# Patient Record
Sex: Female | Born: 1935 | Race: White | Hispanic: No | Smoking: Never smoker
Health system: Southern US, Community
[De-identification: ages and names within clinical notes are randomized; demographics above are authoritative.]

## PROBLEM LIST (undated history)

## (undated) DIAGNOSIS — I10 Essential (primary) hypertension: Secondary | ICD-10-CM

## (undated) DIAGNOSIS — I2510 Atherosclerotic heart disease of native coronary artery without angina pectoris: Secondary | ICD-10-CM

## (undated) DIAGNOSIS — H539 Unspecified visual disturbance: Secondary | ICD-10-CM

## (undated) DIAGNOSIS — I639 Cerebral infarction, unspecified: Secondary | ICD-10-CM

## (undated) DIAGNOSIS — T7840XA Allergy, unspecified, initial encounter: Secondary | ICD-10-CM

## (undated) DIAGNOSIS — D649 Anemia, unspecified: Secondary | ICD-10-CM

## (undated) DIAGNOSIS — H9190 Unspecified hearing loss, unspecified ear: Secondary | ICD-10-CM

## (undated) DIAGNOSIS — E039 Hypothyroidism, unspecified: Secondary | ICD-10-CM

## (undated) DIAGNOSIS — Z86010 Personal history of colonic polyps: Secondary | ICD-10-CM

## (undated) DIAGNOSIS — E119 Type 2 diabetes mellitus without complications: Secondary | ICD-10-CM

## (undated) DIAGNOSIS — E785 Hyperlipidemia, unspecified: Secondary | ICD-10-CM

## (undated) DIAGNOSIS — R251 Tremor, unspecified: Secondary | ICD-10-CM

## (undated) DIAGNOSIS — K219 Gastro-esophageal reflux disease without esophagitis: Secondary | ICD-10-CM

## (undated) HISTORY — PX: APPENDECTOMY: SHX54

## (undated) HISTORY — PX: CHOLECYSTECTOMY: SHX55

## (undated) HISTORY — DX: Essential (primary) hypertension: I10

## (undated) HISTORY — DX: Tremor, unspecified: R25.1

## (undated) HISTORY — DX: Unspecified visual disturbance: H53.9

## (undated) HISTORY — DX: Cerebral infarction, unspecified: I63.9

## (undated) HISTORY — DX: Anemia, unspecified: D64.9

## (undated) HISTORY — DX: Allergy, unspecified, initial encounter: T78.40XA

## (undated) HISTORY — DX: Personal history of colonic polyps: Z86.010

## (undated) HISTORY — PX: NECK SURGERY: SHX720

## (undated) HISTORY — PX: BACK SURGERY: SHX140

## (undated) HISTORY — PX: KNEE ARTHROSCOPY: SUR90

## (undated) HISTORY — DX: Type 2 diabetes mellitus without complications: E11.9

## (undated) HISTORY — DX: Gastro-esophageal reflux disease without esophagitis: K21.9

## (undated) HISTORY — PX: ABDOMINAL HYSTERECTOMY: SHX81

## (undated) HISTORY — PX: CARPAL TUNNEL RELEASE: SHX101

## (undated) HISTORY — DX: Unspecified hearing loss, unspecified ear: H91.90

## (undated) HISTORY — DX: Hyperlipidemia, unspecified: E78.5

---

## 1998-02-02 ENCOUNTER — Ambulatory Visit (HOSPITAL_COMMUNITY): Admission: RE | Admit: 1998-02-02 | Discharge: 1998-02-02 | Payer: Self-pay | Admitting: Family Medicine

## 1998-02-02 ENCOUNTER — Encounter: Payer: Self-pay | Admitting: Family Medicine

## 1998-11-26 ENCOUNTER — Emergency Department (HOSPITAL_COMMUNITY): Admission: EM | Admit: 1998-11-26 | Discharge: 1998-11-26 | Payer: Self-pay | Admitting: Emergency Medicine

## 1998-11-26 ENCOUNTER — Encounter: Payer: Self-pay | Admitting: Thoracic Surgery

## 1999-04-03 ENCOUNTER — Encounter: Payer: Self-pay | Admitting: Orthopedic Surgery

## 1999-04-03 ENCOUNTER — Inpatient Hospital Stay (HOSPITAL_COMMUNITY): Admission: RE | Admit: 1999-04-03 | Discharge: 1999-04-07 | Payer: Self-pay | Admitting: Orthopedic Surgery

## 1999-10-10 ENCOUNTER — Ambulatory Visit (HOSPITAL_COMMUNITY): Admission: RE | Admit: 1999-10-10 | Discharge: 1999-10-10 | Payer: Self-pay | Admitting: Family Medicine

## 1999-10-10 ENCOUNTER — Encounter: Payer: Self-pay | Admitting: Family Medicine

## 1999-12-06 ENCOUNTER — Ambulatory Visit (HOSPITAL_COMMUNITY): Admission: RE | Admit: 1999-12-06 | Discharge: 1999-12-06 | Payer: Self-pay | Admitting: Neurosurgery

## 1999-12-06 ENCOUNTER — Encounter: Payer: Self-pay | Admitting: Neurosurgery

## 1999-12-20 ENCOUNTER — Encounter: Payer: Self-pay | Admitting: Neurosurgery

## 1999-12-20 ENCOUNTER — Ambulatory Visit (HOSPITAL_COMMUNITY): Admission: RE | Admit: 1999-12-20 | Discharge: 1999-12-20 | Payer: Self-pay | Admitting: Neurosurgery

## 2000-01-03 ENCOUNTER — Encounter: Payer: Self-pay | Admitting: Neurosurgery

## 2000-01-03 ENCOUNTER — Ambulatory Visit (HOSPITAL_COMMUNITY): Admission: RE | Admit: 2000-01-03 | Discharge: 2000-01-03 | Payer: Self-pay | Admitting: Neurosurgery

## 2000-05-22 ENCOUNTER — Ambulatory Visit (HOSPITAL_BASED_OUTPATIENT_CLINIC_OR_DEPARTMENT_OTHER): Admission: RE | Admit: 2000-05-22 | Discharge: 2000-05-22 | Payer: Self-pay | Admitting: Orthopedic Surgery

## 2000-07-15 ENCOUNTER — Encounter: Admission: RE | Admit: 2000-07-15 | Discharge: 2000-08-15 | Payer: Self-pay | Admitting: Orthopedic Surgery

## 2000-10-25 ENCOUNTER — Ambulatory Visit (HOSPITAL_COMMUNITY): Admission: RE | Admit: 2000-10-25 | Discharge: 2000-10-25 | Payer: Self-pay

## 2000-10-25 ENCOUNTER — Encounter: Payer: Self-pay | Admitting: Family Medicine

## 2001-05-13 ENCOUNTER — Encounter: Payer: Self-pay | Admitting: Neurosurgery

## 2001-05-13 ENCOUNTER — Encounter: Admission: RE | Admit: 2001-05-13 | Discharge: 2001-05-13 | Payer: Self-pay | Admitting: Neurosurgery

## 2001-09-19 ENCOUNTER — Encounter: Payer: Self-pay | Admitting: Orthopedic Surgery

## 2001-09-19 ENCOUNTER — Emergency Department (HOSPITAL_COMMUNITY): Admission: EM | Admit: 2001-09-19 | Discharge: 2001-09-19 | Payer: Self-pay | Admitting: Emergency Medicine

## 2001-09-19 ENCOUNTER — Encounter: Payer: Self-pay | Admitting: Emergency Medicine

## 2001-11-19 ENCOUNTER — Ambulatory Visit (HOSPITAL_BASED_OUTPATIENT_CLINIC_OR_DEPARTMENT_OTHER): Admission: RE | Admit: 2001-11-19 | Discharge: 2001-11-19 | Payer: Self-pay | Admitting: *Deleted

## 2001-11-28 ENCOUNTER — Encounter: Payer: Self-pay | Admitting: Neurosurgery

## 2001-12-02 ENCOUNTER — Encounter: Payer: Self-pay | Admitting: Neurosurgery

## 2001-12-02 ENCOUNTER — Inpatient Hospital Stay (HOSPITAL_COMMUNITY): Admission: RE | Admit: 2001-12-02 | Discharge: 2001-12-06 | Payer: Self-pay | Admitting: Neurosurgery

## 2002-02-24 ENCOUNTER — Ambulatory Visit (HOSPITAL_BASED_OUTPATIENT_CLINIC_OR_DEPARTMENT_OTHER): Admission: RE | Admit: 2002-02-24 | Discharge: 2002-02-24 | Payer: Self-pay | Admitting: Urology

## 2003-02-07 ENCOUNTER — Emergency Department (HOSPITAL_COMMUNITY): Admission: AD | Admit: 2003-02-07 | Discharge: 2003-02-08 | Payer: Self-pay | Admitting: Emergency Medicine

## 2003-04-30 LAB — HM DEXA SCAN: HM Dexa Scan: NORMAL

## 2003-05-10 ENCOUNTER — Other Ambulatory Visit: Admission: RE | Admit: 2003-05-10 | Discharge: 2003-05-10 | Payer: Self-pay | Admitting: Family Medicine

## 2003-06-01 ENCOUNTER — Encounter: Admission: RE | Admit: 2003-06-01 | Discharge: 2003-07-13 | Payer: Self-pay | Admitting: Neurology

## 2003-10-10 ENCOUNTER — Emergency Department (HOSPITAL_COMMUNITY): Admission: EM | Admit: 2003-10-10 | Discharge: 2003-10-11 | Payer: Self-pay | Admitting: Emergency Medicine

## 2004-05-18 ENCOUNTER — Emergency Department (HOSPITAL_COMMUNITY): Admission: EM | Admit: 2004-05-18 | Discharge: 2004-05-18 | Payer: Self-pay | Admitting: Emergency Medicine

## 2004-09-05 ENCOUNTER — Ambulatory Visit (HOSPITAL_COMMUNITY): Admission: RE | Admit: 2004-09-05 | Discharge: 2004-09-05 | Payer: Self-pay | Admitting: Orthopedic Surgery

## 2004-09-07 ENCOUNTER — Ambulatory Visit (HOSPITAL_COMMUNITY): Admission: RE | Admit: 2004-09-07 | Discharge: 2004-09-07 | Payer: Self-pay | Admitting: Orthopedic Surgery

## 2004-09-20 ENCOUNTER — Ambulatory Visit (HOSPITAL_COMMUNITY): Admission: RE | Admit: 2004-09-20 | Discharge: 2004-09-20 | Payer: Self-pay | Admitting: Orthopedic Surgery

## 2004-09-20 ENCOUNTER — Ambulatory Visit (HOSPITAL_BASED_OUTPATIENT_CLINIC_OR_DEPARTMENT_OTHER): Admission: RE | Admit: 2004-09-20 | Discharge: 2004-09-20 | Payer: Self-pay | Admitting: Orthopedic Surgery

## 2004-10-04 ENCOUNTER — Ambulatory Visit (HOSPITAL_COMMUNITY): Admission: RE | Admit: 2004-10-04 | Discharge: 2004-10-04 | Payer: Self-pay | Admitting: Orthopedic Surgery

## 2004-11-15 ENCOUNTER — Ambulatory Visit: Admission: RE | Admit: 2004-11-15 | Discharge: 2004-11-15 | Payer: Self-pay | Admitting: Orthopedic Surgery

## 2004-11-20 ENCOUNTER — Encounter: Admission: RE | Admit: 2004-11-20 | Discharge: 2004-12-29 | Payer: Self-pay | Admitting: Orthopedic Surgery

## 2005-01-15 ENCOUNTER — Encounter: Admission: RE | Admit: 2005-01-15 | Discharge: 2005-01-15 | Payer: Self-pay | Admitting: Orthopedic Surgery

## 2005-02-19 ENCOUNTER — Inpatient Hospital Stay (HOSPITAL_COMMUNITY): Admission: RE | Admit: 2005-02-19 | Discharge: 2005-02-22 | Payer: Self-pay | Admitting: Orthopedic Surgery

## 2005-04-18 ENCOUNTER — Ambulatory Visit (HOSPITAL_BASED_OUTPATIENT_CLINIC_OR_DEPARTMENT_OTHER): Admission: RE | Admit: 2005-04-18 | Discharge: 2005-04-18 | Payer: Self-pay | Admitting: Orthopedic Surgery

## 2005-05-02 ENCOUNTER — Encounter: Admission: RE | Admit: 2005-05-02 | Discharge: 2005-05-21 | Payer: Self-pay | Admitting: Orthopedic Surgery

## 2005-06-12 ENCOUNTER — Encounter: Admission: RE | Admit: 2005-06-12 | Discharge: 2005-06-12 | Payer: Self-pay | Admitting: Orthopedic Surgery

## 2007-06-19 ENCOUNTER — Encounter: Admission: RE | Admit: 2007-06-19 | Discharge: 2007-06-19 | Payer: Self-pay | Admitting: Family Medicine

## 2007-10-07 ENCOUNTER — Encounter: Admission: RE | Admit: 2007-10-07 | Discharge: 2007-10-07 | Payer: Self-pay | Admitting: Orthopedic Surgery

## 2007-10-09 ENCOUNTER — Ambulatory Visit (HOSPITAL_BASED_OUTPATIENT_CLINIC_OR_DEPARTMENT_OTHER): Admission: RE | Admit: 2007-10-09 | Discharge: 2007-10-09 | Payer: Self-pay | Admitting: Orthopedic Surgery

## 2008-02-17 ENCOUNTER — Ambulatory Visit (HOSPITAL_BASED_OUTPATIENT_CLINIC_OR_DEPARTMENT_OTHER): Admission: RE | Admit: 2008-02-17 | Discharge: 2008-02-17 | Payer: Self-pay | Admitting: Orthopedic Surgery

## 2008-02-17 ENCOUNTER — Encounter (INDEPENDENT_AMBULATORY_CARE_PROVIDER_SITE_OTHER): Payer: Self-pay | Admitting: Orthopedic Surgery

## 2008-04-06 ENCOUNTER — Ambulatory Visit (HOSPITAL_COMMUNITY): Admission: RE | Admit: 2008-04-06 | Discharge: 2008-04-06 | Payer: Self-pay | Admitting: Neurosurgery

## 2008-10-08 ENCOUNTER — Encounter: Admission: RE | Admit: 2008-10-08 | Discharge: 2008-10-08 | Payer: Self-pay | Admitting: Family Medicine

## 2010-04-16 ENCOUNTER — Encounter: Payer: Self-pay | Admitting: Neurosurgery

## 2010-04-16 ENCOUNTER — Encounter: Payer: Self-pay | Admitting: Family Medicine

## 2010-06-30 LAB — HM PAP SMEAR

## 2010-07-03 ENCOUNTER — Encounter: Payer: Self-pay | Admitting: Nurse Practitioner

## 2010-07-03 DIAGNOSIS — E785 Hyperlipidemia, unspecified: Secondary | ICD-10-CM

## 2010-07-03 DIAGNOSIS — I10 Essential (primary) hypertension: Secondary | ICD-10-CM

## 2010-07-03 DIAGNOSIS — E1129 Type 2 diabetes mellitus with other diabetic kidney complication: Secondary | ICD-10-CM | POA: Insufficient documentation

## 2010-07-03 DIAGNOSIS — R251 Tremor, unspecified: Secondary | ICD-10-CM | POA: Insufficient documentation

## 2010-07-03 DIAGNOSIS — R32 Unspecified urinary incontinence: Secondary | ICD-10-CM | POA: Insufficient documentation

## 2010-07-03 DIAGNOSIS — E119 Type 2 diabetes mellitus without complications: Secondary | ICD-10-CM

## 2010-07-03 DIAGNOSIS — J309 Allergic rhinitis, unspecified: Secondary | ICD-10-CM | POA: Insufficient documentation

## 2010-07-03 DIAGNOSIS — K219 Gastro-esophageal reflux disease without esophagitis: Secondary | ICD-10-CM

## 2010-07-03 LAB — HM DIABETES FOOT EXAM: HM Diabetic Foot Exam: NORMAL

## 2010-08-08 NOTE — Op Note (Signed)
NAME:  Leah Levine, Leah Levine             ACCOUNT NO.:  [id number]   MEDICAL RECORD NO.:  [id number]          PATIENT TYPE:  AMB   LOCATION:  DSC                          FACILITY:  [facility]   PHYSICIAN:  Cindee Salt, M.D.       DATE OF BIRTH:  24-Dec-1932   DATE OF PROCEDURE:  DATE OF DISCHARGE:                               OPERATIVE REPORT   PREOPERATIVE DIAGNOSIS:  Mucoid cyst, left thumb.   POSTOPERATIVE DIAGNOSIS:  Mucoid cyst, left thumb.   OPERATIONS:  Excision cyst, debridement of IP joint, degenerative  arthritis, left thumb.   SURGEON:  Cindee Salt, MD   ASSISTANT:  RN   ANESTHESIA:  Forearm based IV regional.   DATE OF OPERATION:  February 17, 2008.   ANESTHESIOLOGISTJean Rosenthal.   HISTORY:  The patient is a 77 year old female with a history of a mass  over the dorsal-radial aspect of the IP joint of her left thumb.  She is  desirous of excision.  She is aware of risks and complications including  infection, recurrence, injury to arteries, nerves, tendons, incomplete  relief of symptoms, dystrophy, the possibility of recurrence, as long as  degenerative changes are present.  The cyst is small and does  transilluminate.  The patient is seen in the preoperative area.  The  extremity marked by both the patient and surgeon.  Antibiotic given.   PROCEDURE:  The patient was brought to the operating room where forearm  based IV regional anesthetic was carried out without difficulty under  the direction of Dr. Jean Rosenthal.  She was prepped using DuraPrep in supine  position, left arm free.  A time-out was taken.  A curvilinear incision  was made over the IP joint of the thumb, carried down through the  subcutaneous tissue.  The small cyst was immediately encountered.  This  was resected and sent to pathology, it was on the margin of the extensor  tendon.  The joint was opened.  Large osteophyte was present across the  entire dorsal aspect of the proximal phalanx.  This was removed with  a  small rongeur.  The joint was debrided.  Specimen was sent to pathology.  No further lesions were identified.  The wound was irrigated.  Skin  closed with interrupted 5-0 Vicryl Rapide sutures.  A sterile  compressive dressing and splint to the thumb applied.  The patient  tolerated the procedure well and was taken to the recovery room for  observation in satisfactory condition.  She will be discharged home to  return to the [facility] in [place] in 1 week on Vicodin.           ______________________________  Cindee Salt, M.D.     GK/MEDQ  D:  02/17/2008  T:  02/18/2008  Job:  [id number]   cc:   Ernestina Penna, M.D.

## 2010-08-08 NOTE — Op Note (Signed)
NAME:  Leah Levine, Leah Levine             ACCOUNT NO.:  [id number]   MEDICAL RECORD NO.:  [id number]          PATIENT TYPE:  AMB   LOCATION:  DSC                          FACILITY:  [facility]   PHYSICIAN:  Cindee Salt, M.D.       DATE OF BIRTH:  16-Dec-1932   DATE OF PROCEDURE:  10/09/2007  DATE OF DISCHARGE:                               OPERATIVE REPORT   PREOPERATIVE DIAGNOSIS:  Stenosing tenosynovitis, left thumb.   POSTOPERATIVE DIAGNOSIS:  Stenosing tenosynovitis, left thumb.   OPERATION:  Release of A1 pulley, left thumb.   SURGEON:  Cindee Salt, MD   ASSISTANT:  Joaquin Courts, RN   ANESTHESIA:  Forearm-based IV regional.   ANESTHESIOLOGIST:  Bedelia Person, MD   HISTORY:  The patient is a 77 year old female with a history of  triggering of her left thumb.  She has had multiple digits released in  the past.  She is desirous of conservative treatment and that this has  not worked in the past for her.  She is desirous of proceeding to  surgical release.   Postoperative course has been discussed along with the risks and  complications.  She is aware there is no guarantee with the surgery;  possibility of infection; recurrence injury to arteries, nerves,  tendons; incomplete relief of symptoms; and dystrophy.  In the  preoperative area, the patient is seen.  The extremity marked by both  the patient and surgeon.  Antibiotic given.   PROCEDURE:  The patient was brought to the operating room where a  forearm-based IV regional anesthetic was carried out without difficulty.  She was prepped using DuraPrep, supine position, left arm free.  A time-  out was taken.  A transverse incision was made over the A1 pulley of the  left thumb and carried down through the subcutaneous tissue.  Neurovascular structures were protected.  A1 pulley was identified.  This was released in its radial aspect, thumb placed through full range  motion.  No further triggering was evident.  The wound was irrigated.  The  skin was closed with interrupted 5-0 Vicryl Rapide sutures.  Sterile  compressive dressing in thumb, and no splint was applied.  The patient  tolerated the procedure well and was taken to the recovery room for  observation in satisfactory condition.  She will be discharged home to  return to the hand center of [facility] in 1 week on Vicodin.           ______________________________  Cindee Salt, M.D.    GK/MEDQ  D:  10/09/2007  T:  10/10/2007  Job:  [id number]
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
## 2010-08-11 NOTE — Discharge Summary (Signed)
[place]  Patient:    Leah Levine                     MRN: [id number] Adm. Date:  [id number] Disc. Date: [id number] Attending:  Georgena Levine Dictator:   Leah Levine, P.A. CC:         Leah Levine, M.D., [place], [place] [phone])                           Discharge Summary  ADMISSION DIAGNOSES: 1. End-stage osteoarthritis of the right knee. 2. Hypothyroidism. 3. Urinary frequency and urgency. 4. Hypercholesterolemia. 5. History of bilateral hand tremors.  DISCHARGE DIAGNOSES: 1. End-stage osteoarthritis of the right knee. 2. Hypothyroidism. 3. Urinary frequency and urgency. 4. Hypercholesterolemia. 5. History of bilateral hand tremors.  PROCEDURES:  On April 03, 1999, Leah Levine underwent a right total knee arthroplasty by Leah Levine. Lucey.  COMPLICATIONS:  None.  CONSULTATIONS: 1. Pharmacy consult for Coumadin therapy April 03, 1999. 2. Case management, physical therapy, and rehabilitation medicine consult April 04, 1999. 3. Occupational therapy consult April 05, 1999.  HISTORY OF PRESENT ILLNESS:  This 77 year old white female presented to Leah Levine with a one-year history of progressively worsening right knee pain.  The pain is constant and described as a dull ache, becoming sharp and knife-like at times. It increases with prolonged standing or weightbearing and decreases with resting or elevation.  The knee does swell at times, and does have a catching sensation, and she has had no relief of pain with a right knee arthroscopy by Leah Levine in September 2000.  The pain has continued to get worse, and has been unrelieved by conservative management, so she is presenting for a right total knee arthroplasty at this time.  HOSPITAL COURSE:  Leah Levine tolerated her surgical procedure well, without immediate postoperative complications.  She was subsequently transferred to 4700. On postoperative day #1,  she was afebrile with her vital signs stable. Dressing was intact to her right knee with minimal drainage.  Her Hemovac was discontinued. She was started on PT per protocol.  Her hemoglobin was 11.8 with hematocrit of 34. Epidural catheter was treating her pain well.  On postoperative day #2, she continued to do well.  Her t-max was 100.8, and her vital signs were stable.  Her right knee incision was well approximated with staples.  No erythema or drainage noted.  Negative Homans sign.  Her epidural was discontinued, and she was started on p.o. pain medications, and continued on PT per protocol.  She continued to make good progress over the next several days, with her hemoglobin and hematocrit remaining stable.  Her t-max on April 07, 1999, was  101.7.  She was afebrile that morning.  At that time, she was believed to be ready for discharge home but, because of the fever, a urinalysis was obtained at that  time to rule out a urinary tract infection.  That was negative.  Because of this, she was able to go home later that day.  DISCHARGE INSTRUCTIONS: 1. She was to resume all prehospitalization medications and diet. 2. She was also given OxyContin 10 mg 1 tablet p.o. q.12h., OxyIR p.r.n. for    breakthrough pain, and Coumadin with the dose to be determined by pharmacy. 3. She was to be weightbearing as tolerated on the right leg with use of the    walker. 4. She  was to follow up with Leah Levine in our office next week, and was to call    [phone] for that appointment. 5. She was arranged for home health physical therapy and an R.N. to draw her    pro times for Coumadin therapy. 6. She was to notify Leah Levine of a temperature greater than or equal to 101.5,  chills, pain unrelieved by pain medications, or foul-smelling drainage from the    wound.  She stated good understanding of these instructions and was subsequently discharged to home.  LABORATORY DATA:  Chest x-ray  taken on April 03, 1999, showed a small nodule within the left mid to upper lung zone which is stable when compared with a 1998 study.  It is consistent with a granuloma.  There is no active chest disease noted. Postoperative x-ray of her right knee on April 03, 1999, showed anatomic alignment status post the right knee arthroplasty.  On March 30, 1999, her white count was 6.6 with a hemoglobin of 13.2, hematocrit of 38.3.  On April 04, 1999, her white count was 11.4 with a hemoglobin of 11.8, hematocrit of 34.  On April 05, 1999, her white count was 9.9 with a hemoglobin of 10.7 and hematocrit of 31.5.  On April 06, 1999, her hemoglobin was 10.2 with hematocrit of 29.7.  On March 30, 1999, her PT was 14 seconds with an INR of 1.2 and a PTT of 26. n April 07, 1999, her PT was 19.6 seconds with an INR of 2.2.  Urinalysis done n April 07, 1999, showed 1 mg/dl of urobilinogen, trace leukocyte esterase, and  many epithelial cells.  All other indices were within normal limits. DD:  05/09/99 TD:  05/09/99 Job: [id number]

## 2010-08-11 NOTE — Op Note (Signed)
NAME:  Leah Levine, Leah Levine             ACCOUNT NO.:  [id number]   MEDICAL RECORD NO.:  [id number]          PATIENT TYPE:  AMB   LOCATION:  DSC                          FACILITY:  [facility]   PHYSICIAN:  Mila Homer. Sherlean Foot, M.D. DATE OF BIRTH:  29-Mar-1932   DATE OF PROCEDURE:  09/20/2004  DATE OF DISCHARGE:                                 OPERATIVE REPORT   SURGEON:  Mila Homer. Sherlean Foot, M.D.   ASSISTANT:  None.   ANESTHESIA:  General.   PREOPERATIVE DIAGNOSIS:  Left knee medial meniscus tear and osteoarthritis.   POSTOPERATIVE DIAGNOSIS:  Left knee medial meniscus tear and osteoarthritis.   PROCEDURE:  Left knee arthroscopy with partial medial meniscectomy and  chondroplasty of the patellofemoral and medial compartments.   INDICATIONS FOR PROCEDURE:  The patient is a 77 year old white female with  failure of conservative measures plus mechanical symptoms and MRI evidence  of a meniscus tear. Informed consent was obtained.   DESCRIPTION OF PROCEDURE:  The patient was laid supine, administered general  anesthesia. The left lower extremity was prepped and draped in the usual  sterile fashion. Inferolateral and inferomedial portals were created with a  #11 blade, blunt trocar and cannula. Diagnostic arthroscopy revealed some  grade 3 changes of the patella centrally and in the trochlea. I did a  chondroplasty of both areas with a 4.2 mm great white shaver. I then went  into the medial compartment and there was a large radial posterior horn  tear. Straight basket forceps and a great white shaver were used to perform  an aggressive posterior partial medial meniscectomy. The lateral compartment  was normal except for some grade 3 arthritis on the tibia. I did a  chondroplasty here as well. The ACL and PCL appeared normal. I then  irrigated and closed with interrupted 4-0 nylon sutures.   DRESSINGS:  Sponges, sterile Webril and Ace wrap.   COMPLICATIONS:  None.   DRAINS:  None.       SDL/MEDQ  D:  09/20/2004  T:  09/20/2004  Job:  [id number]

## 2010-08-11 NOTE — Op Note (Signed)
[place]  Patient:    Leah Levine                     MRN: [id number] Proc. Date: 04/03/99 Adm. Date:  [id number] Attending:  Georgena Spurling CC:         Anesthesia Department                           Operative Report  PREOPERATIVE DIAGNOSIS:  Degenerative joint disease of the knee.  POSTOPERATIVE DIAGNOSIS:  Degenerative joint disease of the knee.  OPERATIVE PROCEDURE:  Total knee replacement performed by Georgena Spurling, M.D.  SURGEON:  Burna Forts, M.D.  ASSISTANT:  INDICATIONS:  Consultation for placement of the epidural catheter for postoperative analgesia.  Preoperatively the risks and benefits of placement of the epidural catheter for postoperative analgesia were discussed in detail with the patient,  including risk of bleeding, infection, nerve injury, or other.  The patient also desired to have a regional technique.  Therefore, we plan a combination technique. Spinal anesthetic would be started at the beginning of the case following by epidural insertion for postoperative analgesia.  She also consented to this.  DESCRIPTION OF PROCEDURE:  The patient was taken to the operating room, turned n the right side, and sedation begun.  Local anesthesia was used.  The spinal anesthetic was obtained at the L4-5 level followed by an epidural placement at he L3-4 level without any problems with a loss of resistance technique and the catheter threaded with ease.  The surgery was maintained throughout and for a sufficient period of time with the spinal anesthetic.  We will follow the patient daily by the department of anesthesiology for postoperative analgesia with the epidural catheter.  COMPLICATIONS:  None.  DISPOSITION:  The patient will be followed daily by the department of anesthesiology for postoperative analgesia via epidural catheter. DD:  04/03/99 TD:  04/03/99 Job: [id number]

## 2010-08-11 NOTE — Op Note (Signed)
[facility]  Patient:    Leah Levine, Leah Levine                       MRN: [id number] Proc. Date: 05/22/00 Adm. Date:  [id number] Attending:  Georgena Spurling                           Operative Report  PREOPERATIVE DIAGNOSIS:  Left knee osteoarthritis.  POSTOPERATIVE DIAGNOSIS:  Left knee osteoarthritis.  PROCEDURE:  Left knee arthroscopy with chondroplasty of the medial and patellofemoral compartments and microfracture of the medial compartment.  SURGEON:  Georgena Spurling, M.D.  ANESTHESIA:  MAC.  INDICATION FOR PROCEDURE:  Patient is a 77 year old white female with osteoarthritis of the knee in spite of conservative treatment and mechanical symptoms.  After informed consent was obtained, she was taken to the operating room.  DESCRIPTION OF PROCEDURE:  The patient was laid supine after administering a knee block.  The left lower extremity was prepped and draped in the usual sterile fashion.  Inferolateral and inferomedial portals were created with a #11 blade, blunt trocar, and cannula.  Diagnostic arthroscopy was performed, revealing grade 4 chondromalacia of the patellofemoral joint as well as a large medial patellofemoral plica.  There was grade 4 chondromalacia on the medial femoral condyle, tibial plateau, and some grade 4 changes in the anterior portion of the lateral tibial plateau.  The menisci were, in fact, normal.  With a Great White shaver in the inferomedial portal, a chondroplasty of the medial compartment and the patellofemoral compartment were done as well as a debridement of the medial plica.  We then used the microfracture picks to pick the medial femoral condylar lesion approximately eight times down to bleeding bone.  We then used the Automatic Data shaver to remove any cartilaginous debris.  We lavaged the joint, removed the fluid and instrumentation, and closed the portals with interrupted 4-0 nylon sutures, dressed with Xeroform,  dressing sponges, sterile Webril, and Ace wrap.  TOURNIQUET:  None.  COMPLICATIONS:  None.  DRAINS:  None. DD:  05/22/00 TD:  05/23/00 Job: [id number]

## 2010-08-11 NOTE — Op Note (Signed)
NAME:  Leah Levine, Leah Levine             ACCOUNT NO.:  [id number]   MEDICAL RECORD NO.:  [id number]          PATIENT TYPE:  AMB   LOCATION:  DSC                          FACILITY:  [facility]   PHYSICIAN:  Mila Homer. Sherlean Foot, M.D. DATE OF BIRTH:  03-19-1933   DATE OF PROCEDURE:  04/18/2005  DATE OF DISCHARGE:                                 OPERATIVE REPORT   PREOPERATIVE DIAGNOSIS:  Arthrofibrosis status post total knee arthroplasty.   SURGEON:  Mila Homer. Sherlean Foot, M.D.   ASSISTANT:  None.   ANESTHESIA:  General mask plus femoral nerve block.   INDICATIONS FOR PROCEDURE:  Patient is seven weeks status post left total  knee arthroplasty.  She ended up having difficulty after therapy and the  knee began to freeze up.  She then consented for manipulation.  Informed  consent was obtained.   DESCRIPTION OF PROCEDURE:  Patient was layed supine and administered mask  anesthesia.  Left leg was manipulated from 0 to approximately 50 degrees to  0 to 120 degrees.  This was very, very easy and the manipulation was supple.  She had good drop and dangle back to 115 to 120.  I then infiltrated a Depo-  Medrol/Marcaine mixture and the anesthesiologist placed a femoral nerve  block.  Patient tolerated the procedure well.   COMPLICATIONS:  None.   DRAINS:  None.           ______________________________  Mila Homer. Sherlean Foot, M.D.     SDL/MEDQ  D:  04/18/2005  T:  04/18/2005  Job:  [id number]

## 2010-08-11 NOTE — Op Note (Signed)
[place]  Patient:    Leah Levine                     MRN: [id number] Proc. Date: 04/03/99 Adm. Date:  [id number] Attending:  Georgena Spurling                           Operative Report  DATE OF BIRTH:  November 05, 1935.  PREOPERATIVE DIAGNOSIS:  Right knee osteoarthritis.  POSTOPERATIVE DIAGNOSIS:  Right knee osteoarthritis.  OPERATION:  Right total knee replacement.  SURGEON:  Georgena Spurling, M.D.  ASSISTANT:  Arnoldo Morale, P.A.  ANESTHESIA:  Epidural.  INDICATION:  The patient is a 77 year old white female who underwent arthroscopy for cartilage tears and severe osteoarthritis of the medial compartment approximately four months ago and has very little relief of pain due to the arthritis.  Conservative treatment failed and she understood the risks versus benefits of Neer replacement surgery and wishes to proceed.  DESCRIPTION OF PROCEDURE:  The patient was taken to the inpatient operating room, placed in the decubitus position, and underwent epidural anesthesia.  The patient was then placed supine and a tourniquet was preapplied but not inflated.  The right lower extremity was prepped and draped in the usual sterile fashion.  The extremity was then exsanguinated and the tourniquet was inflated for two hours at 350 mmHg.  At this point, a midline parapatellar incision was made with sharp dissection continuing down to the retinaculum, quadriceps, and patella tendon.  A straight  paramedian parapatellar incision was made and the knee joint was entered.  A synovectomy was performed.  The patella was everted and measured at 23 mm.  The  template was noted to be 29 mm diameter and reamer was used to ream it down 8 mm and then the template was used to drill three lug holes and a trial was placed.  This measured 23 mm as well.  At this point, the knee was brought into flexion with the patella everted and retractors protecting the  MCL and LCL ligaments.  The tibia was cut with a standard cutting jig in 3 degrees of flexion.  Once this was performed, an intramedullary drill was used to enter the femoral canal and the distal femoral cutting guide as set on 6 degrees right for a 6 degree valgus cut and down into the femur and the distal cutting guide was placed on the anterior femur and pinned into place. The intramedullary guide was removed and the sagittal saw was used to make a distal  femoral cut.  These measured 11 mm respectively.  At this point, the epicondyles were identified and the epicondylar axis was drawn. The posterior condylar angled measuring device was used to measure the posterior condyle angle at 3 degrees.  The sizer was then placed on the distal end of the  femur and D was the most appropriate size and this was pinned into place at the 3 degree slot representing the posterior condylar angle.  The 4:1 cutting guide was then placed on the end of the femur and the anterior,  posterior, as well as the chamfer cuts were made.  At this point, a 10 mm spacer block was attempted to be placed on the knee in 90 degrees of flexion and was still tight and was tied in extension as well.  Some 2 mm of tibia were removed.  At this point, the 10 mm spacer  block fit snugly but there was some tightness on the medial side.  Further medial exposure and release were performed.  At this point, the flexor and extension guide were balanced with a 10 mm spacer in place.  At this point, the finishing block of the femur was put on and the box with the PCL substitution was cut.  The tibia was then prepared with the keyhole punch. Trial components were put in place and a 10 to 12 mm polyethylene trials were trialed and the 10 mm was felt to give adequate range of motion with good stability.  The patella component was placed and tracking was evaluated with the no-thumbs technique and tracked very, very  well.  At this point, all the components were removed.  Copious irrigation was performed with the pulse lavage system.  We then mixed two batches of cement and placed the femur component, size D on first, and then the tibial component size 3.  We then placed a 10 mm polyethylene and a patella button, located the knee, removed excess cement, and when the cement was hard let the tourniquet down.  After cauterizing bleeding vessels, the knee was closed with Hemovac left deep to the wound, deep in the joint.  The parapatellar incision was closed with interrupted figure-of-eight #1 Vicryl sutures, the deep soft tissues with interrupted 0 Vicryl sutures, and the subcuticular 2-0 Vicryl stitch.  Skin staples were then used to close the skin nd the wound was dressed with Xeroform, orthopedic dressing, sponges, two ABDs, one single layer of Webril, and a thigh high TED hose.  The patient tolerated the procedure well.  Tourniquet time 90 minutes.  COMPLICATIONS:  None.  DRAINS:  None. DD:  04/06/99 TD:  04/06/99 Job: [id number]

## 2010-08-11 NOTE — H&P (Signed)
NAME:  Leah Levine, Leah Levine NO.:  [id number]   MEDICAL RECORD NO.:  [id number]                   PATIENT TYPE:  INP   LOCATION:  3013                                 FACILITY:  [facility]   PHYSICIAN:  Tanya Nones. Jeral Fruit, MD               DATE OF BIRTH:  1933/01/27   DATE OF ADMISSION:  12/02/2001  DATE OF DISCHARGE:                                HISTORY & PHYSICAL   HISTORY:  Leah Levine is a lady who was seen by me initially about two  years ago because of back and right leg pain.  The patient underwent surgery  on the right knee with prosthesis, but the pain continued to get worse.  She  had conservative treatment, and then she never came back until about a few  weeks ago when she told me that the pain was getting worse, was going  posterolaterally to the right hip, and she was quite miserable.  She was  having some problem with the left leg but that the pain, according to her,  was worse on the right side, and it was getting worse with activity.  With  the x-ray, because of the findings, she wants to go ahead with surgery.   PAST MEDICAL HISTORY:  She had a hysterectomy, cholecystectomy, and knee  replacement, and posterior cervical fusion in 1984.   ALLERGIES:  She is allergic to CHLOROFORM.   FAMILY HISTORY:  Unremarkable.   SOCIAL HISTORY:  Unremarkable.   REVIEW OF SYSTEMS:  Significant for a continuous back pain, neck pain,  thyroid disease.   PHYSICAL EXAMINATION:  GENERAL:  The patient came to my office with her  husband.  She has difficulty sitting or standing.  EAR, NOSE, AND THROAT:  Normal.  NECK:  She has a scar from previous surgery.  She has good flexibility.  LUNGS:  Clear.  HEART SOUNDS:  No murmur, normal.  EXTREMITIES:  A scar on the knee from previous surgery.  NEUROLOGIC:  Normal mental status, __________.  Strength 5/5 except I found  weakness with dorsiflexion of both feet, the right is worse than the left  one.  Sensation:   She complains of numbness on the top of the right foot.  External ________ in the right side is positive at about 30 degrees, and the  left side is about 60 degrees.   Lumbar spine x-rays shows that she has some degenerative disk disease with 3  mm step-off between 4 and 5 which gets better with extension and worse with  flexion.  The MRI showed lumbar stenosis at the left of L4-5 with  degenerative disk disease.   CLINICAL IMPRESSION:  1. L4-L5 spondylolisthesis without L5 radiculopathy.  2. Lumbar stenosis.  3. Degenerative disk disease.   RECOMMENDATIONS:  The patient was given two choices about doing laminotomies  knowing if the L4-5 is getting worse.  The other one was to proceed with a  fusion.  The patient went home to talk to her physician, and she  decided to go ahead with a back fusion.  The risks were explained such as no  improvement whatsoever, collapse of the bone graft, need for further  surgery, and damage to the rest of the abdomen, bleeding, infection, and no  improvement whatsoever concerning the pain.                                               Tanya Nones. Jeral Fruit, MD    EMB/MEDQ  D:  12/02/2001  T:  12/03/2001  Job:  [id number]

## 2010-08-11 NOTE — Op Note (Signed)
NAME:  Leah Levine, BAMBRICK NO.:  [id number]   MEDICAL RECORD NO.:  [id number]                   PATIENT TYPE:  INP   LOCATION:  3013                                 FACILITY:  [facility]   PHYSICIAN:  Tanya Nones. Jeral Fruit, MD               DATE OF BIRTH:  [birth year]   DATE OF PROCEDURE:  12/02/2001  DATE OF DISCHARGE:                                 OPERATIVE REPORT   PREOPERATIVE DIAGNOSES:  Lumbar stenosis L5 with spondylolisthesis and  chronic L5 radiculopathy.   POSTOPERATIVE DIAGNOSES:  Lumbar stenosis L5 with spondylolisthesis and  chronic L5 radiculopathy.   OPERATION PERFORMED:  Bilateral L4-5 diskectomy, facetectomy, interbody  fusion, pedicle screw for L4 to L5 and posterolateral fusion.  Foraminotomy  to decompress the L4 and L5 nerve root.  C-arm.  Cell Saver.   SURGEON:  Tanya Nones. Jeral Fruit, MD   ASSISTANT:  Payton Doughty, M.D.   ANESTHESIA:   INDICATIONS FOR PROCEDURE:  The patient was admitted because of back pain  which got worse with activity.  The pain is both legs but the right is worse  than the left one.  X-ray showed spondylolisthesis in between 4-5.  Surgery  was advised and the risks were explained in the history and physical.   DESCRIPTION OF PROCEDURE:  The patient was taken to the operating room and  she was positioned in prone manner.  The back was prepped with Betadine.  The C-arm x-ray machine extension was broken.  Drapes were applied.  Midline  incision from L5-S1 to L4 was made.  Muscle and fascia were retracted  laterally until we found the facet of 4 and 5.  Then because of the lumbar  stenosis, we proceed with removal of spinal stenosis at. L5 and the lamina  of L5 bilaterally.  The L4-L5 facet was also removed.  The patient had quite  a bit of adhesions  the L4-5 especially on the right side and lysis was  accomplished.  Then we entered the disk space and total gross bilateral  diskectomy was achieved.  Then with the  C-arm we entered into the disk  space.  With a curet we curetted the end plates.  Then two pieces of  allograft of 10 mm and 26 were inserted bilaterally.  Medially and  posterolaterally, we used Vitoss as well as autograft.  This mixture was  used to fill up the space in order to place between the allograft.  Having  done this, we identified the pedicle screw of L4-L5.  Pedicle probe was  inserted, following the C-arm.  We had good position of all pedicle probes  at level L4-L5.  Then we introduced a 4-0 pedicle screws which were  corrected with a rod.  We went laterally and we found the lateral aspect of  the facet as well as the transverse process 4-5.  This was drilled and using  a mixture  of Vitoss as well as the allograft, the area was packed  bilaterally.  Then the whole area was irrigated.  AP and lateral x-rays of  the spine using C-arm with extension was within normal limits with the  pedicle screw in good position.  From then on, fat was left to cover the  dura mater and the wound was closed with Vicryl and Steri-Strips.  The  patient did well.                                                 Tanya Nones. Jeral Fruit, MD   EMB/MEDQ  D:  12/02/2001  T:  12/03/2001  Job:  [id number]

## 2010-08-11 NOTE — Op Note (Signed)
NAME:  Leah Levine, Leah Levine             ACCOUNT NO.:  [id number]   MEDICAL RECORD NO.:  [id number]          PATIENT TYPE:  AMB   LOCATION:                               FACILITY:  [facility]   PHYSICIAN:  Mila Homer. Sherlean Foot, M.D. DATE OF BIRTH:  05/25/1932   DATE OF PROCEDURE:  09/20/2004  DATE OF DISCHARGE:  09/20/2004                                 OPERATIVE REPORT   PREOPERATIVE DIAGNOSIS:  Left knee medial meniscal tear and osteoarthritis.   POSTOPERATIVE DIAGNOSIS:  Left knee medial meniscal tear and osteoarthritis.   PROCEDURE:  Left knee arthroscopy with partial medial meniscectomy and  chondroplasty.   SURGEON:  Mila Homer. Sherlean Foot, M.D.   ASSISTANT:  None.   ANESTHESIA:  MAC.   INDICATIONS FOR PROCEDURE:  Patient is a 77 year old with failure of  conservative measures and mechanical symptoms in the knee as well as an MRI  scan showing meniscus.   DESCRIPTION OF PROCEDURE:  Patient was laid supine and administered and  administered MAC anesthesia.  Left lower extremity was prepped and draped in  the usual sterile fashion.  Inferolateral and inferomedial portals were  created with a #11 blade with trocar in the  cannula.  Diagnostic  arthroscopy revealed grade II and III chondromalacia in the patellofemoral  joint as well as medial compartment.  Great white shaver was used to perform  a chondroplasty in these two areas.  On in the medial compartment into the  posterior aspect of the knee and found a complex posterior horn medial  meniscal tear.  An aggressive partial medial posterior meniscectomy was  performed with a great white shaver and straight basket forceps.  Lateral  compartment had only grade I chondromalacia.  The ACL and PCL were normal.  The knee was then lavaged and wound was closed with 4-0 nylon sutures,  dressed with Adaptic, 4x4s, sterile Webril and Ace wrap.   COMPLICATIONS:  None.   DRAINS:  None.           ______________________________  Mila Homer. Sherlean Foot,  M.D.     SDL/MEDQ  D:  10/16/2004  T:  10/16/2004  Job:  [id number]

## 2010-08-11 NOTE — Op Note (Signed)
NAME:  Leah Levine, WOHLFORD             ACCOUNT NO.:  [id number]   MEDICAL RECORD NO.:  [id number]          PATIENT TYPE:  INP   LOCATION:  2550                         FACILITY:  [facility]   PHYSICIAN:  Mila Homer. Sherlean Foot, M.D. DATE OF BIRTH:  1933/02/13   DATE OF PROCEDURE:  02/18/2005  DATE OF DISCHARGE:                                 OPERATIVE REPORT   SURGEON:  Mila Homer. Sherlean Foot, M.D.   ASSISTANTArlys John D. Petrarca, P.A.-C.   ANESTHESIA:  General plus preoperative femoral nerve block.   PREOPERATIVE DIAGNOSIS:  Left knee osteoarthritis.   POSTOPERATIVE DIAGNOSIS:  Left knee osteoarthritis.   PROCEDURE:  Left total knee arthroplasty.   INDICATION FOR PROCEDURE:  The patient is a 77 year old white female who has  failed conservative measures for osteoarthritis of her left knee.  Informed  consent was obtained.   DESCRIPTION OF PROCEDURE:  The patient was laid supine and administered  general anesthesia.  The left lower extremity was prepped and draped in the  usual sterile fashion.  A #10 blade was used to make a midline incision  approximately 8 cm in length.  A fresh blade was used to make a median  parapatellar arthrotomy.  Then, I performed a synovectomy.  I then elevate  the deep MCL off the median crest of the tibia.  I then everted the patella  and measured it at 20 mm thick.  I reamed down to 12 mm and then used a 29-  mm reamer to do so.  I then used a 29-mm template, drilled three holes, and  a 29 trial in place, it also measured 20 mm thick.  I removed the prosthetic  trial.  I went into flexion.  I used the extramedullary alignment guide to  set up our tibial cut.  The tibial cut was made perpendicular to the  anatomic axis removing 2 mm of bone off the medial tibial plateau which was  the low side.  I then made an intramedullary drill hole in the femur.  I  placed an intramedullary hole set on a 6-degree valgus cut and made the  distal femoral cut.  I then marked out  the epicondylar axis.  The posterior  condylar angle measured 3 degrees.  I sized to a size 8 pin through a 3-  degree external rotation through the 3-degree external rotation holes.  I  then pinned the four-in-one cutting block into place.  I made the anterior,  posterior, and chamfer cuts.  I then removed the cut pieces of the bone.  I  then placed the laminar spreader in the knee and removed the ACL, PCL, and  medial and lateral menisci.  I removed the posterior condylar osteophytes  with a curved osteotome and rongeur.  I then used a 10-mm spacer block, and  I got good extension and flexion gap balance.  It was a little bit tight, so  I removed 2 more mm of bone off the tibia, and this made everything perfect.  I then used the size 3 tibial tray drilling the keels to finish the tibia.  I used  the size E finishing block drilling the keels and cutting the blocks.  I then trialed with a size 3 tibial tray, a size E femoral component, a 10-  mm insert, and a 29-mm patella.  Flexion and extension gap balance was  excellent.  The angle was back to 135 degrees.  Patellar tracking was  excellent.  I then removed the trial components and copiously irrigated.  I  cemented in the tibia first, the femur second, removed excess cement, and  then snapped in the real polyethylene.  I then placed the leg in extension  and cemented in the patellar component.  I then placed the Hemovac deep to  the arthrotomy coming out superolaterally, and when the cement  was hardened, I left the tourniquet down, cauterized bleeding vessels,  irrigated copiously, and then I closed the arthrotomy with interrupted #1  Vicryl suture, the deep soft tissues with interrupted 0 Vicryl sutures, and  then a subcuticular 2-0 Vicryl stitch.           ______________________________  Mila Homer. Sherlean Foot, M.D.     SDL/MEDQ  D:  02/19/2005  T:  02/19/2005  Job:  [id number]

## 2010-08-11 NOTE — Discharge Summary (Signed)
   NAME:  Leah Levine, Leah Levine NO.:  [id number]   MEDICAL RECORD NO.:  [id number]                   PATIENT TYPE:  INP   LOCATION:  3013                                 FACILITY:  [facility]   PHYSICIAN:  Tanya Nones. Jeral Fruit, MD               DATE OF BIRTH:  [birth year]   DATE OF ADMISSION:  12/02/2001  DATE OF DISCHARGE:  12/06/2001                                 DISCHARGE SUMMARY   ADMISSION DIAGNOSIS:  L4-L5 spondylolisthesis.   FINAL DIAGNOSIS:  L4-L5 spondylolisthesis.   CLINICAL HISTORY:  The patient was admitted because of back pain with  radiation to the legs.  X-rays showed that she had a herniated disk with  spondylolisthesis at the level of 4-5; surgery was advised.   LABORATORY DATA:  Laboratory normal.   COURSE IN THE HOSPITAL:  The patient was taken to surgery and a bilateral 4-  5 diskectomy with interbody fusion and pedicle screw were done.  Today, she  is feeling much better and she is being discharged today.   CONDITION ON DISCHARGE:  Improving.   MEDICATIONS:  Percocet, Diazepam.   DIET:  Regular.   ACTIVITY:  Not to drive for at least three weeks.   FOLLOWUP:  She will be seen by me in three weeks.                                               Tanya Nones. Jeral Fruit, MD    EMB/MEDQ  D:  12/06/2001  T:  12/08/2001  Job:  [id number]

## 2010-08-11 NOTE — Discharge Summary (Signed)
NAME:  Leah Levine, Leah Levine             ACCOUNT NO.:  [id number]   MEDICAL RECORD NO.:  [id number]          PATIENT TYPE:  INP   LOCATION:  5018                         FACILITY:  [facility]   PHYSICIAN:  Mila Homer. Sherlean Foot, M.D. DATE OF BIRTH:  January 25, 1933   DATE OF ADMISSION:  02/19/2005  DATE OF DISCHARGE:  02/22/2005                                 DISCHARGE SUMMARY   ADMISSION DIAGNOSIS:  Advanced degenerative joint disease of the left knee.   DISCHARGE DIAGNOSES:  1.  Advanced degenerative joint disease of the left knee.  2.  History of hyperthyroidism.  3.  Hypoxia.  4.  Diabetes mellitus.  5.  Urinary frequency.  6.  Bilateral hand tremors.   PROCEDURE:  Left total knee arthroplasty.   HISTORY:  A 77 year old white female presented with a one-year history of  gradual onset of progressive left knee pain.  She has a history of right  knee replacement in 2001 and has done well.  At this point, the left knee  pain is a constant sharp pain over the medial joint line without radiation.  Increases with weight bearing and decreases with rest.  The knee does catch  at times.  It feels like it grinds.  It gives way also.  It swells and keeps  her up at night.  There is no popping or locking noted.  Currently taking  Vicodin for the pain.  She ambulates with the use of a cane.  Admit at this  time for total joint replacements.   HOSPITAL COURSE:  A 77 year old female admitted February 19, 2005 after  appropriate laboratory studies were obtained, as well as 1 g of Ancef IV on  call in the operating room and was taken to the operating room where she  underwent a left total knee replacement.  She tolerated the procedure well.  She was placed on a Dilaudid PCA pump postoperatively.  Ancef 1 g IV q.8h.  was continue postoperatively.  Lovenox 30 mg subcu q.12h. was begun on  February 20, 2005.  Consultations with PT and Care Management was performed.  CPM zero to 90 degrees was also started.  She  was allowed out of bed to a  chair the following day.  There was some difficulties with oxygenation and  was noted to have bilateral scattered wheezes on exam.  A chest x-ray and  BMP were ordered.  A CT scan of the chest without contrast was ordered.  A  consultation with the hospice was obtained.  This proved be negative for a  pulmonary embolus.  On November 29, her dressings were changed and she was  weaned to oral pain medicines.  The Foley was discontinued.  She continued  to have improvement over hospital course and she was oxygenating at 94% on  room air, and was discharged on November 30 in improved condition to return  to the office in two weeks for follow up.  An EKG revealed normal sinus  rhythm.  I cannot exclude a prior infarct.  This was February 21, 1974.  Chest x-ray on November 28 revealed persistent left peripheral lung  nodule.  Lung cancer is not excluded.  Recommend a CT scan.  Subsegmental atelectasis  left lung base.  A chest CT with contrast revealed a radiographic  abnormality and corresponds to essentially calcific lung nodule consistent  with a granuloma.  There is a tiny right-sided lung nodule that measures 1  to 2 mm.  Resuming no risk for a malignancy, this is likely a granuloma.  A  fatty liver, cardiomegaly and minimal left lower lobe subsegmental  atelectasis was noted.  A CTA revealed no pulmonary emboli.   LABORATORY STUDIES:  Admitted with a hemoglobin of 14.8, hematocrit of  43.6%, white count 6,300, platelets were 249,000.  Discharge hemoglobin  10.8, hematocrit 31.6, white count 8,900, platelets 220,000.  Blood gas of  November 28 reveals a pH of 7.383, pCO2 of 48.6, pO2 of 54.3, bicarb 28.3,  total CO2 29.8, __________  was 3.6, O2 saturation 89%.  Preop chemistries:  Sodium 137, potassium 4.3, chloride 103, CO2 27, glucose 120, BUN 8,  creatinine 0.9, calcium 9.5, total protein 6.5, albumin 3.9, AST 27, ALT 19,  ALP 63, total bilirubin 0.7.   Discharge:  Sodium 137, potassium 4.4,  chloride 104, CO2 29, glucose 136, BUN 8, creatinine 0.8, calcium 8.3.  CK  was 213, MB 3.1, index at 1.5 with troponin 0.01 and this on November 28,  BNP is 53.9 normal.  Repeat CK on November 29 was 158, MB of 1.8, index 1.1  and troponin 0.01.  Urinalysis was benign for bloodied urine.   DISCHARGE INSTRUCTIONS:  She may ambulate weight bearing as tolerated using  a walker or crutches.  Resume diet at home.  I gave her a prescription for  Percocet one to two p.o. q.4h. p.r.n. pain, 5/235 strength.  Lovenox 40 mEq  subcu daily as instructed.  She will follow up with Dr. Sherlean Foot in two weeks  for follow up and staple removal.  Discharge in improved condition.  Home  health RN and PT.      Oris Drone Petrarca, P.A.-C.    ______________________________  Mila Homer. Sherlean Foot, M.D.    BDP/MEDQ  D:  04/25/2005  T:  04/25/2005  Job:  [id number]

## 2010-10-11 ENCOUNTER — Encounter: Payer: Self-pay | Admitting: Gastroenterology

## 2010-11-16 ENCOUNTER — Encounter: Payer: Self-pay | Admitting: Gastroenterology

## 2010-11-16 ENCOUNTER — Ambulatory Visit (INDEPENDENT_AMBULATORY_CARE_PROVIDER_SITE_OTHER): Payer: Federal, State, Local not specified - PPO | Admitting: Gastroenterology

## 2010-11-16 ENCOUNTER — Other Ambulatory Visit (INDEPENDENT_AMBULATORY_CARE_PROVIDER_SITE_OTHER): Payer: Federal, State, Local not specified - PPO

## 2010-11-16 VITALS — BP 120/80 | HR 68 | Ht 63.0 in | Wt 177.6 lb

## 2010-11-16 DIAGNOSIS — R194 Change in bowel habit: Secondary | ICD-10-CM

## 2010-11-16 DIAGNOSIS — R1011 Right upper quadrant pain: Secondary | ICD-10-CM

## 2010-11-16 DIAGNOSIS — E119 Type 2 diabetes mellitus without complications: Secondary | ICD-10-CM

## 2010-11-16 DIAGNOSIS — R198 Other specified symptoms and signs involving the digestive system and abdomen: Secondary | ICD-10-CM

## 2010-11-16 DIAGNOSIS — R197 Diarrhea, unspecified: Secondary | ICD-10-CM

## 2010-11-16 DIAGNOSIS — K219 Gastro-esophageal reflux disease without esophagitis: Secondary | ICD-10-CM

## 2010-11-16 LAB — IBC PANEL
Iron: 58 ug/dL (ref 42–145)
Saturation Ratios: 16.7 % — ABNORMAL LOW (ref 20.0–50.0)

## 2010-11-16 LAB — CBC WITH DIFFERENTIAL/PLATELET
Basophils Relative: 0.3 % (ref 0.0–3.0)
Eosinophils Absolute: 0.1 10*3/uL (ref 0.0–0.7)
Eosinophils Relative: 2 % (ref 0.0–5.0)
Lymphocytes Relative: 23.8 % (ref 12.0–46.0)
Monocytes Relative: 5.3 % (ref 3.0–12.0)
Neutrophils Relative %: 68.6 % (ref 43.0–77.0)
RBC: 5.18 Mil/uL — ABNORMAL HIGH (ref 3.87–5.11)
WBC: 6.9 10*3/uL (ref 4.5–10.5)

## 2010-11-16 LAB — BASIC METABOLIC PANEL
CO2: 29 mEq/L (ref 19–32)
Calcium: 9.3 mg/dL (ref 8.4–10.5)
Creatinine, Ser: 0.8 mg/dL (ref 0.4–1.2)
GFR: 71.03 mL/min (ref >60.00)
Glucose, Bld: 133 mg/dL — ABNORMAL HIGH (ref 70–99)

## 2010-11-16 LAB — HEPATIC FUNCTION PANEL
ALT: 13 U/L (ref 0–35)
Albumin: 4 g/dL (ref 3.5–5.2)
Total Protein: 7.3 g/dL (ref 6.0–8.3)

## 2010-11-16 LAB — VITAMIN B12: Vitamin B-12: 139 pg/mL — ABNORMAL LOW (ref 211–911)

## 2010-11-16 LAB — FOLATE: Folate: 14.4 ng/mL (ref >5.9)

## 2010-11-16 MED ORDER — COLESTIPOL HCL 1 G PO TABS: ORAL_TABLET | ORAL | Status: DC

## 2010-11-16 MED ORDER — PEG-KCL-NACL-NASULF-NA ASC-C 100 G PO SOLR: 1.0000 | Freq: Once | ORAL | Status: DC

## 2010-11-16 NOTE — Progress Notes (Signed)
History of Present Illness:  This is a 77 year old Caucasian female with non-insulin-dependent diabetes are controlled all by mouth metformin. She has chronic postprandial loose stools for several years perhaps related to previous cholecystectomy. She describes urgency after eating without rectal bleeding or significant abdominal pain. There is no history of hepatitis or pancreatitis, anorexia weight loss or systemic complaints. She does have chronic acid reflux with recent onset solid food dysphagia despite daily Nexium therapy. The problems have included mild obesity chronic thyroid dysfunction, and hypertension. No history of CVAs, MIs, or coagulation therapy. Family history is noncontributory.  I have reviewed this patient's present history, medical and surgical past history, allergies and medications.     ROS: The remainder of the 10 point ROS is negative.. she does complain of mild deafness, chronic low back pain, and occasional urinary incontinence. She also has had previous appendectomy, total bowel hysterectomy and removal of both ovaries. Her mother did have ovarian cancer. She denies systemic complaints such as fever, chills, or general malaise. She also denies any hepatobiliary symptomatology.  Past Medical History  Diagnosis Date  . Tremor   . GERD (gastroesophageal reflux disease)   . Hyperlipidemia   . Hypertension   . Allergy   . Urinary incontinence   . Diabetes mellitus type 2, controlled    Past Surgical History  Procedure Date  . Appendectomy   . Cholecystectomy   . Abdominal hysterectomy   . Knee arthroscopy     bilateral  . Carpal tunnel release   . Back surgery     reports that she has never smoked. She has never used smokeless tobacco. She reports that she does not drink alcohol or use illicit drugs. family history includes Diabetes in her father and sister and Ovarian cancer in her mother. Allergies  Allergen Reactions  . Celebrex (Celecoxib)     Muscle aches   . Crestor (Rosuvastatin Calcium)     Muscle aches  . Lipitor (Atorvastatin Calcium)     Muscle aches  . Simvastatin     Muscle aches  . Tricor     Muscle aches        Physical Exam: General well developed well nourished patient in no acute distress, appearing her stated age Eyes PERRLA, no icterus, fundoscopic exam per opthamologist Skin no lesions noted Neck supple, no adenopathy, no thyroid enlargement, no tenderness Chest clear to percussion and auscultation Heart no significant murmurs, gallops or rubs noted Abdomen no hepatosplenomegaly masses or tenderness, BS normal. Some tenderness noted over the hepatic area in the right upper quadrant not a definite mass.  Rectal inspection normal no fissures, or fistulae noted.  No masses or tenderness on digital exam. Stool guaiac negative. Extremities no acute joint lesions, edema, phlebitis or evidence of cellulitis. There is a superficial abrasion over the left anterior shin some crusting and weeping. Her multiple ecchymoses on the upper extremities noted. Neurologic patient oriented x 3, cranial nerves intact, no focal neurologic deficits noted. Psychological mental status normal and normal affect.  Assessment and plan: Chronic GERD with probable peptic stricture the esophagus. I have scheduled an endoscopy and possible dilatation. 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 She appears to have mild hepatomegaly an hour repeat her labs and liver function test. She is due for followup colonoscopy which also has been scheduled at the time of endoscopy. She may need further malabsorption workup dependent on her clinical course. We'll make adjustments in her diabetic medications for her procedures. I have placed  her on Colestid 1 g in mid morning as a therapeutic trial.  No diagnosis found.

## 2010-11-16 NOTE — Patient Instructions (Signed)
Your procedure has been scheduled for 11/20/2010, please follow the seperate instructions.  Please go to the basement today for your labs.  Your prescription(s) have been sent to you pharmacy.

## 2010-11-17 ENCOUNTER — Telehealth: Payer: Self-pay | Admitting: *Deleted

## 2010-11-17 NOTE — Telephone Encounter (Signed)
Message copied by Leonette Monarch on Fri Nov 17, 2010  1:20 PM ------      Message from: [place], DAVID R      Created: Fri Nov 17, 2010  8:17 AM       Start B12 shots and nasal spray

## 2010-11-17 NOTE — Telephone Encounter (Signed)
Pt advised and would like to have b12 injections at Dr Buel Ream office i will send over this note to advise and attach the labs also. Patient will need b12 1 ML IM once a week for three weeks, then b12 IM once a month for a year. Then an to have labs redrawn for repeat b12 either at Dr Buel Ream or Dr Maris Berger office. I have advised pt to contact Dr Buel Ream office to set up injections.

## 2010-11-20 ENCOUNTER — Encounter: Payer: Self-pay | Admitting: Gastroenterology

## 2010-11-20 ENCOUNTER — Ambulatory Visit (AMBULATORY_SURGERY_CENTER): Payer: Federal, State, Local not specified - PPO | Admitting: Gastroenterology

## 2010-11-20 DIAGNOSIS — Z860100 Personal history of colon polyps, unspecified: Secondary | ICD-10-CM

## 2010-11-20 DIAGNOSIS — K219 Gastro-esophageal reflux disease without esophagitis: Secondary | ICD-10-CM

## 2010-11-20 DIAGNOSIS — R1310 Dysphagia, unspecified: Secondary | ICD-10-CM | POA: Insufficient documentation

## 2010-11-20 DIAGNOSIS — Z1211 Encounter for screening for malignant neoplasm of colon: Secondary | ICD-10-CM

## 2010-11-20 DIAGNOSIS — D126 Benign neoplasm of colon, unspecified: Secondary | ICD-10-CM

## 2010-11-20 DIAGNOSIS — R1314 Dysphagia, pharyngoesophageal phase: Secondary | ICD-10-CM

## 2010-11-20 DIAGNOSIS — E538 Deficiency of other specified B group vitamins: Secondary | ICD-10-CM

## 2010-11-20 DIAGNOSIS — R198 Other specified symptoms and signs involving the digestive system and abdomen: Secondary | ICD-10-CM

## 2010-11-20 DIAGNOSIS — R194 Change in bowel habit: Secondary | ICD-10-CM

## 2010-11-20 DIAGNOSIS — R1011 Right upper quadrant pain: Secondary | ICD-10-CM

## 2010-11-20 DIAGNOSIS — K2950 Unspecified chronic gastritis without bleeding: Secondary | ICD-10-CM | POA: Insufficient documentation

## 2010-11-20 DIAGNOSIS — K2940 Chronic atrophic gastritis without bleeding: Secondary | ICD-10-CM

## 2010-11-20 HISTORY — DX: Personal history of colonic polyps: Z86.010

## 2010-11-20 HISTORY — DX: Personal history of colon polyps, unspecified: Z86.0100

## 2010-11-20 MED ORDER — SODIUM CHLORIDE 0.9 % IV SOLN: 500.0000 mL | INTRAVENOUS | Status: DC

## 2010-11-20 NOTE — Progress Notes (Signed)
Pt voiced no complaints in the recovery room or on discharge.  MAW  Pt's daughter helped her dress.  No problems noted. MAW

## 2010-11-20 NOTE — Patient Instructions (Signed)
See the picture page for your findings from your exam today.  Follow the green and blue discharge instruction sheets the rest of the day.  Resume your prior medications today. Please take either metamucil or benefiber daily over the counter.  Dr. Norval Gable nurse on the 3rd floor has arranged at Dr. Kathi Der office to set up B12 shots for you.  Please call Dr. Kathi Der office for the appointment time and date. Follow up appointment with Dr. Chrisandra Netters Sept. 11, 2012 at 10:45 on the 3rd floor.  Please call if any questions or concerns.

## 2010-11-21 ENCOUNTER — Telehealth: Payer: Self-pay | Admitting: *Deleted

## 2010-11-21 DIAGNOSIS — K2940 Chronic atrophic gastritis without bleeding: Secondary | ICD-10-CM

## 2010-11-21 NOTE — Telephone Encounter (Signed)

## 2010-11-23 ENCOUNTER — Encounter: Payer: Self-pay | Admitting: Gastroenterology

## 2010-11-29 ENCOUNTER — Encounter: Payer: Self-pay | Admitting: *Deleted

## 2010-12-05 ENCOUNTER — Encounter: Payer: Self-pay | Admitting: Gastroenterology

## 2010-12-05 ENCOUNTER — Other Ambulatory Visit: Payer: Federal, State, Local not specified - PPO

## 2010-12-05 ENCOUNTER — Ambulatory Visit (INDEPENDENT_AMBULATORY_CARE_PROVIDER_SITE_OTHER): Payer: Federal, State, Local not specified - PPO | Admitting: Gastroenterology

## 2010-12-05 VITALS — BP 128/72 | HR 64 | Ht 62.0 in | Wt 177.0 lb

## 2010-12-05 DIAGNOSIS — K219 Gastro-esophageal reflux disease without esophagitis: Secondary | ICD-10-CM

## 2010-12-05 DIAGNOSIS — K9089 Other intestinal malabsorption: Secondary | ICD-10-CM | POA: Insufficient documentation

## 2010-12-05 DIAGNOSIS — E538 Deficiency of other specified B group vitamins: Secondary | ICD-10-CM | POA: Insufficient documentation

## 2010-12-05 DIAGNOSIS — R194 Change in bowel habit: Secondary | ICD-10-CM

## 2010-12-05 DIAGNOSIS — R198 Other specified symptoms and signs involving the digestive system and abdomen: Secondary | ICD-10-CM

## 2010-12-05 DIAGNOSIS — Z860100 Personal history of colon polyps, unspecified: Secondary | ICD-10-CM | POA: Insufficient documentation

## 2010-12-05 MED ORDER — CYANOCOBALAMIN 1000 MCG/ML IJ SOLN
1000.0000 ug | Freq: Once | INTRAMUSCULAR | Status: AC
  Administered 2010-12-05: 1000 ug via INTRAMUSCULAR

## 2010-12-05 MED ORDER — CYANOCOBALAMIN 500 MCG/0.1ML NA SOLN: NASAL | Status: DC

## 2010-12-05 MED ORDER — COLESTIPOL HCL 1 G PO TABS: ORAL_TABLET | ORAL | Status: DC

## 2010-12-05 NOTE — Patient Instructions (Addendum)
You were given your first b12 injection today, make sure you contact Dr Buel Ream office to get your second injection on b12 next week and the third b12 injection in 2 weeks.  Your Nascobal has been sent to your pharmacy to start after your 3 weekly injections.  Increase your colestid to two tablets bu mouth in the morning.  Please go to the basement today for your labs.

## 2010-12-05 NOTE — Progress Notes (Signed)
History of Present Illness: This is a 77 year old Caucasian female who recently completed endoscopy and colonoscopy. She had some adenomatous polyps removed, but colonoscopy otherwise was unremarkable. However, biopsies were not performed. The patient was found to have rather marked B12 deficiency with serum B12 level 129. She has no neurological symptoms or chronic anemia, but that has not started B12 shots. Her GERD is brought control with daily Nexium. She is status post cholecystectomy and is currently on Colestid 1 g a day with only mild improvement in her diarrhea. The patient does have type II diabetes and chronic thyroid dysfunction treated by Dr. Rudi Heap.    Current Medications, Allergies, Past Medical History, Past Surgical History, Family History and Social History were reviewed in [employer] record.   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 I have increased her Colestid to 2 g a day. If this does not help we will need to obtain random colon biopsies. She is to continue her Nexium 40 mg a day for acid reflux and start B12 replacement therapy. I did order antiparietal cell antibody today to exclude pernicious anemia. Otherwise she is to continue her medications as listed and reviewed per primary care. Encounter Diagnoses  Name Primary?  . Change in bowel habits Yes  . Vitamin B12 deficiency

## 2010-12-11 ENCOUNTER — Telehealth: Payer: Self-pay | Admitting: Gastroenterology

## 2010-12-11 ENCOUNTER — Encounter: Payer: Self-pay | Admitting: Gastroenterology

## 2010-12-11 NOTE — Telephone Encounter (Signed)
Number taken was wrong i have called Dr Buel Ream office and noone answered. The labs and office visit have been routed to Dr Buel Ream office,.

## 2010-12-28 ENCOUNTER — Telehealth: Payer: Self-pay | Admitting: Gastroenterology

## 2010-12-28 NOTE — Telephone Encounter (Signed)
Faxed info to [phone].

## 2011-03-08 ENCOUNTER — Ambulatory Visit
Admission: RE | Admit: 2011-03-08 | Discharge: 2011-03-08 | Disposition: A | Discharge status: Home / Self Care | Payer: Federal, State, Local not specified - PPO | Source: Ambulatory Visit | Attending: Family Medicine | Admitting: Family Medicine

## 2011-03-08 ENCOUNTER — Other Ambulatory Visit: Payer: Self-pay | Admitting: Family Medicine

## 2011-03-08 DIAGNOSIS — M25551 Pain in right hip: Secondary | ICD-10-CM

## 2012-06-16 ENCOUNTER — Other Ambulatory Visit: Payer: Self-pay | Admitting: Nurse Practitioner

## 2012-06-16 ENCOUNTER — Telehealth: Payer: Self-pay | Admitting: Nurse Practitioner

## 2012-06-16 MED ORDER — TOLTERODINE TARTRATE ER 4 MG PO CP24: 4.0000 mg | ORAL_CAPSULE | Freq: Every day | ORAL | Status: DC

## 2012-06-16 NOTE — Telephone Encounter (Signed)
Sent to pharmacy 

## 2012-06-16 NOTE — Telephone Encounter (Signed)
WANTS TOLTERODINE CAP 4MG  CALLED IN TO [facility]. SHE ALSO HAS QUESTION ABOUT RX.

## 2012-06-18 ENCOUNTER — Other Ambulatory Visit: Payer: Self-pay | Admitting: Nurse Practitioner

## 2012-06-18 DIAGNOSIS — R3915 Urgency of urination: Secondary | ICD-10-CM

## 2012-06-18 MED ORDER — TOLTERODINE TARTRATE ER 4 MG PO CP24: 4.0000 mg | ORAL_CAPSULE | Freq: Every day | ORAL | Status: DC

## 2012-06-23 ENCOUNTER — Other Ambulatory Visit: Payer: Self-pay | Admitting: Nurse Practitioner

## 2012-06-23 NOTE — Telephone Encounter (Signed)
Mail order, print out and call pt

## 2012-06-24 ENCOUNTER — Telehealth: Payer: Self-pay | Admitting: Nurse Practitioner

## 2012-06-24 ENCOUNTER — Other Ambulatory Visit: Payer: Self-pay | Admitting: Nurse Practitioner

## 2012-06-24 MED ORDER — TOLTERODINE TARTRATE ER 4 MG PO CP24: 4.0000 mg | ORAL_CAPSULE | Freq: Every day | ORAL | Status: DC

## 2012-06-24 NOTE — Telephone Encounter (Signed)
She is on generic detrol LA. I will e=send rx to [facility]!

## 2012-06-24 NOTE — Telephone Encounter (Signed)
rx up front for patient to pick up it was for mail order

## 2012-06-24 NOTE — Telephone Encounter (Signed)
Please advise 

## 2012-06-24 NOTE — Progress Notes (Signed)
rx up front patient aware to pick up

## 2012-06-26 ENCOUNTER — Ambulatory Visit (INDEPENDENT_AMBULATORY_CARE_PROVIDER_SITE_OTHER): Payer: Federal, State, Local not specified - PPO | Admitting: Physician Assistant

## 2012-06-26 VITALS — BP 160/88 | HR 64 | Temp 97.8°F | Ht 62.5 in | Wt 187.0 lb

## 2012-06-26 DIAGNOSIS — R05 Cough: Secondary | ICD-10-CM

## 2012-06-26 DIAGNOSIS — R059 Cough, unspecified: Secondary | ICD-10-CM

## 2012-06-26 DIAGNOSIS — J209 Acute bronchitis, unspecified: Secondary | ICD-10-CM

## 2012-06-26 MED ORDER — AZITHROMYCIN 250 MG PO TABS: ORAL_TABLET | ORAL | Status: DC

## 2012-06-26 MED ORDER — PREDNISONE 20 MG PO TABS: 20.0000 mg | ORAL_TABLET | Freq: Every day | ORAL | Status: DC

## 2012-06-26 MED ORDER — ALBUTEROL SULFATE HFA 108 (90 BASE) MCG/ACT IN AERS: 2.0000 | INHALATION_SPRAY | Freq: Four times a day (QID) | RESPIRATORY_TRACT | Status: DC | PRN

## 2012-06-26 MED ORDER — HYDROCODONE-HOMATROPINE 5-1.5 MG/5ML PO SYRP: 5.0000 mL | ORAL_SOLUTION | Freq: Three times a day (TID) | ORAL | Status: DC | PRN

## 2012-06-27 ENCOUNTER — Telehealth: Payer: Self-pay | Admitting: Physician Assistant

## 2012-06-27 NOTE — Progress Notes (Signed)
  Subjective:    Patient ID: Leah Levine, female    DOB: [birth year], 79 y.o.   MRN: [id number]  HPI 3 days of night cough preventing sleep; productive barky cough, loss of voice   Review of Systems  HENT: Positive for ear pain, congestion, sore throat, rhinorrhea, voice change, postnasal drip and sinus pressure.   Respiratory: Positive for cough, chest tightness and shortness of breath.   All other systems reviewed and are negative.       Objective:   Physical Exam  Vitals reviewed. Constitutional: She is oriented to person, place, and time. She appears well-developed and well-nourished.  HENT:  Head: Normocephalic and atraumatic.  Right Ear: External ear normal.  Left Ear: External ear normal.  TMs retracted b/l Glassy eyed Sniffle dysphonia  Eyes: Conjunctivae and EOM are normal.  Neck: Normal range of motion. Neck supple. No tracheal deviation present.  Cardiovascular: Normal rate and normal heart sounds.   Pulmonary/Chest: Effort normal. No stridor. She has wheezes.  crackles  Neurological: She is alert and oriented to person, place, and time.  Skin: Skin is warm and dry.          Assessment & Plan:  Acute bronchitis - Plan: azithromycin (ZITHROMAX) 250 MG tablet  Cough - Plan: albuterol (PROVENTIL HFA;VENTOLIN HFA) 108 (90 BASE) MCG/ACT inhaler, predniSONE (DELTASONE) 20 MG tablet, HYDROcodone-homatropine (HYCODAN) 5-1.5 MG/5ML syrup

## 2012-06-27 NOTE — Telephone Encounter (Signed)
Daughter aware, rx for hycodan available for pick up here.

## 2012-08-21 ENCOUNTER — Other Ambulatory Visit: Payer: Self-pay

## 2012-08-21 NOTE — Telephone Encounter (Signed)
rx ready for pickup 

## 2012-08-21 NOTE — Telephone Encounter (Signed)
Last seen 07/17/12  Print and have nurse call patient to pick up for mail order

## 2012-08-21 NOTE — Telephone Encounter (Signed)
Seen 06/26/12  Print and have nurse call patient for pick up for mail order

## 2012-08-22 ENCOUNTER — Other Ambulatory Visit: Payer: Self-pay | Admitting: Nurse Practitioner

## 2012-08-22 MED ORDER — SITAGLIPTIN PHOS-METFORMIN HCL 50-500 MG PO TABS: 1.0000 | ORAL_TABLET | Freq: Two times a day (BID) | ORAL | Status: DC

## 2012-08-22 NOTE — Telephone Encounter (Signed)
Pt aware of med at pharm 

## 2012-08-25 ENCOUNTER — Other Ambulatory Visit: Payer: Self-pay

## 2012-08-25 MED ORDER — PANTOPRAZOLE SODIUM 40 MG PO TBEC: 40.0000 mg | DELAYED_RELEASE_TABLET | Freq: Every day | ORAL | Status: DC

## 2012-08-25 MED ORDER — SITAGLIPTIN PHOS-METFORMIN HCL 50-500 MG PO TABS: 1.0000 | ORAL_TABLET | Freq: Two times a day (BID) | ORAL | Status: DC

## 2012-08-25 NOTE — Telephone Encounter (Signed)
Last seen 06/26/12  Last glucose 7/13   Print for mail order and have nurse call patient

## 2012-08-28 ENCOUNTER — Telehealth: Payer: Self-pay | Admitting: Nurse Practitioner

## 2012-08-28 NOTE — Telephone Encounter (Signed)
appt made for Monday with mmm

## 2012-09-01 ENCOUNTER — Ambulatory Visit: Payer: Federal, State, Local not specified - PPO | Admitting: Nurse Practitioner

## 2012-09-03 ENCOUNTER — Encounter: Payer: Self-pay | Admitting: Nurse Practitioner

## 2012-09-03 ENCOUNTER — Ambulatory Visit (INDEPENDENT_AMBULATORY_CARE_PROVIDER_SITE_OTHER): Payer: Federal, State, Local not specified - PPO | Admitting: Nurse Practitioner

## 2012-09-03 VITALS — BP 137/67 | HR 64 | Temp 98.4°F | Ht 61.0 in | Wt 186.0 lb

## 2012-09-03 DIAGNOSIS — E785 Hyperlipidemia, unspecified: Secondary | ICD-10-CM

## 2012-09-03 DIAGNOSIS — D649 Anemia, unspecified: Secondary | ICD-10-CM

## 2012-09-03 DIAGNOSIS — R259 Unspecified abnormal involuntary movements: Secondary | ICD-10-CM

## 2012-09-03 DIAGNOSIS — K219 Gastro-esophageal reflux disease without esophagitis: Secondary | ICD-10-CM

## 2012-09-03 DIAGNOSIS — R32 Unspecified urinary incontinence: Secondary | ICD-10-CM

## 2012-09-03 DIAGNOSIS — E039 Hypothyroidism, unspecified: Secondary | ICD-10-CM

## 2012-09-03 DIAGNOSIS — R251 Tremor, unspecified: Secondary | ICD-10-CM

## 2012-09-03 LAB — COMPLETE METABOLIC PANEL WITH GFR
ALT: 19 U/L (ref 0–35)
Albumin: 4.1 g/dL (ref 3.5–5.2)
Alkaline Phosphatase: 62 U/L (ref 39–117)
CO2: 26 mEq/L (ref 19–32)
Potassium: 4.3 mEq/L (ref 3.5–5.3)
Sodium: 136 mEq/L (ref 135–145)
Total Bilirubin: 0.4 mg/dL (ref 0.3–1.2)
Total Protein: 6.8 g/dL (ref 6.0–8.3)

## 2012-09-03 LAB — THYROID PANEL WITH TSH
Free Thyroxine Index: 3.2 (ref 1.0–3.9)
T3 Uptake: 26.8 % (ref 22.5–37.0)
T4, Total: 11.8 ug/dL (ref 5.0–12.5)
TSH: 2 u[IU]/mL (ref 0.350–4.500)

## 2012-09-03 MED ORDER — SOLIFENACIN SUCCINATE 10 MG PO TABS: 10.0000 mg | ORAL_TABLET | Freq: Every day | ORAL | Status: DC

## 2012-09-03 MED ORDER — PANTOPRAZOLE SODIUM 40 MG PO TBEC: 40.0000 mg | DELAYED_RELEASE_TABLET | Freq: Every day | ORAL | Status: DC

## 2012-09-03 NOTE — Progress Notes (Signed)
Subjective:    Patient ID: Leah Levine, female    DOB: 1932/06/22, 79 y.o.   MRN: [id number]  Hyperlipidemia This is a chronic problem. The current episode started more than 1 year ago. The problem is uncontrolled. Recent lipid tests were reviewed and are high. Exacerbating diseases include diabetes and obesity. There are no known factors aggravating her hyperlipidemia. Associated symptoms include chest pain. Pertinent negatives include no focal weakness, leg pain, myalgias or shortness of breath. Treatments tried: patient cannot tolerate statins or zetia or fibrates. The current treatment provides no improvement of lipids. Compliance problems include adherence to diet and adherence to exercise.  Risk factors for coronary artery disease include diabetes mellitus, obesity, post-menopausal and a sedentary lifestyle.  Diabetes She presents for her follow-up diabetic visit. She has type 2 diabetes mellitus. No MedicAlert identification noted. The initial diagnosis of diabetes was made 5 years ago. Her disease course has been stable. There are no hypoglycemic associated symptoms. Associated symptoms include chest pain and fatigue. Pertinent negatives for diabetes include no polydipsia, no polyphagia, no polyuria, no visual change, no weakness and no weight loss. There are no hypoglycemic complications. Symptoms are stable. There are no diabetic complications. Risk factors for coronary artery disease include dyslipidemia, obesity, post-menopausal and sedentary lifestyle. Current diabetic treatment includes oral agent (dual therapy). She is compliant with treatment most of the time. Her weight is stable. When asked about meal planning, she reported none. She has not had a previous visit with a dietician. She rarely participates in exercise. There is no change in her home blood glucose trend. Her breakfast blood glucose is taken between 8-9 am. Her breakfast blood glucose range is generally 110-130 mg/dl.  (Patient only checks maybe 1X per month) An ACE inhibitor/angiotensin II receptor blocker is not being taken. She does not see a podiatrist.Eye exam is current (March 2013- pateint encouraged to make appointment.).  Thyroid Problem Symptoms include fatigue. Patient reports no hair loss, heat intolerance, nail problem, palpitations, visual change or weight loss. The symptoms have been stable. Her past medical history is significant for diabetes and hyperlipidemia.  tremors Propranolol- not helping anymore- tremors are all the time- Worse with purposeful movement. Urinary incontinence Patient says she can't hold urine-Says that it just happens all the sudden- No warning. Detrol LA not helping.  Hx of B12 anemia- use to be on shots but hasn't had in years Review of Systems  Constitutional: Positive for fatigue. Negative for weight loss.  Respiratory: Negative for shortness of breath.   Cardiovascular: Positive for chest pain. Negative for palpitations.  Endocrine: Negative for heat intolerance, polydipsia, polyphagia and polyuria.  Musculoskeletal: Negative for myalgias.  Neurological: Negative for focal weakness and weakness.  All other systems reviewed and are negative.       Objective:   Physical Exam  Constitutional: She is oriented to person, place, and time. She appears well-developed and well-nourished.  HENT:  Nose: Nose normal.  Mouth/Throat: Oropharynx is clear and moist.  Eyes: EOM are normal.  Neck: Trachea normal, normal range of motion and full passive range of motion without pain. Neck supple. No JVD present. Carotid bruit is not present. No thyromegaly present.  Cardiovascular: Normal rate, regular rhythm, normal heart sounds and intact distal pulses.  Exam reveals no gallop and no friction rub.   No murmur heard. Pulmonary/Chest: Effort normal and breath sounds normal.  Abdominal: Soft. Bowel sounds are normal. She exhibits no distension and no mass. There is no  tenderness.  Musculoskeletal:  Normal range of motion.  Lymphadenopathy:    She has no cervical adenopathy.  Neurological: She is alert and oriented to person, place, and time. She has normal reflexes.  Skin: Skin is warm and dry.  Psychiatric: She has a normal mood and affect. Her behavior is normal. Judgment and thought content normal.     BP 137/67  Pulse 64  Temp(Src) 98.4 F (36.9 C) (Oral)  Ht 5\' 1"  (1.549 m)  Wt 186 lb (84.369 kg)  BMI 35.16 kg/m2      Assessment & Plan:   1. Hyperlipidemia   2. GERD (gastroesophageal reflux disease)   3. Hypothyroidism   4. Urinary incontinence   5. Tremors of nervous system    Orders Placed This Encounter  Procedures  . Ambulatory referral to Neurology    Referral Priority:  Routine    Referral Type:  Consultation    Referral Reason:  Specialty Services Required    Requested Specialty:  Neurology    Number of Visits Requested:  1  . Ambulatory referral to Urology    Referral Priority:  Routine    Referral Type:  Consultation    Referral Reason:  Specialty Services Required    Requested Specialty:  Urology    Number of Visits Requested:  1   Meds ordered this encounter  Medications  . solifenacin (VESICARE) 10 MG tablet    Sig: Take 1 tablet (10 mg total) by mouth daily.    Dispense:  90 tablet    Refill:  1    Order Specific Question:  Supervising Provider    Answer:  Ernestina Penna [[id number]]  . pantoprazole (PROTONIX) 40 MG tablet    Sig: Take 1 tablet (40 mg total) by mouth daily.    Dispense:  90 tablet    Refill:  1    Order Specific Question:  Supervising Provider    Answer:  Christell Constant, DONALD W [[id number]]   Stopped detrol LA- Will try vesicare Continue all other meds Labs pending Diet and exercise encouraged Follow up in 3 months  Mary-Margaret Daphine Deutscher, FNP

## 2012-09-03 NOTE — Patient Instructions (Signed)

## 2012-09-04 LAB — NMR LIPOPROFILE WITH LIPIDS
HDL Particle Number: 25 umol/L — ABNORMAL LOW (ref >=30.5)
HDL-C: 35 mg/dL — ABNORMAL LOW (ref >=40)
Large HDL-P: 2.8 umol/L — ABNORMAL LOW (ref >=4.8)
Large VLDL-P: 31.2 nmol/L — ABNORMAL HIGH (ref <=2.7)
Triglycerides: 527 mg/dL — ABNORMAL HIGH (ref <150)

## 2012-09-06 ENCOUNTER — Other Ambulatory Visit: Payer: Self-pay | Admitting: Nurse Practitioner

## 2012-09-08 NOTE — Telephone Encounter (Signed)
Denied in error, refill called in x 2

## 2012-09-15 ENCOUNTER — Ambulatory Visit (INDEPENDENT_AMBULATORY_CARE_PROVIDER_SITE_OTHER): Payer: Federal, State, Local not specified - PPO | Admitting: Nurse Practitioner

## 2012-09-15 ENCOUNTER — Encounter: Payer: Self-pay | Admitting: Nurse Practitioner

## 2012-09-15 VITALS — BP 151/78 | HR 65 | Temp 98.0°F | Ht 61.0 in | Wt 186.0 lb

## 2012-09-15 DIAGNOSIS — B372 Candidiasis of skin and nail: Secondary | ICD-10-CM

## 2012-09-15 MED ORDER — NYSTATIN 100000 UNIT/GM EX CREA: TOPICAL_CREAM | Freq: Two times a day (BID) | CUTANEOUS | Status: DC

## 2012-09-15 MED ORDER — FLUCONAZOLE 150 MG PO TABS: ORAL_TABLET | ORAL | Status: DC

## 2012-09-15 NOTE — Patient Instructions (Signed)
Cutaneous Candidiasis Cutaneous candidiasis is a condition in which there is an overgrowth of yeast (candida) on the skin. Yeast normally live on the skin, but in small enough numbers not to cause any symptoms. In certain cases, increased growth of the yeast may cause an actual yeast infection. This kind of infection usually occurs in areas of the skin that are constantly warm and moist, such as the armpits or the groin. Yeast is the most common cause of diaper rash in babies and in people who cannot control their bowel movements (incontinence). CAUSES  The fungus that most often causes cutaneous candidiasis is Candida albicans. Conditions that can increase the risk of getting a yeast infection of the skin include:  Obesity.  Pregnancy.  Diabetes.  Taking antibiotic medicine.  Taking birth control pills.  Taking steroid medicines.  Thyroid disease.  An iron or zinc deficiency.  Problems with the immune system. SYMPTOMS   Red, swollen area of the skin.  Bumps on the skin.  Itchiness. DIAGNOSIS  The diagnosis of cutaneous candidiasis is usually based on its appearance. Light scrapings of the skin may also be taken and viewed under a microscope to identify the presence of yeast. TREATMENT  Antifungal creams may be applied to the infected skin. In severe cases, oral medicines may be needed.  HOME CARE INSTRUCTIONS   Keep your skin clean and dry.  Maintain a healthy weight.  If you have diabetes, keep your blood sugar under control. SEEK IMMEDIATE MEDICAL CARE IF:  Your rash continues to spread despite treatment.  You have a fever, chills, or abdominal pain. Document Released: 11/28/2010 Document Revised: 06/04/2011 Document Reviewed: 11/28/2010 [employer] Patient Information 2014 [employer].  

## 2012-09-15 NOTE — Progress Notes (Signed)
  Subjective:    Patient ID: Leah Levine, female    DOB: 1932-08-20, 79 y.o.   MRN: [id number]  HPI Patient in C/O rash in private area- Itching- Has been there for at least 4  Weeks- SHe has tried Cortisone 10, neosorin, [place] salve, but nothing has helped. Denies vaginal discharge. She has slight urinary incontinecne and wears pads all the time.    Review of Systems  All other systems reviewed and are negative.       Objective:   Physical Exam  Constitutional: She appears well-developed and well-nourished.  Cardiovascular: Normal rate and normal heart sounds.   Pulmonary/Chest: Effort normal and breath sounds normal.  Skin:  Erythematous mist papular rash bil groin and lower abdominal wall  BP 151/78  Pulse 65  Temp(Src) 98 F (36.7 C) (Oral)  Ht 5\' 1"  (1.549 m)  Wt 186 lb (84.369 kg)  BMI 35.16 kg/m2        Assessment & Plan:  1. Cutaneous candidiasis *Cool compresses if helps Try to keep as dry ad possible Avoid scratching - nystatin cream (MYCOSTATIN); Apply topically 2 (two) times daily.  Dispense: 30 g; Refill: 5 - fluconazole (DIFLUCAN) 150 MG tablet; 1 PO q week x 4 weeks  Dispense: 4 tablet; Refill: 0 RTO prn  Mary-Margaret Daphine Deutscher, FNP

## 2012-09-22 ENCOUNTER — Encounter: Payer: Self-pay | Admitting: Neurology

## 2012-09-23 ENCOUNTER — Encounter: Payer: Self-pay | Admitting: Neurology

## 2012-09-23 ENCOUNTER — Ambulatory Visit (INDEPENDENT_AMBULATORY_CARE_PROVIDER_SITE_OTHER): Payer: Federal, State, Local not specified - PPO | Admitting: Neurology

## 2012-09-23 VITALS — BP 153/83 | HR 65 | Ht 60.0 in | Wt 187.0 lb

## 2012-09-23 DIAGNOSIS — E538 Deficiency of other specified B group vitamins: Secondary | ICD-10-CM

## 2012-09-23 DIAGNOSIS — R259 Unspecified abnormal involuntary movements: Secondary | ICD-10-CM

## 2012-09-23 DIAGNOSIS — I10 Essential (primary) hypertension: Secondary | ICD-10-CM

## 2012-09-23 DIAGNOSIS — R251 Tremor, unspecified: Secondary | ICD-10-CM

## 2012-09-23 DIAGNOSIS — R198 Other specified symptoms and signs involving the digestive system and abdomen: Secondary | ICD-10-CM

## 2012-09-23 MED ORDER — PRIMIDONE 50 MG PO TABS: 50.0000 mg | ORAL_TABLET | Freq: Three times a day (TID) | ORAL | Status: DC
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
## 2012-11-24 DIAGNOSIS — I2510 Atherosclerotic heart disease of native coronary artery without angina pectoris: Secondary | ICD-10-CM

## 2012-11-24 HISTORY — DX: Atherosclerotic heart disease of native coronary artery without angina pectoris: I25.10

## 2012-11-25 ENCOUNTER — Encounter (HOSPITAL_COMMUNITY): Payer: Self-pay | Admitting: *Deleted

## 2012-11-25 ENCOUNTER — Other Ambulatory Visit: Payer: Self-pay

## 2012-11-25 ENCOUNTER — Encounter (HOSPITAL_COMMUNITY): Payer: Medicare Other

## 2012-11-25 ENCOUNTER — Inpatient Hospital Stay (HOSPITAL_COMMUNITY): Payer: Medicare Other

## 2012-11-25 ENCOUNTER — Emergency Department (HOSPITAL_COMMUNITY): Payer: Medicare Other

## 2012-11-25 ENCOUNTER — Encounter (HOSPITAL_COMMUNITY)
Admission: EM | Disposition: A | Discharge status: Home / Self Care | Payer: Self-pay | Source: Home / Self Care | Attending: Cardiothoracic Surgery

## 2012-11-25 ENCOUNTER — Inpatient Hospital Stay (HOSPITAL_COMMUNITY)
Admission: EM | Admit: 2012-11-25 | Discharge: 2012-12-02 | DRG: 234 | Disposition: A | Discharge status: Home / Self Care | Payer: Medicare Other | Attending: Cardiothoracic Surgery | Admitting: Cardiothoracic Surgery

## 2012-11-25 DIAGNOSIS — I6350 Cerebral infarction due to unspecified occlusion or stenosis of unspecified cerebral artery: Secondary | ICD-10-CM

## 2012-11-25 DIAGNOSIS — E119 Type 2 diabetes mellitus without complications: Secondary | ICD-10-CM | POA: Diagnosis present

## 2012-11-25 DIAGNOSIS — Z79899 Other long term (current) drug therapy: Secondary | ICD-10-CM

## 2012-11-25 DIAGNOSIS — A498 Other bacterial infections of unspecified site: Secondary | ICD-10-CM | POA: Diagnosis present

## 2012-11-25 DIAGNOSIS — I2510 Atherosclerotic heart disease of native coronary artery without angina pectoris: Secondary | ICD-10-CM

## 2012-11-25 DIAGNOSIS — E785 Hyperlipidemia, unspecified: Secondary | ICD-10-CM | POA: Diagnosis present

## 2012-11-25 DIAGNOSIS — E8779 Other fluid overload: Secondary | ICD-10-CM | POA: Diagnosis not present

## 2012-11-25 DIAGNOSIS — C44201 Unspecified malignant neoplasm of skin of unspecified ear and external auricular canal: Secondary | ICD-10-CM | POA: Diagnosis present

## 2012-11-25 DIAGNOSIS — I10 Essential (primary) hypertension: Secondary | ICD-10-CM | POA: Diagnosis present

## 2012-11-25 DIAGNOSIS — E039 Hypothyroidism, unspecified: Secondary | ICD-10-CM | POA: Diagnosis present

## 2012-11-25 DIAGNOSIS — R748 Abnormal levels of other serum enzymes: Secondary | ICD-10-CM | POA: Diagnosis present

## 2012-11-25 DIAGNOSIS — N390 Urinary tract infection, site not specified: Secondary | ICD-10-CM | POA: Diagnosis present

## 2012-11-25 DIAGNOSIS — E538 Deficiency of other specified B group vitamins: Secondary | ICD-10-CM

## 2012-11-25 DIAGNOSIS — Z7982 Long term (current) use of aspirin: Secondary | ICD-10-CM

## 2012-11-25 DIAGNOSIS — I214 Non-ST elevation (NSTEMI) myocardial infarction: Principal | ICD-10-CM | POA: Diagnosis present

## 2012-11-25 DIAGNOSIS — G8190 Hemiplegia, unspecified affecting unspecified side: Secondary | ICD-10-CM | POA: Diagnosis not present

## 2012-11-25 DIAGNOSIS — Z951 Presence of aortocoronary bypass graft: Secondary | ICD-10-CM

## 2012-11-25 DIAGNOSIS — B952 Enterococcus as the cause of diseases classified elsewhere: Secondary | ICD-10-CM | POA: Diagnosis present

## 2012-11-25 DIAGNOSIS — D62 Acute posthemorrhagic anemia: Secondary | ICD-10-CM | POA: Diagnosis not present

## 2012-11-25 DIAGNOSIS — R072 Precordial pain: Secondary | ICD-10-CM

## 2012-11-25 DIAGNOSIS — C44300 Unspecified malignant neoplasm of skin of unspecified part of face: Secondary | ICD-10-CM | POA: Diagnosis present

## 2012-11-25 DIAGNOSIS — K219 Gastro-esophageal reflux disease without esophagitis: Secondary | ICD-10-CM | POA: Diagnosis present

## 2012-11-25 DIAGNOSIS — Z01810 Encounter for preprocedural cardiovascular examination: Secondary | ICD-10-CM

## 2012-11-25 HISTORY — DX: Hypothyroidism, unspecified: E03.9

## 2012-11-25 HISTORY — PX: LEFT HEART CATHETERIZATION WITH CORONARY ANGIOGRAM: SHX5451

## 2012-11-25 LAB — PROTIME-INR
INR: 0.96 (ref 0.00–1.49)
INR: 0.97 (ref 0.00–1.49)
Prothrombin Time: 12.7 seconds (ref 11.6–15.2)

## 2012-11-25 LAB — COMPREHENSIVE METABOLIC PANEL
ALT: 21 U/L (ref 0–35)
AST: 37 U/L (ref 0–37)
Alkaline Phosphatase: 64 U/L (ref 39–117)
CO2: 27 mEq/L (ref 19–32)
Chloride: 100 mEq/L (ref 96–112)
GFR calc non Af Amer: 61 mL/min — ABNORMAL LOW (ref >90)
Sodium: 137 mEq/L (ref 135–145)
Total Bilirubin: 0.4 mg/dL (ref 0.3–1.2)

## 2012-11-25 LAB — APTT: aPTT: 30 seconds (ref 24–37)

## 2012-11-25 LAB — CBC WITH DIFFERENTIAL/PLATELET
Basophils Absolute: 0 10*3/uL (ref 0.0–0.1)
HCT: 43 % (ref 36.0–46.0)
Lymphocytes Relative: 29 % (ref 12–46)
Neutro Abs: 4.7 10*3/uL (ref 1.7–7.7)
Platelets: 227 10*3/uL (ref 150–400)
RDW: 15.1 % (ref 11.5–15.5)
WBC: 8.1 10*3/uL (ref 4.0–10.5)

## 2012-11-25 LAB — TROPONIN I
Troponin I: 0.97 ng/mL (ref <0.30)
Troponin I: 0.69 ng/mL (ref <0.30)

## 2012-11-25 LAB — URINALYSIS, ROUTINE W REFLEX MICROSCOPIC
Glucose, UA: 250 mg/dL — AB
Hgb urine dipstick: NEGATIVE
Ketones, ur: NEGATIVE mg/dL
Protein, ur: NEGATIVE mg/dL

## 2012-11-25 LAB — URINE MICROSCOPIC-ADD ON

## 2012-11-25 LAB — POCT I-STAT TROPONIN I: Troponin i, poc: 0.96 ng/mL (ref 0.00–0.08)

## 2012-11-25 LAB — LIPID PANEL
Cholesterol: 187 mg/dL (ref 0–200)
HDL: 27 mg/dL — ABNORMAL LOW (ref >39)
Triglycerides: 213 mg/dL — ABNORMAL HIGH (ref <150)

## 2012-11-25 LAB — OCCULT BLOOD, POC DEVICE: Fecal Occult Bld: NEGATIVE

## 2012-11-25 SURGERY — LEFT HEART CATHETERIZATION WITH CORONARY ANGIOGRAM
Anesthesia: LOCAL

## 2012-11-25 MED ORDER — BISACODYL 5 MG PO TBEC
5.0000 mg | DELAYED_RELEASE_TABLET | Freq: Once | ORAL | Status: AC
  Administered 2012-11-25: 5 mg via ORAL
  Filled 2012-11-25: qty 1

## 2012-11-25 MED ORDER — ONDANSETRON HCL 4 MG/2ML IJ SOLN
4.0000 mg | Freq: Once | INTRAMUSCULAR | Status: AC
  Administered 2012-11-25: 4 mg via INTRAVENOUS
  Filled 2012-11-25: qty 2

## 2012-11-25 MED ORDER — VANCOMYCIN HCL 10 G IV SOLR
1250.0000 mg | INTRAVENOUS | Status: AC
  Administered 2012-11-26: 1250 mg via INTRAVENOUS
  Filled 2012-11-25: qty 1250

## 2012-11-25 MED ORDER — NITROGLYCERIN 0.2 MG/ML ON CALL CATH LAB
INTRAVENOUS | Status: AC
  Filled 2012-11-25: qty 1

## 2012-11-25 MED ORDER — ASPIRIN 81 MG PO CHEW: 81.0000 mg | CHEWABLE_TABLET | Freq: Every day | ORAL | Status: DC

## 2012-11-25 MED ORDER — NITROGLYCERIN IN D5W 200-5 MCG/ML-% IV SOLN
2.0000 ug/min | INTRAVENOUS | Status: DC
  Filled 2012-11-25: qty 250

## 2012-11-25 MED ORDER — DEXTROSE 5 % IV SOLN
1.5000 g | INTRAVENOUS | Status: AC
  Administered 2012-11-26: 1.5 g via INTRAVENOUS
  Administered 2012-11-26: .75 g via INTRAVENOUS
  Filled 2012-11-25: qty 1.5

## 2012-11-25 MED ORDER — HEPARIN SODIUM (PORCINE) 1000 UNIT/ML IJ SOLN
INTRAMUSCULAR | Status: AC
  Filled 2012-11-25: qty 1

## 2012-11-25 MED ORDER — DEXTROSE 5 % IV SOLN
750.0000 mg | INTRAVENOUS | Status: DC
  Filled 2012-11-25 (×2): qty 750

## 2012-11-25 MED ORDER — CHLORHEXIDINE GLUCONATE 4 % EX LIQD
60.0000 mL | Freq: Once | CUTANEOUS | Status: AC
  Administered 2012-11-25: 4 via TOPICAL
  Filled 2012-11-25: qty 60

## 2012-11-25 MED ORDER — NITROGLYCERIN 0.4 MG SL SUBL
0.4000 mg | SUBLINGUAL_TABLET | SUBLINGUAL | Status: DC | PRN
  Administered 2012-11-25 (×3): 0.4 mg via SUBLINGUAL
  Filled 2012-11-25: qty 25

## 2012-11-25 MED ORDER — NITROGLYCERIN IN D5W 200-5 MCG/ML-% IV SOLN
INTRAVENOUS | Status: AC
  Administered 2012-11-25: 50000 ug
  Filled 2012-11-25: qty 250

## 2012-11-25 MED ORDER — TEMAZEPAM 15 MG PO CAPS
15.0000 mg | ORAL_CAPSULE | Freq: Once | ORAL | Status: AC | PRN
  Administered 2012-11-25: 15 mg via ORAL
  Filled 2012-11-25: qty 1

## 2012-11-25 MED ORDER — HEPARIN BOLUS VIA INFUSION
4000.0000 [IU] | Freq: Once | INTRAVENOUS | Status: AC
  Administered 2012-11-25: 4000 [IU] via INTRAVENOUS
  Filled 2012-11-25: qty 4000

## 2012-11-25 MED ORDER — LIDOCAINE HCL (PF) 1 % IJ SOLN
INTRAMUSCULAR | Status: AC
  Filled 2012-11-25: qty 30

## 2012-11-25 MED ORDER — METOPROLOL TARTRATE 12.5 MG HALF TABLET
12.5000 mg | ORAL_TABLET | Freq: Once | ORAL | Status: AC
  Administered 2012-11-26: 12.5 mg via ORAL
  Filled 2012-11-25: qty 1

## 2012-11-25 MED ORDER — MAGNESIUM SULFATE 50 % IJ SOLN
40.0000 meq | INTRAMUSCULAR | Status: DC
  Filled 2012-11-25: qty 10

## 2012-11-25 MED ORDER — METOPROLOL TARTRATE 1 MG/ML IV SOLN
5.0000 mg | Freq: Once | INTRAVENOUS | Status: AC
  Administered 2012-11-25: 5 mg via INTRAVENOUS
  Filled 2012-11-25: qty 5

## 2012-11-25 MED ORDER — VERAPAMIL HCL 2.5 MG/ML IV SOLN
INTRAVENOUS | Status: AC
  Filled 2012-11-25: qty 2

## 2012-11-25 MED ORDER — NITROGLYCERIN 0.4 MG SL SUBL: 0.4000 mg | SUBLINGUAL_TABLET | SUBLINGUAL | Status: DC | PRN

## 2012-11-25 MED ORDER — PHENYLEPHRINE HCL 10 MG/ML IJ SOLN
30.0000 ug/min | INTRAVENOUS | Status: AC
  Administered 2012-11-26: 15 ug/min via INTRAVENOUS
  Filled 2012-11-25: qty 2

## 2012-11-25 MED ORDER — SODIUM CHLORIDE 0.9 % IV SOLN
INTRAVENOUS | Status: DC
  Filled 2012-11-25: qty 30

## 2012-11-25 MED ORDER — ACETAMINOPHEN 325 MG PO TABS: 650.0000 mg | ORAL_TABLET | ORAL | Status: DC | PRN

## 2012-11-25 MED ORDER — EPINEPHRINE HCL 1 MG/ML IJ SOLN
0.5000 ug/min | INTRAMUSCULAR | Status: DC
  Filled 2012-11-25: qty 4

## 2012-11-25 MED ORDER — HEPARIN (PORCINE) IN NACL 100-0.45 UNIT/ML-% IJ SOLN
800.0000 [IU]/h | INTRAMUSCULAR | Status: DC
  Administered 2012-11-26: 800 [IU]/h via INTRAVENOUS
  Filled 2012-11-25: qty 250

## 2012-11-25 MED ORDER — STUDY - INVESTIGATIONAL DRUG SIMPLE RECORD
7.5000 mg | Freq: Two times a day (BID) | Status: DC
  Administered 2012-11-25 – 2012-12-02 (×12): 7.5 mg via ORAL
  Filled 2012-11-25 (×8): qty 7.5

## 2012-11-25 MED ORDER — EZETIMIBE 10 MG PO TABS
10.0000 mg | ORAL_TABLET | Freq: Every day | ORAL | Status: DC
  Administered 2012-11-27 – 2012-12-02 (×6): 10 mg via ORAL
  Filled 2012-11-25 (×8): qty 1

## 2012-11-25 MED ORDER — HEPARIN (PORCINE) IN NACL 100-0.45 UNIT/ML-% IJ SOLN
700.0000 [IU]/h | Freq: Once | INTRAMUSCULAR | Status: AC
  Administered 2012-11-25: 700 [IU]/h via INTRAVENOUS
  Filled 2012-11-25 (×2): qty 250

## 2012-11-25 MED ORDER — MIDAZOLAM HCL 2 MG/2ML IJ SOLN
INTRAMUSCULAR | Status: AC
  Filled 2012-11-25: qty 2

## 2012-11-25 MED ORDER — NITROGLYCERIN 2 % TD OINT
1.0000 [in_us] | TOPICAL_OINTMENT | Freq: Once | TRANSDERMAL | Status: AC
  Administered 2012-11-25: 1 [in_us] via TOPICAL
  Filled 2012-11-25: qty 30

## 2012-11-25 MED ORDER — HEPARIN (PORCINE) IN NACL 2-0.9 UNIT/ML-% IJ SOLN
INTRAMUSCULAR | Status: AC
  Filled 2012-11-25: qty 1000

## 2012-11-25 MED ORDER — PROPRANOLOL HCL 60 MG PO TABS
60.0000 mg | ORAL_TABLET | Freq: Two times a day (BID) | ORAL | Status: DC
  Filled 2012-11-25 (×2): qty 1

## 2012-11-25 MED ORDER — FENTANYL CITRATE 0.05 MG/ML IJ SOLN
INTRAMUSCULAR | Status: AC
  Filled 2012-11-25: qty 2

## 2012-11-25 MED ORDER — PRIMIDONE 50 MG PO TABS
50.0000 mg | ORAL_TABLET | Freq: Three times a day (TID) | ORAL | Status: DC
  Administered 2012-11-25 (×2): 50 mg via ORAL
  Filled 2012-11-25 (×8): qty 1

## 2012-11-25 MED ORDER — LEVOTHYROXINE SODIUM 100 MCG PO TABS
100.0000 ug | ORAL_TABLET | Freq: Every day | ORAL | Status: DC
  Administered 2012-11-25 – 2012-12-02 (×7): 100 ug via ORAL
  Filled 2012-11-25 (×10): qty 1

## 2012-11-25 MED ORDER — PLASMA-LYTE 148 IV SOLN
INTRAVENOUS | Status: AC
  Administered 2012-11-26: 10:00:00
  Filled 2012-11-25: qty 2.5

## 2012-11-25 MED ORDER — SODIUM CHLORIDE 0.9 % IV SOLN
INTRAVENOUS | Status: DC
  Filled 2012-11-25: qty 40

## 2012-11-25 MED ORDER — ASPIRIN EC 325 MG PO TBEC
325.0000 mg | DELAYED_RELEASE_TABLET | Freq: Once | ORAL | Status: AC
  Administered 2012-11-25: 325 mg via ORAL
  Filled 2012-11-25: qty 1

## 2012-11-25 MED ORDER — MORPHINE SULFATE 4 MG/ML IJ SOLN
4.0000 mg | Freq: Once | INTRAMUSCULAR | Status: AC
  Administered 2012-11-25: 4 mg via INTRAVENOUS
  Filled 2012-11-25: qty 1

## 2012-11-25 MED ORDER — NITROGLYCERIN IN D5W 200-5 MCG/ML-% IV SOLN
3.0000 ug/min | INTRAVENOUS | Status: DC
  Administered 2012-11-25: 10 ug/min via INTRAVENOUS

## 2012-11-25 MED ORDER — POTASSIUM CHLORIDE 2 MEQ/ML IV SOLN
80.0000 meq | INTRAVENOUS | Status: DC
  Filled 2012-11-25: qty 40

## 2012-11-25 MED ORDER — DEXMEDETOMIDINE HCL IN NACL 400 MCG/100ML IV SOLN
0.1000 ug/kg/h | INTRAVENOUS | Status: DC
  Filled 2012-11-25: qty 100

## 2012-11-25 MED ORDER — STUDY - INVESTIGATIONAL DRUG SIMPLE RECORD
7.5000 mg | Freq: Two times a day (BID) | Status: DC
  Filled 2012-11-25 (×2): qty 7.5

## 2012-11-25 MED ORDER — SODIUM CHLORIDE 0.9 % IV SOLN: INTRAVENOUS | Status: DC

## 2012-11-25 MED ORDER — INSULIN ASPART 100 UNIT/ML ~~LOC~~ SOLN
0.0000 [IU] | Freq: Three times a day (TID) | SUBCUTANEOUS | Status: DC
Start: 2012-11-25 — End: 2012-11-26
  Administered 2012-11-25: 5 [IU] via SUBCUTANEOUS
  Administered 2012-11-25: 3 [IU] via SUBCUTANEOUS
  Administered 2012-11-26: 2 [IU] via SUBCUTANEOUS

## 2012-11-25 MED ORDER — SODIUM CHLORIDE 0.9 % IV SOLN
INTRAVENOUS | Status: DC
  Filled 2012-11-25: qty 1

## 2012-11-25 MED ORDER — DOPAMINE-DEXTROSE 3.2-5 MG/ML-% IV SOLN
2.0000 ug/kg/min | INTRAVENOUS | Status: DC
  Filled 2012-11-25: qty 250

## 2012-11-25 MED ORDER — MORPHINE SULFATE 2 MG/ML IJ SOLN
2.0000 mg | Freq: Once | INTRAMUSCULAR | Status: AC
  Administered 2012-11-25: 2 mg via INTRAVENOUS
  Filled 2012-11-25 (×2): qty 1

## 2012-11-25 MED ORDER — ONDANSETRON HCL 4 MG/2ML IJ SOLN
4.0000 mg | Freq: Four times a day (QID) | INTRAMUSCULAR | Status: DC | PRN
  Administered 2012-11-25: 4 mg via INTRAVENOUS
  Filled 2012-11-25: qty 2

## 2012-11-25 MED ORDER — CHLORHEXIDINE GLUCONATE 4 % EX LIQD
60.0000 mL | Freq: Once | CUTANEOUS | Status: AC
Start: 2012-11-26 — End: 2012-11-26
  Administered 2012-11-26: 4 via TOPICAL
  Filled 2012-11-25: qty 60

## 2012-11-25 MED ORDER — METOPROLOL TARTRATE 12.5 MG HALF TABLET
12.5000 mg | ORAL_TABLET | Freq: Four times a day (QID) | ORAL | Status: DC
  Administered 2012-11-25 – 2012-11-26 (×3): 12.5 mg via ORAL
  Filled 2012-11-25 (×8): qty 1

## 2012-11-25 MED ORDER — SODIUM CHLORIDE 0.9 % IV SOLN
1.0000 mL/kg/h | INTRAVENOUS | Status: DC
  Administered 2012-11-25: 1 mL/kg/h via INTRAVENOUS

## 2012-11-25 MED ORDER — HEPARIN (PORCINE) IN NACL 100-0.45 UNIT/ML-% IJ SOLN
700.0000 [IU]/h | Freq: Once | INTRAMUSCULAR | Status: AC
  Administered 2012-11-25: 700 [IU]/h via INTRAVENOUS

## 2012-11-25 MED ORDER — ATORVASTATIN CALCIUM 80 MG PO TABS
80.0000 mg | ORAL_TABLET | Freq: Every day | ORAL | Status: DC
  Administered 2012-11-25 – 2012-12-01 (×6): 80 mg via ORAL
  Filled 2012-11-25 (×8): qty 1

## 2012-11-25 NOTE — ED Notes (Signed)
Per sisters pt having chest pain all week; sisters arrived from out of town; EMS called to house Saturday but pt refused to come to hospital; pt took a bus tour to [place] and arrived back in town tonight and came straight to the hospital; c/o severe chest pain; holding chest on arrival; aspirin 81 mg given prior to arrival

## 2012-11-25 NOTE — Progress Notes (Signed)
1415- Dr Tyrone Sage at bedside to discuss plan of care with patient and family. MD states pt is to have bedside PFTs only. If unable to do bedside, pt is unable to go to office.

## 2012-11-25 NOTE — Code Documentation (Addendum)
Patient s/p cath procedure today, LKW at 1630, upon first assessment by bedside RN, patient lethargic from procedure. At 2100 bedside RN noted lethargy, right side weakness, and slurred speech. Bedside RN stay with patient for approximately 30 minutes to assess possibility of medication reaction from cath procedure. Code stroke called at 2222, LKW 1630, stroke team arrived 2235, neurologist arrived 2230, patient arrived in CT at 2250, CT read by Dr. Leroy Kennedy at 2255. Initial NIH 03 for right arm and leg weakness and some slurred speech. Right sided weakness improving around 2305. Will continue to monitor, patient had been on heparin during the day, and received angiomax during her cath procedure. Patient scheduled for first case CABG on 9/3

## 2012-11-25 NOTE — Progress Notes (Addendum)
ANTICOAGULATION CONSULT NOTE - follow up Pharmacy Consult for heparin Indication: NSTEMI  Allergies  Allergen Reactions  . Celebrex [Celecoxib]     Muscle aches  . Crestor [Rosuvastatin Calcium]     Muscle aches  . Fenofibrate     Muscle aches  . Hydrocodone   . Lipitor [Atorvastatin Calcium]     Muscle aches  . Simvastatin     Muscle aches    Patient Measurements: Height: 4' 11.84" (152 cm) Weight: 180 lb (81.647 kg) IBW/kg (Calculated) : 45.14 Heparin Dosing Weight: 65kg  Vital Signs: Temp: 98 F (36.7 C) (09/02 1230) Temp src: Oral (09/02 1230) BP: 120/63 mmHg (09/02 1300) Pulse Rate: 62 (09/02 1300)  Labs:  Recent Labs  11/25/12 0135 11/25/12 0224 11/25/12 0629 11/25/12 1225  HGB 14.1  --   --   --   HCT 43.0  --   --   --   PLT 227  --   --   --   APTT  --  30  --   --   LABPROT  --  12.6  --   --   INR  --  0.96  --   --   HEPARINUNFRC  --   --   --  0.69  CREATININE 0.88  --   --   --   TROPONINI  --   --  0.97*  --     Estimated Creatinine Clearance: 49.7 ml/min (by C-G formula based on Cr of 0.88).    Assessment: 79yo female c/o progressive substernal CP x10d at rest and with exertion, found to have elevated troponin, to continue heparin started by [facility] EDMD.  She was given heparin 4000 units followed by a drip at 700 units/hr by EDMD at [facility].  Since that time she has gone to the cath lab and found to have severe 3 V CAD and a CVTS consult for CABG is planned.  She was given heparin boluses in the cath lab.  A heparin level was drawn at 1225 post-cath and was 0.69.  This does NOT reflect the prior drip rate of 700 units/hr.  This reflects the boluses she got in the cath lab.  Heparin is to be resumed 8 hrs after the TR band is removed.  Currently RN is unable to remove TR band due to oozing.  Goal of Therapy:  Heparin level 0.3-0.7 units/ml Monitor platelets by anticoagulation protocol: Yes   Plan:  1. Resume heparin 8 hrs after TR band resumed at  800 units/hr and check 8 hr HL.  RN to call when TR removed 2. Continue daily HL and CBC Herby Abraham, Pharm.D. [phone] 11/25/2012 1:24 PM   ADDENDUM Spoke with RN who told me she just was able to get the TR band off at 2045. No more bleeding noted. Heparin to start at 800units/hr at 0500 as this will be ~8hours after band removal. Per RN, patient to go for surgery around 1000 tomorrow, so heparin will need to be turned off on call to surgery. Will not enter a HL for before surgery.  Arjuna Doeden D. Ailene Royal, PharmD Clinical Pharmacist Pager: [phone] 11/25/2012 8:50 PM

## 2012-11-25 NOTE — Progress Notes (Signed)
Multiple attempts to deflate TR band. Unsuccessful past 11cc's of air. Bleeding reoccurred during turning to use bathroom. 3cc's air reinflated. Dr Shirlee Latch paged.

## 2012-11-25 NOTE — ED Provider Notes (Addendum)
CSN: [id number]     Arrival date & time 11/25/12  0045 History   First MD Initiated Contact with Patient 11/25/12 0142     Chief Complaint  Patient presents with  . Chest Pain   (Consider location/radiation/quality/duration/timing/severity/associated sxs/prior Treatment) HPI This is a 79 year old female with a history of hypertension, diabetes, and hypercholesterolemia who presents with chest pain. Patient reports one week of chest pain. She reports that it is constant but the intensity waxes and wanes. It is pressure-like and nonradiating. It is worse with exertion. It is worse with deep inspiration.  Patient also endorses shortness of breath and diaphoresis. She is a nonsmoker. The patient's sister states that they called EMS on Saturday morning prior to leaving for [facility] for a [employer] game but the patient declined to come to the hospital at that time. Patient denies any history of blood clot. Patient rates her pain at 5/10. Past Medical History  Diagnosis Date  . Tremor   . GERD (gastroesophageal reflux disease)   . Hyperlipidemia   . Allergy   . Urinary incontinence   . Diabetes mellitus type 2, controlled   . Hypertension     patient denies ever having hypertension  . Personal history of colonic polyps 11/20/2010    tubular adenomas  . Hyperthyroidism   . Anemia   . Skin cancer     ear, right side of cheek   Past Surgical History  Procedure Laterality Date  . Appendectomy    . Cholecystectomy    . Abdominal hysterectomy    . Knee arthroscopy      bilateral  . Carpal tunnel release    . Back surgery    . Gallbladder surgery    . Knees Bilateral    Family History  Problem Relation Age of Onset  . Ovarian cancer Mother   . Diabetes Father   . Pneumonia Father   . Diabetes Sister    History  Substance Use Topics  . Smoking status: Never Smoker   . Smokeless tobacco: Never Used  . Alcohol Use: No   OB History   Grav Para Term Preterm Abortions TAB SAB Ect Mult  Living                 Review of Systems  Constitutional: Negative for fever.  Respiratory: Positive for shortness of breath. Negative for cough and chest tightness.   Cardiovascular: Positive for chest pain. Negative for palpitations and leg swelling.  Gastrointestinal: Positive for blood in stool. Negative for nausea, vomiting and abdominal pain.  Genitourinary: Negative for dysuria.  Musculoskeletal: Negative for back pain.  Skin: Negative for wound.  Neurological: Negative for syncope and headaches.  Psychiatric/Behavioral: Negative for confusion.  All other systems reviewed and are negative.    Allergies  Celebrex; Crestor; Fenofibrate; Hydrocodone; Lipitor; and Simvastatin  Home Medications   Current Outpatient Rx  Name  Route  Sig  Dispense  Refill  . aspirin 81 MG tablet   Oral   Take 81 mg by mouth daily.           Marland Kitchen levothyroxine (SYNTHROID, LEVOTHROID) 100 MCG tablet   Oral   Take 100 mcg by mouth daily.           Marland Kitchen nystatin cream (MYCOSTATIN)   Topical   Apply topically 2 (two) times daily.   30 g   5   . primidone (MYSOLINE) 50 MG tablet   Oral   Take 50 mg by mouth 3 (three) times  daily.         . propranolol (INDERAL) 60 MG tablet   Oral   Take 60 mg by mouth 2 (two) times daily.          . Bismuth Subsalicylate (PEPTO-BISMOL PO)   Oral   Take by mouth as needed.          . sitaGLIPtan-metformin (JANUMET) 50-500 MG per tablet   Oral   Take 1 tablet by mouth 2 (two) times daily with a meal.   180 tablet   0     NTBS    BP 137/72  Pulse 76  Temp(Src) 98.8 F (37.1 C) (Oral)  Resp 18  Ht 4' 11.84" (1.52 m)  Wt 180 lb (81.647 kg)  BMI 35.34 kg/m2  SpO2 94% Physical Exam  Nursing note and vitals reviewed. Constitutional: She is oriented to person, place, and time. She appears well-developed and well-nourished. No distress.  Elderly  HENT:  Head: Normocephalic and atraumatic.  Eyes: Pupils are equal, round, and reactive to  light.  Neck: Neck supple.  Cardiovascular: Normal rate, regular rhythm and normal heart sounds.   No murmur heard. Pulmonary/Chest: Effort normal. No respiratory distress.  No crackles noted  Abdominal: Soft. Bowel sounds are normal. There is no tenderness.  Genitourinary:  Guaiac negative   Musculoskeletal:  Trace bilateral lower extremity edema  Neurological: She is alert and oriented to person, place, and time.  Skin: Skin is warm and dry.  Psychiatric: She has a normal mood and affect.    ED Course  Procedures (including critical care time) Labs Review Labs Reviewed  CBC WITH DIFFERENTIAL - Abnormal; Notable for the following:    RBC 5.27 (*)    All other components within normal limits  COMPREHENSIVE METABOLIC PANEL - Abnormal; Notable for the following:    Glucose, Bld 211 (*)    GFR calc non Af Amer 61 (*)    GFR calc Af Amer 71 (*)    All other components within normal limits  D-DIMER, QUANTITATIVE - Abnormal; Notable for the following:    D-Dimer, Quant 0.67 (*)    All other components within normal limits  URINALYSIS, ROUTINE W REFLEX MICROSCOPIC - Abnormal; Notable for the following:    APPearance CLOUDY (*)    Glucose, UA 250 (*)    Leukocytes, UA MODERATE (*)    All other components within normal limits  URINE MICROSCOPIC-ADD ON - Abnormal; Notable for the following:    Squamous Epithelial / LPF FEW (*)    Bacteria, UA FEW (*)    All other components within normal limits  POCT I-STAT TROPONIN I - Abnormal; Notable for the following:    Troponin i, poc 0.96 (*)    All other components within normal limits  URINE CULTURE  APTT  PROTIME-INR   Imaging Review Dg Chest 2 View  11/25/2012   *RADIOLOGY REPORT*  Clinical Data: Chest pain  CHEST - 2 VIEW  Comparison: Prior radiograph from 10/07/2007  Findings: Cardiac and mediastinal silhouettes are stable in size and contour, and remain within normal limits.  The lungs are normally inflated.  No airspace  consolidation, pleural effusion, or pulmonary edema is identified.  There is no pneumothorax.  9 mm nodular opacity overlying the left mid lung is grossly stable as compared to the prior examination.  No acute osseous abnormality identified.  IMPRESSION:  No acute cardiopulmonary process.   Original Report Authenticated By: Rise Mu, M.D.   EKG independently reviewed by myself:  Normal sinus rhythm with a rate of 71, ST depression of 1 mm in lead 2, less than 1 mm in V2 through V4. T wave inversions noted in V2. MDM   1. NSTEMI (non-ST elevated myocardial infarction)    This is a 79 year old female who presents with one week of chest pain. She is nontoxic-appearing on exam her vital signs are notable for blood pressure 152/79. EKG was independently reviewed by myself. There is no evidence of acute ST elevation. Patient does have T-wave inversions in the anterior leads with less than 1 mm of depression. We do not have an old EKG for comparison.  Patient was given a full dose aspirin. She was given sublingual nitroglycerin as well as nitroglycerin paste. Lab work was obtained including a troponin. Chest x-ray is within normal limits. Troponin is positive at 0.96. Patient continues to endorse 5 out of 10 pain. At one point she endorses increasing for pain to 7/10.  Repeat EKG was obtained to evaluate for dynamic changes, no changes noted. Given the patient's reporting of blood in her stools, rectal exam was performed which was guaiac negative. Patient was started on a heparin drip. She was given morphine and at last recheck her pain was 4/10. I discussed the patient with Dr. Gala Romney, he requested the patient receive Lopressor and be transferred to [facility].  Shon Baton, MD 11/25/12 [phone]  Of note, a d-dimer was initially obtained given that the patient was low risk for PE but did have a pleuritic component to her pain. D-dimer is positive at 0.67. I still have low suspicion for PE at  this time. Followup bilateral lower extremity ultrasound is recommended.  Shon Baton, MD 11/25/12 [phone]

## 2012-11-25 NOTE — Progress Notes (Signed)
Echocardiogram 2D Echocardiogram has been performed.  Leah Levine 11/25/2012, 4:06 PM

## 2012-11-25 NOTE — Research (Signed)
Lattitude Study Informed Consent   Subject Name: Leah Levine  Subject met inclusion and exclusion criteria.  The informed consent form, study requirements and expectations were reviewed with the subject and questions and concerns were addressed prior to the signing of the consent form.  The subject verbalized understanding of the trail requirements.  The subject agreed to participate in the Latitude trial and signed the informed consent.  The informed consent was obtained prior to performance of any protocol-specific procedures for the subject.  A copy of the signed informed consent was given to the subject and a copy was placed in the subject's medical record.  [name] 11/25/2012, 09:22

## 2012-11-25 NOTE — Progress Notes (Signed)
ANTICOAGULATION CONSULT NOTE - Initial Consult  Pharmacy Consult for heparin Indication: NSTEMI  Allergies  Allergen Reactions  . Celebrex [Celecoxib]     Muscle aches  . Crestor [Rosuvastatin Calcium]     Muscle aches  . Fenofibrate     Muscle aches  . Hydrocodone   . Lipitor [Atorvastatin Calcium]     Muscle aches  . Simvastatin     Muscle aches    Patient Measurements: Height: 4' 11.84" (152 cm) Weight: 180 lb (81.647 kg) IBW/kg (Calculated) : 45.14 Heparin Dosing Weight: 65kg  Vital Signs: Temp: 98.2 F (36.8 C) (09/02 0600) Temp src: Oral (09/02 0600) BP: 134/64 mmHg (09/02 0700) Pulse Rate: 59 (09/02 0700)  Labs:  Recent Labs  11/25/12 0135 11/25/12 0224  HGB 14.1  --   HCT 43.0  --   PLT 227  --   APTT  --  30  LABPROT  --  12.6  INR  --  0.96  CREATININE 0.88  --     Estimated Creatinine Clearance: 49.7 ml/min (by C-G formula based on Cr of 0.88).   Medical History: Past Medical History  Diagnosis Date  . Tremor   . GERD (gastroesophageal reflux disease)   . Hyperlipidemia   . Allergy   . Urinary incontinence   . Diabetes mellitus type 2, controlled   . Hypertension     patient denies ever having hypertension  . Personal history of colonic polyps 11/20/2010    tubular adenomas  . Hypothyroidism   . Anemia   . Skin cancer     ear, right side of cheek    Medications:  Prescriptions prior to admission  Medication Sig Dispense Refill  . aspirin 81 MG tablet Take 81 mg by mouth daily.        Marland Kitchen levothyroxine (SYNTHROID, LEVOTHROID) 100 MCG tablet Take 100 mcg by mouth daily.        Marland Kitchen nystatin cream (MYCOSTATIN) Apply topically 2 (two) times daily.  30 g  5  . primidone (MYSOLINE) 50 MG tablet Take 50 mg by mouth 3 (three) times daily.      . propranolol (INDERAL) 60 MG tablet Take 60 mg by mouth 2 (two) times daily.       . Bismuth Subsalicylate (PEPTO-BISMOL PO) Take by mouth as needed.       . sitaGLIPtan-metformin (JANUMET) 50-500 MG  per tablet Take 1 tablet by mouth 2 (two) times daily with a meal.  180 tablet  0   Scheduled:  . aspirin  81 mg Oral Daily  . ezetimibe  10 mg Oral Daily  . insulin aspart  0-15 Units Subcutaneous TID WC  . levothyroxine  100 mcg Oral QAC breakfast  . primidone  50 mg Oral TID  . propranolol  60 mg Oral BID   Infusions:  . sodium chloride    . nitroGLYCERIN      Assessment: 79yo female c/o progressive substernal CP x10d at rest and with exertion, found to have elevated troponin, to continue heparin started by [facility] EDMD.  Goal of Therapy:  Heparin level 0.3-0.7 units/ml Monitor platelets by anticoagulation protocol: Yes   Plan:  Rec'd heparin 4000 units x1 followed by gtt at 700 units/hr as ordered by EDMD; will continue for now and monitor heparin levels and CBC.  Vernard Gambles, PharmD, BCPS  11/25/2012,7:26 AM

## 2012-11-25 NOTE — ED Notes (Signed)
Pt presents to the Ed with a complaint of chest pain.  Pt states that the pain is in the central chest and radiates below her breasts.  Pt has shortness of breath.  Pt has nausea.

## 2012-11-25 NOTE — Consult Note (Signed)
[address]       [place] [address]             [phone]        Leah Levine [facility] Medical Record #[id number] Date of Birth: [birth year]  Referring: Dr Shirlee Latch Primary Care: Rudi Heap, MD  Chief Complaint:    Chief Complaint  Patient presents with  . Chest Pain    History of Present Illness:     79 y/o female with no  prior cardiac history. About  10 days ago when she began to experience intermittent rest and exertional chest discomfort described as occasional dull aching and at other times as a severe "grabbing" sensation, associated with dyspnea. EMS was called to see her Saturday night secondary to chest pain, but was not evaluated in ER. She then took bus to [place] and went to two baseball games, on return she got off bus and went to [facility] ER. While at baseball games she had increased frequency and severity of symptoms .  She  presented to the [facility] ED where ECG showed diffuse ST depression and initial troponin was elevated @ 0.96. She was placed on heparin and treated with ntp/mso4, asa, and bb, and then transferred to [facility] CCU for cardiology admission.     Current Activity/ Functional Status: Patient is independent with mobility/ambulation, transfers, ADL's, IADL's.   Zubrod Score: At the time of surgery this patient's most appropriate activity status/level should be described as: []  Normal activity, no symptoms [x]  Symptoms, fully ambulatory []  Symptoms, in bed less than or equal to 50% of the time []  Symptoms, in bed greater than 50% of the time but less than 100% []  Bedridden []  Moribund  Past Medical History  Diagnosis Date  . Tremor   . GERD (gastroesophageal reflux disease)   . Hyperlipidemia   . Allergy   . Urinary incontinence   . Diabetes mellitus type 2, controlled   . Hypertension     patient denies ever having hypertension  . Personal history of colonic polyps 11/20/2010    tubular adenomas  . Hypothyroidism   .  Anemia   . Skin cancer     ear, right side of cheek    Past Surgical History  Procedure Laterality Date  . Appendectomy    . Cholecystectomy    . Abdominal hysterectomy    . Knee arthroscopy      bilateral  . Carpal tunnel release    . Back surgery    . Gallbladder surgery    . Knees Bilateral     History  Smoking status  . Never Smoker   Smokeless tobacco  . Never Used    History  Alcohol Use No    History   Social History  . Marital Status: Widowed , husband died 08/03/08    Spouse Name: Leah Levine    Number of Children: 2  . Years of Education: 11   Occupational History  . Retired    Social History Main Topics  . Smoking status: Never Smoker   . Smokeless tobacco: Never Used  . Alcohol Use: No  . Drug Use: No  . Sexual Activity: No    Social History Narrative   Lives in [place] with her grandchildren.  She is widowed.  Her husband died in 08/03/08.       Allergies  Allergen Reactions  . Celebrex [Celecoxib]     Muscle aches  . Crestor [Rosuvastatin Calcium]  Muscle aches  . Fenofibrate     Muscle aches  . Hydrocodone   . Lipitor [Atorvastatin Calcium]     Muscle aches  . Simvastatin     Muscle aches    Current Facility-Administered Medications  Medication Dose Route Frequency Provider Last Rate Last Dose  . 0.9 %  sodium chloride infusion   Intravenous Continuous [name], MD 75 mL/hr at 11/25/12 1315    . acetaminophen (TYLENOL) tablet 650 mg  650 mg Oral Q4H PRN [name], MD      . aspirin chewable tablet 81 mg  81 mg Oral Daily Ok Anis, NP      . atorvastatin (LIPITOR) tablet 80 mg  80 mg Oral q1800 [name], MD      . ezetimibe (ZETIA) tablet 10 mg  10 mg Oral Daily Ok Anis, NP      . insulin aspart (novoLOG) injection 0-15 Units  0-15 Units Subcutaneous TID WC Ok Anis, NP   3 Units at 11/25/12 [phone]  . Latitude-TIMI Study- Losmapimod/Placebo (PI- Stuckey )  7.5 mg Oral BID Peter M Swaz[place],  MD      . levothyroxine (SYNTHROID, LEVOTHROID) tablet 100 mcg  100 mcg Oral QAC breakfast Ok Anis, NP   100 mcg at 11/25/12 0835  . metoprolol tartrate (LOPRESSOR) tablet 12.5 mg  12.5 mg Oral Q6H [name], MD   12.5 mg at 11/25/12 1341  . nitroGLYCERIN (NITROSTAT) SL tablet 0.4 mg  0.4 mg Sublingual Q5 min PRN Shon Baton, MD   0.4 mg at 11/25/12 0337  . nitroGLYCERIN (NITROSTAT) SL tablet 0.4 mg  0.4 mg Sublingual Q5 Min x 3 PRN Ok Anis, NP      . nitroGLYCERIN 0.2 mg/mL in dextrose 5 % infusion  3-30 mcg/min Intravenous Titrated Ok Anis, NP 3 mL/hr at 11/25/12 0756 10 mcg/min at 11/25/12 0756  . ondansetron (ZOFRAN) injection 4 mg  4 mg Intravenous Q6H PRN [name], MD      . primidone (MYSOLINE) tablet 50 mg  50 mg Oral TID Ok Anis, NP        Prescriptions prior to admission  Medication Sig Dispense Refill  . aspirin 81 MG tablet Take 81 mg by mouth daily.        Marland Kitchen levothyroxine (SYNTHROID, LEVOTHROID) 100 MCG tablet Take 100 mcg by mouth daily.        Marland Kitchen nystatin cream (MYCOSTATIN) Apply topically 2 (two) times daily.  30 g  5  . primidone (MYSOLINE) 50 MG tablet Take 50 mg by mouth 3 (three) times daily.      . propranolol (INDERAL) 60 MG tablet Take 60 mg by mouth 2 (two) times daily.       . Bismuth Subsalicylate (PEPTO-BISMOL PO) Take by mouth as needed.       . sitaGLIPtan-metformin (JANUMET) 50-500 MG per tablet Take 1 tablet by mouth 2 (two) times daily with a meal.  180 tablet  0    Family History  Problem Relation Age of Onset  . Ovarian cancer Mother   . Diabetes Father   . Pneumonia Father   . Diabetes Sister      Review of Systems:     Cardiac Review of Systems: Y or N  Chest Pain [ Y  ]  Resting SOB Gilian.Kraft   ] Exertional SOB  [ Y ]  Orthopnea [ Y ]   Pedal Edema [  N  ]    Palpitations [ N ] Syncope  Klaus.Mock  ]   Presyncope [ Y  ]  General Review of Systems: [Y] = yes [  ]=no Constitional: recent weight  change [  ]; anorexia [  ]; fatigue [  ]; nausea [  ]; night sweats [  ]; fever [  ]; or chills [  ]                                                               Dental: poor dentition[  ]; Last Dentist visit:   Eye : blurred vision [  ]; diplopia [   ]; vision changes [ N ];  Amaurosis fugax[  ]; Resp: cough Klaus.Mock  ];  wheezing[ N ];  Hemoptysis[  ]; shortness of breath[ Y ]; paroxysmal nocturnal dyspnea[ Y ]; dyspnea on exertion[ Y ]; or orthopnea[  Y];  GI:  gallstones[ REMOVED ], vomiting[  ];  dysphagia[  ]; melena[  ];  hematochezia [  ]; heartburn[  ];   Hx of  Colonoscopy[  ]; GU: kidney stones [  ]; hematuria[  ];   dysuria [  ];  nocturia[  ];  history of obstruction [  ]; urinary frequency [  ]             Skin: rash, swelling[  ];, hair loss[  ];  peripheral edema[  ];  or itching[  ]; Musculosketetal: myalgias[ Y ];  joint swelling[ Y ];  joint erythema[  ];  joint pain[ Y];  back pain[ Y ];  Heme/Lymph: bruising[N  ];  bleeding[ N ];  anemia[N  ];  Neuro: TIA[  ];  headaches[  ];  stroke[  ];  vertigo[  ];  seizures[ N ];   paresthesias[  ];  difficulty walking[  Y];  Psych:depression[N  ]; Elvina Mattes  ];  Endocrine: diabetes[Y  ];  thyroid dysfunction[ Y ];  Immunizations: Flu [ N ]; Pneumococcal[ N ];  Other:  Physical Exam: BP 124/58  Pulse 63  Temp(Src) 98 F (36.7 C) (Oral)  Resp 14  Ht 4' 11.84" (1.52 m)  Wt 180 lb (81.647 kg)  BMI 35.34 kg/m2  SpO2 99%  General appearance: alert, cooperative, appears older than stated age and no distress Neurologic: intact Heart: regular rate and rhythm, S1, S2 normal, no murmur, click, rub or gallop and normal apical impulse Lungs: clear to auscultation bilaterally Abdomen: soft, non-tender; bowel sounds normal; no masses,  no organomegaly Extremities: extremities normal, atraumatic, no cyanosis or edema and Homans sign is negative, no sign of DVT Wound: RT RADIAL CATH SITE INTACT palpable dp and pt pulses bilateral No carotid  bruits  Diagnostic Studies & Laboratory data:     Recent Radiology Findings:   Dg Chest 2 View  11/25/2012   *RADIOLOGY REPORT*  Clinical Data: Chest pain  CHEST - 2 VIEW  Comparison: Prior radiograph from 10/07/2007  Findings: Cardiac and mediastinal silhouettes are stable in size and contour, and remain within normal limits.  The lungs are normally inflated.  No airspace consolidation, pleural effusion, or pulmonary edema is identified.  There is no pneumothorax.  9 mm nodular opacity overlying the left mid lung is grossly stable as compared to the prior examination.  No acute osseous abnormality identified.  IMPRESSION:  No acute cardiopulmonary process.   Original Report Authenticated By: Rise Mu, M.D.      Recent Lab Findings: Lab Results  Component Value Date   WBC 8.1 11/25/2012   HGB 14.1 11/25/2012   HCT 43.0 11/25/2012   PLT 227 11/25/2012   GLUCOSE 211* 11/25/2012   CHOL 187 11/25/2012   TRIG 213* 11/25/2012   HDL 27* 11/25/2012   LDLCALC 117* 11/25/2012   ALT 21 11/25/2012   AST 37 11/25/2012   NA 137 11/25/2012   K 4.6 11/25/2012   CL 100 11/25/2012   CREATININE 0.88 11/25/2012   BUN 16 11/25/2012   CO2 27 11/25/2012   TSH 2.000 09/03/2012   INR 0.96 11/25/2012   HGBA1C 6.3* 07/03/2010    Lab Results  Component Value Date   TROPONINI 0.97* 11/25/2012    CATH:  Cardiac Catheterization Procedure Note  Name: Leah Levine  MRN: [id number]  DOB: [birth year]  Procedure: Left Heart Cath, Selective Coronary Angiography, LV angiography  Indication: NSTEMI  Procedural Details: The right wrist was prepped, draped, and anesthetized with 1% lidocaine. Using the modified Seldinger technique, a 5 French sheath was introduced into the right radial artery. 3 mg of verapamil was administered through the sheath, weight-based unfractionated heparin was administered intravenously. Standard Judkins catheters were used for selective coronary angiography and left ventriculography. Catheter exchanges were  performed over an exchange length guidewire. There were no immediate procedural complications. A TR band was used for radial hemostasis at the completion of the procedure. The patient was transferred to the post catheterization recovery area for further monitoring.  Procedural Findings:  Hemodynamics:  AO 115/63  LV 123/26  Coronary angiography:  Coronary dominance: right  Left mainstem: 20% stenosis.  Left anterior descending (LAD): There is a large high D1 with 60% proximal stenosis. After D1, there are two serial 70% stenoses in the proximal LAD proximal and distal to 1st septal.  Left circumflex (LCx): Subtotal occlusion of the proximal LCx after small OM1. The mid to distal LCx with a moderate OM2 and a PLOM fills very late. There are no significant collaterals.  Right coronary artery (RCA): RCA is totally occluded proximally. There are good collaterals to PLV and PDA from the LAD.  Left ventriculography: Left ventricular systolic function is normal, LVEF is estimated at 55-60%, there is no significant mitral regurgitation  Final Conclusions: Severe 3 vessel disease with preserved EF. Suspect LCx occlusion is the acute event (does not have good collaterals like the RCA does). I think she will be best-served by CABG. Will resume heparin after appropriate interval when sheath is out. Consult CVTS.   Marca Ancona  11/25/2012, 11:56 AM    Assessment / Plan:   Severe 3 vessel disease with preserved EF, nonstemi on presentation Hyperlipidemia, intolerant of statins Hypothryiodism  I have discussed dx and findings with the patient and her family. CABG is recommended due to symptoms and degree of blockage. The goals risks and alternatives of the planned surgical procedure CABG  have been discussed with the patient in detail. The risks of the procedure including death, infection, stroke, myocardial infarction, bleeding, blood transfusion have all been discussed specifically.  I have quoted Ancil Linsey a 5 % of perioperative mortality and a complication rate as high as 40 %. The patient's questions have been answered.Leah Levine is willing  to proceed with the planned procedure.   Delight Ovens MD  [address] [place] [address] Office [phone]   Beeper [phone]  11/25/2012 1:51 PM

## 2012-11-25 NOTE — ED Notes (Signed)
Notified ERP Horton and RN of critical value.

## 2012-11-25 NOTE — Progress Notes (Signed)
Spoke with Dr Shirlee Latch regarding TR band. MD states to hold TR band until better hemostasis achieved.
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
## 2012-11-25 NOTE — Interval H&P Note (Signed)
History and Physical Interval Note:  11/25/2012 11:20 AM  Leah Levine  has presented today for surgery, with the diagnosis of cp  The various methods of treatment have been discussed with the patient and family. After consideration of risks, benefits and other options for treatment, the patient has consented to  Procedure(s): LEFT HEART CATHETERIZATION WITH CORONARY ANGIOGRAM (N/A) as a surgical intervention .  The patient's history has been reviewed, patient examined, no change in status, stable for surgery.  I have reviewed the patient's chart and labs.  Questions were answered to the patient's satisfaction.     Amarria Andreasen [employer]

## 2012-11-25 NOTE — Progress Notes (Signed)
Contacted by lab regarding type & screen. Notified by Lab that pt's blood has an antibody that makes process take longer than normal. 4 units are ready. Please notify lab if additional units needed.

## 2012-11-25 NOTE — Progress Notes (Signed)
Upon initial assessment pt was lethargic at 2040, but able to answer questions. Upon assessment at 2100, pt was unable to answer question, had slurred speech, and right sided weakness. Pt daughter said she had not received any medication per day shift RN, and has never been like this. Mendel Corning, MD contacted as well as Tyrone Sage, MD. Head CT ordered. Code stroke called per Charge RN Luther Hearing.

## 2012-11-25 NOTE — Progress Notes (Signed)
Patient ID: GWENDOLIN BRIEL, female   DOB: [birth year], 79 y.o.   MRN: [id number]   Cardiac Catheterization Procedure Note  Name: CHARRISSE MASLEY MRN: [id number] DOB: 05-10-1932  Procedure: Left Heart Cath, Selective Coronary Angiography, LV angiography  Indication: NSTEMI   Procedural Details: The right wrist was prepped, draped, and anesthetized with 1% lidocaine. Using the modified Seldinger technique, a 5 French sheath was introduced into the right radial artery. 3 mg of verapamil was administered through the sheath, weight-based unfractionated heparin was administered intravenously. Standard Judkins catheters were used for selective coronary angiography and left ventriculography. Catheter exchanges were performed over an exchange length guidewire. There were no immediate procedural complications. A TR band was used for radial hemostasis at the completion of the procedure.  The patient was transferred to the post catheterization recovery area for further monitoring.  Procedural Findings: Hemodynamics: AO 115/63 LV 123/26  Coronary angiography: Coronary dominance: right  Left mainstem: 20% stenosis.   Left anterior descending (LAD): There is a large high D1 with 60% proximal stenosis.  After D1, there are two serial 70% stenoses in the proximal LAD proximal and distal to 1st septal.   Left circumflex (LCx): Subtotal occlusion of the proximal LCx after small OM1.  The mid to distal LCx with a moderate OM2 and a PLOM fills very late.  There are no significant collaterals.   Right coronary artery (RCA): RCA is totally occluded proximally.  There are good collaterals to PLV and PDA from the LAD.   Left ventriculography: Left ventricular systolic function is normal, LVEF is estimated at 55-60%, there is no significant mitral regurgitation   Final Conclusions:  Severe 3 vessel disease with preserved EF.  Suspect LCx occlusion is the acute event (does not have good collaterals like the  RCA does).  I think she will be best-served by CABG.  Will resume heparin after appropriate interval when sheath is out.  Consult CVTS.    Marca Ancona 11/25/2012, 11:56 AM

## 2012-11-25 NOTE — Progress Notes (Addendum)
Pre-op Cardiac Surgery  Carotid Findings:  Findings suggest 1-39% internal carotid artery stenosis bilaterally. Vertebral arteries are patent with antegrade flow.  11/25/2012 3:50 PM Michelle Simonetti, RVT, RDCS, RDMS    Upper Extremity Right Left  Brachial Pressures  123 triphasic  Radial Waveforms biphasic monophasic  Ulnar Waveforms  monophasic  Palmar Arch (Allen's Test) * **WNL   Findings:  *Right:  Palmar arch was not done due to cath via radial artery.  **Left:  Doppler waveforms remain normal with ulnar and radial compressions.    Lower  Extremity Right Left  Dorsalis Pedis    Anterior Tibial 142 biphasic 151 biphasic  Posterior Tibial 78 dampened monophasic 83 dampened monophasic  Ankle/Brachial Indices >1.0 >1.0    Findings:  ABI is within normal limits bilaterally.

## 2012-11-25 NOTE — H&P (Signed)
Patient ID: Leah Levine MRN: [id number], DOB/AGE: 77/26/1934   Admit date: 11/25/2012  Primary Physician: Rudi Heap, MD Primary Cardiologist: New - f/u in [place]  Pt. Profile:  79 y/o female without prior cardiac hx who presented to [facility] w/ chest pain and was found to have an abnl ECG with elevated troponin.  Problem List  Past Medical History  Diagnosis Date  . Tremor   . GERD (gastroesophageal reflux disease)   . Hyperlipidemia   . Allergy   . Urinary incontinence   . Diabetes mellitus type 2, controlled   . Hypertension     patient denies ever having hypertension  . Personal history of colonic polyps 11/20/2010    tubular adenomas  . Hyperthyroidism   . Anemia   . Skin cancer     ear, right side of cheek    Past Surgical History  Procedure Laterality Date  . Appendectomy    . Cholecystectomy    . Abdominal hysterectomy    . Knee arthroscopy      bilateral  . Carpal tunnel release    . Back surgery    . Gallbladder surgery    . Knees Bilateral      Allergies  Allergies  Allergen Reactions  . Celebrex [Celecoxib]     Muscle aches  . Crestor [Rosuvastatin Calcium]     Muscle aches  . Fenofibrate     Muscle aches  . Hydrocodone   . Lipitor [Atorvastatin Calcium]     Muscle aches  . Simvastatin     Muscle aches    HPI  79 y/o female without prior cardiac history.  She was in her usoh until approx 10 days ago when she began to experience intermittent rest and exertional chest discomfort described as occasional dull aching and at other times as a severe "grabbing" sensation, associated with dyspnea and lasting varied amounts of time.  Due to progressively more frequent and severe Ss, her sisters encouraged her to seek medical attention last night.  She presented to the [facility] ED where ECG showed diffuse ST depression and initial troponin was elevated @ 0.96.  She was placed on heparin and treated with ntp/mso4, asa, and bb, and then transferred  to [facility] CCU for cardiology admission.  F/U ECG's show more pronounced diffuse ST depression, particularly in ant leads.  She is currently c/p free.  Home Medications  Prior to Admission medications   Medication Sig Start Date End Date Taking? Authorizing Provider  aspirin 81 MG tablet Take 81 mg by mouth daily.     Yes Historical Provider, MD  levothyroxine (SYNTHROID, LEVOTHROID) 100 MCG tablet Take 100 mcg by mouth daily.     Yes Historical Provider, MD  nystatin cream (MYCOSTATIN) Apply topically 2 (two) times daily. 09/15/12  Yes Mary-Margaret Daphine Deutscher, FNP  primidone (MYSOLINE) 50 MG tablet Take 50 mg by mouth 3 (three) times daily. 09/23/12  Yes Levert Feinstein, MD  propranolol (INDERAL) 60 MG tablet Take 60 mg by mouth 2 (two) times daily.    Yes Historical Provider, MD  Bismuth Subsalicylate (PEPTO-BISMOL PO) Take by mouth as needed.     Historical Provider, MD  sitaGLIPtan-metformin (JANUMET) 50-500 MG per tablet Take 1 tablet by mouth 2 (two) times daily with a meal. 08/25/12   Mary-Margaret Daphine Deutscher, FNP   Family History  Family History  Problem Relation Age of Onset  . Ovarian cancer Mother   . Diabetes Father   . Pneumonia Father   . Diabetes  Sister    Social History  History   Social History  . Marital Status: Married    Spouse Name: Onalee Hua    Number of Children: 2  . Years of Education: 11   Occupational History  . Retired    Social History Main Topics  . Smoking status: Never Smoker   . Smokeless tobacco: Never Used  . Alcohol Use: No  . Drug Use: No  . Sexual Activity: Not on file   Other Topics Concern  . Not on file   Social History Narrative   Lives in [place] with her grandchildren.  She is widowed.  Her husband died in 08/13/2008.      Review of Systems General:  No chills, fever, night sweats or weight changes.  Cardiovascular:  +++ chest pain and dyspnea on exertion, no edema, orthopnea, palpitations, paroxysmal nocturnal dyspnea. Dermatological: No rash,  lesions/masses Respiratory: No cough, +++ dyspnea Urologic: No hematuria, dysuria Abdominal:   Occas noted brb on toilet paper - believes it's 2/2 hemorrhoids.  No nausea, vomiting, diarrhea, melena, or hematemesis Neurologic:  No visual changes, wkns, changes in mental status. All other systems reviewed and are otherwise negative except as noted above.  Physical Exam  Blood pressure 130/56, pulse 64, temperature 98.2 F (36.8 C), temperature source Oral, resp. rate 15, height 4' 11.84" (1.52 m), weight 180 lb (81.647 kg), SpO2 97.00%.  General: Pleasant, NAD Psych: Normal affect. Neuro: Alert and oriented X 3. Moves all extremities spontaneously. HEENT: Normal  Neck: Supple without bruits or JVD. Lungs:  Resp regular and unlabored, CTA. Heart: RRR no s3, s4, or murmurs. Abdomen: Soft, non-tender, non-distended, BS + x 4.  Extremities: No clubbing, cyanosis, trace bilat LEE. DP/PT/Radials 2+ and equal bilaterally.  Labs  Trop i, poc: 0.96  Lab Results  Component Value Date   WBC 8.1 11/25/2012   HGB 14.1 11/25/2012   HCT 43.0 11/25/2012   MCV 81.6 11/25/2012   PLT 227 11/25/2012     Recent Labs Lab 11/25/12 0135  NA 137  K 4.6  CL 100  CO2 27  BUN 16  CREATININE 0.88  CALCIUM 9.9  PROT 7.4  BILITOT 0.4  ALKPHOS 64  ALT 21  AST 37  GLUCOSE 211*   Lab Results  Component Value Date   LDLCALC 90 09/03/2012   TRIG 527* 09/03/2012   Lab Results  Component Value Date   DDIMER 0.67* 11/25/2012    Radiology/Studies  Dg Chest 2 View  11/25/2012   *RADIOLOGY REPORT*  Clinical Data: Chest pain  CHEST - 2 VIEW  Comparison: Prior radiograph from 10/07/2007  Findings: Cardiac and mediastinal silhouettes are stable in size and contour, and remain within normal limits.  The lungs are normally inflated.  No airspace consolidation, pleural effusion, or pulmonary edema is identified.  There is no pneumothorax.  9 mm nodular opacity overlying the left mid lung is grossly stable as compared  to the prior examination.  No acute osseous abnormality identified.  IMPRESSION:  No acute cardiopulmonary process.   Original Report Authenticated By: Rise Mu, M.D.   ECG  Rsr, 64, diff ST dep, more pronounced in ant leads than on initial ECG.  ASSESSMENT AND PLAN  1. NSTEMI:  Pt presents with a 10 day h/o progressive rest and exertional sscp and aching and has been found to have diffuse ST depression, worse in ant leads, and an elevated troponin.  She is currently pain free.  Cont asa, bb, heparin.  Add high  potency statin and IV ntg.  Plan on cath this AM.  2.  HTN:  Stable.  Cont home dose of bb.  3.  HL:  LDL of 90 in June.  TG 527.  Repeat fasting lipids tomorrow AM.  Add zetia - pt intolerant to multiple statins.  LFT's wnl this AM.  4.  DM II: hold metformin.  Add ssi.  5.  Elevated D Dimer:  If cath unrevealing, will seek CTA chest.  6.  Hypothyroidism:  Cont synthroid.  Nl TSH in June.  Signed, Nicolasa Ducking, NP 11/25/2012, 6:33 AM  Patient seen and examined independently. Gilford Raid, NP note reviewed carefully - agree with his assessment and plan. I have edited the note based on my findings. She presents with NSTEMI. ECG with progressive anterior ST depression. Now CP free. Will need cath this am. Treat with ASA, heparin, statin, NTG, b-blocker. Hold metformin.   Truman Hayward 7:33 AM

## 2012-11-25 NOTE — Care Management Note (Addendum)
    Page 1 of 2   12/02/2012     2:37:33 PM   CARE MANAGEMENT NOTE 12/02/2012  Patient:  Leah Levine   Account Number:  [id number]  Date Initiated:  11/25/2012  Documentation initiated by:  Junius Creamer  Subjective/Objective Assessment:   adm w mi     Action/Plan:   lives w husband, pcp dr don Christell Constant   Anticipated DC Date:  12/03/2012   Anticipated DC Plan:  HOME W HOME HEALTH SERVICES      DC Planning Services  CM consult      [facility] Choice  HOME HEALTH   Choice offered to / List presented to:  C-1 Patient        HH arranged  HH-1 RN  HH-2 PT      [facility] [address] agency  [facility]   Status of service:  Completed, signed off Medicare Important Message given?   (If response is "NO", the following Medicare IM given date fields will be blank) Date Medicare IM given:   Date Additional Medicare IM given:    Discharge Disposition:  HOME W HOME HEALTH SERVICES  Per UR Regulation:  Reviewed for med. necessity/level of care/duration of stay  If discussed at Long Length of Stay Meetings, dates discussed:   12/02/2012    Comments:  12/02/12 Brittney Mucha,RN,BSN [phone] PT FOR DC HOME TODAY WITH SISTERS.  NEEDS HH FOLLOW UP. REFERRAL TO [facility], PER PT CHOICE.  START OF CARE 24-48H POST DC DATE.  PT CELL [phone]; SISTER JOAN'S CELL [phone]. PT HAS RW, CANE, BSC AND SHOWER CANE AT HOME, IF NEEDED.  12/01/12 Journe Hallmark,RN,BSN [phone] MET WITH PT AND SISTER TO DISCUSS DC PLANS.  PT PLANS TO DC WITH DAUGHTER AND DTR IN LAW TO PROVIDE 24HR CARE AT DC. SHE ALSO PLANS TO GO TO STAY WITH SISTER FOR PART OF HER RECOVERY.  SISTER, JOAN HESS AT BEDSIDE, STATED DAUGHTER AT HOME HAS SOME MEDICAL ISSUES OF HER OWN, BUT CAN ASSIST WITH PT.  HESS STATES DAUGHTER AND GRANDCHILDREN ARE QUITE CONTROLLING WITH PT, AND SHE HAS CONCERNS ABOUT THIS. ENCOURAGED SISTER TO TALK WITH PT ABOUT THESE ISSUES.

## 2012-11-25 NOTE — Consult Note (Addendum)
Referring Physician:     Chief Complaint: CODE STROKE: CONFUSION, DYSARTHRIA, RIGHT SIDED WEAKNESS.  HPI:                                                                                                                                         Leah Levine is an 79 y.o. female with a past medical history significant for hyperlipidemia, DM type 2, HTN (patient denies ever having hypertension), hypothyroidism, GERD, who initially presented to [facility] with chest pain and was found to have abnormal EKG with elevated troponin. Transferred to [facility] for further management and had an unsuccessful cardiac cath this afternoon that revealed severe 3 vessel disease with preserved EF and has been scheduled for CABG in the morning. Patient was last known well by family around 4:30 pm and approximately at 9:30 was noted to be confused, weak in the right arm greater than the right leg, and with slurred speech. She has been off IV heparin since her cath this afternoon. According to nursing staff, her speech and right sided weakness started improving within the last 30 minutes. At this moment she has NIHSS 3. CT brain showed no acute abnormality.   Date last known well:  Time last known well:  tPA Given: no, out of the window, cardiac cath few hours ago. NIHSS:  MRS: 0  Past Medical History  Diagnosis Date  . Tremor   . GERD (gastroesophageal reflux disease)   . Hyperlipidemia   . Allergy   . Urinary incontinence   . Diabetes mellitus type 2, controlled   . Hypertension     patient denies ever having hypertension  . Personal history of colonic polyps 11/20/2010    tubular adenomas  . Hypothyroidism   . Anemia   . Skin cancer     ear, right side of cheek    Past Surgical History  Procedure Laterality Date  . Appendectomy    . Cholecystectomy    . Abdominal hysterectomy    . Knee arthroscopy      bilateral  . Carpal tunnel release    . Back surgery    . Gallbladder surgery    . Knees Bilateral      Family History  Problem Relation Age of Onset  . Ovarian cancer Mother   . Diabetes Father   . Pneumonia Father   . Diabetes Sister    Social History:  reports that she has never smoked. She has never used smokeless tobacco. She reports that she does not drink alcohol or use illicit drugs.  Allergies:  Allergies  Allergen Reactions  . Celebrex [Celecoxib]     Muscle aches  . Crestor [Rosuvastatin Calcium]     Muscle aches  . Fenofibrate     Muscle aches  . Hydrocodone   . Lipitor [Atorvastatin Calcium]     Muscle aches  . Simvastatin     Muscle aches  Medications:                                                                                                                           I have reviewed the patient's current medications.  ROS: unable to obtain due to confusion                                                                                                                                      History obtained from chart review, nursing staff.    Physical exam: pleasant female in no apparent distress. Blood pressure 108/42, pulse 64, temperature 98.6 F (37 C), temperature source Oral, resp. rate 16, height 4' 11.84" (1.52 m), weight 81.647 kg (180 lb), SpO2 99.00%. Head: normocephalic. Neck: supple, no bruits, no JVD. Cardiac: no murmurs. Lungs: clear. Abdomen: soft, no tender, no mass. Extremities: no edema.   Neurologic Examination:                                                                                                      Mental Status: Alert and awake, oriented to place and person but doesn't know year-month.  Speech fluent without evidence of aphasia.  Able to follow 3 step commands without difficulty. Cranial Nerves: II: Discs flat bilaterally; Visual fields grossly normal, pupils equal, round, reactive to light and accommodation III,IV, VI: ptosis not present, extra-ocular motions intact bilaterally V,VII: smile symmetric, facial  light touch sensation normal bilaterally VIII: hearing normal bilaterally IX,X: gag reflex present XI: bilateral shoulder shrug XII: midline tongue extension Motor: Mild weakness right arm and leg. Tone and bulk:normal tone throughout; no atrophy noted Sensory: Pinprick and light touch intact throughout, bilaterally Deep Tendon Reflexes:  1 all over Plantars: Right: downgoing   Left: downgoing Cerebellar: normal finger-to-nose,  normal heel-to-shin test Gait:  Unable to test. CV: pulses palpable throughout     Results for orders placed during the hospital encounter of 11/25/12 (from the past 48 hour(s))  CBC WITH DIFFERENTIAL     Status: Abnormal   Collection Time    11/25/12  1:35 AM      Result Value Range   WBC 8.1  4.0 - 10.5 K/uL   RBC 5.27 (*) 3.87 - 5.11 MIL/uL   Hemoglobin 14.1  12.0 - 15.0 g/dL   HCT 16.1  09.6 - 04.5 %   MCV 81.6  78.0 - 100.0 fL   MCH 26.8  26.0 - 34.0 pg   MCHC 32.8  30.0 - 36.0 g/dL   RDW 40.9  81.1 - 91.4 %   Platelets 227  150 - 400 K/uL   Neutrophils Relative % 58  43 - 77 %   Neutro Abs 4.7  1.7 - 7.7 K/uL   Lymphocytes Relative 29  12 - 46 %   Lymphs Abs 2.4  0.7 - 4.0 K/uL   Monocytes Relative 10  3 - 12 %   Monocytes Absolute 0.8  0.1 - 1.0 K/uL   Eosinophils Relative 3  0 - 5 %   Eosinophils Absolute 0.3  0.0 - 0.7 K/uL   Basophils Relative 0  0 - 1 %   Basophils Absolute 0.0  0.0 - 0.1 K/uL  COMPREHENSIVE METABOLIC PANEL     Status: Abnormal   Collection Time    11/25/12  1:35 AM      Result Value Range   Sodium 137  135 - 145 mEq/L   Potassium 4.6  3.5 - 5.1 mEq/L   Comment: SLIGHT HEMOLYSIS   Chloride 100  96 - 112 mEq/L   CO2 27  19 - 32 mEq/L   Glucose, Bld 211 (*) 70 - 99 mg/dL   BUN 16  6 - 23 mg/dL   Creatinine, Ser 7.82  0.50 - 1.10 mg/dL   Calcium 9.9  8.4 - 95.6 mg/dL   Total Protein 7.4  6.0 - 8.3 g/dL   Albumin 3.7  3.5 - 5.2 g/dL   AST 37  0 - 37 U/L   ALT 21  0 - 35 U/L   Alkaline Phosphatase 64  39 -  117 U/L   Total Bilirubin 0.4  0.3 - 1.2 mg/dL   GFR calc non Af Amer 61 (*) >90 mL/min   GFR calc Af Amer 71 (*) >90 mL/min   Comment: (NOTE)     The eGFR has been calculated using the CKD EPI equation.     This calculation has not been validated in all clinical situations.     eGFR's persistently <90 mL/min signify possible Chronic Kidney     Disease.  URINALYSIS, ROUTINE W REFLEX MICROSCOPIC     Status: Abnormal   Collection Time    11/25/12  2:09 AM      Result Value Range   Color, Urine YELLOW  YELLOW   APPearance CLOUDY (*) CLEAR   Specific Gravity, Urine 1.024  1.005 - 1.030   pH 5.0  5.0 - 8.0   Glucose, UA 250 (*) NEGATIVE mg/dL   Hgb urine dipstick NEGATIVE  NEGATIVE   Bilirubin Urine NEGATIVE  NEGATIVE   Ketones, ur NEGATIVE  NEGATIVE mg/dL   Protein, ur NEGATIVE  NEGATIVE mg/dL   Urobilinogen, UA 0.2  0.0 - 1.0 mg/dL   Nitrite NEGATIVE  NEGATIVE   Leukocytes, UA MODERATE (*) NEGATIVE  URINE MICROSCOPIC-ADD ON     Status: Abnormal   Collection Time    11/25/12  2:09 AM      Result Value Range  Squamous Epithelial / LPF FEW (*) RARE   WBC, UA 3-6  <3 WBC/hpf   RBC / HPF 0-2  <3 RBC/hpf   Bacteria, UA FEW (*) RARE  POCT I-STAT TROPONIN I     Status: Abnormal   Collection Time    11/25/12  2:18 AM      Result Value Range   Troponin i, poc 0.96 (*) 0.00 - 0.08 ng/mL   Comment 3            Comment: Due to the release kinetics of cTnI,     a negative result within the first hours     of the onset of symptoms does not rule out     myocardial infarction with certainty.     If myocardial infarction is still suspected,     repeat the test at appropriate intervals.  D-DIMER, QUANTITATIVE     Status: Abnormal   Collection Time    11/25/12  2:24 AM      Result Value Range   D-Dimer, Quant 0.67 (*) 0.00 - 0.48 ug/mL-FEU   Comment:            AT THE INHOUSE ESTABLISHED CUTOFF     VALUE OF 0.48 ug/mL FEU,     THIS ASSAY HAS BEEN DOCUMENTED     IN THE LITERATURE TO  HAVE     A SENSITIVITY AND NEGATIVE     PREDICTIVE VALUE OF AT LEAST     98 TO 99%.  THE TEST RESULT     SHOULD BE CORRELATED WITH     AN ASSESSMENT OF THE CLINICAL     PROBABILITY OF DVT / VTE.  APTT     Status: None   Collection Time    11/25/12  2:24 AM      Result Value Range   aPTT 30  24 - 37 seconds  PROTIME-INR     Status: None   Collection Time    11/25/12  2:24 AM      Result Value Range   Prothrombin Time 12.6  11.6 - 15.2 seconds   INR 0.96  0.00 - 1.49  OCCULT BLOOD, POC DEVICE     Status: None   Collection Time    11/25/12  4:31 AM      Result Value Range   Fecal Occult Bld NEGATIVE  NEGATIVE  MRSA PCR SCREENING     Status: None   Collection Time    11/25/12  6:09 AM      Result Value Range   MRSA by PCR NEGATIVE  NEGATIVE   Comment:            The GeneXpert MRSA Assay (FDA     approved for NASAL specimens     only), is one component of a     comprehensive MRSA colonization     surveillance program. It is not     intended to diagnose MRSA     infection nor to guide or     monitor treatment for     MRSA infections.  TROPONIN I     Status: Abnormal   Collection Time    11/25/12  6:29 AM      Result Value Range   Troponin I 0.97 (*) <0.30 ng/mL   Comment:            Due to the release kinetics of cTnI,     a negative result within the first hours     of the onset  of symptoms does not rule out     myocardial infarction with certainty.     If myocardial infarction is still suspected,     repeat the test at appropriate intervals.     REPEATED TO VERIFY     CRITICAL RESULT CALLED TO, READ BACK BY AND VERIFIED WITH:     MELVIN KUFFOUR,RN [phone] 11/25/12 CLARK,S  LIPID PANEL     Status: Abnormal   Collection Time    11/25/12  6:29 AM      Result Value Range   Cholesterol 187  0 - 200 mg/dL   Triglycerides 540 (*) <150 mg/dL   HDL 27 (*) >98 mg/dL   Total CHOL/HDL Ratio 6.9     VLDL 43 (*) 0 - 40 mg/dL   LDL Cholesterol 119 (*) 0 - 99 mg/dL   Comment:             Total Cholesterol/HDL:CHD Risk     Coronary Heart Disease Risk Table                         Men   Women      1/2 Average Risk   3.4   3.3      Average Risk       5.0   4.4      2 X Average Risk   9.6   7.1      3 X Average Risk  23.4   11.0                Use the calculated Patient Ratio     above and the CHD Risk Table     to determine the patient's CHD Risk.                ATP III CLASSIFICATION (LDL):      <100     mg/dL   Optimal      147-829  mg/dL   Near or Above                        Optimal      130-159  mg/dL   Borderline      562-130  mg/dL   High      >865     mg/dL   Very High  GLUCOSE, CAPILLARY     Status: Abnormal   Collection Time    11/25/12  7:46 AM      Result Value Range   Glucose-Capillary 173 (*) 70 - 99 mg/dL  HEPARIN LEVEL (UNFRACTIONATED)     Status: None   Collection Time    11/25/12 12:25 PM      Result Value Range   Heparin Unfractionated 0.69  0.30 - 0.70 IU/mL   Comment:            IF HEPARIN RESULTS ARE BELOW     EXPECTED VALUES, AND PATIENT     DOSAGE HAS BEEN CONFIRMED,     SUGGEST FOLLOW UP TESTING     OF ANTITHROMBIN III LEVELS.  GLUCOSE, CAPILLARY     Status: Abnormal   Collection Time    11/25/12 12:56 PM      Result Value Range   Glucose-Capillary 199 (*) 70 - 99 mg/dL  TROPONIN I     Status: Abnormal   Collection Time    11/25/12  4:20 PM      Result Value Range   Troponin I 0.69 (*) <  0.30 ng/mL   Comment:            Due to the release kinetics of cTnI,     a negative result within the first hours     of the onset of symptoms does not rule out     myocardial infarction with certainty.     If myocardial infarction is still suspected,     repeat the test at appropriate intervals.     REPEATED TO VERIFY     CRITICAL VALUE NOTED.  VALUE IS CONSISTENT WITH PREVIOUSLY REPORTED AND CALLED VALUE.  PROTIME-INR     Status: None   Collection Time    11/25/12  4:20 PM      Result Value Range   Prothrombin Time 12.7  11.6  - 15.2 seconds   INR 0.97  0.00 - 1.49  TYPE AND SCREEN     Status: None   Collection Time    11/25/12  4:20 PM      Result Value Range   ABO/RH(D) A POS     Antibody Screen POS     Sample Expiration 11/28/2012     Antibody Identification ANTI-E     DAT, IgG NEG     Unit Number [id number]     Blood Component Type RED CELLS,LR     Unit division 00     Status of Unit ALLOCATED     Donor AG Type NEGATIVE FOR E ANTIGEN     Transfusion Status OK TO TRANSFUSE     Crossmatch Result COMPATIBLE     Unit Number [id number]     Blood Component Type RED CELLS,LR     Unit division 00     Status of Unit ALLOCATED     Donor AG Type NEGATIVE FOR E ANTIGEN     Transfusion Status OK TO TRANSFUSE     Crossmatch Result COMPATIBLE     Unit Number [id number]     Blood Component Type RED CELLS,LR     Unit division 00     Status of Unit ALLOCATED     Donor AG Type NEGATIVE FOR E ANTIGEN     Transfusion Status OK TO TRANSFUSE     Crossmatch Result COMPATIBLE     Unit Number [id number]     Blood Component Type RED CELLS,LR     Unit division 00     Status of Unit ALLOCATED     Donor AG Type NEGATIVE FOR E ANTIGEN     Transfusion Status OK TO TRANSFUSE     Crossmatch Result COMPATIBLE     Unit Number [id number]     Blood Component Type RED CELLS,LR     Unit division 00     Status of Unit ALLOCATED     Transfusion Status OK TO TRANSFUSE     Crossmatch Result COMPATIBLE     Unit Number [id number]     Blood Component Type RED CELLS,LR     Unit division 00     Status of Unit ALLOCATED     Transfusion Status OK TO TRANSFUSE     Crossmatch Result COMPATIBLE    GLUCOSE, CAPILLARY     Status: Abnormal   Collection Time    11/25/12  4:56 PM      Result Value Range   Glucose-Capillary 211 (*) 70 - 99 mg/dL  TROPONIN I     Status: Abnormal   Collection Time    11/25/12  7:23 PM      Result Value Range  Troponin I 0.70 (*) <0.30 ng/mL   Comment:            Due to the release  kinetics of cTnI,     a negative result within the first hours     of the onset of symptoms does not rule out     myocardial infarction with certainty.     If myocardial infarction is still suspected,     repeat the test at appropriate intervals.     REPEATED TO VERIFY     CRITICAL VALUE NOTED.  VALUE IS CONSISTENT WITH PREVIOUSLY REPORTED AND CALLED VALUE.   Dg Chest 2 View  11/25/2012   *RADIOLOGY REPORT*  Clinical Data: Chest pain  CHEST - 2 VIEW  Comparison: Prior radiograph from 10/07/2007  Findings: Cardiac and mediastinal silhouettes are stable in size and contour, and remain within normal limits.  The lungs are normally inflated.  No airspace consolidation, pleural effusion, or pulmonary edema is identified.  There is no pneumothorax.  9 mm nodular opacity overlying the left mid lung is grossly stable as compared to the prior examination.  No acute osseous abnormality identified.  IMPRESSION:  No acute cardiopulmonary process.   Original Report Authenticated By: Rise Mu, M.D.       Assessment: 79 y.o. female with acute onset confusion, mild right sided weakness, and dysarthria (now resolved). NIHSS 3. CT brain negative for acute abnormality. Out of the window for thrombolytics and had cardiac cath this afternoon. Possible left subcortical cerebrovascular insult. She is getting better. Continue aspirin.   Stroke Risk Factors - age, HTN, hyperlipidemia, CAD  Plan: 1. HgbA1c, fasting lipid panel 2. MRI, MRA  of the brain without contrast 3. Echocardiogram 4. Carotid dopplers 5. Prophylactic therapy-Antiplatelet med: Aspirin - dose 81 mg 6. Risk factor modification 7. Telemetry monitoring 8. Frequent neuro checks 9. PT/OT SLP    Wyatt Portela, MD [facility] [phone]  11/25/2012, 11:00 PM

## 2012-11-26 ENCOUNTER — Inpatient Hospital Stay (HOSPITAL_COMMUNITY): Payer: Medicare Other

## 2012-11-26 ENCOUNTER — Encounter (HOSPITAL_COMMUNITY)
Admission: EM | Disposition: A | Discharge status: Home / Self Care | Payer: Federal, State, Local not specified - PPO | Source: Home / Self Care | Attending: Cardiothoracic Surgery

## 2012-11-26 ENCOUNTER — Encounter (HOSPITAL_COMMUNITY): Payer: Self-pay | Admitting: Certified Registered Nurse Anesthetist

## 2012-11-26 ENCOUNTER — Inpatient Hospital Stay (HOSPITAL_COMMUNITY): Payer: Medicare Other | Admitting: Certified Registered Nurse Anesthetist

## 2012-11-26 DIAGNOSIS — I2510 Atherosclerotic heart disease of native coronary artery without angina pectoris: Secondary | ICD-10-CM

## 2012-11-26 HISTORY — PX: CORONARY ARTERY BYPASS GRAFT: SHX141

## 2012-11-26 HISTORY — PX: INTRAOPERATIVE TRANSESOPHAGEAL ECHOCARDIOGRAM: SHX5062

## 2012-11-26 LAB — CBC
HCT: 38.8 % (ref 36.0–46.0)
HCT: 33.2 % — ABNORMAL LOW (ref 36.0–46.0)
HCT: 30.3 % — ABNORMAL LOW (ref 36.0–46.0)
Hemoglobin: 12.9 g/dL (ref 12.0–15.0)
Hemoglobin: 13 g/dL (ref 12.0–15.0)
Hemoglobin: 11.1 g/dL — ABNORMAL LOW (ref 12.0–15.0)
Hemoglobin: 10.2 g/dL — ABNORMAL LOW (ref 12.0–15.0)
MCH: 27 pg (ref 26.0–34.0)
MCH: 27.5 pg (ref 26.0–34.0)
MCH: 27.1 pg (ref 26.0–34.0)
MCHC: 33.5 g/dL (ref 30.0–36.0)
MCHC: 33.4 g/dL (ref 30.0–36.0)
MCHC: 33.7 g/dL (ref 30.0–36.0)
MCV: 82 fL (ref 78.0–100.0)
MCV: 80.4 fL (ref 78.0–100.0)
Platelets: 176 10*3/uL (ref 150–400)
Platelets: 160 10*3/uL (ref 150–400)
Platelets: 134 10*3/uL — ABNORMAL LOW (ref 150–400)
RBC: 4.78 MIL/uL (ref 3.87–5.11)
RBC: 4.73 MIL/uL (ref 3.87–5.11)
RBC: 4.16 MIL/uL (ref 3.87–5.11)
RBC: 3.77 MIL/uL — ABNORMAL LOW (ref 3.87–5.11)
RDW: 15.1 % (ref 11.5–15.5)
RDW: 14.8 % (ref 11.5–15.5)
WBC: 7.4 10*3/uL (ref 4.0–10.5)
WBC: 8 10*3/uL (ref 4.0–10.5)
WBC: 14.3 10*3/uL — ABNORMAL HIGH (ref 4.0–10.5)

## 2012-11-26 LAB — POCT I-STAT 3, ART BLOOD GAS (G3+)
Acid-Base Excess: 1 mmol/L (ref 0.0–2.0)
Acid-base deficit: 3 mmol/L — ABNORMAL HIGH (ref 0.0–2.0)
Acid-base deficit: 5 mmol/L — ABNORMAL HIGH (ref 0.0–2.0)
Bicarbonate: 26.7 mEq/L — ABNORMAL HIGH (ref 20.0–24.0)
Bicarbonate: 20.9 mEq/L (ref 20.0–24.0)
Bicarbonate: 19.5 mEq/L — ABNORMAL LOW (ref 20.0–24.0)
O2 Saturation: 100 %
Patient temperature: 36.1
TCO2: 22 mmol/L (ref 0–100)
pCO2 arterial: 45.9 mmHg — ABNORMAL HIGH (ref 35.0–45.0)
pH, Arterial: 7.373 (ref 7.350–7.450)
pH, Arterial: 7.394 (ref 7.350–7.450)
pO2, Arterial: 342 mmHg — ABNORMAL HIGH (ref 80.0–100.0)
pO2, Arterial: 223 mmHg — ABNORMAL HIGH (ref 80.0–100.0)

## 2012-11-26 LAB — SURGICAL PCR SCREEN
MRSA, PCR: NEGATIVE
Staphylococcus aureus: NEGATIVE

## 2012-11-26 LAB — PLATELET COUNT: Platelets: 145 10*3/uL — ABNORMAL LOW (ref 150–400)

## 2012-11-26 LAB — POCT I-STAT 4, (NA,K, GLUC, HGB,HCT)
Glucose, Bld: 183 mg/dL — ABNORMAL HIGH (ref 70–99)
Glucose, Bld: 149 mg/dL — ABNORMAL HIGH (ref 70–99)
Glucose, Bld: 178 mg/dL — ABNORMAL HIGH (ref 70–99)
Glucose, Bld: 165 mg/dL — ABNORMAL HIGH (ref 70–99)
HCT: 37 % (ref 36.0–46.0)
HCT: 27 % — ABNORMAL LOW (ref 36.0–46.0)
HCT: 24 % — ABNORMAL LOW (ref 36.0–46.0)
Hemoglobin: 8.5 g/dL — ABNORMAL LOW (ref 12.0–15.0)
Hemoglobin: 9.2 g/dL — ABNORMAL LOW (ref 12.0–15.0)
Potassium: 4.5 mEq/L (ref 3.5–5.1)
Potassium: 4.3 mEq/L (ref 3.5–5.1)
Potassium: 3.9 mEq/L (ref 3.5–5.1)
Sodium: 130 mEq/L — ABNORMAL LOW (ref 135–145)
Sodium: 135 mEq/L (ref 135–145)
Sodium: 137 mEq/L (ref 135–145)
Sodium: 141 mEq/L (ref 135–145)

## 2012-11-26 LAB — LIPID PANEL
Cholesterol: 161 mg/dL (ref 0–200)
HDL: 23 mg/dL — ABNORMAL LOW (ref >39)
LDL Cholesterol: 71 mg/dL (ref 0–99)
Total CHOL/HDL Ratio: 7 RATIO
Triglycerides: 333 mg/dL — ABNORMAL HIGH (ref <150)
VLDL: 67 mg/dL — ABNORMAL HIGH (ref 0–40)

## 2012-11-26 LAB — COMPREHENSIVE METABOLIC PANEL
ALT: 18 U/L (ref 0–35)
Alkaline Phosphatase: 48 U/L (ref 39–117)
BUN: 13 mg/dL (ref 6–23)
CO2: 26 mEq/L (ref 19–32)
Chloride: 101 mEq/L (ref 96–112)
GFR calc Af Amer: >90 mL/min (ref >90)
GFR calc non Af Amer: 80 mL/min — ABNORMAL LOW (ref >90)
Glucose, Bld: 178 mg/dL — ABNORMAL HIGH (ref 70–99)
Potassium: 4.4 mEq/L (ref 3.5–5.1)
Sodium: 138 mEq/L (ref 135–145)
Total Bilirubin: 0.4 mg/dL (ref 0.3–1.2)
Total Protein: 6.2 g/dL (ref 6.0–8.3)

## 2012-11-26 LAB — POCT I-STAT, CHEM 8
BUN: 9 mg/dL (ref 6–23)
Chloride: 103 mEq/L (ref 96–112)
Creatinine, Ser: 0.9 mg/dL (ref 0.50–1.10)
Sodium: 138 mEq/L (ref 135–145)
TCO2: 21 mmol/L (ref 0–100)

## 2012-11-26 LAB — PROTIME-INR
INR: 1 (ref 0.00–1.49)
INR: 1.4 (ref 0.00–1.49)

## 2012-11-26 LAB — CREATININE, SERUM
Creatinine, Ser: 0.75 mg/dL (ref 0.50–1.10)
GFR calc Af Amer: >90 mL/min (ref >90)
GFR calc non Af Amer: 79 mL/min — ABNORMAL LOW (ref >90)

## 2012-11-26 LAB — TROPONIN I: Troponin I: 0.63 ng/mL (ref <0.30)

## 2012-11-26 LAB — DIFFERENTIAL
Eosinophils Absolute: 0.2 10*3/uL (ref 0.0–0.7)
Lymphocytes Relative: 23 % (ref 12–46)
Lymphs Abs: 1.7 10*3/uL (ref 0.7–4.0)
Monocytes Relative: 8 % (ref 3–12)
Neutrophils Relative %: 66 % (ref 43–77)

## 2012-11-26 LAB — BASIC METABOLIC PANEL
BUN: 13 mg/dL (ref 6–23)
CO2: 26 mEq/L (ref 19–32)
Calcium: 8.4 mg/dL (ref 8.4–10.5)
Chloride: 99 mEq/L (ref 96–112)
Creatinine, Ser: 0.74 mg/dL (ref 0.50–1.10)
GFR calc Af Amer: >90 mL/min (ref >90)
GFR calc non Af Amer: 79 mL/min — ABNORMAL LOW (ref >90)
Glucose, Bld: 179 mg/dL — ABNORMAL HIGH (ref 70–99)
Potassium: 4.2 mEq/L (ref 3.5–5.1)
Sodium: 134 mEq/L — ABNORMAL LOW (ref 135–145)

## 2012-11-26 LAB — HEMOGLOBIN AND HEMATOCRIT, BLOOD
HCT: 27.1 % — ABNORMAL LOW (ref 36.0–46.0)
Hemoglobin: 9.1 g/dL — ABNORMAL LOW (ref 12.0–15.0)

## 2012-11-26 LAB — MAGNESIUM: Magnesium: 3.2 mg/dL — ABNORMAL HIGH (ref 1.5–2.5)

## 2012-11-26 LAB — GLUCOSE, CAPILLARY: Glucose-Capillary: 171 mg/dL — ABNORMAL HIGH (ref 70–99)

## 2012-11-26 SURGERY — CORONARY ARTERY BYPASS GRAFTING (CABG)
Anesthesia: General | Site: Chest | Wound class: Clean

## 2012-11-26 MED ORDER — LACTATED RINGERS IV SOLN: INTRAVENOUS | Status: DC

## 2012-11-26 MED ORDER — ACETAMINOPHEN 160 MG/5ML PO SOLN
1000.0000 mg | Freq: Four times a day (QID) | ORAL | Status: DC
Start: 2012-11-27 — End: 2012-11-30
  Administered 2012-11-27 (×2): 1000 mg
  Filled 2012-11-26 (×2): qty 40.6

## 2012-11-26 MED ORDER — METOPROLOL TARTRATE 12.5 MG HALF TABLET
12.5000 mg | ORAL_TABLET | Freq: Two times a day (BID) | ORAL | Status: DC
  Administered 2012-11-27 – 2012-11-30 (×7): 12.5 mg via ORAL
  Filled 2012-11-26 (×9): qty 1

## 2012-11-26 MED ORDER — ASPIRIN 81 MG PO CHEW: 324.0000 mg | CHEWABLE_TABLET | Freq: Every day | ORAL | Status: DC

## 2012-11-26 MED ORDER — MILRINONE IN DEXTROSE 20 MG/100ML IV SOLN
0.3000 ug/kg/min | INTRAVENOUS | Status: DC
  Administered 2012-11-26 – 2012-11-27 (×3): 0.3 ug/kg/min via INTRAVENOUS
  Filled 2012-11-26 (×2): qty 100

## 2012-11-26 MED ORDER — SODIUM CHLORIDE 0.9 % IV SOLN
INTRAVENOUS | Status: DC
  Administered 2012-11-26: 11.2 [IU]/h via INTRAVENOUS
  Administered 2012-11-27 (×2): via INTRAVENOUS
  Filled 2012-11-26 (×4): qty 1

## 2012-11-26 MED ORDER — TRAMADOL HCL 50 MG PO TABS
50.0000 mg | ORAL_TABLET | ORAL | Status: DC | PRN
  Administered 2012-11-27 (×3): 50 mg via ORAL
  Administered 2012-11-28 (×2): 100 mg via ORAL
  Filled 2012-11-26 (×2): qty 2
  Filled 2012-11-26 (×3): qty 1

## 2012-11-26 MED ORDER — SODIUM CHLORIDE 0.9 % IV SOLN
10.0000 g | INTRAVENOUS | Status: DC | PRN
  Administered 2012-11-26: 5 g/h via INTRAVENOUS

## 2012-11-26 MED ORDER — SODIUM CHLORIDE 0.9 % IJ SOLN
OROMUCOSAL | Status: DC | PRN
  Administered 2012-11-26 (×3): via TOPICAL

## 2012-11-26 MED ORDER — NITROGLYCERIN IN D5W 200-5 MCG/ML-% IV SOLN
INTRAVENOUS | Status: DC | PRN
  Administered 2012-11-26: 5 ug/min via INTRAVENOUS

## 2012-11-26 MED ORDER — HEPARIN SODIUM (PORCINE) 1000 UNIT/ML IJ SOLN
INTRAMUSCULAR | Status: DC | PRN
  Administered 2012-11-26: 25 mL via INTRAVENOUS

## 2012-11-26 MED ORDER — MAGNESIUM SULFATE 40 MG/ML IJ SOLN
4.0000 g | Freq: Once | INTRAMUSCULAR | Status: AC
  Administered 2012-11-26: 4 g via INTRAVENOUS
  Filled 2012-11-26: qty 100

## 2012-11-26 MED ORDER — SODIUM CHLORIDE 0.45 % IV SOLN
INTRAVENOUS | Status: DC
  Administered 2012-11-26: 15:00:00 via INTRAVENOUS

## 2012-11-26 MED ORDER — CEFUROXIME SODIUM 1.5 G IJ SOLR
1.5000 g | Freq: Two times a day (BID) | INTRAMUSCULAR | Status: AC
  Administered 2012-11-26 – 2012-11-28 (×4): 1.5 g via INTRAVENOUS
  Filled 2012-11-26 (×4): qty 1.5

## 2012-11-26 MED ORDER — DOCUSATE SODIUM 100 MG PO CAPS
200.0000 mg | ORAL_CAPSULE | Freq: Every day | ORAL | Status: DC
  Administered 2012-11-27 – 2012-11-30 (×4): 200 mg via ORAL
  Filled 2012-11-26 (×2): qty 2
  Filled 2012-11-26 (×2): qty 1

## 2012-11-26 MED ORDER — SODIUM CHLORIDE 0.9 % IV SOLN: 250.0000 mL | INTRAVENOUS | Status: DC

## 2012-11-26 MED ORDER — FENTANYL CITRATE 0.05 MG/ML IJ SOLN
INTRAMUSCULAR | Status: DC | PRN
  Administered 2012-11-26 (×7): 100 ug via INTRAVENOUS
  Administered 2012-11-26: 400 ug via INTRAVENOUS
  Administered 2012-11-26 (×2): 200 ug via INTRAVENOUS

## 2012-11-26 MED ORDER — ALBUMIN HUMAN 5 % IV SOLN
INTRAVENOUS | Status: DC | PRN
  Administered 2012-11-26 (×2): via INTRAVENOUS

## 2012-11-26 MED ORDER — DOPAMINE-DEXTROSE 3.2-5 MG/ML-% IV SOLN: 3.0000 ug/kg/min | INTRAVENOUS | Status: DC

## 2012-11-26 MED ORDER — PROTAMINE SULFATE 10 MG/ML IV SOLN
INTRAVENOUS | Status: DC | PRN
  Administered 2012-11-26: 100 mg via INTRAVENOUS
  Administered 2012-11-26: 40 mg via INTRAVENOUS
  Administered 2012-11-26: 100 mg via INTRAVENOUS
  Administered 2012-11-26: 10 mg via INTRAVENOUS

## 2012-11-26 MED ORDER — ACETAMINOPHEN 500 MG PO TABS
1000.0000 mg | ORAL_TABLET | Freq: Four times a day (QID) | ORAL | Status: DC
  Administered 2012-11-27 – 2012-11-30 (×11): 1000 mg via ORAL
  Filled 2012-11-26 (×17): qty 2

## 2012-11-26 MED ORDER — POTASSIUM CHLORIDE 10 MEQ/50ML IV SOLN
10.0000 meq | INTRAVENOUS | Status: AC
  Administered 2012-11-26 (×3): 10 meq via INTRAVENOUS

## 2012-11-26 MED ORDER — SODIUM CHLORIDE 0.9 % IJ SOLN
3.0000 mL | Freq: Two times a day (BID) | INTRAMUSCULAR | Status: DC
  Administered 2012-11-27 – 2012-11-30 (×5): 3 mL via INTRAVENOUS

## 2012-11-26 MED ORDER — ONDANSETRON HCL 4 MG/2ML IJ SOLN
4.0000 mg | Freq: Four times a day (QID) | INTRAMUSCULAR | Status: DC | PRN
  Administered 2012-11-29: 4 mg via INTRAVENOUS
  Filled 2012-11-26: qty 2

## 2012-11-26 MED ORDER — PROPOFOL 10 MG/ML IV BOLUS
INTRAVENOUS | Status: DC | PRN
  Administered 2012-11-26: 100 mg via INTRAVENOUS

## 2012-11-26 MED ORDER — ALBUMIN HUMAN 5 % IV SOLN
250.0000 mL | INTRAVENOUS | Status: AC | PRN
  Administered 2012-11-26: 250 mL via INTRAVENOUS

## 2012-11-26 MED ORDER — LACTATED RINGERS IV SOLN
INTRAVENOUS | Status: DC | PRN
  Administered 2012-11-26: 08:00:00 via INTRAVENOUS

## 2012-11-26 MED ORDER — METOPROLOL TARTRATE 1 MG/ML IV SOLN: 2.5000 mg | INTRAVENOUS | Status: DC | PRN

## 2012-11-26 MED ORDER — SODIUM CHLORIDE 0.9 % IV SOLN
200.0000 ug | INTRAVENOUS | Status: DC | PRN
  Administered 2012-11-26: 0.2 ug/kg/h via INTRAVENOUS

## 2012-11-26 MED ORDER — MIDAZOLAM HCL 2 MG/2ML IJ SOLN: 2.0000 mg | INTRAMUSCULAR | Status: DC | PRN

## 2012-11-26 MED ORDER — ACETAMINOPHEN 650 MG RE SUPP
650.0000 mg | Freq: Once | RECTAL | Status: AC
  Administered 2012-11-26: 650 mg via RECTAL

## 2012-11-26 MED ORDER — PHENYLEPHRINE HCL 10 MG/ML IJ SOLN
10.0000 mg | INTRAVENOUS | Status: DC | PRN
  Administered 2012-11-26: 25 ug/min via INTRAVENOUS

## 2012-11-26 MED ORDER — BISACODYL 10 MG RE SUPP: 10.0000 mg | Freq: Every day | RECTAL | Status: DC

## 2012-11-26 MED ORDER — NITROGLYCERIN IN D5W 200-5 MCG/ML-% IV SOLN: 0.0000 ug/min | INTRAVENOUS | Status: DC

## 2012-11-26 MED ORDER — DOPAMINE-DEXTROSE 3.2-5 MG/ML-% IV SOLN
INTRAVENOUS | Status: DC | PRN
  Administered 2012-11-26: 3 ug/kg/min via INTRAVENOUS

## 2012-11-26 MED ORDER — ACETAMINOPHEN 160 MG/5ML PO SOLN: 650.0000 mg | Freq: Once | ORAL | Status: AC

## 2012-11-26 MED ORDER — HEMOSTATIC AGENTS (NO CHARGE) OPTIME
TOPICAL | Status: DC | PRN
  Administered 2012-11-26: 1 via TOPICAL

## 2012-11-26 MED ORDER — PANTOPRAZOLE SODIUM 40 MG PO TBEC
40.0000 mg | DELAYED_RELEASE_TABLET | Freq: Every day | ORAL | Status: DC
  Administered 2012-11-28 – 2012-11-30 (×3): 40 mg via ORAL
  Filled 2012-11-26 (×2): qty 1

## 2012-11-26 MED ORDER — METOCLOPRAMIDE HCL 5 MG/ML IJ SOLN
10.0000 mg | Freq: Four times a day (QID) | INTRAMUSCULAR | Status: AC
  Administered 2012-11-26 – 2012-11-27 (×4): 10 mg via INTRAVENOUS
  Filled 2012-11-26 (×4): qty 2

## 2012-11-26 MED ORDER — FAMOTIDINE IN NACL 20-0.9 MG/50ML-% IV SOLN
20.0000 mg | Freq: Two times a day (BID) | INTRAVENOUS | Status: AC
  Administered 2012-11-26 – 2012-11-27 (×2): 20 mg via INTRAVENOUS
  Filled 2012-11-26: qty 50

## 2012-11-26 MED ORDER — VANCOMYCIN HCL IN DEXTROSE 1-5 GM/200ML-% IV SOLN
1000.0000 mg | Freq: Once | INTRAVENOUS | Status: AC
  Administered 2012-11-26: 1000 mg via INTRAVENOUS
  Filled 2012-11-26: qty 200

## 2012-11-26 MED ORDER — BISACODYL 5 MG PO TBEC
10.0000 mg | DELAYED_RELEASE_TABLET | Freq: Every day | ORAL | Status: DC
  Administered 2012-11-27 – 2012-11-30 (×4): 10 mg via ORAL
  Filled 2012-11-26 (×4): qty 2

## 2012-11-26 MED ORDER — SODIUM CHLORIDE 0.9 % IV SOLN
100.0000 [IU] | INTRAVENOUS | Status: DC | PRN
  Administered 2012-11-26: 3.7 [IU]/h via INTRAVENOUS

## 2012-11-26 MED ORDER — ROCURONIUM BROMIDE 100 MG/10ML IV SOLN
INTRAVENOUS | Status: DC | PRN
  Administered 2012-11-26: 50 mg via INTRAVENOUS

## 2012-11-26 MED ORDER — INSULIN REGULAR BOLUS VIA INFUSION
0.0000 [IU] | Freq: Three times a day (TID) | INTRAVENOUS | Status: DC
  Filled 2012-11-26: qty 10

## 2012-11-26 MED ORDER — VECURONIUM BROMIDE 10 MG IV SOLR
INTRAVENOUS | Status: DC | PRN
  Administered 2012-11-26: 3 mg via INTRAVENOUS
  Administered 2012-11-26: 5 mg via INTRAVENOUS
  Administered 2012-11-26: 2 mg via INTRAVENOUS
  Administered 2012-11-26: 3 mg via INTRAVENOUS
  Administered 2012-11-26: 2 mg via INTRAVENOUS
  Administered 2012-11-26: 5 mg via INTRAVENOUS

## 2012-11-26 MED ORDER — PHENYLEPHRINE HCL 10 MG/ML IJ SOLN
0.0000 ug/min | INTRAVENOUS | Status: DC
  Filled 2012-11-26: qty 2

## 2012-11-26 MED ORDER — MORPHINE SULFATE 2 MG/ML IJ SOLN: 1.0000 mg | INTRAMUSCULAR | Status: AC | PRN

## 2012-11-26 MED ORDER — SODIUM CHLORIDE 0.9 % IJ SOLN: 3.0000 mL | INTRAMUSCULAR | Status: DC | PRN

## 2012-11-26 MED ORDER — LACTATED RINGERS IV SOLN
INTRAVENOUS | Status: DC | PRN
  Administered 2012-11-26: 09:00:00 via INTRAVENOUS

## 2012-11-26 MED ORDER — LACTATED RINGERS IV SOLN: 500.0000 mL | Freq: Once | INTRAVENOUS | Status: AC | PRN

## 2012-11-26 MED ORDER — MORPHINE SULFATE 2 MG/ML IJ SOLN: 2.0000 mg | INTRAMUSCULAR | Status: DC | PRN

## 2012-11-26 MED ORDER — MIDAZOLAM HCL 5 MG/5ML IJ SOLN
INTRAMUSCULAR | Status: DC | PRN
  Administered 2012-11-26: 3 mg via INTRAVENOUS
  Administered 2012-11-26: 2 mg via INTRAVENOUS
  Administered 2012-11-26 (×2): 4 mg via INTRAVENOUS
  Administered 2012-11-26: 3 mg via INTRAVENOUS
  Administered 2012-11-26: 4 mg via INTRAVENOUS

## 2012-11-26 MED ORDER — LIDOCAINE HCL (CARDIAC) 20 MG/ML IV SOLN
INTRAVENOUS | Status: DC | PRN
  Administered 2012-11-26: 60 mg via INTRAVENOUS

## 2012-11-26 MED ORDER — 0.9 % SODIUM CHLORIDE (POUR BTL) OPTIME
TOPICAL | Status: DC | PRN
  Administered 2012-11-26: 6000 mL

## 2012-11-26 MED ORDER — SODIUM CHLORIDE 0.9 % IV SOLN: INTRAVENOUS | Status: DC

## 2012-11-26 MED ORDER — ASPIRIN EC 325 MG PO TBEC
325.0000 mg | DELAYED_RELEASE_TABLET | Freq: Every day | ORAL | Status: DC
  Administered 2012-11-27 – 2012-11-30 (×4): 325 mg via ORAL
  Filled 2012-11-26 (×4): qty 1

## 2012-11-26 MED ORDER — METOPROLOL TARTRATE 25 MG/10 ML ORAL SUSPENSION
12.5000 mg | Freq: Two times a day (BID) | ORAL | Status: DC
  Filled 2012-11-26 (×9): qty 5

## 2012-11-26 MED ORDER — DEXMEDETOMIDINE HCL IN NACL 200 MCG/50ML IV SOLN: 0.1000 ug/kg/h | INTRAVENOUS | Status: DC

## 2012-11-26 MED ORDER — MILRINONE IN DEXTROSE 20 MG/100ML IV SOLN
0.1250 ug/kg/min | INTRAVENOUS | Status: AC
  Administered 2012-11-26: .3 ug/kg/min via INTRAVENOUS
  Filled 2012-11-26: qty 100

## 2012-11-26 SURGICAL SUPPLY — 116 items
ATTRACTOMAT 16X20 MAGNETIC DRP (DRAPES) ×2 IMPLANT
BAG DECANTER FOR FLEXI CONT (MISCELLANEOUS) ×2 IMPLANT
BANDAGE ELASTIC 4 VELCRO ST LF (GAUZE/BANDAGES/DRESSINGS) ×2 IMPLANT
BANDAGE ELASTIC 6 VELCRO ST LF (GAUZE/BANDAGES/DRESSINGS) ×2 IMPLANT
BANDAGE GAUZE ELAST BULKY 4 IN (GAUZE/BANDAGES/DRESSINGS) ×2 IMPLANT
BLADE STERNUM SYSTEM 6 (BLADE) ×2 IMPLANT
BLADE SURG 11 STRL SS (BLADE) ×2 IMPLANT
BLADE SURG ROTATE 9660 (MISCELLANEOUS) IMPLANT
CANISTER SUCTION 2500CC (MISCELLANEOUS) ×2 IMPLANT
CANN PRFSN .5XCNCT 15X34-48 (MISCELLANEOUS) ×2
CANNULA AORTIC HI-FLOW 6.5M20F (CANNULA) ×2 IMPLANT
CANNULA PRFSN .5XCNCT 15X34-48 (MISCELLANEOUS) ×2 IMPLANT
CANNULA VEN 2 STAGE (MISCELLANEOUS) ×4 IMPLANT
CATH CPB KIT GERHARDT (MISCELLANEOUS) ×2 IMPLANT
CATH THORACIC 28FR (CATHETERS) ×2 IMPLANT
CATH THORACIC 36FR (CATHETERS) IMPLANT
CATH THORACIC 36FR RT ANG (CATHETERS) IMPLANT
CLIP RETRACTION 3.0MM CORONARY (MISCELLANEOUS) ×2 IMPLANT
CLIP TI MEDIUM 24 (CLIP) IMPLANT
CLIP TI WIDE RED SMALL 24 (CLIP) ×2 IMPLANT
CLOTH BEACON ORANGE TIMEOUT ST (SAFETY) ×2 IMPLANT
COVER SURGICAL LIGHT HANDLE (MISCELLANEOUS) ×2 IMPLANT
CRADLE DONUT ADULT HEAD (MISCELLANEOUS) ×2 IMPLANT
DERMABOND ADVANCED (GAUZE/BANDAGES/DRESSINGS) ×3
DERMABOND ADVANCED .7 DNX12 (GAUZE/BANDAGES/DRESSINGS) ×3 IMPLANT
DRAIN CHANNEL 28F RND 3/8 FF (WOUND CARE) ×2 IMPLANT
DRAPE CARDIOVASCULAR INCISE (DRAPES) ×1
DRAPE SLUSH/WARMER DISC (DRAPES) ×2 IMPLANT
DRAPE SRG 135X102X78XABS (DRAPES) ×1 IMPLANT
DRSG AQUACEL AG ADV 3.5X14 (GAUZE/BANDAGES/DRESSINGS) ×8 IMPLANT
DRSG COVADERM 4X14 (GAUZE/BANDAGES/DRESSINGS) ×2 IMPLANT
ELECT BLADE 4.0 EZ CLEAN MEGAD (MISCELLANEOUS) ×2
ELECT REM PT RETURN 9FT ADLT (ELECTROSURGICAL) ×4
ELECTRODE BLDE 4.0 EZ CLN MEGD (MISCELLANEOUS) ×1 IMPLANT
ELECTRODE REM PT RTRN 9FT ADLT (ELECTROSURGICAL) ×2 IMPLANT
GAUZE SPONGE 4X4 16PLY XRAY LF (GAUZE/BANDAGES/DRESSINGS) ×8 IMPLANT
GLOVE BIO SURGEON STRL SZ 6 (GLOVE) IMPLANT
GLOVE BIO SURGEON STRL SZ 6.5 (GLOVE) ×18 IMPLANT
GLOVE BIO SURGEON STRL SZ7 (GLOVE) IMPLANT
GLOVE BIO SURGEON STRL SZ7.5 (GLOVE) IMPLANT
GLOVE BIOGEL PI IND STRL 6 (GLOVE) IMPLANT
GLOVE BIOGEL PI IND STRL 6.5 (GLOVE) ×5 IMPLANT
GLOVE BIOGEL PI IND STRL 7.0 (GLOVE) ×4 IMPLANT
GLOVE BIOGEL PI INDICATOR 6 (GLOVE)
GLOVE BIOGEL PI INDICATOR 6.5 (GLOVE) ×5
GLOVE BIOGEL PI INDICATOR 7.0 (GLOVE) ×4
GOWN STRL NON-REIN LRG LVL3 (GOWN DISPOSABLE) ×16 IMPLANT
HEMOSTAT POWDER SURGIFOAM 1G (HEMOSTASIS) ×6 IMPLANT
HEMOSTAT SURGICEL 2X14 (HEMOSTASIS) ×2 IMPLANT
INSERT FOGARTY 61MM (MISCELLANEOUS) IMPLANT
INSERT FOGARTY XLG (MISCELLANEOUS) IMPLANT
KIT BASIN OR (CUSTOM PROCEDURE TRAY) ×2 IMPLANT
KIT ROOM TURNOVER OR (KITS) ×2 IMPLANT
KIT SUCTION CATH 14FR (SUCTIONS) ×6 IMPLANT
KIT VASOVIEW W/TROCAR VH 2000 (KITS) ×2 IMPLANT
LEAD PACING MYOCARDI (MISCELLANEOUS) ×2 IMPLANT
MARKER GRAFT CORONARY BYPASS (MISCELLANEOUS) ×8 IMPLANT
NS IRRIG 1000ML POUR BTL (IV SOLUTION) ×12 IMPLANT
PACK OPEN HEART (CUSTOM PROCEDURE TRAY) ×2 IMPLANT
PAD ARMBOARD 7.5X6 YLW CONV (MISCELLANEOUS) ×4 IMPLANT
PAD ELECT DEFIB RADIOL ZOLL (MISCELLANEOUS) ×2 IMPLANT
PENCIL BUTTON HOLSTER BLD 10FT (ELECTRODE) ×2 IMPLANT
PUNCH AORTIC ROTATE 4.0MM (MISCELLANEOUS) IMPLANT
PUNCH AORTIC ROTATE 4.5MM 8IN (MISCELLANEOUS) ×2 IMPLANT
PUNCH AORTIC ROTATE 5MM 8IN (MISCELLANEOUS) IMPLANT
SET CARDIOPLEGIA MPS 5001102 (MISCELLANEOUS) ×2 IMPLANT
SOLUTION ANTI FOG 6CC (MISCELLANEOUS) IMPLANT
SPONGE GAUZE 4X4 12PLY (GAUZE/BANDAGES/DRESSINGS) ×4 IMPLANT
SPONGE LAP 18X18 X RAY DECT (DISPOSABLE) ×6 IMPLANT
SPONGE LAP 4X18 X RAY DECT (DISPOSABLE) IMPLANT
SUT BONE WAX W31G (SUTURE) ×4 IMPLANT
SUT ETHIBOND NAB MH 2-0 36IN (SUTURE) ×2 IMPLANT
SUT MNCRL AB 4-0 PS2 18 (SUTURE) ×4 IMPLANT
SUT PROLENE 3 0 SH DA (SUTURE) ×2 IMPLANT
SUT PROLENE 3 0 SH1 36 (SUTURE) ×2 IMPLANT
SUT PROLENE 4 0 RB 1 (SUTURE)
SUT PROLENE 4 0 SH DA (SUTURE) IMPLANT
SUT PROLENE 4 0 TF (SUTURE) ×4 IMPLANT
SUT PROLENE 4-0 RB1 .5 CRCL 36 (SUTURE) IMPLANT
SUT PROLENE 5 0 C 1 36 (SUTURE) IMPLANT
SUT PROLENE 6 0 C 1 30 (SUTURE) ×8 IMPLANT
SUT PROLENE 6 0 CC (SUTURE) ×10 IMPLANT
SUT PROLENE 7 0 BV 1 (SUTURE) IMPLANT
SUT PROLENE 7 0 BV1 MDA (SUTURE) ×8 IMPLANT
SUT PROLENE 7.0 RB 3 (SUTURE) IMPLANT
SUT PROLENE 8 0 BV175 6 (SUTURE) ×2 IMPLANT
SUT SILK  1 MH (SUTURE)
SUT SILK 1 MH (SUTURE) IMPLANT
SUT SILK 2 0 SH CR/8 (SUTURE) IMPLANT
SUT SILK 3 0 SH CR/8 (SUTURE) IMPLANT
SUT STEEL 6MS V (SUTURE) ×2 IMPLANT
SUT STEEL STERNAL CCS#1 18IN (SUTURE) IMPLANT
SUT STEEL SZ 6 DBL 3X14 BALL (SUTURE) ×2 IMPLANT
SUT VIC AB 1 CTX 18 (SUTURE) ×4 IMPLANT
SUT VIC AB 1 CTX 36 (SUTURE)
SUT VIC AB 1 CTX36XBRD ANBCTR (SUTURE) IMPLANT
SUT VIC AB 2-0 CT1 27 (SUTURE)
SUT VIC AB 2-0 CT1 TAPERPNT 27 (SUTURE) IMPLANT
SUT VIC AB 2-0 CTX 27 (SUTURE) IMPLANT
SUT VIC AB 3-0 SH 27 (SUTURE) ×2
SUT VIC AB 3-0 SH 27X BRD (SUTURE) ×2 IMPLANT
SUT VIC AB 3-0 X1 27 (SUTURE) ×2 IMPLANT
SUT VICRYL 4-0 PS2 18IN ABS (SUTURE) IMPLANT
SUTURE E-PAK OPEN HEART (SUTURE) ×2 IMPLANT
SYSTEM SAHARA CHEST DRAIN ATS (WOUND CARE) ×2 IMPLANT
TAPE CLOTH SURG 4X10 WHT LF (GAUZE/BANDAGES/DRESSINGS) ×6 IMPLANT
TOWEL OR 17X24 6PK STRL BLUE (TOWEL DISPOSABLE) ×4 IMPLANT
TOWEL OR 17X26 10 PK STRL BLUE (TOWEL DISPOSABLE) ×4 IMPLANT
TRAY FOLEY CATH 16FRSI W/METER (SET/KITS/TRAYS/PACK) ×2 IMPLANT
TRAY FOLEY IC TEMP SENS 14FR (CATHETERS) ×2 IMPLANT
TRAY FOLEY IC TEMP SENS 16FR (CATHETERS) ×2 IMPLANT
TUBE FEEDING 8FR 16IN STR KANG (MISCELLANEOUS) ×2 IMPLANT
TUBE SUCT INTRACARD DLP 20F (MISCELLANEOUS) ×4 IMPLANT
TUBING INSUFFLATION 10FT LAP (TUBING) ×2 IMPLANT
UNDERPAD 30X30 INCONTINENT (UNDERPADS AND DIAPERS) ×2 IMPLANT
WATER STERILE IRR 1000ML POUR (IV SOLUTION) ×4 IMPLANT

## 2012-11-26 NOTE — Progress Notes (Signed)
  Echocardiogram Echocardiogram Transesophageal has been performed.  Leah Levine, Leah Levine 11/26/2012, 10:24 AM

## 2012-11-26 NOTE — Progress Notes (Signed)
Pt taken to OR via stretcher,with RN and monitor per protocol. Report given to [name],CRNA.Pt's daughter unable to carry belongings, will take to 2300 later in day.  Leah Levine

## 2012-11-26 NOTE — Anesthesia Preprocedure Evaluation (Addendum)
Anesthesia Evaluation  Patient identified by MRN, date of birth, ID band Patient awake    Reviewed: Allergy & Precautions, H&P , NPO status , Patient's Chart, lab work & pertinent test results  Airway Mallampati: II      Dental   Pulmonary  breath sounds clear to auscultation        Cardiovascular hypertension, + CAD Rhythm:Regular Rate:Normal     Neuro/Psych    GI/Hepatic Neg liver ROS, GERD-  ,  Endo/Other  diabetesHypothyroidism   Renal/GU negative Renal ROS     Musculoskeletal   Abdominal   Peds  Hematology   Anesthesia Other Findings   Reproductive/Obstetrics                          Anesthesia Physical Anesthesia Plan  ASA: III  Anesthesia Plan: General   Post-op Pain Management:    Induction: Intravenous  Airway Management Planned: Oral ETT  Additional Equipment: Arterial line and PA Cath  Intra-op Plan:   Post-operative Plan: Post-operative intubation/ventilation  Informed Consent: I have reviewed the patients History and Physical, chart, labs and discussed the procedure including the risks, benefits and alternatives for the proposed anesthesia with the patient or authorized representative who has indicated his/her understanding and acceptance.   Dental advisory given  Plan Discussed with: CRNA, Anesthesiologist and Surgeon  Anesthesia Plan Comments:       Anesthesia Quick Evaluation

## 2012-11-26 NOTE — Progress Notes (Signed)
Patient ID: Leah Levine, female   DOB: 1932-04-12, 79 y.o.   MRN: [id number]  Patient just back from CT of head, no intracranial bleed or obvious stroke Nurse reports patient got " weak all over and had trouble with speech". Now has completely recovered, and at base line like when I saw her this afternoon. Will evaluate in early am and make decision about proceeding with cabg   Delight Ovens MD      [address] [place] [address] Office [phone]   Beeper [phone]  11/26/2012 12:19 AM

## 2012-11-26 NOTE — Progress Notes (Signed)
Mayford Knife, MD and Tyrone Sage, MD made aware of abnormal aPTT level less that 20. Will continue to monitor.

## 2012-11-26 NOTE — Progress Notes (Signed)
TCTS PM   Stable after CABGx4, sedated on vent w/ stable  Hemodynamics, not bleeding

## 2012-11-26 NOTE — Progress Notes (Signed)
Mayford Knife, MD consulted regarding restart of heparin. Will restart heparin per order. Will continue to monitor pt closely.

## 2012-11-26 NOTE — Transfer of Care (Signed)
Immediate Anesthesia Transfer of Care Note  Patient: NYARI OLSSON  Procedure(s) Performed: Procedure(s): CORONARY ARTERY BYPASS GRAFTING (CABG) (N/A) INTRAOPERATIVE TRANSESOPHAGEAL ECHOCARDIOGRAM (N/A)  Patient Location: SICU  Anesthesia Type:General  Level of Consciousness: Patient remains intubated per anesthesia plan  Airway & Oxygen Therapy: Patient remains intubated per anesthesia plan and Patient placed on Ventilator (see vital sign flow sheet for setting)  Post-op Assessment: Report given to PACU RN and Post -op Vital signs reviewed and stable  Post vital signs: Reviewed and stable  Complications: No apparent anesthesia complications

## 2012-11-26 NOTE — Progress Notes (Signed)
Pt able to answer simple questions at this time. Slight right sided weakness still noted, as well as difficulty forming answers to questions. Pt transported to CT scan at this time. Will continue to monitor closely.

## 2012-11-26 NOTE — Preoperative (Signed)
Beta Blockers   Reason not to administer Beta Blockers:Not Applicable 

## 2012-11-26 NOTE — Anesthesia Procedure Notes (Signed)
Procedure Name: Intubation Date/Time: 11/26/2012 8:52 AM Performed by: Orvilla Fus A Pre-anesthesia Checklist: Patient identified, Emergency Drugs available, Suction available, Patient being monitored and Timeout performed Patient Re-evaluated:Patient Re-evaluated prior to inductionOxygen Delivery Method: Circle system utilized Preoxygenation: Pre-oxygenation with 100% oxygen Intubation Type: IV induction Ventilation: Mask ventilation without difficulty Laryngoscope Size: Mac and 3 Grade View: Grade I Tube type: Oral Tube size: 7.0 mm Airway Equipment and Method: Stylet Placement Confirmation: ETT inserted through vocal cords under direct vision,  positive ETCO2 and breath sounds checked- equal and bilateral Secured at: 21 cm Tube secured with: Tape Dental Injury: Teeth and Oropharynx as per pre-operative assessment

## 2012-11-26 NOTE — Progress Notes (Addendum)
Patient neuro exam this am is as was yesterday, awake and follows commands patient is rt handed , understanding of time and location are intact. No further trouble with speech  Will plan to proceed with planned surgery  Delight Ovens MD      [address] [place] [address] Office [phone]   Beeper [phone]  11/26/2012 6:41 AM

## 2012-11-26 NOTE — Brief Op Note (Addendum)
      [address]       [place] [address]             [phone]        12:26 PM  PATIENT:  Leah Levine  79 y.o. female  PRE-OPERATIVE DIAGNOSIS:  CAD  POST-OPERATIVE DIAGNOSIS:  CAD  PROCEDURE: INTRAOPERATIVE TRANSESOPHAGEAL ECHOCARDIOGRAM, MEDIAN STERNOTOMY for   CORONARY ARTERY BYPASS GRAFTING (CABG) x 4 (LIMA to LAD, SVG to DIAGONAL, SVG to CIRCUMFLEX, and SVG to RCA) with EVH from the right thigh and lower leg   SURGEON:  Surgeon(s) and Role:    * Delight Ovens, MD - Primary  PHYSICIAN ASSISTANT: Doree Fudge PA-C  ANESTHESIA:   general  EBL:  Total I/O In: 1600 [I.V.:1600] Out: 475 [Urine:475]  DRAINS: Chest Tube(s) in the Mediastinal and pleural spaces   COUNTS CORRECT:  YES   DICTATION: .Dragon Dictation  PLAN OF CARE: Admit to inpatient   PATIENT DISPOSITION:  ICU - intubated and hemodynamically stable.   Delay start of Pharmacological VTE agent (>24hrs) due to surgical blood loss or risk of bleeding: yes  PRE OP WEIGHT: 84 kg

## 2012-11-26 NOTE — Progress Notes (Signed)
SUBJECTIVE:  Still very slight confusion but knows place and time and person.  Exam nonfocal.     PHYSICAL EXAM Filed Vitals:   11/26/12 0400 11/26/12 0451 11/26/12 0500 11/26/12 0600  BP: 108/71  143/60 110/44  Pulse: 62  64 57  Temp: 98.6 F (37 C)     TempSrc: Oral     Resp: 15  16 15   Height:      Weight:  184 lb 15.5 oz (83.9 kg)    SpO2: 97%  97% 98%   General:  No distress Lungs:  Decreased breath sounds Heart:  RRR Abdomen:  Positive bowel sounds, no rebound no guarding Extremities:  No edema  LABS: Lab Results  Component Value Date   TROPONINI 0.63* 11/26/2012   Results for orders placed during the hospital encounter of 11/25/12 (from the past 24 hour(s))  GLUCOSE, CAPILLARY     Status: Abnormal   Collection Time    11/25/12  7:46 AM      Result Value Range   Glucose-Capillary 173 (*) 70 - 99 mg/dL  HEPARIN LEVEL (UNFRACTIONATED)     Status: None   Collection Time    11/25/12 12:25 PM      Result Value Range   Heparin Unfractionated 0.69  0.30 - 0.70 IU/mL  GLUCOSE, CAPILLARY     Status: Abnormal   Collection Time    11/25/12 12:56 PM      Result Value Range   Glucose-Capillary 199 (*) 70 - 99 mg/dL  TROPONIN I     Status: Abnormal   Collection Time    11/25/12  4:20 PM      Result Value Range   Troponin I 0.69 (*) <0.30 ng/mL  PROTIME-INR     Status: None   Collection Time    11/25/12  4:20 PM      Result Value Range   Prothrombin Time 12.7  11.6 - 15.2 seconds   INR 0.97  0.00 - 1.49  TYPE AND SCREEN     Status: None   Collection Time    11/25/12  4:20 PM      Result Value Range   ABO/RH(D) A POS     Antibody Screen POS     Sample Expiration 11/28/2012     Antibody Identification ANTI-E     DAT, IgG NEG     Unit Number [id number]     Blood Component Type RED CELLS,LR     Unit division 00     Status of Unit ALLOCATED     Donor AG Type NEGATIVE FOR E ANTIGEN     Transfusion Status OK TO TRANSFUSE     Crossmatch Result COMPATIBLE     Unit Number [id number]     Blood Component Type RED CELLS,LR     Unit division 00     Status of Unit ALLOCATED     Donor AG Type NEGATIVE FOR E ANTIGEN     Transfusion Status OK TO TRANSFUSE     Crossmatch Result COMPATIBLE     Unit Number [id number]     Blood Component Type RED CELLS,LR     Unit division 00     Status of Unit ALLOCATED     Donor AG Type NEGATIVE FOR E ANTIGEN     Transfusion Status OK TO TRANSFUSE     Crossmatch Result COMPATIBLE     Unit Number [id number]     Blood Component Type RED CELLS,LR     Unit division 00  Status of Unit ALLOCATED     Donor AG Type NEGATIVE FOR E ANTIGEN     Transfusion Status OK TO TRANSFUSE     Crossmatch Result COMPATIBLE     Unit Number [id number]     Blood Component Type RED CELLS,LR     Unit division 00     Status of Unit ALLOCATED     Transfusion Status OK TO TRANSFUSE     Crossmatch Result COMPATIBLE     Unit Number [id number]     Blood Component Type RED CELLS,LR     Unit division 00     Status of Unit ALLOCATED     Transfusion Status OK TO TRANSFUSE     Crossmatch Result COMPATIBLE    GLUCOSE, CAPILLARY     Status: Abnormal   Collection Time    11/25/12  4:56 PM      Result Value Range   Glucose-Capillary 211 (*) 70 - 99 mg/dL  TROPONIN I     Status: Abnormal   Collection Time    11/25/12  7:23 PM      Result Value Range   Troponin I 0.70 (*) <0.30 ng/mL  PROTIME-INR     Status: None   Collection Time    11/26/12 12:12 AM      Result Value Range   Prothrombin Time 13.0  11.6 - 15.2 seconds   INR 1.00  0.00 - 1.49  APTT     Status: Abnormal   Collection Time    11/26/12 12:12 AM      Result Value Range   aPTT <20 (*) 24 - 37 seconds  CBC     Status: None   Collection Time    11/26/12 12:12 AM      Result Value Range   WBC 7.4  4.0 - 10.5 K/uL   RBC 4.78  3.87 - 5.11 MIL/uL   Hemoglobin 12.9  12.0 - 15.0 g/dL   HCT 96.2  95.2 - 84.1 %   MCV 82.4  78.0 - 100.0 fL   MCH 27.0  26.0 - 34.0  pg   MCHC 32.7  30.0 - 36.0 g/dL   RDW 32.4  40.1 - 02.7 %   Platelets 176  150 - 400 K/uL  DIFFERENTIAL     Status: None   Collection Time    11/26/12 12:12 AM      Result Value Range   Neutrophils Relative % 66  43 - 77 %   Neutro Abs 4.9  1.7 - 7.7 K/uL   Lymphocytes Relative 23  12 - 46 %   Lymphs Abs 1.7  0.7 - 4.0 K/uL   Monocytes Relative 8  3 - 12 %   Monocytes Absolute 0.6  0.1 - 1.0 K/uL   Eosinophils Relative 3  0 - 5 %   Eosinophils Absolute 0.2  0.0 - 0.7 K/uL   Basophils Relative 0  0 - 1 %   Basophils Absolute 0.0  0.0 - 0.1 K/uL  COMPREHENSIVE METABOLIC PANEL     Status: Abnormal   Collection Time    11/26/12 12:12 AM      Result Value Range   Sodium 138  135 - 145 mEq/L   Potassium 4.4  3.5 - 5.1 mEq/L   Chloride 101  96 - 112 mEq/L   CO2 26  19 - 32 mEq/L   Glucose, Bld 178 (*) 70 - 99 mg/dL   BUN 13  6 - 23 mg/dL   Creatinine,  Ser 0.72  0.50 - 1.10 mg/dL   Calcium 8.7  8.4 - 56.2 mg/dL   Total Protein 6.2  6.0 - 8.3 g/dL   Albumin 3.1 (*) 3.5 - 5.2 g/dL   AST 35  0 - 37 U/L   ALT 18  0 - 35 U/L   Alkaline Phosphatase 48  39 - 117 U/L   Total Bilirubin 0.4  0.3 - 1.2 mg/dL   GFR calc non Af Amer 80 (*) >90 mL/min   GFR calc Af Amer >90  >90 mL/min  TROPONIN I     Status: Abnormal   Collection Time    11/26/12 12:12 AM      Result Value Range   Troponin I 0.63 (*) <0.30 ng/mL  SURGICAL PCR SCREEN     Status: None   Collection Time    11/26/12  2:11 AM      Result Value Range   MRSA, PCR NEGATIVE  NEGATIVE   Staphylococcus aureus NEGATIVE  NEGATIVE  CBC     Status: None   Collection Time    11/26/12  5:25 AM      Result Value Range   WBC 8.0  4.0 - 10.5 K/uL   RBC 4.73  3.87 - 5.11 MIL/uL   Hemoglobin 13.0  12.0 - 15.0 g/dL   HCT 13.0  86.5 - 78.4 %   MCV 82.0  78.0 - 100.0 fL   MCH 27.5  26.0 - 34.0 pg   MCHC 33.5  30.0 - 36.0 g/dL   RDW 69.6  29.5 - 28.4 %   Platelets 160  150 - 400 K/uL  BASIC METABOLIC PANEL     Status: Abnormal    Collection Time    11/26/12  5:25 AM      Result Value Range   Sodium 134 (*) 135 - 145 mEq/L   Potassium 4.2  3.5 - 5.1 mEq/L   Chloride 99  96 - 112 mEq/L   CO2 26  19 - 32 mEq/L   Glucose, Bld 179 (*) 70 - 99 mg/dL   BUN 13  6 - 23 mg/dL   Creatinine, Ser 1.32  0.50 - 1.10 mg/dL   Calcium 8.4  8.4 - 44.0 mg/dL   GFR calc non Af Amer 79 (*) >90 mL/min   GFR calc Af Amer >90  >90 mL/min  LIPID PANEL     Status: Abnormal   Collection Time    11/26/12  5:25 AM      Result Value Range   Cholesterol 161  0 - 200 mg/dL   Triglycerides 102 (*) <150 mg/dL   HDL 23 (*) >72 mg/dL   Total CHOL/HDL Ratio 7.0     VLDL 67 (*) 0 - 40 mg/dL   LDL Cholesterol 71  0 - 99 mg/dL    Intake/Output Summary (Last 24 hours) at 11/26/12 0701 Last data filed at 11/26/12 0600  Gross per 24 hour  Intake   2087 ml  Output   1750 ml  Net    337 ml     ASSESSMENT AND PLAN:  CAD: CABG today  CONFUSION:  No acute findings on CT and exam nonfocal.  Transient confusion cleared  HTN:  BP OK we will follow this and dyslipidemia and diabetes after surgery. I will follow her in [place].      Fayrene Fearing Deatra Mcmahen 11/26/2012 7:01 AM

## 2012-11-27 ENCOUNTER — Encounter (HOSPITAL_COMMUNITY): Payer: Self-pay | Admitting: Cardiothoracic Surgery

## 2012-11-27 ENCOUNTER — Inpatient Hospital Stay (HOSPITAL_COMMUNITY): Payer: Medicare Other

## 2012-11-27 DIAGNOSIS — I6350 Cerebral infarction due to unspecified occlusion or stenosis of unspecified cerebral artery: Secondary | ICD-10-CM

## 2012-11-27 LAB — GLUCOSE, CAPILLARY
Glucose-Capillary: 150 mg/dL — ABNORMAL HIGH (ref 70–99)
Glucose-Capillary: 164 mg/dL — ABNORMAL HIGH (ref 70–99)
Glucose-Capillary: 167 mg/dL — ABNORMAL HIGH (ref 70–99)
Glucose-Capillary: 157 mg/dL — ABNORMAL HIGH (ref 70–99)
Glucose-Capillary: 144 mg/dL — ABNORMAL HIGH (ref 70–99)
Glucose-Capillary: 119 mg/dL — ABNORMAL HIGH (ref 70–99)
Glucose-Capillary: 121 mg/dL — ABNORMAL HIGH (ref 70–99)
Glucose-Capillary: 149 mg/dL — ABNORMAL HIGH (ref 70–99)
Glucose-Capillary: 166 mg/dL — ABNORMAL HIGH (ref 70–99)

## 2012-11-27 LAB — BASIC METABOLIC PANEL
BUN: 10 mg/dL (ref 6–23)
CO2: 22 mEq/L (ref 19–32)
Calcium: 7.6 mg/dL — ABNORMAL LOW (ref 8.4–10.5)
GFR calc non Af Amer: 64 mL/min — ABNORMAL LOW (ref >90)
Glucose, Bld: 132 mg/dL — ABNORMAL HIGH (ref 70–99)

## 2012-11-27 LAB — CBC
HCT: 31.5 % — ABNORMAL LOW (ref 36.0–46.0)
HCT: 28.1 % — ABNORMAL LOW (ref 36.0–46.0)
Hemoglobin: 10.4 g/dL — ABNORMAL LOW (ref 12.0–15.0)
Hemoglobin: 9.4 g/dL — ABNORMAL LOW (ref 12.0–15.0)
MCH: 26.7 pg (ref 26.0–34.0)
MCHC: 33 g/dL (ref 30.0–36.0)
MCV: 80.3 fL (ref 78.0–100.0)
RBC: 3.9 MIL/uL (ref 3.87–5.11)
RDW: 15.2 % (ref 11.5–15.5)
WBC: 16.3 10*3/uL — ABNORMAL HIGH (ref 4.0–10.5)

## 2012-11-27 LAB — POCT I-STAT, CHEM 8
Creatinine, Ser: 0.9 mg/dL (ref 0.50–1.10)
Hemoglobin: 9.2 g/dL — ABNORMAL LOW (ref 12.0–15.0)
Sodium: 136 mEq/L (ref 135–145)
TCO2: 23 mmol/L (ref 0–100)

## 2012-11-27 LAB — POCT I-STAT 3, ART BLOOD GAS (G3+)
Bicarbonate: 21.8 mEq/L (ref 20.0–24.0)
Bicarbonate: 22.3 mEq/L (ref 20.0–24.0)
TCO2: 23 mmol/L (ref 0–100)
pCO2 arterial: 40.8 mmHg (ref 35.0–45.0)
pCO2 arterial: 40.6 mmHg (ref 35.0–45.0)
pH, Arterial: 7.336 — ABNORMAL LOW (ref 7.350–7.450)
pH, Arterial: 7.348 — ABNORMAL LOW (ref 7.350–7.450)
pO2, Arterial: 73 mmHg — ABNORMAL LOW (ref 80.0–100.0)
pO2, Arterial: 70 mmHg — ABNORMAL LOW (ref 80.0–100.0)

## 2012-11-27 LAB — CREATININE, SERUM: GFR calc Af Amer: 79 mL/min — ABNORMAL LOW (ref >90)

## 2012-11-27 MED ORDER — MILRINONE IN DEXTROSE 20 MG/100ML IV SOLN: 0.0000 ug/kg/min | INTRAVENOUS | Status: DC

## 2012-11-27 MED ORDER — FUROSEMIDE 10 MG/ML IJ SOLN
40.0000 mg | Freq: Once | INTRAMUSCULAR | Status: AC
  Administered 2012-11-27: 40 mg via INTRAVENOUS

## 2012-11-27 MED ORDER — POTASSIUM CHLORIDE 10 MEQ/50ML IV SOLN
10.0000 meq | INTRAVENOUS | Status: AC | PRN
  Administered 2012-11-27 (×3): 10 meq via INTRAVENOUS

## 2012-11-27 MED ORDER — NEOMYCIN-POLYMYXIN-DEXAMETH 3.5-10000-0.1 OP SUSP: 2.0000 [drp] | Freq: Four times a day (QID) | OPHTHALMIC | Status: DC

## 2012-11-27 MED ORDER — INSULIN ASPART 100 UNIT/ML ~~LOC~~ SOLN
0.0000 [IU] | SUBCUTANEOUS | Status: DC
  Administered 2012-11-27: 2 [IU] via SUBCUTANEOUS
  Administered 2012-11-27: 4 [IU] via SUBCUTANEOUS
  Administered 2012-11-27 – 2012-11-28 (×5): 2 [IU] via SUBCUTANEOUS
  Administered 2012-11-28: 01:00:00 via SUBCUTANEOUS
  Administered 2012-11-28 – 2012-11-29 (×7): 2 [IU] via SUBCUTANEOUS
  Administered 2012-11-29: 4 [IU] via SUBCUTANEOUS
  Administered 2012-11-30 (×2): 2 [IU] via SUBCUTANEOUS

## 2012-11-27 MED ORDER — INSULIN DETEMIR 100 UNIT/ML ~~LOC~~ SOLN
20.0000 [IU] | Freq: Two times a day (BID) | SUBCUTANEOUS | Status: DC
  Administered 2012-11-27 – 2012-11-30 (×7): 20 [IU] via SUBCUTANEOUS
  Filled 2012-11-27 (×8): qty 0.2

## 2012-11-27 MED ORDER — FUROSEMIDE 10 MG/ML IJ SOLN
40.0000 mg | Freq: Once | INTRAMUSCULAR | Status: AC
  Administered 2012-11-27: 40 mg via INTRAVENOUS
  Filled 2012-11-27: qty 4

## 2012-11-27 MED FILL — Sodium Chloride Irrigation Soln 0.9%: Qty: 3000 | Status: AC

## 2012-11-27 MED FILL — Mannitol IV Soln 20%: INTRAVENOUS | Qty: 500 | Status: AC

## 2012-11-27 MED FILL — Electrolyte-R (PH 7.4) Solution: INTRAVENOUS | Qty: 5000 | Status: AC

## 2012-11-27 MED FILL — Lidocaine HCl IV Inj 20 MG/ML: INTRAVENOUS | Qty: 5 | Status: AC

## 2012-11-27 MED FILL — Sodium Bicarbonate IV Soln 8.4%: INTRAVENOUS | Qty: 50 | Status: AC

## 2012-11-27 MED FILL — Heparin Sodium (Porcine) Inj 1000 Unit/ML: INTRAMUSCULAR | Qty: 20 | Status: AC

## 2012-11-27 NOTE — Progress Notes (Signed)
Patient ID: Leah Levine, female   DOB: 1932-07-14, 79 y.o.   MRN: [id number] [facility] DAILY ICU PROGRESS NOTE                   [address]            [place] [address]          [phone]   1 Day Post-Op Procedure(s) (LRB): CORONARY ARTERY BYPASS GRAFTING (CABG) (N/A) INTRAOPERATIVE TRANSESOPHAGEAL ECHOCARDIOGRAM (N/A)  Total Length of Stay:  LOS: 2 days   Subjective: Still on vent, sleepy but follows commands  Objective: Vital signs in last 24 hours: Temp:  [97 F (36.1 C)-100 F (37.8 C)] 99 F (37.2 C) (09/04 0700) Pulse Rate:  [76-103] 102 (09/04 0700) Cardiac Rhythm:  [-] Sinus tachycardia (09/04 0400) Resp:  [12-25] 20 (09/04 0700) BP: (100-132)/(39-53) 126/46 mmHg (09/04 0700) SpO2:  [94 %-100 %] 99 % (09/04 0700) Arterial Line BP: (89-150)/(47-67) 124/54 mmHg (09/04 0700) FiO2 (%):  [40 %-50 %] 40 % (09/04 0627) Weight:  [203 lb 0.7 oz (92.1 kg)] 203 lb 0.7 oz (92.1 kg) (09/04 0627)  Filed Weights   11/25/12 0055 11/26/12 0451 11/27/12 0627  Weight: 180 lb (81.647 kg) 184 lb 15.5 oz (83.9 kg) 203 lb 0.7 oz (92.1 kg)    Weight change: 18 lb 1.2 oz (8.2 kg)   Hemodynamic parameters for last 24 hours: PAP: (29-42)/(13-28) 34/18 mmHg CO:  [3.1 L/min-5.8 L/min] 5.8 L/min CI:  [1.7 L/min/m2-3.2 L/min/m2] 3.2 L/min/m2  Intake/Output from previous day: 09/03 0701 - 09/04 0700 In: 5583.6 [I.V.:3805.6; Blood:458; NG/GT:120; IV Piggyback:1200] Out: 2130 [QMVHQ:4696; Emesis/NG output:100; Chest Tube:170]  Intake/Output this shift: Total I/O In: 50 [IV Piggyback:50] Out: -   Current Meds: Scheduled Meds: . acetaminophen  1,000 mg Oral Q6H   Or  . acetaminophen (TYLENOL) oral liquid 160 mg/5 mL  1,000 mg Per Tube Q6H  . aspirin EC  325 mg Oral Daily   Or  . aspirin  324 mg Per Tube Daily  . atorvastatin  80 mg Oral q1800  . bisacodyl  10 mg Oral Daily   Or  . bisacodyl  10 mg Rectal Daily  . cefUROXime (ZINACEF)  IV  1.5 g Intravenous  Q12H  . docusate sodium  200 mg Oral Daily  . ezetimibe  10 mg Oral Daily  . insulin regular  0-10 Units Intravenous TID WC  . Latitude-TIMI Study- Losmapimod/Placebo (PI- Stuckey )  7.5 mg Oral BID  . levothyroxine  100 mcg Oral QAC breakfast  . metoCLOPramide (REGLAN) injection  10 mg Intravenous Q6H  . metoprolol tartrate  12.5 mg Oral BID   Or  . metoprolol tartrate  12.5 mg Per Tube BID  . [START ON 11/28/2012] pantoprazole  40 mg Oral Daily  . sodium chloride  3 mL Intravenous Q12H   Continuous Infusions: . sodium chloride 20 mL/hr at 11/26/12 1500  . sodium chloride 20 mL/hr at 11/26/12 1500  . sodium chloride    . dexmedetomidine Stopped (11/26/12 1615)  . DOPamine 3 mcg/kg/min (11/27/12 0700)  . insulin (NOVOLIN-R) infusion 13.3 Units/hr (11/27/12 0600)  . lactated ringers 20 mL/hr at 11/26/12 1500  . milrinone 0.3 mcg/kg/min (11/27/12 0700)  . nitroGLYCERIN 35 mcg/min (11/27/12 0700)  . phenylephrine (NEO-SYNEPHRINE) Adult infusion Stopped (11/26/12 1815)   PRN Meds:.albumin human, metoprolol, midazolam, morphine injection, ondansetron (ZOFRAN) IV, potassium chloride, sodium chloride, traMADol  General appearance: alert and cooperative Neurologic: intact Heart: regular rate and  rhythm, S1, S2 normal, no murmur, click, rub or gallop Lungs: diminished breath sounds bibasilar Abdomen: soft, non-tender; bowel sounds normal; no masses,  no organomegaly Extremities: extremities normal, atraumatic, no cyanosis or edema and Homans sign is negative, no sign of DVT Wound: dressing intact  Lab Results: CBC: Recent Labs  11/26/12 2100 11/26/12 2124 11/27/12 0400  WBC 14.3*  --  13.9*  HGB 10.2* 10.2* 10.4*  HCT 30.3* 30.0* 31.5*  PLT 134*  --  153   BMET:  Recent Labs  11/26/12 0525  11/26/12 2124 11/27/12 0400  NA 134*  < > 138 141  K 4.2  < > 3.9 3.7  CL 99  --  103 108  CO2 26  --   --  22  GLUCOSE 179*  < > 182* 132*  BUN 13  --  9 10  CREATININE 0.74  < >  0.90 0.85  CALCIUM 8.4  --   --  7.6*  < > = values in this interval not displayed.  PT/INR:  Recent Labs  11/26/12 1500  LABPROT 16.8*  INR 1.40   Radiology: Ct Head Wo Contrast  11/25/2012   *RADIOLOGY REPORT*  Clinical Data: Mental status changes after cardiac cath today.  CT HEAD WITHOUT CONTRAST  Technique:  Contiguous axial images were obtained from the base of the skull through the vertex without contrast.  Comparison: Head CT 10/10/2003  Findings: Negative for acute hemorrhage.  The ventricles are normal in size and stable.  There is no midline shift or evidence of acute cortically based infarction.  The skull is intact and the visualized paranasal sinuses and mastoid air cells are clear.  IMPRESSION: No acute intracranial abnormality.   Original Report Authenticated By: Britta Mccreedy, M.D.   Dg Chest Portable 1 View In Am  11/27/2012   CLINICAL DATA:  Postop from CABG.  EXAM: PORTABLE CHEST - 1 VIEW  COMPARISON:  11/25/2012  FINDINGS: Support lines and tubes in appropriate position. No pneumothorax identified. Mild increase in atelectasis noted in the left retrocardiac lung base. Right lung is clear. Heart size stable.  IMPRESSION: Mild increase in left basilar atelectasis. No pneumothorax visualized.   Electronically Signed   By: Myles Rosenthal   On: 11/27/2012 07:40   Dg Chest Portable 1 View  11/26/2012   *RADIOLOGY REPORT*  Clinical Data: Status post CABG  PORTABLE CHEST - 1 VIEW  Comparison: 11/25/2012  Findings: The endotracheal tube tip is above the carina.  There is a nasogastric tube with tip in the stomach.  Swan-Ganz catheter tip is in the right pulmonary artery.  There is a left-sided chest tube in place.  No pneumothorax is identified.  Atelectasis is noted within the left midlung and left base.  IMPRESSION:  1.  No complications status post CABG procedure. 2.  Mediastinal drain and left chest tube in place without significant pneumothorax.   Original Report Authenticated By: Signa Kell, M.D.     Assessment/Plan: S/P Procedure(s) (LRB): CORONARY ARTERY BYPASS GRAFTING (CABG) (N/A) INTRAOPERATIVE TRANSESOPHAGEAL ECHOCARDIOGRAM (N/A) Mobilize Diuresis Continue foley due to strict I&O, patient in ICU and urinary output monitoring See progression orders Expected Acute  Blood - loss Anemia     Bridgitte Felicetti B 11/27/2012 8:10 AM

## 2012-11-27 NOTE — Progress Notes (Signed)
Pt awake and following most commands.  Able to hold head up off pillow and stick tongue out, moving all extremities, unable to grip hand possible due to edema.  Will attempt to being wean and assess.

## 2012-11-27 NOTE — Procedures (Signed)
Extubation Procedure Note  Patient Details:   Name: Leah Levine DOB: 12-03-1932 MRN: [id number]   Airway Documentation:     Evaluation  O2 sats: stable throughout Complications: No apparent complications Patient did tolerate procedure well. Bilateral Breath Sounds: Clear;Diminished   Yes  Pt was on CPAP/PS pending extubation and pt self extubated. Pt placed on 6lpm [place], no dyspnea or stridor noted. Pt achieved x 4 with IS. Pt resting comfortably and vital signs are within normal limits, no re-intubation needed at this time. RT will continue to monitor.   Beatris Si 11/27/2012, 8:39 AM

## 2012-11-27 NOTE — Anesthesia Postprocedure Evaluation (Signed)
  Anesthesia Post-op Note  Patient: Leah Levine  Procedure(s) Performed: Procedure(s): CORONARY ARTERY BYPASS GRAFTING (CABG) (N/A) INTRAOPERATIVE TRANSESOPHAGEAL ECHOCARDIOGRAM (N/A)  Patient Location: PACU and SICU  Anesthesia Type:General  Level of Consciousness: sedated  Airway and Oxygen Therapy: Patient remains intubated per anesthesia plan  Post-op Pain: mild  Post-op Assessment: Post-op Vital signs reviewed  Post-op Vital Signs: Reviewed  Complications: No apparent anesthesia complications

## 2012-11-27 NOTE — Op Note (Signed)
NAME:  KATESSA, ATTRIDGE NO.:  [id number]  MEDICAL RECORD NO.:  [id number]  LOCATION:  2S01C                        FACILITY:  [facility]  PHYSICIAN:  Sheliah Plane, MD    DATE OF BIRTH:  03-30-1932  DATE OF PROCEDURE:  11/26/2012 DATE OF DISCHARGE:                              OPERATIVE REPORT   PREOPERATIVE DIAGNOSIS:  Coronary occlusive disease with recent non-ST elevation myocardial infarction.  POSTOPERATIVE DIAGNOSIS:  Coronary occlusive disease with recent non-ST elevation myocardial infarction.  SURGICAL PROCEDURE:  Coronary artery bypass grafting x4 with the left internal mammary to the left anterior descending coronary artery, reverse saphenous vein graft to the diagonal coronary artery, reverse saphenous vein graft to the distal circumflex coronary artery, reverse saphenous vein graft to the distal right coronary artery with right thigh and calf endo vein harvesting.  SURGEON:  Sheliah Plane, MD  FIRST ASSISTANT:  Doree Fudge, PA  BRIEF HISTORY:  The patient is a 79 year old female who presented after intermittent chest pain starting 10 days previously and episode of prolonged chest pain after riding in a bus to [place] to attend baseball games and troponins were elevated.  She stabilized medically on admission and underwent cardiac catheterization by Dr. Shirlee Latch because of high-grade LAD lesion, a total circ and total right coronary artery bypass grafting was recommended to the patient.  The night prior to surgery she had period of confusion and some difficulty with speech. However, this along with episodes of chest pain and neurologic symptoms resolved quickly, and CT scan of the head showed no evidence of intracranial bleed or stroke.  The risks of surgery were discussed with the patient and her family in detail, and she was agreeable with proceeding.  DESCRIPTION OF PROCEDURE:  With Swan-Ganz and arterial line monitors in place, the  patient underwent general endotracheal anesthesia without incident.  Skin of the chest and legs prepped with Betadine and draped in usual sterile manner.  A TEE probe was placed and the findings are dictated under separate note by Anesthesia.  After appropriate time-out, small incision was made in the left leg at the knee.  This showed no suitable vein was identified.  We then went to the right leg and right thigh and calf endo vein harvesting was carried out.  With harvesting of a suitable vein.  Median sternotomy was performed.  Left internal mammary artery was dissected down as a pedicle graft.  The distal artery was divided had good free flow.  The pericardium was opened.  Overall ventricular function appeared preserved.  The patient was systemically heparinized.  The ascending aorta was cannulated.  The right atrium was cannulated.  An aortic root vent cardioplegia needle was introduced into the ascending aorta.  The patient was placed on cardiopulmonary bypass at 2.4 liters/minute/meter squared.  Sites of anastomosis were selected and dissected out of the epicardium.  The patient's body temperature was cool to 32 degrees.  Aortic crossclamp was applied, 500 mL of cold blood potassium cardioplegia was administered with diastolic arrest of the heart.  Myocardial septal temperature was monitored throughout the crossclamp period.  Attention was turned first to the distal right coronary artery which was opened and was 1.8 mm  size vessel.  Using a running 7-0 Prolene, distal anastomosis was performed.  The heart was then elevated.  Attention was turned to the distal circumflex which was slightly smaller, but a good quality vessel and admitted a 1.5 mm probe using a running 7-0 Prolene, distal anastomosis was performed. Additional cold blood cardioplegia was intermittently administered down the vein grafts.  Attention was then turned to the diagonal coronary artery which was opened and  admitted a 1.5 mm probe.  Using a running 7- 0 Prolene, distal anastomosis was performed.  The left internal mammary artery was unused was taken, and the LAD was opened at the distal third of the vessel.  The LAD was a small vessel.  Using a running 8-0 Prolene, left internal mammary artery was anastomosed to the left anterior descending coronary artery.  With release of the bulldog there was rise in myocardial septal temperature.  With crossclamp still in place 3 punch aortotomies were performed.  Each of 3 vein grafts were anastomosed to the ascending aorta.  Air was evacuated from the grafts aortic crossclamp was removed.  Total crossclamp time  was 96 minutes. Sites anastomosed, the bulldog was removed from the mammary artery. Sites of anastomosis were inspected and free of bleeding.  The patient was then ventilated and weaned from cardiopulmonary bypass without difficulty.  She remained hemodynamically stable was decannulated in usual fashion.  Protamine sulfate was administered with operative field hemostatic, atrial and ventricular pacing wires had been applied.  Graft marker was applied left pleural tube and Blake mediastinal drain were left in place.  Sternum was closed with #6 stainless steel wire.  Fascia closed with interrupted 0 Vicryl, running 3-0 Vicryl, subcutaneous tissue with 4-0 subcuticular stitch, and skin edges sterile dressings were applied.  Sponge and needle count was reported as correct at the completion of procedure.  The patient tolerated the procedure without obvious complication and was transferred to Surgical Intensive Care Unit for further postop care.     Sheliah Plane, MD     EG/MEDQ  D:  11/27/2012  T:  11/27/2012  Job:  [id number]

## 2012-11-27 NOTE — Progress Notes (Signed)
Pt more arousable to voice but will not follow commands.  Falls back asleep quickly.  Will continue to monitor.

## 2012-11-27 NOTE — Progress Notes (Signed)
No change in awake/neuro status from previous hour.  Not following commands but able to move all extremities well.

## 2012-11-27 NOTE — Progress Notes (Signed)
Pt tolerating weaning on 40/4.  Will notify RT to change to CPAP/PS.  Concerned for possible lack of cuff leak and significant edema, will eval with MD prior to extubation.

## 2012-11-27 NOTE — Progress Notes (Signed)
Pt now moving all extremities on her own but will not follow all commands.  Able to open eyes to loud voice but falls back asleep quickly.  Not ready to wean at this time.  Will continue to monitor.

## 2012-11-27 NOTE — Progress Notes (Signed)
PM ROUNDS  POD # 1 CABG  Resting  BP 118/55  Pulse 87  Temp(Src) 97.7 F (36.5 C) (Oral)  Resp 15  Ht 4' 11.84" (1.52 m)  Wt 203 lb 0.7 oz (92.1 kg)  BMI 39.86 kg/m2  SpO2 98%   Intake/Output Summary (Last 24 hours) at 11/27/12 1658 Last data filed at 11/27/12 1600  Gross per 24 hour  Intake 2525.49 ml  Output   2640 ml  Net -114.51 ml   K 4.4, cr 0.90  Hct 27

## 2012-11-27 NOTE — Progress Notes (Signed)
Stroke Team Progress Note  HISTORY  Leah Levine is an 79 y.o. female with a past medical history significant for hyperlipidemia, DM type 2, HTN (patient denies ever having hypertension), hypothyroidism, GERD, who initially presented to [facility] with chest pain and was found to have abnormal EKG with elevated troponin. Transferred to [facility] for further management and had an unsuccessful cardiac cath this afternoon that revealed severe 3 vessel disease with preserved EF and has been scheduled for CABG in the morning.  Patient was last known well by family around 4:30 pm and approximately at 9:30 was noted to be confused, weak in the right arm greater than the right leg, and with slurred speech. She has been off IV heparin since her cath this afternoon.  According to nursing staff, her speech and right sided weakness started improving within the last 30 minutes.  At the moment of initial neuro consult she has NIHSS 3. CT brain showed no acute abnormality   Patient was not a TPA candidate secondary to the fact that she had just had a cardiac cathertization. She was admitted for further evaluation and treatment.  Patient re-examined just prior to surgery and felt to be neurologically stable, had no further trouble with speech. She underwent CABG X 4 on 11/26/2012  SUBJECTIVE Has just extubated herself using right arm mostly as left arm is immoblized due to IV lines, etc. Groggy but easily awakens to voice and follows basic commands, speech normal.  OBJECTIVE Most recent Vital Signs: Filed Vitals:   11/27/12 0915 11/27/12 0930 11/27/12 0945 11/27/12 1000  BP:    113/48  Pulse: 100 100 102 102  Temp:      TempSrc:      Resp: 16 20 14 18   Height:      Weight:      SpO2: 97% 98% 96% 97%   CBG (last 3)   Recent Labs  11/26/12 2305 11/26/12 2354 11/27/12 0402  GLUCAP 144* 130* 131*    IV Fluid Intake:   . sodium chloride 20 mL/hr at 11/26/12 1500  . sodium chloride 20 mL/hr at 11/26/12  1500  . sodium chloride    . dexmedetomidine Stopped (11/26/12 1615)  . DOPamine 3 mcg/kg/min (11/27/12 0700)  . insulin (NOVOLIN-R) infusion 13.3 Units/hr (11/27/12 0600)  . lactated ringers 20 mL/hr at 11/26/12 1500  . milrinone 0.3 mcg/kg/min (11/27/12 1010)  . nitroGLYCERIN 35 mcg/min (11/27/12 0700)  . phenylephrine (NEO-SYNEPHRINE) Adult infusion Stopped (11/26/12 1815)    MEDICATIONS  . acetaminophen  1,000 mg Oral Q6H   Or  . acetaminophen (TYLENOL) oral liquid 160 mg/5 mL  1,000 mg Per Tube Q6H  . aspirin EC  325 mg Oral Daily   Or  . aspirin  324 mg Per Tube Daily  . atorvastatin  80 mg Oral q1800  . bisacodyl  10 mg Oral Daily   Or  . bisacodyl  10 mg Rectal Daily  . cefUROXime (ZINACEF)  IV  1.5 g Intravenous Q12H  . docusate sodium  200 mg Oral Daily  . ezetimibe  10 mg Oral Daily  . insulin aspart  0-24 Units Subcutaneous Q4H  . insulin detemir  20 Units Subcutaneous BID  . insulin regular  0-10 Units Intravenous TID WC  . Latitude-TIMI Study- Losmapimod/Placebo (PI- Stuckey )  7.5 mg Oral BID  . levothyroxine  100 mcg Oral QAC breakfast  . metoCLOPramide (REGLAN) injection  10 mg Intravenous Q6H  . metoprolol tartrate  12.5 mg Oral BID  Or  . metoprolol tartrate  12.5 mg Per Tube BID  . [START ON 11/28/2012] pantoprazole  40 mg Oral Daily  . sodium chloride  3 mL Intravenous Q12H   PRN:  albumin human, metoprolol, midazolam, ondansetron (ZOFRAN) IV, sodium chloride, traMADol  Diet:  Clear Liquid NPO  Activity:  Bedrest DVT Prophylaxis:  SCD  CLINICALLY SIGNIFICANT STUDIES Basic Metabolic Panel:  Recent Labs Lab 11/26/12 0525  11/26/12 2100 11/26/12 2124 11/27/12 0400  NA 134*  < >  --  138 141  K 4.2  < >  --  3.9 3.7  CL 99  --   --  103 108  CO2 26  --   --   --  22  GLUCOSE 179*  < >  --  182* 132*  BUN 13  --   --  9 10  CREATININE 0.74  --  0.75 0.90 0.85  CALCIUM 8.4  --   --   --  7.6*  MG  --   --  3.2*  --  2.5  < > = values in this  interval not displayed. Liver Function Tests:   Recent Labs Lab 11/25/12 0135 11/26/12 0012  AST 37 35  ALT 21 18  ALKPHOS 64 48  BILITOT 0.4 0.4  PROT 7.4 6.2  ALBUMIN 3.7 3.1*   CBC:  Recent Labs Lab 11/25/12 0135 11/26/12 0012  11/26/12 2100 11/26/12 2124 11/27/12 0400  WBC 8.1 7.4  < > 14.3*  --  13.9*  NEUTROABS 4.7 4.9  --   --   --   --   HGB 14.1 12.9  < > 10.2* 10.2* 10.4*  HCT 43.0 39.4  < > 30.3* 30.0* 31.5*  MCV 81.6 82.4  < > 80.4  --  80.8  PLT 227 176  < > 134*  --  153  < > = values in this interval not displayed. Coagulation:   Recent Labs Lab 11/25/12 0224 11/25/12 1620 11/26/12 0012 11/26/12 1500  LABPROT 12.6 12.7 13.0 16.8*  INR 0.96 0.97 1.00 1.40   Cardiac Enzymes:   Recent Labs Lab 11/25/12 1620 11/25/12 1923 11/26/12 0012  TROPONINI 0.69* 0.70* 0.63*   Urinalysis:   Recent Labs Lab 11/25/12 0209  COLORURINE YELLOW  LABSPEC 1.024  PHURINE 5.0  GLUCOSEU 250*  HGBUR NEGATIVE  BILIRUBINUR NEGATIVE  KETONESUR NEGATIVE  PROTEINUR NEGATIVE  UROBILINOGEN 0.2  NITRITE NEGATIVE  LEUKOCYTESUR MODERATE*   Lipid Panel    Component Value Date/Time   CHOL 161 11/26/2012 0525   TRIG 333* 11/26/2012 0525   TRIG 527* 09/03/2012 1456   HDL 23* 11/26/2012 0525   CHOLHDL 7.0 11/26/2012 0525   VLDL 67* 11/26/2012 0525   LDLCALC 71 11/26/2012 0525   LDLCALC 90 09/03/2012 1456   HgbA1C  Lab Results  Component Value Date   HGBA1C 6.3* 07/03/2010    Urine Drug Screen:   No results found for this basename: labopia,  cocainscrnur,  labbenz,  amphetmu,  thcu,  labbarb    Alcohol Level: No results found for this basename: ETH,  in the last 168 hours  Ct Head Wo Contrast 11/25/2012   No acute intracranial abnormality.     Dg Chest Portable 1 View In Am 11/27/2012    Mild increase in left basilar atelectasis. No pneumothorax visualized 11/26/2012    1.  No complications status post CABG procedure. 2.  Mediastinal drain and left chest tube in place  without significant pneumothorax.  MRI of the brain    MRA of the brain    2D Echocardiogram    Carotid Doppler    EKG  normal sinus rhythm.   Therapy Recommendations   Physical Exam        ASSESSMENT Leah Levine is a 79 y.o. female presenting with post cath with acute delirium, mild right hemiparesis and dysarthria which resolved prior to surgery per noted in chart. Imaging confirms no acute infarct. Infarct felt to be probable left brain stroke not seen on CT and source from small vessel disease.  On aspirin 81 mg orally every day prior to admission. Now on aspirin 325 mg orally every day for secondary stroke prevention. Patient with cleared deficits.   Coronary artery disease, s/p CABG  Hyperlipidemia, on statin, LDL 90, goal < 70 if diabetic  diabetes mellitus type 2, hgb a1c 6.3  hypothyroidism   Hospital day # 2  TREATMENT/PLAN  Continue aspirin 325 mg orally every day for secondary stroke prevention.  Risk factor management  Have patient follow up with Dr. Pearlean Brownie in stroke clinic in 2 months  Stroke service will sign off, but will be available if needed.  Gwendolyn Lima. Manson Passey, [facility], MBA, MHA [facility] Pager: [phone] 11/27/2012 11:08 AM  I have personally obtained a history, examined the patient, evaluated imaging results, and formulated the assessment and plan of care. I agree with the above.

## 2012-11-28 ENCOUNTER — Inpatient Hospital Stay (HOSPITAL_COMMUNITY): Payer: Medicare Other

## 2012-11-28 LAB — CBC
HCT: 27.2 % — ABNORMAL LOW (ref 36.0–46.0)
Hemoglobin: 9.1 g/dL — ABNORMAL LOW (ref 12.0–15.0)
MCH: 27.2 pg (ref 26.0–34.0)
MCHC: 33.5 g/dL (ref 30.0–36.0)
MCV: 81.4 fL (ref 78.0–100.0)
Platelets: 121 10*3/uL — ABNORMAL LOW (ref 150–400)
RBC: 3.34 MIL/uL — ABNORMAL LOW (ref 3.87–5.11)
RDW: 15.8 % — ABNORMAL HIGH (ref 11.5–15.5)
WBC: 13.2 10*3/uL — ABNORMAL HIGH (ref 4.0–10.5)

## 2012-11-28 LAB — GLUCOSE, CAPILLARY
Glucose-Capillary: 155 mg/dL — ABNORMAL HIGH (ref 70–99)
Glucose-Capillary: 126 mg/dL — ABNORMAL HIGH (ref 70–99)
Glucose-Capillary: 121 mg/dL — ABNORMAL HIGH (ref 70–99)
Glucose-Capillary: 171 mg/dL — ABNORMAL HIGH (ref 70–99)
Glucose-Capillary: 142 mg/dL — ABNORMAL HIGH (ref 70–99)
Glucose-Capillary: 109 mg/dL — ABNORMAL HIGH (ref 70–99)
Glucose-Capillary: 136 mg/dL — ABNORMAL HIGH (ref 70–99)

## 2012-11-28 LAB — BASIC METABOLIC PANEL
BUN: 12 mg/dL (ref 6–23)
CO2: 28 mEq/L (ref 19–32)
Calcium: 7.4 mg/dL — ABNORMAL LOW (ref 8.4–10.5)
Chloride: 101 mEq/L (ref 96–112)
Creatinine, Ser: 0.79 mg/dL (ref 0.50–1.10)
GFR calc Af Amer: >90 mL/min (ref >90)
GFR calc non Af Amer: 78 mL/min — ABNORMAL LOW (ref >90)
Glucose, Bld: 168 mg/dL — ABNORMAL HIGH (ref 70–99)
Potassium: 4.2 mEq/L (ref 3.5–5.1)
Sodium: 136 mEq/L (ref 135–145)

## 2012-11-28 LAB — URINE CULTURE: Colony Count: >=100000

## 2012-11-28 MED ORDER — FUROSEMIDE 10 MG/ML IJ SOLN
40.0000 mg | Freq: Once | INTRAMUSCULAR | Status: AC
  Administered 2012-11-28: 40 mg via INTRAVENOUS
  Filled 2012-11-28: qty 4

## 2012-11-28 MED ORDER — LEVOFLOXACIN 250 MG PO TABS
250.0000 mg | ORAL_TABLET | Freq: Every day | ORAL | Status: DC
  Administered 2012-11-28 – 2012-12-02 (×5): 250 mg via ORAL
  Filled 2012-11-28 (×6): qty 1

## 2012-11-28 MED ORDER — CEPHALEXIN 500 MG PO CAPS
500.0000 mg | ORAL_CAPSULE | Freq: Three times a day (TID) | ORAL | Status: DC
  Administered 2012-11-28 (×2): 500 mg via ORAL
  Filled 2012-11-28 (×4): qty 1

## 2012-11-28 MED FILL — Sodium Chloride IV Soln 0.9%: INTRAVENOUS | Qty: 1000 | Status: AC

## 2012-11-28 MED FILL — Magnesium Sulfate Inj 50%: INTRAMUSCULAR | Qty: 10 | Status: AC

## 2012-11-28 MED FILL — Dexmedetomidine HCl IV Soln 200 MCG/2ML: INTRAVENOUS | Qty: 2 | Status: AC

## 2012-11-28 MED FILL — Heparin Sodium (Porcine) Inj 1000 Unit/ML: INTRAMUSCULAR | Qty: 30 | Status: AC

## 2012-11-28 MED FILL — Potassium Chloride Inj 2 mEq/ML: INTRAVENOUS | Qty: 40 | Status: AC

## 2012-11-28 NOTE — Progress Notes (Signed)
Patient ID: Leah Levine, female   DOB: October 09, 1932, 79 y.o.   MRN: [id number] TCTS DAILY ICU PROGRESS NOTE                   [address]            [place] [address]          [phone]   2 Days Post-Op Procedure(s) (LRB): CORONARY ARTERY BYPASS GRAFTING (CABG) (N/A) INTRAOPERATIVE TRANSESOPHAGEAL ECHOCARDIOGRAM (N/A)  Total Length of Stay:  LOS: 3 days   Subjective: Awake and alert up in chair, very slow moving around as predicted preop  Objective: Vital signs in last 24 hours: Temp:  [97.3 F (36.3 C)-99 F (37.2 C)] 98.1 F (36.7 C) (09/05 0351) Pulse Rate:  [80-110] 88 (09/05 0700) Cardiac Rhythm:  [-] Normal sinus rhythm (09/05 0400) Resp:  [14-25] 14 (09/05 0700) BP: (103-138)/(39-66) 122/58 mmHg (09/05 0700) SpO2:  [90 %-100 %] 90 % (09/05 0700) Arterial Line BP: (95-190)/(42-72) 130/52 mmHg (09/04 2200) Weight:  [198 lb 10.2 oz (90.1 kg)] 198 lb 10.2 oz (90.1 kg) (09/05 0600)  Filed Weights   11/26/12 0451 11/27/12 0627 11/28/12 0600  Weight: 184 lb 15.5 oz (83.9 kg) 203 lb 0.7 oz (92.1 kg) 198 lb 10.2 oz (90.1 kg)    Weight change: -4 lb 6.6 oz (-2 kg)   Hemodynamic parameters for last 24 hours: PAP: (14-32)/(10-18) 14/10 mmHg  Intake/Output from previous day: 09/04 0701 - 09/05 0700 In: 1078.9 [I.V.:898.9; NG/GT:30; IV Piggyback:150] Out: 3445 [Urine:3265; Chest Tube:180]  Intake/Output this shift:    Current Meds: Scheduled Meds: . acetaminophen  1,000 mg Oral Q6H   Or  . acetaminophen (TYLENOL) oral liquid 160 mg/5 mL  1,000 mg Per Tube Q6H  . aspirin EC  325 mg Oral Daily   Or  . aspirin  324 mg Per Tube Daily  . atorvastatin  80 mg Oral q1800  . bisacodyl  10 mg Oral Daily   Or  . bisacodyl  10 mg Rectal Daily  . docusate sodium  200 mg Oral Daily  . ezetimibe  10 mg Oral Daily  . insulin aspart  0-24 Units Subcutaneous Q4H  . insulin detemir  20 Units Subcutaneous BID  . Latitude-TIMI Study- Losmapimod/Placebo  (PI- Stuckey )  7.5 mg Oral BID  . levothyroxine  100 mcg Oral QAC breakfast  . metoprolol tartrate  12.5 mg Oral BID   Or  . metoprolol tartrate  12.5 mg Per Tube BID  . pantoprazole  40 mg Oral Daily  . sodium chloride  3 mL Intravenous Q12H   Continuous Infusions: . sodium chloride Stopped (11/27/12 1800)  . sodium chloride 20 mL/hr at 11/26/12 1500  . sodium chloride    . dexmedetomidine Stopped (11/26/12 1615)  . DOPamine Stopped (11/27/12 2147)  . insulin (NOVOLIN-R) infusion Stopped (11/27/12 1400)  . lactated ringers 20 mL/hr at 11/26/12 1500  . milrinone Stopped (11/27/12 2000)  . nitroGLYCERIN Stopped (11/27/12 1400)  . phenylephrine (NEO-SYNEPHRINE) Adult infusion Stopped (11/26/12 1815)   PRN Meds:.metoprolol, midazolam, ondansetron (ZOFRAN) IV, sodium chloride, traMADol  General appearance: alert, cooperative, appears older than stated age and no distress Neurologic: intact Heart: regular rate and rhythm, S1, S2 normal, no murmur, click, rub or gallop Lungs: diminished breath sounds bibasilar Abdomen: soft, non-tender; bowel sounds normal; no masses,  no organomegaly Extremities: extremities normal, atraumatic, no cyanosis or edema and Homans sign is negative, no sign of DVT Wound: dressing intact,  sternum stable  Lab Results: CBC: Recent Labs  11/27/12 1600 11/27/12 1647 11/28/12 0355  WBC 16.3*  --  13.2*  HGB 9.4* 9.2* 9.1*  HCT 28.1* 27.0* 27.2*  PLT 140*  --  121*   BMET:  Recent Labs  11/27/12 0400  11/27/12 1647 11/28/12 0355  NA 141  --  136 136  K 3.7  --  4.4 4.2  CL 108  --  101 101  CO2 22  --   --  28  GLUCOSE 132*  --  181* 168*  BUN 10  --  10 12  CREATININE 0.85  < > 0.90 0.79  CALCIUM 7.6*  --   --  7.4*  < > = values in this interval not displayed.  PT/INR:  Recent Labs  11/26/12 1500  LABPROT 16.8*  INR 1.40   Radiology: Dg Chest Portable 1 View In Am  11/27/2012   CLINICAL DATA:  Postop from CABG.  EXAM: PORTABLE CHEST -  1 VIEW  COMPARISON:  11/25/2012  FINDINGS: Support lines and tubes in appropriate position. No pneumothorax identified. Mild increase in atelectasis noted in the left retrocardiac lung base. Right lung is clear. Heart size stable.  IMPRESSION: Mild increase in left basilar atelectasis. No pneumothorax visualized.   Electronically Signed   By: Myles Rosenthal   On: 11/27/2012 07:40   Dg Chest Portable 1 View  11/26/2012   *RADIOLOGY REPORT*  Clinical Data: Status post CABG  PORTABLE CHEST - 1 VIEW  Comparison: 11/25/2012  Findings: The endotracheal tube tip is above the carina.  There is a nasogastric tube with tip in the stomach.  Swan-Ganz catheter tip is in the right pulmonary artery.  There is a left-sided chest tube in place.  No pneumothorax is identified.  Atelectasis is noted within the left midlung and left base.  IMPRESSION:  1.  No complications status post CABG procedure. 2.  Mediastinal drain and left chest tube in place without significant pneumothorax.   Original Report Authenticated By: Signa Kell, M.D.     Assessment/Plan: S/P Procedure(s) (LRB): CORONARY ARTERY BYPASS GRAFTING (CABG) (N/A) INTRAOPERATIVE TRANSESOPHAGEAL ECHOCARDIOGRAM (N/A) Mobilize Diuresis Diabetes control d/c tubes/lines Continue ABX therapy Keflex due to for preop UTI  Need pt ot to work with   [name] 11/28/2012 7:41 AM

## 2012-11-28 NOTE — Progress Notes (Signed)
Patient ID: Leah Levine, female   DOB: 1932-06-23, 79 y.o.   MRN: [id number]  SICU Evening Rounds:  Hemodynamically stable Diuresing  Urine culture grew E. Coli and Enterococcus, both sens to levaquin so will switch to this and stop Keflex.

## 2012-11-28 NOTE — Progress Notes (Signed)
Pt states she is uncomfortable and attempts to reposition herself in bed.  Sternal precautions explained and reinforced.  Call light in reach and pt demonstrates that she can use appropriately.

## 2012-11-29 LAB — BASIC METABOLIC PANEL
BUN: 18 mg/dL (ref 6–23)
CO2: 29 mEq/L (ref 19–32)
Calcium: 7.6 mg/dL — ABNORMAL LOW (ref 8.4–10.5)
Chloride: 99 mEq/L (ref 96–112)
Creatinine, Ser: 0.78 mg/dL (ref 0.50–1.10)
GFR calc Af Amer: >90 mL/min (ref >90)
GFR calc non Af Amer: 78 mL/min — ABNORMAL LOW (ref >90)
Glucose, Bld: 162 mg/dL — ABNORMAL HIGH (ref 70–99)
Potassium: 4.3 mEq/L (ref 3.5–5.1)
Sodium: 134 mEq/L — ABNORMAL LOW (ref 135–145)

## 2012-11-29 LAB — TYPE AND SCREEN
ABO/RH(D): A POS
Antibody Screen: POSITIVE
DAT, IgG: NEGATIVE
Donor AG Type: NEGATIVE
Donor AG Type: NEGATIVE
Donor AG Type: NEGATIVE
Donor AG Type: NEGATIVE
Unit division: 0
Unit division: 0
Unit division: 0
Unit division: 0
Unit division: 0
Unit division: 0

## 2012-11-29 LAB — CBC
HCT: 27.6 % — ABNORMAL LOW (ref 36.0–46.0)
Hemoglobin: 8.9 g/dL — ABNORMAL LOW (ref 12.0–15.0)
MCH: 27 pg (ref 26.0–34.0)
MCHC: 32.2 g/dL (ref 30.0–36.0)
MCV: 83.6 fL (ref 78.0–100.0)
Platelets: 132 10*3/uL — ABNORMAL LOW (ref 150–400)
RBC: 3.3 MIL/uL — ABNORMAL LOW (ref 3.87–5.11)
RDW: 16.1 % — ABNORMAL HIGH (ref 11.5–15.5)
WBC: 10.8 10*3/uL — ABNORMAL HIGH (ref 4.0–10.5)

## 2012-11-29 LAB — GLUCOSE, CAPILLARY
Glucose-Capillary: 127 mg/dL — ABNORMAL HIGH (ref 70–99)
Glucose-Capillary: 150 mg/dL — ABNORMAL HIGH (ref 70–99)
Glucose-Capillary: 124 mg/dL — ABNORMAL HIGH (ref 70–99)

## 2012-11-29 MED ORDER — POTASSIUM CHLORIDE CRYS ER 20 MEQ PO TBCR
20.0000 meq | EXTENDED_RELEASE_TABLET | Freq: Two times a day (BID) | ORAL | Status: DC
  Administered 2012-11-29 – 2012-11-30 (×3): 20 meq via ORAL
  Filled 2012-11-29 (×2): qty 1

## 2012-11-29 MED ORDER — FUROSEMIDE 10 MG/ML IJ SOLN
40.0000 mg | Freq: Two times a day (BID) | INTRAMUSCULAR | Status: DC
  Administered 2012-11-29 – 2012-11-30 (×3): 40 mg via INTRAVENOUS
  Filled 2012-11-29 (×4): qty 4

## 2012-11-29 NOTE — Evaluation (Signed)
Physical Therapy Evaluation Patient Details Name: Leah Levine MRN: [id number] DOB: 1933-01-20 Today's Date: 11/29/2012 Time: 4098-1191 PT Time Calculation (min): 26 min  PT Assessment / Plan / Recommendation History of Present Illness  Pt is 79 y.o. female s/p CABG  Clinical Impression  Patient demonstrates deficits in functional mobility as indicated below. Pt will benefit from continued skilled PT to address deficits and maximize independence. Will continue to see as indicated. rec HHPT upon discharge with family supervision and PRN assist.     PT Assessment  Patient needs continued PT services    Follow Up Recommendations  Home health PT;Supervision/Assistance - 24 hour          Equipment Recommendations  None recommended by PT       Frequency Min 3X/week    Precautions / Restrictions Precautions Precautions: Sternal   Pertinent Vitals/Pain 2/10 pain at times      Mobility  Bed Mobility Bed Mobility: Sit to Sidelying Right;Scooting to HOB Sit to Sidelying Right: 4: Min assist Scooting to [facility]: 4: Min assist Transfers Transfers: Sit to Stand;Stand to Sit Sit to Stand: 4: Min assist Stand to Sit: 4: Min assist Details for Transfer Assistance: VCs for compliance with sternal precautions and use of rocker technique Ambulation/Gait Ambulation/Gait Assistance: 5: Supervision Ambulation Distance (Feet): 160 Feet Assistive device:  (pushing wc) Ambulation/Gait Assistance Details: steady but slow  Gait Pattern: Step-through pattern;Decreased stride length;Trunk flexed;Narrow base of support Gait velocity: decreased General Gait Details: good stability despite slow ambulation    Exercises     PT Diagnosis: Difficulty walking;Acute pain  PT Problem List: Decreased activity tolerance;Decreased balance;Decreased mobility;Cardiopulmonary status limiting activity PT Treatment Interventions: DME instruction;Gait training;Stair training;Functional mobility  training;Therapeutic activities;Therapeutic exercise;Balance training;Patient/family education     PT Goals(Current goals can be found in the care plan section) Acute Rehab PT Goals Patient Stated Goal: to go home PT Goal Formulation: With patient Time For Goal Achievement: 12/13/12 Potential to Achieve Goals: Good  Visit Information  Last PT Received On: 11/29/12 Assistance Needed: +1 History of Present Illness: Pt is 79 y.o. female s/p CABG       Prior Functioning  Home Living Family/patient expects to be discharged to:: Private residence Living Arrangements: Other relatives (grandson) Available Help at Discharge: Family;Available 24 hours/day Type of Home: House Home Access: Stairs to enter [employer] of Steps: 2 Home Layout: One level Home Equipment: Walker - 2 wheels Prior Function Level of Independence: Independent Communication Communication: No difficulties Dominant Hand: Right    Cognition  Cognition Arousal/Alertness: Awake/alert Behavior During Therapy: WFL for tasks assessed/performed Overall Cognitive Status: Within Functional Limits for tasks assessed    Extremity/Trunk Assessment Upper Extremity Assessment Upper Extremity Assessment: Defer to OT evaluation Lower Extremity Assessment Lower Extremity Assessment: Overall WFL for tasks assessed   Balance High Level Balance High Level Balance Activites: Side stepping;Backward walking;Direction changes;Turns High Level Balance Comments: min guard to supervision  End of Session PT - End of Session Equipment Utilized During Treatment: Gait belt;Oxygen Activity Tolerance: Patient tolerated treatment well Patient left: in bed;with call bell/phone within reach Nurse Communication: Mobility status  GP     Fabio Asa 11/29/2012, 10:20 AM Charlotte Crumb, PT DPT  [phone]

## 2012-11-29 NOTE — Plan of Care (Signed)
Problem: Problem: Mobility Progression Goal: PARTICIPATES IN THERAPIES PT consult, slow to progress

## 2012-11-29 NOTE — Progress Notes (Signed)
3 Days Post-Op Procedure(s) (LRB): CORONARY ARTERY BYPASS GRAFTING (CABG) (N/A) INTRAOPERATIVE TRANSESOPHAGEAL ECHOCARDIOGRAM (N/A) Subjective:  No complaints  Objective: Vital signs in last 24 hours: Temp:  [97.8 F (36.6 C)-98.2 F (36.8 C)] 97.8 F (36.6 C) (09/06 1117) Pulse Rate:  [66-80] 76 (09/06 1200) Cardiac Rhythm:  [-] Normal sinus rhythm (09/06 0731) Resp:  [11-17] 17 (09/06 1200) BP: (99-125)/(46-87) 125/65 mmHg (09/06 1200) SpO2:  [90 %-100 %] 97 % (09/06 1200) Weight:  [90.1 kg (198 lb 10.2 oz)] 90.1 kg (198 lb 10.2 oz) (09/06 0341)  Hemodynamic parameters for last 24 hours:    Intake/Output from previous day: 09/05 0701 - 09/06 0700 In: 940 [P.O.:480; I.V.:460] Out: 2105 [Urine:2085; Chest Tube:20] Intake/Output this shift: Total I/O In: 560 [P.O.:480; I.V.:80] Out: 250 [Urine:250]  General appearance: alert and cooperative Heart: regular rate and rhythm, S1, S2 normal, no murmur, click, rub or gallop Lungs: clear to auscultation bilaterally Extremities: edema mild Wound: dressing dry  Lab Results:  Recent Labs  11/28/12 0355 11/29/12 0330  WBC 13.2* 10.8*  HGB 9.1* 8.9*  HCT 27.2* 27.6*  PLT 121* 132*   BMET:  Recent Labs  11/28/12 0355 11/29/12 0330  NA 136 134*  K 4.2 4.3  CL 101 99  CO2 28 29  GLUCOSE 168* 162*  BUN 12 18  CREATININE 0.79 0.78  CALCIUM 7.4* 7.6*    PT/INR:  Recent Labs  11/26/12 1500  LABPROT 16.8*  INR 1.40   ABG    Component Value Date/Time   PHART 7.348* 11/27/2012 0946   HCO3 22.3 11/27/2012 0946   TCO2 23 11/27/2012 1647   ACIDBASEDEF 3.0* 11/27/2012 0946   O2SAT 93.0 11/27/2012 0946   CBG (last 3)   Recent Labs  11/29/12 0320 11/29/12 0715 11/29/12 1115  GLUCAP 150* 124* 143*    Assessment/Plan: S/P Procedure(s) (LRB): CORONARY ARTERY BYPASS GRAFTING (CABG) (N/A) INTRAOPERATIVE TRANSESOPHAGEAL ECHOCARDIOGRAM (N/A) Mobilize Diuresis Diabetes control Continue Levaquin for UTI Will keep in  ICU since she still requires a lot of help and encouragement to ambulate.   LOS: 4 days    BARTLE,BRYAN K 11/29/2012

## 2012-11-30 LAB — GLUCOSE, CAPILLARY
Glucose-Capillary: 151 mg/dL — ABNORMAL HIGH (ref 70–99)
Glucose-Capillary: 131 mg/dL — ABNORMAL HIGH (ref 70–99)
Glucose-Capillary: 116 mg/dL — ABNORMAL HIGH (ref 70–99)
Glucose-Capillary: 127 mg/dL — ABNORMAL HIGH (ref 70–99)
Glucose-Capillary: 124 mg/dL — ABNORMAL HIGH (ref 70–99)
Glucose-Capillary: 117 mg/dL — ABNORMAL HIGH (ref 70–99)

## 2012-11-30 LAB — BASIC METABOLIC PANEL
BUN: 18 mg/dL (ref 6–23)
Chloride: 102 mEq/L (ref 96–112)
GFR calc Af Amer: 76 mL/min — ABNORMAL LOW (ref >90)
Glucose, Bld: 130 mg/dL — ABNORMAL HIGH (ref 70–99)
Potassium: 4 mEq/L (ref 3.5–5.1)
Sodium: 139 mEq/L (ref 135–145)

## 2012-11-30 MED ORDER — MOVING RIGHT ALONG BOOK
Freq: Once | Status: AC
  Administered 2012-11-30: 17:00:00
  Filled 2012-11-30: qty 1

## 2012-11-30 MED ORDER — DOCUSATE SODIUM 100 MG PO CAPS
200.0000 mg | ORAL_CAPSULE | Freq: Every day | ORAL | Status: DC
  Administered 2012-12-02: 200 mg via ORAL
  Filled 2012-11-30 (×2): qty 2

## 2012-11-30 MED ORDER — SODIUM CHLORIDE 0.9 % IJ SOLN
3.0000 mL | Freq: Two times a day (BID) | INTRAMUSCULAR | Status: DC
  Administered 2012-11-30 – 2012-12-01 (×3): 3 mL via INTRAVENOUS

## 2012-11-30 MED ORDER — BISACODYL 5 MG PO TBEC: 10.0000 mg | DELAYED_RELEASE_TABLET | Freq: Every day | ORAL | Status: DC | PRN

## 2012-11-30 MED ORDER — BISACODYL 10 MG RE SUPP: 10.0000 mg | Freq: Every day | RECTAL | Status: DC | PRN

## 2012-11-30 MED ORDER — METFORMIN HCL 500 MG PO TABS
500.0000 mg | ORAL_TABLET | Freq: Two times a day (BID) | ORAL | Status: DC
  Administered 2012-11-30 – 2012-12-02 (×4): 500 mg via ORAL
  Filled 2012-11-30 (×6): qty 1

## 2012-11-30 MED ORDER — FAMOTIDINE 20 MG PO TABS
20.0000 mg | ORAL_TABLET | Freq: Two times a day (BID) | ORAL | Status: DC
  Administered 2012-11-30 – 2012-12-02 (×4): 20 mg via ORAL
  Filled 2012-11-30 (×6): qty 1

## 2012-11-30 MED ORDER — SODIUM CHLORIDE 0.9 % IV SOLN: 250.0000 mL | INTRAVENOUS | Status: DC | PRN

## 2012-11-30 MED ORDER — INSULIN ASPART 100 UNIT/ML ~~LOC~~ SOLN
0.0000 [IU] | Freq: Three times a day (TID) | SUBCUTANEOUS | Status: DC
  Administered 2012-12-01 (×3): 2 [IU] via SUBCUTANEOUS

## 2012-11-30 MED ORDER — ONDANSETRON HCL 4 MG/2ML IJ SOLN
4.0000 mg | Freq: Four times a day (QID) | INTRAMUSCULAR | Status: DC | PRN
  Administered 2012-11-30: 4 mg via INTRAVENOUS
  Filled 2012-11-30: qty 2

## 2012-11-30 MED ORDER — SODIUM CHLORIDE 0.9 % IJ SOLN: 3.0000 mL | INTRAMUSCULAR | Status: DC | PRN

## 2012-11-30 MED ORDER — ONDANSETRON HCL 4 MG PO TABS: 4.0000 mg | ORAL_TABLET | Freq: Four times a day (QID) | ORAL | Status: DC | PRN

## 2012-11-30 MED ORDER — ACETAMINOPHEN 325 MG PO TABS
650.0000 mg | ORAL_TABLET | Freq: Four times a day (QID) | ORAL | Status: DC | PRN
  Administered 2012-12-01: 650 mg via ORAL
  Administered 2012-12-01: 500 mg via ORAL
  Administered 2012-12-02 (×2): 650 mg via ORAL
  Filled 2012-11-30 (×3): qty 2

## 2012-11-30 MED ORDER — PROPRANOLOL HCL 10 MG PO TABS
10.0000 mg | ORAL_TABLET | Freq: Two times a day (BID) | ORAL | Status: DC
  Administered 2012-11-30 – 2012-12-02 (×5): 10 mg via ORAL
  Filled 2012-11-30 (×6): qty 1

## 2012-11-30 MED ORDER — ASPIRIN EC 325 MG PO TBEC
325.0000 mg | DELAYED_RELEASE_TABLET | Freq: Every day | ORAL | Status: DC
  Administered 2012-12-01 – 2012-12-02 (×2): 325 mg via ORAL
  Filled 2012-11-30 (×2): qty 1

## 2012-11-30 NOTE — Progress Notes (Signed)
IV Team member returned page. Informed of pt's MRN and the situation.

## 2012-11-30 NOTE — Progress Notes (Signed)
Patient received to room 2w10. Denies chest pain or shortness of breath. Incisions remain intact with no s/s of infection. C/o nausea; zofran given per orders. VSS. Call bell and family near. Will continue to monitor.Leah Levine

## 2012-11-30 NOTE — Progress Notes (Signed)
Report called to [address] nurse.

## 2012-11-30 NOTE — Progress Notes (Signed)
Applied tourniquet to both arms in search of vein for new peripheral IV access. No veins were visualized or palpated. IV Team was paged at 1120 for assistance with placement of new peripheral access.

## 2012-11-30 NOTE — Progress Notes (Signed)
4 Days Post-Op Procedure(s) (LRB): CORONARY ARTERY BYPASS GRAFTING (CABG) (N/A) INTRAOPERATIVE TRANSESOPHAGEAL ECHOCARDIOGRAM (N/A) Subjective:  No complaints  Objective: Vital signs in last 24 hours: Temp:  [97.4 F (36.3 C)-98.3 F (36.8 C)] 98 F (36.7 C) (09/07 0724) Pulse Rate:  [65-84] 84 (09/07 1100) Cardiac Rhythm:  [-] Normal sinus rhythm (09/07 0800) Resp:  [13-21] 15 (09/07 1100) BP: (95-137)/(39-91) 135/61 mmHg (09/07 1116) SpO2:  [93 %-100 %] 98 % (09/07 1100) Weight:  [87.1 kg (192 lb 0.3 oz)] 87.1 kg (192 lb 0.3 oz) (09/07 0653)  Hemodynamic parameters for last 24 hours:    Intake/Output from previous day: 09/06 0701 - 09/07 0700 In: 1880 [P.O.:1400; I.V.:480] Out: 3575 [Urine:3575] Intake/Output this shift: Total I/O In: 390 [P.O.:300; I.V.:90] Out: 1400 [Urine:1400]  General appearance: alert and cooperative Heart: regular rate and rhythm, S1, S2 normal, no murmur, click, rub or gallop Lungs: clear to auscultation bilaterally Wound: incision ok  Lab Results:  Recent Labs  11/28/12 0355 11/29/12 0330  WBC 13.2* 10.8*  HGB 9.1* 8.9*  HCT 27.2* 27.6*  PLT 121* 132*   BMET:  Recent Labs  11/29/12 0330 11/30/12 0355  NA 134* 139  K 4.3 4.0  CL 99 102  CO2 29 32  GLUCOSE 162* 130*  BUN 18 18  CREATININE 0.78 0.83  CALCIUM 7.6* 8.2*    PT/INR: No results found for this basename: LABPROT, INR,  in the last 72 hours ABG    Component Value Date/Time   PHART 7.348* 11/27/2012 0946   HCO3 22.3 11/27/2012 0946   TCO2 23 11/27/2012 1647   ACIDBASEDEF 3.0* 11/27/2012 0946   O2SAT 93.0 11/27/2012 0946   CBG (last 3)   Recent Labs  11/29/12 2303 11/30/12 0314 11/30/12 0721  GLUCAP 151* 131* 116*    Assessment/Plan: S/P Procedure(s) (LRB): CORONARY ARTERY BYPASS GRAFTING (CABG) (N/A) INTRAOPERATIVE TRANSESOPHAGEAL ECHOCARDIOGRAM (N/A) Mobilize Diuresis Diabetes control Plan for transfer to step-down: see transfer orders She is going to go  home with her sister and should be ready by Tuesday.   LOS: 5 days    BARTLE,BRYAN K 11/30/2012

## 2012-11-30 NOTE — Progress Notes (Signed)
Patient transferred to 2W Room 10. Put on the tele monitor and received by RN in room.

## 2012-12-01 DIAGNOSIS — Z951 Presence of aortocoronary bypass graft: Secondary | ICD-10-CM

## 2012-12-01 LAB — GLUCOSE, CAPILLARY
Glucose-Capillary: 117 mg/dL — ABNORMAL HIGH (ref 70–99)
Glucose-Capillary: 130 mg/dL — ABNORMAL HIGH (ref 70–99)
Glucose-Capillary: 152 mg/dL — ABNORMAL HIGH (ref 70–99)

## 2012-12-01 MED ORDER — FOLIC ACID 1 MG PO TABS
1.0000 mg | ORAL_TABLET | Freq: Every day | ORAL | Status: DC
  Administered 2012-12-01 – 2012-12-02 (×2): 1 mg via ORAL
  Filled 2012-12-01 (×2): qty 1

## 2012-12-01 MED ORDER — FUROSEMIDE 40 MG PO TABS
40.0000 mg | ORAL_TABLET | Freq: Every day | ORAL | Status: DC
  Administered 2012-12-01 – 2012-12-02 (×2): 40 mg via ORAL
  Filled 2012-12-01 (×2): qty 1

## 2012-12-01 MED ORDER — LISINOPRIL 5 MG PO TABS
5.0000 mg | ORAL_TABLET | Freq: Every day | ORAL | Status: DC
  Administered 2012-12-01 – 2012-12-02 (×2): 5 mg via ORAL
  Filled 2012-12-01 (×2): qty 1

## 2012-12-01 MED ORDER — POTASSIUM CHLORIDE CRYS ER 20 MEQ PO TBCR
20.0000 meq | EXTENDED_RELEASE_TABLET | Freq: Once | ORAL | Status: AC
  Administered 2012-12-01: 20 meq via ORAL

## 2012-12-01 MED ORDER — FERROUS SULFATE 325 (65 FE) MG PO TABS
325.0000 mg | ORAL_TABLET | Freq: Every day | ORAL | Status: DC
  Administered 2012-12-01 – 2012-12-02 (×2): 325 mg via ORAL
  Filled 2012-12-01 (×3): qty 1

## 2012-12-01 NOTE — Progress Notes (Signed)
EPW d/c at this time; pt tolerated well; VSS; bedrest until 1145; pt informed; will cont. To monitor.

## 2012-12-01 NOTE — Progress Notes (Signed)
Patient declined offer to ambulate, states she has done enough work for today. Encouraged patient to ambulate at least 3 times tomorrow, reviewed with patient the importance and benefits of regularly ambulating.

## 2012-12-01 NOTE — Progress Notes (Signed)
Pt not wanting to ambulate at this time; sister in room; pt encouraged to ambulate after dinner tonight; will cont. To monitor.

## 2012-12-01 NOTE — Progress Notes (Signed)
[id number] Pt walked earlier with NT. Education completed in case pt for discharge later today. Pt has rolling walker at home for use. Encouraged IS. Reviewed counting carbs with pt and gave diabetic and heart healthy diets. Pt has not been counting carbs or monitoring sugars regularly at home. Knows she needs to check sugars daily. Discussed CRP 2 and pt gave permission to refer to [place] Phase 2. Put on discharge video for pt to view. Knows to walk twice more with staff if not d/ced. Luetta Nutting RNBSN

## 2012-12-01 NOTE — Progress Notes (Signed)
Pt ambulated 350 feet with NT pushing wheelchair; pt to room to bathe after ambulation; call bell w/i reach; will cont. To monitor.

## 2012-12-01 NOTE — Progress Notes (Addendum)
      [address]       [place] [address]             [phone]        5 Days Post-Op Procedure(s) (LRB): CORONARY ARTERY BYPASS GRAFTING (CABG) (N/A) INTRAOPERATIVE TRANSESOPHAGEAL ECHOCARDIOGRAM (N/A)  Subjective: Patient had a bowel movement yesterday. Patient's only complaint is not much appetite.  Objective: Vital signs in last 24 hours: Temp:  [98.1 F (36.7 C)-99.1 F (37.3 C)] 98.9 F (37.2 C) (09/08 0352) Pulse Rate:  [72-84] 74 (09/08 0352) Cardiac Rhythm:  [-] Normal sinus rhythm (09/07 2030) Resp:  [13-22] 18 (09/08 0352) BP: (106-151)/(55-89) 146/77 mmHg (09/08 0352) SpO2:  [92 %-99 %] 92 % (09/08 0352) Weight:  [84.55 kg (186 lb 6.4 oz)] 84.55 kg (186 lb 6.4 oz) (09/08 0352)  Pre op weight  84 kg Current Weight  12/01/12 84.55 kg (186 lb 6.4 oz)      Intake/Output from previous day: 09/07 0701 - 09/08 0700 In: 510 [P.O.:420; I.V.:90] Out: 1400 [Urine:1400]   Physical Exam:  Cardiovascular: RRR, no murmurs, gallops, or rubs. Pulmonary: Clear to auscultation bilaterally; no rales, wheezes, or rhonchi. Abdomen: Soft, non tender, bowel sounds present. Extremities: Trace bilateral lower extremity edema. Minor ecchymosis right thigh Wounds: Clean and dry.  No erythema or signs of infection.  Lab Results: CBC: Recent Labs  11/29/12 0330  WBC 10.8*  HGB 8.9*  HCT 27.6*  PLT 132*   BMET:  Recent Labs  11/29/12 0330 11/30/12 0355  NA 134* 139  K 4.3 4.0  CL 99 102  CO2 29 32  GLUCOSE 162* 130*  BUN 18 18  CREATININE 0.78 0.83  CALCIUM 7.6* 8.2*    PT/INR:  Lab Results  Component Value Date   INR 1.40 11/26/2012   INR 1.00 11/26/2012   INR 0.97 11/25/2012   ABG:  INR: Will add last result for INR, ABG once components are confirmed Will add last 4 CBG results once components are confirmed  Assessment/Plan:  1. CV - SR. On Propanolol 10 bid. Hypertensive-SBP in the 140-150's.WIll start Lisinopril for better bp  control. 2.  Pulmonary - Encourage incentive spirometer 3.  Acute blood loss anemia - Last H and H 8.9 and 27.6.Start Ferrous 4.DM-CBGs 124/117/117. On Metformin 500 bid. 5.Remove EPW 6.Mild volume overload-lasix today 7.Possibly discharge later today vs am  ZIMMERMAN,[name] MPA-C 12/01/2012,7:31 AM  Plan D/C tomorrow when family there to care for her. I have seen and examined Leah Levine and agree with the above assessment  and plan.  Delight Ovens MD Beeper [phone] Office [phone] 12/01/2012 3:13 PM

## 2012-12-01 NOTE — Progress Notes (Signed)
Pt ambulated 550 feet pushing wheelchair with RN; steady gait noted; pt back to room to bed; call bell w/i reach; will cont. To monitor.

## 2012-12-01 NOTE — Discharge Summary (Signed)
Physician Discharge Summary  Patient ID: Leah Levine MRN: [id number] DOB/AGE: [id number] y.o.  Admit date: 11/25/2012 Discharge date: 12/02/2012  Admission Diagnoses:  Patient Active Problem List   Diagnosis Date Noted  . S/P CABG x 4 12/01/2012  . Vitamin B12 deficiency 12/05/2010  . Personal history of colonic polyps 12/05/2010  . Bile salt-induced diarrhea 12/05/2010  . Other symptoms involving digestive system 11/20/2010  . Esophageal dysphagia 11/20/2010  . B12 deficiency 11/20/2010  . Benign neoplasm of colon 11/20/2010  . Gastritis, chronic 11/20/2010  . Change in bowel habits 11/16/2010  . Diarrhea in adult patient 11/16/2010  . RUQ abdominal pain 11/16/2010  . Allergic rhinitis 07/03/2010  . Tremor   . GERD (gastroesophageal reflux disease)   . Hyperlipidemia   . Hypertension   . Urinary incontinence   . Diabetes mellitus type 2, controlled    Discharge Diagnoses:   Patient Active Problem List   Diagnosis Date Noted  . S/P CABG x 4 12/01/2012  . Vitamin B12 deficiency 12/05/2010  . Personal history of colonic polyps 12/05/2010  . Bile salt-induced diarrhea 12/05/2010  . Other symptoms involving digestive system 11/20/2010  . Esophageal dysphagia 11/20/2010  . B12 deficiency 11/20/2010  . Benign neoplasm of colon 11/20/2010  . Gastritis, chronic 11/20/2010  . Change in bowel habits 11/16/2010  . Diarrhea in adult patient 11/16/2010  . RUQ abdominal pain 11/16/2010  . Allergic rhinitis 07/03/2010  . Tremor   . GERD (gastroesophageal reflux disease)   . Hyperlipidemia   . Hypertension   . Urinary incontinence   . Diabetes mellitus type 2, controlled     Discharged Condition: good  History of Present Illness:    Ms. Darwish is a 79 yo female with no previous cardiac history.  She developed chest pain about 10 days prior to admission.  She states this began intermittently at rest but also occurred during exertion.  She contacted EMS but she did  not end up being evaluated in the Emergency Department.  The patient then took a bus to [place] and attended 2 baseball games.  During that time the patient continued to experience chest pain which was increased in frequency and severity in comparison to her prior symptoms.  Upon arrival back to [facility] the patient presented to [facility].  Workup showed ST depression and an elevated Troponin of 0.96.  She was subsequently transferred to [facility] and admitted for further Cardiac workup.   Hospital Course:   Upon arrival patient was placed on NTG drip.  She was chest pain free.  She was taken to the catheterization lab on 11/25/2012 which revealed multivessel CAD with a preserved EF.  It was felt Coronary bypass surgery would be her best treatment option and TCTS was consulted.  The patient was evaluated by Dr. Tyrone Sage who agreed with Cardiology's assessment.  The risks and benefits of the procedure were explained to the patient and she was agreeable to proceed. The patient developed stroke like symptoms the evening of her cardiac catheterization.  She was evaluated by Neurology who felt the patient could have suffered from a left subcortical cerebrovascular insult.  CT scan showed no evidence of acute abnormality.  She was taken to the operating room on 11/26/2012.  She underwent CABG x 4 utilizing LIMA to LAD, SVG to Diagonal, SVG to Left Circumflex, and SVG to RCA.  She also underwent EVH of right leg.  She tolerated the procedure well and was taken to the SICU in stable  condition.  During her stay in the ICU the patient was extubated on POD #1.  Her chest tubes and lines were removed without difficulty.  She was placed on Levaquin for UTI treatment identified prior to surgery.  She was evaluated by Neurology, who felt the patient should continue her ASA therapy and no further inpatient treatment would be necessary.  The patient was maintaining NSR.  She was ambulating with assistance.  Once medically  stable she was transferred to the Telemetry unit in stable condition.  The patient has continued to progress.  She continues to maintain NSR and her pacing wires were removed without difficulty.  She developed hypertension and was placed on Lisinopril for better control.  The patient is tolerating her diet.  The patient is medically stable at this time.  Should no further issues arise we anticipate discharge home in the next 24-48 hours.  She will follow up with Dr. Tyrone Sage in 3 weeks with a CXR.  She will need to follow up with [facility].  Finally she will need to follow up with Neurology in 2 months.           Consults: neurology  Significant Diagnostic Studies:   Hemodynamics:  AO 115/63  LV 123/26  Coronary angiography:  Coronary dominance: right  Left mainstem: 20% stenosis.  Left anterior descending (LAD): There is a large high D1 with 60% proximal stenosis. After D1, there are two serial 70% stenoses in the proximal LAD proximal and distal to 1st septal.  Left circumflex (LCx): Subtotal occlusion of the proximal LCx after small OM1. The mid to distal LCx with a moderate OM2 and a PLOM fills very late. There are no significant collaterals.  Right coronary artery (RCA): RCA is totally occluded proximally. There are good collaterals to PLV and PDA from the LAD.  Left ventriculography: Left ventricular systolic function is normal, LVEF is estimated at 55-60%, there is no significant mitral regurgitation   Treatments: surgery:   Coronary artery bypass grafting x4 with the left  internal mammary to the left anterior descending coronary artery,  reverse saphenous vein graft to the diagonal coronary artery, reverse  saphenous vein graft to the distal circumflex coronary artery, reverse  saphenous vein graft to the distal right coronary artery with right  thigh and calf endo vein harvesting.  Disposition:  Home  Discharge medications:    Medication List    STOP taking these  medications       aspirin 81 MG tablet  Replaced by:  aspirin 325 MG EC tablet      TAKE these medications       acetaminophen 325 MG tablet  Commonly known as:  TYLENOL  Take 2 tablets (650 mg total) by mouth every 6 (six) hours as needed for pain.     aspirin 325 MG EC tablet  Take 1 tablet (325 mg total) by mouth daily.     atorvastatin 80 MG tablet  Commonly known as:  LIPITOR  Take 1 tablet (80 mg total) by mouth daily at 6 PM.     ezetimibe 10 MG tablet  Commonly known as:  ZETIA  Take 1 tablet (10 mg total) by mouth daily.     ferrous sulfate 325 (65 FE) MG tablet  Take 1 tablet (325 mg total) by mouth daily with breakfast. For one month then stop     folic acid 1 MG tablet  Commonly known as:  FOLVITE  Take 1 tablet (1 mg total)  by mouth daily. For one month then stop.     INVESTIGATIONAL DRUG SIMPLE RECORD  Take 7.5 mg by mouth 2 (two) times daily.     levofloxacin 250 MG tablet  Commonly known as:  LEVAQUIN  Take 1 tablet (250 mg total) by mouth daily. For 3 days then stop.     levothyroxine 100 MCG tablet  Commonly known as:  SYNTHROID, LEVOTHROID  Take 100 mcg by mouth daily.     lisinopril 5 MG tablet  Commonly known as:  PRINIVIL,ZESTRIL  Take 1 tablet (5 mg total) by mouth daily.     nystatin cream  Commonly known as:  MYCOSTATIN  Apply topically 2 (two) times daily.     PEPTO-BISMOL PO  Take by mouth as needed.     primidone 50 MG tablet  Commonly known as:  MYSOLINE  Take 50 mg by mouth 3 (three) times daily.     propranolol 10 MG tablet  Commonly known as:  INDERAL  Take 1 tablet (10 mg total) by mouth 2 (two) times daily.     sitaGLIPtin-metformin 50-500 MG per tablet  Commonly known as:  JANUMET  Take 1 tablet by mouth 2 (two) times daily with a meal.     traMADol 50 MG tablet  Commonly known as:  ULTRAM  Take 1 tablet (50 mg total) by mouth every 6 (six) hours as needed for pain.       The patient has been discharged  on:   1.Beta Blocker:  Yes [ x  ]                              No   [   ]                              If No, reason:  2.Ace Inhibitor/ARB: Yes [  x ]                                     No  [    ]                                     If No, reason:  3.Statin:   Yes [x   ]                  No  [   ]                  If No, reason:  4.Ecasa:  Yes  [  x ]                  No   [   ]                  If No, reason:     Discharge Orders   Future Appointments Provider Department Dept Phone   12/24/2012 9:00 AM Ronal Fear, NP [facility] [phone]   12/25/2012 4:30 PM Delight Ovens, MD [facility] [phone]   Future Orders Complete By Expires   Amb Referral to Cardiac Rehabilitation  As directed      Follow-up Information   Follow up with  Delight Ovens, MD On 12/25/2012. (Appointment is at 4:30)    Specialty:  Cardiothoracic Surgery   Contact information:   [address] [place] [address] [place] [address] [phone]       Follow up with [facility]. (Please get CXR 1 hour prior to his appointment)    Contact information:   [place]       Schedule an appointment as soon as possible for a visit with Marca Ancona, MD. (Please contact office to set up 2-4 week follow up)    Specialty:  Cardiology   Contact information:   [address] [place] [address] [phone]       Schedule an appointment as soon as possible for a visit with Gates Rigg, MD. (Please contact office to set up 2 month follow up)    Specialties:  Neurology, Radiology   Contact information:   [address] [place] [address] [phone]       Signed: Ardelle Balls 12/02/2012, 8:05 AM

## 2012-12-02 LAB — GLUCOSE, CAPILLARY
Glucose-Capillary: 116 mg/dL — ABNORMAL HIGH (ref 70–99)
Glucose-Capillary: 107 mg/dL — ABNORMAL HIGH (ref 70–99)

## 2012-12-02 MED ORDER — FOLIC ACID 1 MG PO TABS: 1.0000 mg | ORAL_TABLET | Freq: Every day | ORAL | Status: DC

## 2012-12-02 MED ORDER — ASPIRIN 325 MG PO TBEC: 325.0000 mg | DELAYED_RELEASE_TABLET | Freq: Every day | ORAL | Status: DC

## 2012-12-02 MED ORDER — ATORVASTATIN CALCIUM 80 MG PO TABS: 80.0000 mg | ORAL_TABLET | Freq: Every day | ORAL | Status: DC

## 2012-12-02 MED ORDER — TRAMADOL HCL 50 MG PO TABS: 50.0000 mg | ORAL_TABLET | Freq: Four times a day (QID) | ORAL | Status: DC | PRN

## 2012-12-02 MED ORDER — ACETAMINOPHEN 325 MG PO TABS: 650.0000 mg | ORAL_TABLET | Freq: Four times a day (QID) | ORAL | Status: DC | PRN

## 2012-12-02 MED ORDER — FERROUS SULFATE 325 (65 FE) MG PO TABS: 325.0000 mg | ORAL_TABLET | Freq: Every day | ORAL | Status: DC

## 2012-12-02 MED ORDER — EZETIMIBE 10 MG PO TABS: 10.0000 mg | ORAL_TABLET | Freq: Every day | ORAL | Status: DC

## 2012-12-02 MED ORDER — LISINOPRIL 5 MG PO TABS: 5.0000 mg | ORAL_TABLET | Freq: Every day | ORAL | Status: DC

## 2012-12-02 MED ORDER — PROPRANOLOL HCL 10 MG PO TABS: 60.0000 mg | ORAL_TABLET | Freq: Two times a day (BID) | ORAL | Status: DC

## 2012-12-02 MED ORDER — PROPRANOLOL HCL 10 MG PO TABS: 10.0000 mg | ORAL_TABLET | Freq: Two times a day (BID) | ORAL | Status: DC

## 2012-12-02 MED ORDER — LEVOFLOXACIN 250 MG PO TABS: 250.0000 mg | ORAL_TABLET | Freq: Every day | ORAL | Status: DC

## 2012-12-02 MED ORDER — STUDY - INVESTIGATIONAL DRUG SIMPLE RECORD: 7.5000 mg | Freq: Two times a day (BID) | Status: DC

## 2012-12-02 NOTE — Progress Notes (Signed)
CARDIAC REHAB PHASE I   PRE:  Rate/Rhythm: 91SR  BP:  Supine:   Sitting: 128/62  Standing:    SaO2: 96%RA  MODE:  Ambulation: 500 ft   POST:  Rate/Rhythm: 96SR  BP:  Supine:   Sitting: 138/80  Standing:    SaO2: 97%RA 1032-1100 Pt walked 500 ft on RA with rolling walker and minimal asst. Tolerated well. To recliner after walk.   Luetta Nutting, RN BSN  12/02/2012 10:58 AM

## 2012-12-02 NOTE — Progress Notes (Signed)
Pt discharged per MD order and protocol. Discharge instructions reviewed with patient and all questions answered. Pt given all prescriptions. Pt aware of follow up appointments.

## 2012-12-02 NOTE — Progress Notes (Addendum)
      [address]       [place] [address]             [phone]        6 Days Post-Op Procedure(s) (LRB): CORONARY ARTERY BYPASS GRAFTING (CABG) (N/A) INTRAOPERATIVE TRANSESOPHAGEAL ECHOCARDIOGRAM (N/A)  Subjective: Patient without complaints and wants to go home.  Objective: Vital signs in last 24 hours: Temp:  [98 F (36.7 C)-98.7 F (37.1 C)] 98 F (36.7 C) (09/09 0534) Pulse Rate:  [80-84] 81 (09/09 0534) Cardiac Rhythm:  [-] Normal sinus rhythm (09/08 2019) Resp:  [16-18] 18 (09/09 0534) BP: (132-153)/(65-89) 132/74 mmHg (09/09 0534) SpO2:  [94 %-96 %] 95 % (09/09 0534) Weight:  [81.8 kg (180 lb 5.4 oz)] 81.8 kg (180 lb 5.4 oz) (09/09 0534)  Pre op weight  84 kg Current Weight  12/02/12 81.8 kg (180 lb 5.4 oz)      Intake/Output from previous day: 09/08 0701 - 09/09 0700 In: 600 [P.O.:600] Out: 850 [Urine:850]   Physical Exam:  Cardiovascular: RRR, no murmurs, gallops, or rubs. Pulmonary: Clear to auscultation bilaterally; no rales, wheezes, or rhonchi. Abdomen: Soft, non tender, bowel sounds present. Extremities: No lower extremity edema. Minor ecchymosis right thigh Wounds: Clean and dry.  No erythema or signs of infection.  Lab Results: CBC:No results found for this basename: WBC, HGB, HCT, PLT,  in the last 72 hours BMET:   Recent Labs  11/30/12 0355  NA 139  K 4.0  CL 102  CO2 32  GLUCOSE 130*  BUN 18  CREATININE 0.83  CALCIUM 8.2*    PT/INR:  Lab Results  Component Value Date   INR 1.40 11/26/2012   INR 1.00 11/26/2012   INR 0.97 11/25/2012   ABG:  INR: Will add last result for INR, ABG once components are confirmed Will add last 4 CBG results once components are confirmed  Assessment/Plan:  1. CV - SR. On Propanolol 10 bid and Lisinopril 5 daily. 2.  Pulmonary - Encourage incentive spirometer 3.  Acute blood loss anemia - Last H and H 8.9 and 27.6.Start Ferrous 4.DM-CBGs 130/152/116. On Metformin 500  bid. 5.Remove EPW 6.Mild volume overload-lasix today but will not need at discharge as is below pre op weight 7.Remove sutures 8.UTI-continue Levaquin for a total of 7 days 9.Discharge home today  Kelsee Preslar MPA-C 12/02/2012,7:26 AM

## 2012-12-02 NOTE — Progress Notes (Signed)
CT sutures removed per MD order and protocol. Sterri strips applied. Pt tolerated procedure well. Will continue to monitor.  

## 2012-12-12 DIAGNOSIS — Z48812 Encounter for surgical aftercare following surgery on the circulatory system: Secondary | ICD-10-CM

## 2012-12-13 ENCOUNTER — Encounter (HOSPITAL_COMMUNITY): Payer: Self-pay | Admitting: *Deleted

## 2012-12-13 ENCOUNTER — Observation Stay (HOSPITAL_COMMUNITY)
Admission: EM | Admit: 2012-12-13 | Discharge: 2012-12-14 | Disposition: A | Discharge status: Home / Self Care | Payer: Federal, State, Local not specified - PPO | Attending: Internal Medicine | Admitting: Internal Medicine

## 2012-12-13 ENCOUNTER — Emergency Department (HOSPITAL_COMMUNITY): Payer: Federal, State, Local not specified - PPO

## 2012-12-13 DIAGNOSIS — R0789 Other chest pain: Principal | ICD-10-CM | POA: Insufficient documentation

## 2012-12-13 DIAGNOSIS — Z79899 Other long term (current) drug therapy: Secondary | ICD-10-CM | POA: Insufficient documentation

## 2012-12-13 DIAGNOSIS — I252 Old myocardial infarction: Secondary | ICD-10-CM | POA: Insufficient documentation

## 2012-12-13 DIAGNOSIS — I10 Essential (primary) hypertension: Secondary | ICD-10-CM | POA: Insufficient documentation

## 2012-12-13 DIAGNOSIS — Z951 Presence of aortocoronary bypass graft: Secondary | ICD-10-CM | POA: Insufficient documentation

## 2012-12-13 DIAGNOSIS — E119 Type 2 diabetes mellitus without complications: Secondary | ICD-10-CM | POA: Insufficient documentation

## 2012-12-13 DIAGNOSIS — R079 Chest pain, unspecified: Secondary | ICD-10-CM | POA: Diagnosis present

## 2012-12-13 DIAGNOSIS — E1129 Type 2 diabetes mellitus with other diabetic kidney complication: Secondary | ICD-10-CM | POA: Diagnosis present

## 2012-12-13 LAB — COMPREHENSIVE METABOLIC PANEL
AST: 28 U/L (ref 0–37)
BUN: 28 mg/dL — ABNORMAL HIGH (ref 6–23)
CO2: 26 mEq/L (ref 19–32)
Calcium: 9.2 mg/dL (ref 8.4–10.5)
Chloride: 98 mEq/L (ref 96–112)
Creatinine, Ser: 1.18 mg/dL — ABNORMAL HIGH (ref 0.50–1.10)
GFR calc Af Amer: 50 mL/min — ABNORMAL LOW (ref >90)
GFR calc non Af Amer: 43 mL/min — ABNORMAL LOW (ref >90)
Glucose, Bld: 125 mg/dL — ABNORMAL HIGH (ref 70–99)
Total Bilirubin: 0.4 mg/dL (ref 0.3–1.2)

## 2012-12-13 LAB — TROPONIN I
Troponin I: <0.3 ng/mL (ref <0.30)
Troponin I: <0.3 ng/mL (ref <0.30)

## 2012-12-13 LAB — CBC WITH DIFFERENTIAL/PLATELET
Eosinophils Relative: 13 % — ABNORMAL HIGH (ref 0–5)
HCT: 37.3 % (ref 36.0–46.0)
Hemoglobin: 12.4 g/dL (ref 12.0–15.0)
Lymphocytes Relative: 22 % (ref 12–46)
Lymphs Abs: 2.6 10*3/uL (ref 0.7–4.0)
MCV: 81.3 fL (ref 78.0–100.0)
Monocytes Absolute: 0.8 10*3/uL (ref 0.1–1.0)
Monocytes Relative: 7 % (ref 3–12)
Neutro Abs: 6.5 10*3/uL (ref 1.7–7.7)
WBC: 11.5 10*3/uL — ABNORMAL HIGH (ref 4.0–10.5)

## 2012-12-13 MED ORDER — SODIUM CHLORIDE 0.9 % IV BOLUS (SEPSIS)
500.0000 mL | Freq: Once | INTRAVENOUS | Status: AC
  Administered 2012-12-13: 500 mL via INTRAVENOUS

## 2012-12-13 NOTE — ED Notes (Signed)
Pt presents today with c/o sore throat after having bypass surgery 3wks ago. Pt also states she had some left sided cp yesterday but has not had any since then. Pt denied any sob, n/v or diaphoresis and states pain was sharp in nature. Pt now c/o of left sided cp dull in nature, rates pain 3/10.

## 2012-12-13 NOTE — ED Notes (Signed)
The pt has had a sore throat  Today no temp.  Bypassed 3 weeks ago

## 2012-12-14 ENCOUNTER — Observation Stay (HOSPITAL_COMMUNITY): Payer: Federal, State, Local not specified - PPO

## 2012-12-14 DIAGNOSIS — E119 Type 2 diabetes mellitus without complications: Secondary | ICD-10-CM

## 2012-12-14 DIAGNOSIS — R079 Chest pain, unspecified: Secondary | ICD-10-CM

## 2012-12-14 LAB — GLUCOSE, CAPILLARY: Glucose-Capillary: 109 mg/dL — ABNORMAL HIGH (ref 70–99)

## 2012-12-14 LAB — D-DIMER, QUANTITATIVE: D-Dimer, Quant: 2.54 ug/mL-FEU — ABNORMAL HIGH (ref 0.00–0.48)

## 2012-12-14 MED ORDER — EZETIMIBE 10 MG PO TABS
10.0000 mg | ORAL_TABLET | Freq: Every day | ORAL | Status: DC
  Administered 2012-12-14: 10 mg via ORAL
  Filled 2012-12-14: qty 1

## 2012-12-14 MED ORDER — KETOROLAC TROMETHAMINE 30 MG/ML IJ SOLN
30.0000 mg | Freq: Once | INTRAMUSCULAR | Status: AC
  Administered 2012-12-14: 30 mg via INTRAVENOUS
  Filled 2012-12-14: qty 1

## 2012-12-14 MED ORDER — STUDY - INVESTIGATIONAL DRUG SIMPLE RECORD
7.5000 mg | Freq: Two times a day (BID) | Status: DC
  Filled 2012-12-14: qty 7.5

## 2012-12-14 MED ORDER — ACETAMINOPHEN 325 MG PO TABS: 650.0000 mg | ORAL_TABLET | Freq: Four times a day (QID) | ORAL | Status: DC | PRN

## 2012-12-14 MED ORDER — LISINOPRIL 5 MG PO TABS
5.0000 mg | ORAL_TABLET | Freq: Every day | ORAL | Status: DC
  Administered 2012-12-14: 5 mg via ORAL
  Filled 2012-12-14: qty 1

## 2012-12-14 MED ORDER — HEPARIN SODIUM (PORCINE) 5000 UNIT/ML IJ SOLN
5000.0000 [IU] | Freq: Three times a day (TID) | INTRAMUSCULAR | Status: DC
  Administered 2012-12-14 (×2): 5000 [IU] via SUBCUTANEOUS
  Filled 2012-12-14 (×4): qty 1

## 2012-12-14 MED ORDER — PROPRANOLOL HCL 10 MG PO TABS
10.0000 mg | ORAL_TABLET | Freq: Two times a day (BID) | ORAL | Status: DC
  Administered 2012-12-14: 10 mg via ORAL
  Filled 2012-12-14 (×3): qty 1

## 2012-12-14 MED ORDER — SODIUM CHLORIDE 0.9 % IJ SOLN
3.0000 mL | Freq: Two times a day (BID) | INTRAMUSCULAR | Status: DC
  Administered 2012-12-14 (×2): 3 mL via INTRAVENOUS

## 2012-12-14 MED ORDER — SENNOSIDES 8.6 MG PO TABS: 2.0000 | ORAL_TABLET | Freq: Every evening | ORAL | Status: DC | PRN

## 2012-12-14 MED ORDER — SENNA 8.6 MG PO TABS
2.0000 | ORAL_TABLET | Freq: Every evening | ORAL | Status: DC | PRN
  Filled 2012-12-14: qty 2

## 2012-12-14 MED ORDER — LEVOTHYROXINE SODIUM 100 MCG PO TABS
100.0000 ug | ORAL_TABLET | Freq: Every day | ORAL | Status: DC
  Administered 2012-12-14: 100 ug via ORAL
  Filled 2012-12-14 (×2): qty 1

## 2012-12-14 MED ORDER — FERROUS SULFATE 325 (65 FE) MG PO TABS
325.0000 mg | ORAL_TABLET | Freq: Every day | ORAL | Status: DC
  Administered 2012-12-14: 325 mg via ORAL
  Filled 2012-12-14 (×2): qty 1

## 2012-12-14 MED ORDER — IOHEXOL 350 MG/ML SOLN
65.0000 mL | Freq: Once | INTRAVENOUS | Status: AC | PRN
  Administered 2012-12-14: 65 mL via INTRAVENOUS

## 2012-12-14 MED ORDER — INSULIN ASPART 100 UNIT/ML ~~LOC~~ SOLN
0.0000 [IU] | Freq: Three times a day (TID) | SUBCUTANEOUS | Status: DC
  Administered 2012-12-14: 2 [IU] via SUBCUTANEOUS

## 2012-12-14 MED ORDER — FOLIC ACID 1 MG PO TABS
1.0000 mg | ORAL_TABLET | Freq: Every day | ORAL | Status: DC
  Administered 2012-12-14: 1 mg via ORAL
  Filled 2012-12-14: qty 1

## 2012-12-14 MED ORDER — SODIUM CHLORIDE 0.9 % IV SOLN
INTRAVENOUS | Status: DC
  Administered 2012-12-14: 11:00:00 via INTRAVENOUS

## 2012-12-14 MED ORDER — ASPIRIN EC 325 MG PO TBEC
325.0000 mg | DELAYED_RELEASE_TABLET | Freq: Every day | ORAL | Status: DC
  Administered 2012-12-14: 325 mg via ORAL
  Filled 2012-12-14: qty 1

## 2012-12-14 MED ORDER — PRIMIDONE 50 MG PO TABS
50.0000 mg | ORAL_TABLET | Freq: Three times a day (TID) | ORAL | Status: DC
  Administered 2012-12-14 (×2): 50 mg via ORAL
  Filled 2012-12-14 (×3): qty 1

## 2012-12-14 MED ORDER — ATORVASTATIN CALCIUM 80 MG PO TABS
80.0000 mg | ORAL_TABLET | Freq: Every day | ORAL | Status: DC
  Administered 2012-12-14: 80 mg via ORAL
  Filled 2012-12-14: qty 1

## 2012-12-14 MED ORDER — TRAMADOL HCL 50 MG PO TABS
50.0000 mg | ORAL_TABLET | Freq: Four times a day (QID) | ORAL | Status: DC | PRN
  Administered 2012-12-14: 50 mg via ORAL
  Filled 2012-12-14: qty 1

## 2012-12-14 NOTE — Discharge Summary (Signed)
Physician Discharge Summary  Leah Levine ZOX:[id number] DOB: 08/04/1932 DOA: 12/13/2012  PCP: Rudi Heap, MD  Admit date: 12/13/2012 Discharge date: 12/14/2012  Time spent: 35  minutes  Recommendations for Outpatient Follow-up:  1. Need B-met to follow renal function. 2. Need to follow up with Cardiothoracic Sx.   Discharge Diagnoses:    Chest pain, secondary to pleuresis   Diabetes mellitus type 2, controlled   S/P CABG x 4   Discharge Condition: Stable  Diet recommendation: [employer] Healthy  Filed Weights   12/14/12 0136  Weight: 81.421 kg (179 lb 8 oz)    History of present illness:  Leah Levine is a 79 y.o. female s/p CABG done some 18 days ago for NSTEMI who presents with 2 episodes yesterday of 5 seconds of sharp left chest pain located under her left breast. She also had a 10 min episode of chest pressure left sided today in the ED. Troponins were negative x2 in the ED before the chest pressure episode. ED called cardiology and they want the patient observed for serial troponins but do doubt that this is ACS.   Hospital Course:  1-Chest pain: pleuritic in nature, worse with deep breath and cough. Cardiac enzymes times 3 negative. D dimer elevated. CT angio negative for PE. Will give one dose of toradol. Continue with tramadol at discharge.  2-Diabetes: will hold Janumet-metformin for 48 at discharge due to recent contrast exposure.   Procedures:  CT angio: negative for PE  Consultations:  Dr Ladona Ridgel  Discharge Exam: Filed Vitals:   12/14/12 1415  BP: 106/49  Pulse: 63  Temp: 97.4 F (36.3 C)  Resp: 18    General: no distress, Cardiovascular: S 1, S 2 RRR Respiratory: CTA  Discharge Instructions  Discharge Orders   Future Appointments Provider Department Dept Phone   12/24/2012 9:00 AM Ronal Fear, NP [facility] [phone]   12/25/2012 4:30 PM Delight Ovens, MD [facility]  [phone]   Future Orders Complete By Expires   Diet - low sodium heart healthy  As directed    Increase activity slowly  As directed        Medication List    STOP taking these medications       sitaGLIPtin-metformin 50-500 MG per tablet  Commonly known as:  JANUMET      TAKE these medications       acetaminophen 325 MG tablet  Commonly known as:  TYLENOL  Take 2 tablets (650 mg total) by mouth every 6 (six) hours as needed for pain.     aspirin 325 MG EC tablet  Take 1 tablet (325 mg total) by mouth daily.     atorvastatin 80 MG tablet  Commonly known as:  LIPITOR  Take 1 tablet (80 mg total) by mouth daily at 6 PM.     ezetimibe 10 MG tablet  Commonly known as:  ZETIA  Take 1 tablet (10 mg total) by mouth daily.     ferrous sulfate 325 (65 FE) MG tablet  Take 1 tablet (325 mg total) by mouth daily with breakfast. For one month then stop     folic acid 1 MG tablet  Commonly known as:  FOLVITE  Take 1 tablet (1 mg total) by mouth daily. For one month then stop.     INVESTIGATIONAL DRUG SIMPLE RECORD  Take 7.5 mg by mouth 2 (two) times daily.     levothyroxine 100 MCG tablet  Commonly known  as:  SYNTHROID, LEVOTHROID  Take 100 mcg by mouth daily.     lisinopril 5 MG tablet  Commonly known as:  PRINIVIL,ZESTRIL  Take 1 tablet (5 mg total) by mouth daily.     nystatin cream  Commonly known as:  MYCOSTATIN  Apply topically 2 (two) times daily.     PEPTO-BISMOL PO  Take 1 tablet by mouth daily as needed (for indigestion).     primidone 50 MG tablet  Commonly known as:  MYSOLINE  Take 50 mg by mouth 3 (three) times daily.     propranolol 10 MG tablet  Commonly known as:  INDERAL  Take 10 mg by mouth 2 (two) times daily.     senna 8.6 MG tablet  Commonly known as:  SENOKOT  Take 2 tablets by mouth at bedtime as needed for constipation.     traMADol 50 MG tablet  Commonly known as:  ULTRAM  Take 1 tablet (50 mg total) by mouth every 6 (six) hours as  needed for pain.       Allergies  Allergen Reactions  . Celebrex [Celecoxib]     Muscle aches  . Crestor [Rosuvastatin Calcium]     Muscle aches  . Fenofibrate     Muscle aches  . Hydrocodone   . Lipitor [Atorvastatin Calcium]     Muscle aches  . Simvastatin     Muscle aches       Follow-up Information   Follow up with Rudi Heap, MD In 1 week.   Specialty:  Family Medicine   Contact information:   [address] [place] [address] [phone]        The results of significant diagnostics from this hospitalization (including imaging, microbiology, ancillary and laboratory) are listed below for reference.    Significant Diagnostic Studies: Dg Chest 2 View  12/13/2012   CLINICAL DATA:  Chest pain  EXAM: CHEST  2 VIEW  COMPARISON:  11/28/2012  FINDINGS: Mild cardiomegaly, chronic. Status post CABG. No acute change in mediastinal contours.  Small left pleural effusion.  Chronic centrally calcified pulmonary nodule in the peripheral left mid lung. No edema, infiltrate, or pneumothorax. No acute osseous findings.  IMPRESSION: 1. Small left pleural effusion. 2. Cardiomegaly without failure.   Electronically Signed   By: Tiburcio Pea   On: 12/13/2012 22:24   Dg Chest 2 View  11/25/2012   *RADIOLOGY REPORT*  Clinical Data: Chest pain  CHEST - 2 VIEW  Comparison: Prior radiograph from 10/07/2007  Findings: Cardiac and mediastinal silhouettes are stable in size and contour, and remain within normal limits.  The lungs are normally inflated.  No airspace consolidation, pleural effusion, or pulmonary edema is identified.  There is no pneumothorax.  9 mm nodular opacity overlying the left mid lung is grossly stable as compared to the prior examination.  No acute osseous abnormality identified.  IMPRESSION:  No acute cardiopulmonary process.   Original Report Authenticated By: Rise Mu, M.D.   Ct Head Wo Contrast  11/25/2012   *RADIOLOGY REPORT*  Clinical Data: Mental  status changes after cardiac cath today.  CT HEAD WITHOUT CONTRAST  Technique:  Contiguous axial images were obtained from the base of the skull through the vertex without contrast.  Comparison: Head CT 10/10/2003  Findings: Negative for acute hemorrhage.  The ventricles are normal in size and stable.  There is no midline shift or evidence of acute cortically based infarction.  The skull is intact and the visualized paranasal sinuses and  mastoid air cells are clear.  IMPRESSION: No acute intracranial abnormality.   Original Report Authenticated By: Britta Mccreedy, M.D.   Ct Angio Chest Pe W/cm &/or Wo Cm  12/14/2012   CLINICAL DATA:  Left chest pain, recent CABG for NSTEMI  EXAM: CT ANGIOGRAPHY CHEST WITH CONTRAST  TECHNIQUE: Multidetector CT imaging of the chest was performed using the standard protocol during bolus administration of intravenous contrast. Multiplanar CT image reconstructions including MIPs were obtained to evaluate the vascular anatomy.  CONTRAST:  65mL OMNIPAQUE IOHEXOL 350 MG/ML SOLN  COMPARISON:  Chest radiograph dated 12/13/2012  FINDINGS: No evidence of pulmonary embolism.  11 mm calcified granuloma in the left upper lobe (series 6/ image 32).  Small left pleural effusion with associated mild left basilar atelectasis. No pneumothorax.  Visualized thyroid is unremarkable.  Cardiomegaly. No pericardial effusion. Coronary atherosclerosis. Postsurgical changes related to prior CABG. Atherosclerotic calcifications of the aortic arch.  Visualized upper abdomen are notable for splenic granulomata and cholecystectomy clips.  Degenerative changes of the visualized thoracolumbar spine.  Review of the MIP images confirms the above findings.  IMPRESSION: No evidence of pulmonary embolism.  Small left pleural effusion with associated mild left basilar atelectasis.  Cardiomegaly. Postsurgical changes related to prior CABG.   Electronically Signed   By: Charline Bills M.D.   On: 12/14/2012 12:42   Dg  Chest Port 1 View  11/28/2012   *RADIOLOGY REPORT*  Clinical Data: Postop cardiac surgery  PORTABLE CHEST - 1 VIEW  Comparison: 11/27/2012  Findings: Endotracheal tube removed.  Mediastinal drain removed. Swan-Ganz catheter removed.  Right jugular sheath in the SVC.  Left chest tube in place.  No pneumothorax.  Improvement in left lower lobe atelectasis.  No edema.  IMPRESSION: Improved aeration in the left lower lobe.  No pneumothorax or edema.   Original Report Authenticated By: Janeece Riggers, M.D.   Dg Chest Portable 1 View In Am  11/27/2012   CLINICAL DATA:  Postop from CABG.  EXAM: PORTABLE CHEST - 1 VIEW  COMPARISON:  11/25/2012  FINDINGS: Support lines and tubes in appropriate position. No pneumothorax identified. Mild increase in atelectasis noted in the left retrocardiac lung base. Right lung is clear. Heart size stable.  IMPRESSION: Mild increase in left basilar atelectasis. No pneumothorax visualized.   Electronically Signed   By: Myles Rosenthal   On: 11/27/2012 07:40   Dg Chest Portable 1 View  11/26/2012   *RADIOLOGY REPORT*  Clinical Data: Status post CABG  PORTABLE CHEST - 1 VIEW  Comparison: 11/25/2012  Findings: The endotracheal tube tip is above the carina.  There is a nasogastric tube with tip in the stomach.  Swan-Ganz catheter tip is in the right pulmonary artery.  There is a left-sided chest tube in place.  No pneumothorax is identified.  Atelectasis is noted within the left midlung and left base.  IMPRESSION:  1.  No complications status post CABG procedure. 2.  Mediastinal drain and left chest tube in place without significant pneumothorax.   Original Report Authenticated By: Signa Kell, M.D.    Microbiology: No results found for this or any previous visit (from the past 240 hour(s)).   Labs: Basic Metabolic Panel:  Recent Labs Lab 12/13/12 2040  NA 136  K 4.8  CL 98  CO2 26  GLUCOSE 125*  BUN 28*  CREATININE 1.18*  CALCIUM 9.2   Liver Function Tests:  Recent  Labs Lab 12/13/12 2040  AST 28  ALT 10  ALKPHOS 64  BILITOT  0.4  PROT 7.4  ALBUMIN 4.0   No results found for this basename: LIPASE, AMYLASE,  in the last 168 hours No results found for this basename: AMMONIA,  in the last 168 hours CBC:  Recent Labs Lab 12/13/12 2040  WBC 11.5*  NEUTROABS 6.5  HGB 12.4  HCT 37.3  MCV 81.3  PLT 449*   Cardiac Enzymes:  Recent Labs Lab 12/13/12 2040 12/13/12 2313 12/14/12 0012 12/14/12 0810 12/14/12 1312  TROPONINI <0.30 <0.30 <0.30 <0.30 <0.30   BNP: BNP (last 3 results) No results found for this basename: PROBNP,  in the last 8760 hours CBG:  Recent Labs Lab 12/14/12 0602 12/14/12 1108  GLUCAP 109* 117*       Signed:  Jernard Reiber  [facility] 12/14/2012, 2:42 PM

## 2012-12-14 NOTE — H&P (Signed)
[facility] History and Physical  Leah Levine XBJ:[id number] DOB: 04/12/1932 DOA: 12/13/2012  Referring physician: ED PCP: Rudi Heap, MD  Chief Complaint: Chest pain  HPI: Leah Levine is a 79 y.o. female s/p CABG done some 18 days ago for NSTEMI who presents with 2 episodes yesterday of 5 seconds of sharp left chest pain located under her left breast.  She also had a 10 min episode of chest pressure left sided today in the ED.  Troponins were negative x2 in the ED before the chest pressure episode.  ED called cardiology and they want the patient observed for serial troponins but do doubt that this is ACS.  Review of Systems: Hoarse voice which onset yesterday, 12 systems reviewed and otherwise negative.   Past Medical History  Diagnosis Date  . Tremor   . GERD (gastroesophageal reflux disease)   . Hyperlipidemia   . Allergy   . Urinary incontinence   . Diabetes mellitus type 2, controlled   . Hypertension     patient denies ever having hypertension  . Personal history of colonic polyps 11/20/2010    tubular adenomas  . Hypothyroidism   . Anemia   . Skin cancer     ear, right side of cheek   Past Surgical History  Procedure Laterality Date  . Appendectomy    . Cholecystectomy    . Abdominal hysterectomy    . Knee arthroscopy      bilateral  . Carpal tunnel release    . Back surgery    . Gallbladder surgery    . Knees Bilateral   . Coronary artery bypass graft N/A 11/26/2012    Procedure: CORONARY ARTERY BYPASS GRAFTING (CABG);  Surgeon: Delight Ovens, MD;  Location: [facility] OR;  Service: Open Heart Surgery;  Laterality: N/A;  . Intraoperative transesophageal echocardiogram N/A 11/26/2012    Procedure: INTRAOPERATIVE TRANSESOPHAGEAL ECHOCARDIOGRAM;  Surgeon: Delight Ovens, MD;  Location: [facility] OR;  Service: Open Heart Surgery;  Laterality: N/A;   Social History:  reports that she has never smoked. She has never used smokeless tobacco. She reports that she  does not drink alcohol or use illicit drugs.   Allergies  Allergen Reactions  . Celebrex [Celecoxib]     Muscle aches  . Crestor [Rosuvastatin Calcium]     Muscle aches  . Fenofibrate     Muscle aches  . Hydrocodone   . Lipitor [Atorvastatin Calcium]     Muscle aches  . Simvastatin     Muscle aches    Family History  Problem Relation Age of Onset  . Ovarian cancer Mother   . Diabetes Father   . Pneumonia Father   . Diabetes Sister     Prior to Admission medications   Medication Sig Start Date End Date Taking? Authorizing Provider  acetaminophen (TYLENOL) 325 MG tablet Take 2 tablets (650 mg total) by mouth every 6 (six) hours as needed for pain. 12/02/12  Yes [name] Margaretann Loveless, PA-C  aspirin EC 325 MG EC tablet Take 1 tablet (325 mg total) by mouth daily. 12/02/12  Yes [name] Margaretann Loveless, PA-C  atorvastatin (LIPITOR) 80 MG tablet Take 1 tablet (80 mg total) by mouth daily at 6 PM. 12/02/12  Yes [name] Margaretann Loveless, PA-C  Bismuth Subsalicylate (PEPTO-BISMOL PO) Take 1 tablet by mouth daily as needed (for indigestion).    Yes Historical Provider, MD  ezetimibe (ZETIA) 10 MG tablet Take 1 tablet (10 mg total) by mouth daily. 12/02/12  Yes [name]  Margaretann Loveless, PA-C  ferrous sulfate 325 (65 FE) MG tablet Take 1 tablet (325 mg total) by mouth daily with breakfast. For one month then stop 12/02/12  Yes [name] Margaretann Loveless, PA-C  folic acid (FOLVITE) 1 MG tablet Take 1 tablet (1 mg total) by mouth daily. For one month then stop. 12/02/12  Yes [name] Margaretann Loveless, PA-C  INVESTIGATIONAL DRUG SIMPLE RECORD Take 7.5 mg by mouth 2 (two) times daily. 12/02/12  Yes [name] Margaretann Loveless, PA-C  levothyroxine (SYNTHROID, LEVOTHROID) 100 MCG tablet Take 100 mcg by mouth daily.     Yes Historical Provider, MD  lisinopril (PRINIVIL,ZESTRIL) 5 MG tablet Take 1 tablet (5 mg total) by mouth daily. 12/02/12  Yes [name] Margaretann Loveless, PA-C  nystatin cream (MYCOSTATIN) Apply topically 2 (two) times daily.  09/15/12  Yes Mary-Margaret Daphine Deutscher, FNP  primidone (MYSOLINE) 50 MG tablet Take 50 mg by mouth 3 (three) times daily. 09/23/12  Yes Levert Feinstein, MD  propranolol (INDERAL) 10 MG tablet Take 10 mg by mouth 2 (two) times daily. 12/02/12  Yes [name] Margaretann Loveless, PA-C  senna (SENOKOT) 8.6 MG tablet Take 2 tablets by mouth at bedtime as needed for constipation.   Yes Historical Provider, MD  sitaGLIPtan-metformin (JANUMET) 50-500 MG per tablet Take 1 tablet by mouth 2 (two) times daily with a meal. 08/25/12  Yes Mary-Margaret Daphine Deutscher, FNP  traMADol (ULTRAM) 50 MG tablet Take 1 tablet (50 mg total) by mouth every 6 (six) hours as needed for pain. 12/02/12  Yes [name] Margaretann Loveless, PA-C   Physical Exam: Filed Vitals:   12/14/12 0015  BP: 108/48  Pulse: 72  Temp:   Resp: 25    General:  NAD, resting comfortably in bed Eyes: PEERLA EOMI ENT: mucous membranes moist Neck: supple w/o JVD, hoarse voice Cardiovascular: RRR w/o MRG Respiratory: CTA B Abdomen: soft, nt, nd, bs+ Skin: no rash nor lesion Musculoskeletal: MAE, full ROM all 4 extremities Psychiatric: normal tone and affect Neurologic: AAOx3, grossly non-focal  Labs on Admission:  Basic Metabolic Panel:  Recent Labs Lab 12/13/12 2040  NA 136  K 4.8  CL 98  CO2 26  GLUCOSE 125*  BUN 28*  CREATININE 1.18*  CALCIUM 9.2   Liver Function Tests:  Recent Labs Lab 12/13/12 2040  AST 28  ALT 10  ALKPHOS 64  BILITOT 0.4  PROT 7.4  ALBUMIN 4.0   No results found for this basename: LIPASE, AMYLASE,  in the last 168 hours No results found for this basename: AMMONIA,  in the last 168 hours CBC:  Recent Labs Lab 12/13/12 2040  WBC 11.5*  NEUTROABS 6.5  HGB 12.4  HCT 37.3  MCV 81.3  PLT 449*   Cardiac Enzymes:  Recent Labs Lab 12/13/12 2040 12/13/12 2313  TROPONINI <0.30 <0.30    BNP (last 3 results) No results found for this basename: PROBNP,  in the last 8760 hours CBG: No results found for this basename: GLUCAP,   in the last 168 hours  Radiological Exams on Admission: Dg Chest 2 View  12/13/2012   CLINICAL DATA:  Chest pain  EXAM: CHEST  2 VIEW  COMPARISON:  11/28/2012  FINDINGS: Mild cardiomegaly, chronic. Status post CABG. No acute change in mediastinal contours.  Small left pleural effusion.  Chronic centrally calcified pulmonary nodule in the peripheral left mid lung. No edema, infiltrate, or pneumothorax. No acute osseous findings.  IMPRESSION: 1. Small left pleural effusion. 2. Cardiomegaly without failure.   Electronically Signed   By: Christiane Ha  Watts   On: 12/13/2012 22:24    EKG: Independently reviewed.  Assessment/Plan Principal Problem:   Chest pain Active Problems:   Diabetes mellitus type 2, controlled   S/P CABG x 4   1. Chest pain - very atypical chest pain with neg trops and neg EKG, most likely not ACS, patient just had a CABG 18 days ago, most likely this is post-operative in nature.  As cardiology wishes it, will admit patient for serial troponins at this time, no plan to keep her NPO as unless her troponins elevate or her EKGs change, I doubt they plan stress test or heart cath this soon after CABG. 2. DM2 - holding home meds and putting patient on med dose SSI AC/HS    Code Status: Full Code (must indicate code status--if unknown or must be presumed, indicate so) Family Communication: Spoke with family at bedside (indicate person spoken with, if applicable, with phone number if by telephone) Disposition Plan: Admit to obs (indicate anticipated LOS)  Time spent: 50 min  GARDNER, JARED M. [facility] Pager [phone]  If 7PM-7AM, please contact night-coverage [email or web address] Password [email or web address] 12/14/2012, 1:02 AM

## 2012-12-14 NOTE — Consult Note (Signed)
Reason for Consult:chest pain after CABG  Referring Physician: Dr. Lesle Reek is an 79 y.o. female.   HPI: The patient is a 79 yo woman with a h/o CAD, s/p CABG 3 weeks ago. She developed chest pain yesterday and was taken to the ED for additional evaluation. She has ruled out for MI, but her D-Dimer was positive. Her chest pain is reproducible with palpation. She denies syncope. Minimal sob. No significant edema. No relation to position or activity or diet.  PMH: Past Medical History  Diagnosis Date  . Tremor   . GERD (gastroesophageal reflux disease)   . Hyperlipidemia   . Allergy   . Urinary incontinence   . Diabetes mellitus type 2, controlled   . Hypertension     patient denies ever having hypertension  . Personal history of colonic polyps 11/20/2010    tubular adenomas  . Hypothyroidism   . Anemia   . Skin cancer     ear, right side of cheek    PSHX: Past Surgical History  Procedure Laterality Date  . Appendectomy    . Cholecystectomy    . Abdominal hysterectomy    . Knee arthroscopy      bilateral  . Carpal tunnel release    . Back surgery    . Gallbladder surgery    . Knees Bilateral   . Coronary artery bypass graft N/A 11/26/2012    Procedure: CORONARY ARTERY BYPASS GRAFTING (CABG);  Surgeon: Delight Ovens, MD;  Location: [facility] OR;  Service: Open Heart Surgery;  Laterality: N/A;  . Intraoperative transesophageal echocardiogram N/A 11/26/2012    Procedure: INTRAOPERATIVE TRANSESOPHAGEAL ECHOCARDIOGRAM;  Surgeon: Delight Ovens, MD;  Location: [facility] OR;  Service: Open Heart Surgery;  Laterality: N/A;    FAMHX: Family History  Problem Relation Age of Onset  . Ovarian cancer Mother   . Diabetes Father   . Pneumonia Father   . Diabetes Sister     Social History:  reports that she has never smoked. She has never used smokeless tobacco. She reports that she does not drink alcohol or use illicit drugs.  Allergies:  Allergies  Allergen Reactions   . Celebrex [Celecoxib]     Muscle aches  . Crestor [Rosuvastatin Calcium]     Muscle aches  . Fenofibrate     Muscle aches  . Hydrocodone   . Lipitor [Atorvastatin Calcium]     Muscle aches  . Simvastatin     Muscle aches    Medications: reviewed  Dg Chest 2 View  12/13/2012   CLINICAL DATA:  Chest pain  EXAM: CHEST  2 VIEW  COMPARISON:  11/28/2012  FINDINGS: Mild cardiomegaly, chronic. Status post CABG. No acute change in mediastinal contours.  Small left pleural effusion.  Chronic centrally calcified pulmonary nodule in the peripheral left mid lung. No edema, infiltrate, or pneumothorax. No acute osseous findings.  IMPRESSION: 1. Small left pleural effusion. 2. Cardiomegaly without failure.   Electronically Signed   By: Tiburcio Pea   On: 12/13/2012 22:24    ROS  As stated in the HPI and negative for all other systems.  Physical Exam  Vitals:Blood pressure 109/66, pulse 68, temperature 97.9 F (36.6 C), temperature source Oral, resp. rate 20, height 4\' 11"  (1.499 m), weight 179 lb 8 oz (81.421 kg), SpO2 93.00%.  Well appearing NAD HEENT: Unremarkable Neck:  No JVD, no thyromegally Lymphatics:  No adenopathy Back:  No CVA tenderness Lungs:  Clear with no wheezes, tender to  palpation on the left upper anterior chest HEART:  Regular rate rhythm, no murmurs, no rubs, no clicks Abd:  obese, positive bowel sounds, no organomegally, no rebound, no guarding Ext:  2 plus pulses, no edema, no cyanosis, no clubbing Skin:  No rashes no nodules Neuro:  CN II through XII intact, motor grossly intact  Assessment/Plan: 1. Non-cardiac chest pain 2. CAD, s/p recent CABG 3. HTN 4. Dyslipidemia Rec: As D-dimer is elevated, would work up pulmonary embolism though clinically this seems unlikely. Do not think her chest pain warrants further cardiac evaluation. She has renal insufficiency, but would be ok with a single dose of IV Toradol. Ok for early discharge if no Pulmonary  embolism.  Sharlot Gowda TaylorMD 12/14/2012, 10:17 AM

## 2012-12-14 NOTE — Progress Notes (Signed)
Assessment unchanged. Discussed D/C instructions with pt including holding Janumet for 48 hours. Pt verbalized understanding. IV and tele removed. Pt left via W/C accompanied by NT.

## 2012-12-14 NOTE — Progress Notes (Signed)
Utilization Review completed.  

## 2012-12-14 NOTE — ED Provider Notes (Signed)
CSN: [id number]     Arrival date & time 12/13/12  2033 History   First MD Initiated Contact with Patient 12/13/12 2052     Chief Complaint  Patient presents with  . Chest Pain   (Consider location/radiation/quality/duration/timing/severity/associated sxs/prior Treatment) HPI Comments: 79 yo female with CABG 3 wks prior presents with cp.  Pt had two brief episodes of sharp left cp lasting seconds yesterday, no other new sxs since surgery except hoarse voice after intubation.  Pt developed left chest pressure/ with shoulder radiation in the ED lasting 10 minutes.  No sob or leg swelling.  Improves with time.  Different than previous.  Patient is a 79 y.o. female presenting with chest pain. The history is provided by the patient.  Chest Pain Pain location:  L chest Associated symptoms: no abdominal pain, no back pain, no fever, no headache, no shortness of breath and not vomiting     Past Medical History  Diagnosis Date  . Tremor   . GERD (gastroesophageal reflux disease)   . Hyperlipidemia   . Allergy   . Urinary incontinence   . Diabetes mellitus type 2, controlled   . Hypertension     patient denies ever having hypertension  . Personal history of colonic polyps 11/20/2010    tubular adenomas  . Hypothyroidism   . Anemia   . Skin cancer     ear, right side of cheek   Past Surgical History  Procedure Laterality Date  . Appendectomy    . Cholecystectomy    . Abdominal hysterectomy    . Knee arthroscopy      bilateral  . Carpal tunnel release    . Back surgery    . Gallbladder surgery    . Knees Bilateral   . Coronary artery bypass graft N/A 11/26/2012    Procedure: CORONARY ARTERY BYPASS GRAFTING (CABG);  Surgeon: Delight Ovens, MD;  Location: [facility] OR;  Service: Open Heart Surgery;  Laterality: N/A;  . Intraoperative transesophageal echocardiogram N/A 11/26/2012    Procedure: INTRAOPERATIVE TRANSESOPHAGEAL ECHOCARDIOGRAM;  Surgeon: Delight Ovens, MD;  Location: [facility] OR;   Service: Open Heart Surgery;  Laterality: N/A;   Family History  Problem Relation Age of Onset  . Ovarian cancer Mother   . Diabetes Father   . Pneumonia Father   . Diabetes Sister    History  Substance Use Topics  . Smoking status: Never Smoker   . Smokeless tobacco: Never Used  . Alcohol Use: No   OB History   Grav Para Term Preterm Abortions TAB SAB Ect Mult Living                 Review of Systems  Constitutional: Negative for fever and chills.  HENT: Positive for sore throat. Negative for neck pain and neck stiffness.   Eyes: Negative for visual disturbance.  Respiratory: Negative for shortness of breath.   Cardiovascular: Positive for chest pain.  Gastrointestinal: Negative for vomiting and abdominal pain.  Genitourinary: Negative for dysuria and flank pain.  Musculoskeletal: Negative for back pain.  Skin: Negative for rash.  Neurological: Negative for light-headedness and headaches.    Allergies  Celebrex; Crestor; Fenofibrate; Hydrocodone; Lipitor; and Simvastatin  Home Medications   Current Outpatient Rx  Name  Route  Sig  Dispense  Refill  . acetaminophen (TYLENOL) 325 MG tablet   Oral   Take 2 tablets (650 mg total) by mouth every 6 (six) hours as needed for pain.         Marland Kitchen  aspirin EC 325 MG EC tablet   Oral   Take 1 tablet (325 mg total) by mouth daily.   30 tablet   0   . atorvastatin (LIPITOR) 80 MG tablet   Oral   Take 1 tablet (80 mg total) by mouth daily at 6 PM.   30 tablet   1   . Bismuth Subsalicylate (PEPTO-BISMOL PO)   Oral   Take 1 tablet by mouth daily as needed (for indigestion).          . ezetimibe (ZETIA) 10 MG tablet   Oral   Take 1 tablet (10 mg total) by mouth daily.   30 tablet   1   . ferrous sulfate 325 (65 FE) MG tablet   Oral   Take 1 tablet (325 mg total) by mouth daily with breakfast. For one month then stop   30 tablet   0   . folic acid (FOLVITE) 1 MG tablet   Oral   Take 1 tablet (1 mg total) by  mouth daily. For one month then stop.         . INVESTIGATIONAL DRUG SIMPLE RECORD   Oral   Take 7.5 mg by mouth 2 (two) times daily.         Marland Kitchen levothyroxine (SYNTHROID, LEVOTHROID) 100 MCG tablet   Oral   Take 100 mcg by mouth daily.           Marland Kitchen lisinopril (PRINIVIL,ZESTRIL) 5 MG tablet   Oral   Take 1 tablet (5 mg total) by mouth daily.   30 tablet   1   . nystatin cream (MYCOSTATIN)   Topical   Apply topically 2 (two) times daily.   30 g   5   . primidone (MYSOLINE) 50 MG tablet   Oral   Take 50 mg by mouth 3 (three) times daily.         . propranolol (INDERAL) 10 MG tablet   Oral   Take 10 mg by mouth 2 (two) times daily.         Marland Kitchen senna (SENOKOT) 8.6 MG tablet   Oral   Take 2 tablets by mouth at bedtime as needed for constipation.         . sitaGLIPtan-metformin (JANUMET) 50-500 MG per tablet   Oral   Take 1 tablet by mouth 2 (two) times daily with a meal.   180 tablet   0     NTBS   . traMADol (ULTRAM) 50 MG tablet   Oral   Take 1 tablet (50 mg total) by mouth every 6 (six) hours as needed for pain.   40 tablet   0    BP 125/69  Pulse 69  Temp(Src) 98.1 F (36.7 C) (Oral)  Resp 17  SpO2 96% Physical Exam  Nursing note and vitals reviewed. Constitutional: She is oriented to person, place, and time. She appears well-developed and well-nourished.  HENT:  Head: Normocephalic and atraumatic.  Eyes: Conjunctivae are normal. Right eye exhibits no discharge. Left eye exhibits no discharge.  Neck: Normal range of motion. Neck supple. No tracheal deviation present.  Cardiovascular: Normal rate and regular rhythm.   Pulmonary/Chest: Effort normal and breath sounds normal.  Abdominal: Soft. She exhibits no distension. There is no tenderness. There is no guarding.  Musculoskeletal: She exhibits tenderness. She exhibits no edema.  Sternotomy incision clear, no signs of infection  Neurological: She is alert and oriented to person, place, and time.   Skin: Skin is  warm. No rash noted.  Psychiatric: She has a normal mood and affect.    ED Course  Procedures (including critical care time) Labs Review Labs Reviewed  CBC WITH DIFFERENTIAL - Abnormal; Notable for the following:    WBC 11.5 (*)    RDW 16.0 (*)    Platelets 449 (*)    Eosinophils Relative 13 (*)    Eosinophils Absolute 1.5 (*)    All other components within normal limits  COMPREHENSIVE METABOLIC PANEL - Abnormal; Notable for the following:    Glucose, Bld 125 (*)    BUN 28 (*)    Creatinine, Ser 1.18 (*)    GFR calc non Af Amer 43 (*)    GFR calc Af Amer 50 (*)    All other components within normal limits  TROPONIN I  TROPONIN I  TROPONIN I  TROPONIN I  TROPONIN I   Imaging Review Dg Chest 2 View  12/13/2012   CLINICAL DATA:  Chest pain  EXAM: CHEST  2 VIEW  COMPARISON:  11/28/2012  FINDINGS: Mild cardiomegaly, chronic. Status post CABG. No acute change in mediastinal contours.  Small left pleural effusion.  Chronic centrally calcified pulmonary nodule in the peripheral left mid lung. No edema, infiltrate, or pneumothorax. No acute osseous findings.  IMPRESSION: 1. Small left pleural effusion. 2. Cardiomegaly without failure.   Electronically Signed   By: Tiburcio Pea   On: 12/13/2012 22:24    MDM  No diagnosis found. Atypical cp yesterday. In ED more concerning CP.  EKGs reviewed, no acute findings. Pt had asp today.  Mild dehydration, ARF, fluids given. EKG and cxr reviewed.  Rechecked, pain resolved.  Updated pt.  Spoke with cardiology and hospitalist, observation tele.   Date: 12/14/2012  Rate: 75  Rhythm: normal sinus rhythm  QRS Axis: indeterminate  Intervals: QT prolonged  ST/T Wave abnormalities: nonspecific ST changes  Conduction Disutrbances:none  Narrative Interpretation:   Old EKG Reviewed: unchanged   Date: 12/14/2012  Rate: 68  Rhythm: normal sinus rhythm  QRS Axis: indeterminate  Intervals: QT prolonged  ST/T Wave  abnormalities: nonspecific ST changes  Conduction Disutrbances:none  Narrative Interpretation:   Old EKG Reviewed: unchanged  Obs tele.     Enid Skeens, MD 12/14/12 [phone]

## 2012-12-22 ENCOUNTER — Other Ambulatory Visit: Payer: Self-pay | Admitting: *Deleted

## 2012-12-22 DIAGNOSIS — I2510 Atherosclerotic heart disease of native coronary artery without angina pectoris: Secondary | ICD-10-CM

## 2012-12-24 ENCOUNTER — Ambulatory Visit: Payer: Federal, State, Local not specified - PPO | Admitting: Nurse Practitioner

## 2012-12-25 ENCOUNTER — Ambulatory Visit
Admission: RE | Admit: 2012-12-25 | Discharge: 2012-12-25 | Disposition: A | Discharge status: Home / Self Care | Payer: Federal, State, Local not specified - PPO | Source: Ambulatory Visit | Attending: Cardiothoracic Surgery | Admitting: Cardiothoracic Surgery

## 2012-12-25 ENCOUNTER — Encounter: Payer: Self-pay | Admitting: Cardiothoracic Surgery

## 2012-12-25 ENCOUNTER — Ambulatory Visit (INDEPENDENT_AMBULATORY_CARE_PROVIDER_SITE_OTHER): Payer: Self-pay | Admitting: Cardiothoracic Surgery

## 2012-12-25 VITALS — BP 104/75 | HR 60 | Resp 20 | Ht 59.0 in | Wt 171.0 lb

## 2012-12-25 DIAGNOSIS — I2510 Atherosclerotic heart disease of native coronary artery without angina pectoris: Secondary | ICD-10-CM

## 2012-12-25 DIAGNOSIS — Z951 Presence of aortocoronary bypass graft: Secondary | ICD-10-CM

## 2012-12-25 MED ORDER — TRAMADOL HCL 50 MG PO TABS: 50.0000 mg | ORAL_TABLET | Freq: Four times a day (QID) | ORAL | Status: DC | PRN

## 2012-12-25 NOTE — Progress Notes (Signed)
[address]       [place] [address]             [phone]                  Leah Levine [facility] Medical Record #[id number] Date of Birth: 1932-06-20  [name], MD Rudi Heap, MD  Chief Complaint:   PostOp Follow Up Visit 11/26/2012  PREOPERATIVE DIAGNOSIS: Coronary occlusive disease with recent non-ST  elevation myocardial infarction.  POSTOPERATIVE DIAGNOSIS: Coronary occlusive disease with recent non-ST  elevation myocardial infarction.  SURGICAL PROCEDURE: Coronary artery bypass grafting x4 with the left  internal mammary to the left anterior descending coronary artery,  reverse saphenous vein graft to the diagonal coronary artery, reverse  saphenous vein graft to the distal circumflex coronary artery, reverse  saphenous vein graft to the distal right coronary artery with right  thigh and calf endo vein harvesting.  SURGEON: Sheliah Plane, MD  History of Present Illness:     Doing well postop. No recurrent chest pain or evidence of CHF. She is increasing activity level well. No fever or chills     History  Smoking status  . Never Smoker   Smokeless tobacco  . Never Used       Allergies  Allergen Reactions  . Celebrex [Celecoxib]     Muscle aches  . Crestor [Rosuvastatin Calcium]     Muscle aches  . Fenofibrate     Muscle aches  . Hydrocodone   . Lipitor [Atorvastatin Calcium]     Muscle aches  . Simvastatin     Muscle aches    Current Outpatient Prescriptions  Medication Sig Dispense Refill  . acetaminophen (TYLENOL) 325 MG tablet Take 2 tablets (650 mg total) by mouth every 6 (six) hours as needed for pain.      Marland Kitchen aspirin EC 325 MG EC tablet Take 1 tablet (325 mg total) by mouth daily.  30 tablet  0  . atorvastatin (LIPITOR) 80 MG tablet Take 1 tablet (80 mg total) by mouth daily at 6 PM.  30 tablet  1  . Bismuth Subsalicylate (PEPTO-BISMOL PO) Take 1 tablet by mouth daily as needed (for indigestion).        . ezetimibe (ZETIA) 10 MG tablet Take 1 tablet (10 mg total) by mouth daily.  30 tablet  1  . ferrous sulfate 325 (65 FE) MG tablet Take 1 tablet (325 mg total) by mouth daily with breakfast. For one month then stop  30 tablet  0  . folic acid (FOLVITE) 1 MG tablet Take 1 tablet (1 mg total) by mouth daily. For one month then stop.      . INVESTIGATIONAL DRUG SIMPLE RECORD Take 7.5 mg by mouth 2 (two) times daily.      Marland Kitchen levothyroxine (SYNTHROID, LEVOTHROID) 100 MCG tablet Take 100 mcg by mouth daily.        Marland Kitchen lisinopril (PRINIVIL,ZESTRIL) 5 MG tablet Take 1 tablet (5 mg total) by mouth daily.  30 tablet  1  . nystatin cream (MYCOSTATIN) Apply topically 2 (two) times daily.  30 g  5  . primidone (MYSOLINE) 50 MG tablet Take 50 mg by mouth 3 (three) times daily.      . propranolol (INDERAL) 10 MG tablet Take 10 mg by mouth 2 (two) times daily.      Marland Kitchen senna (SENOKOT) 8.6 MG tablet Take 2 tablets by mouth at bedtime as needed for  constipation.      . traMADol (ULTRAM) 50 MG tablet Take 1 tablet (50 mg total) by mouth every 6 (six) hours as needed for pain.  40 tablet  0   No current facility-administered medications for this visit.       Physical Exam: BP 104/75  Pulse 60  Resp 20  Ht 4\' 11"  (1.499 m)  Wt 171 lb (77.565 kg)  BMI 34.52 kg/m2  SpO2 97%  General appearance: alert and cooperative Neurologic: intact Heart: regular rate and rhythm, S1, S2 normal, no murmur, click, rub or gallop Lungs: clear to auscultation bilaterally Abdomen: soft, non-tender; bowel sounds normal; no masses,  no organomegaly Extremities: extremities normal, atraumatic, no cyanosis or edema and Homans sign is negative, no sign of DVT Wound: sternum stable and healing well  Diagnostic Studies & Laboratory data:         Recent Radiology Findings: Dg Chest 2 View  12/25/2012   CLINICAL DATA:  Heart surgery 4 weeks ago. Short of breath.  EXAM: CHEST  2 VIEW  COMPARISON:  Chest radiograph and chest CT,  11/2012 and 12/14/2012 respectively  FINDINGS: Cardiac silhouette is mildly enlarged. Changes from CABG surgery are stable. The mediastinum is normal in contour. There are no hilar masses.  Stable granuloma in the left upper lobe. The lungs are otherwise clear. No pleural effusion or pneumothorax.  The bony thorax is demineralized but intact.  IMPRESSION: No acute cardiopulmonary disease.   Electronically Signed   By: Amie Portland   On: 12/25/2012 16:33      Recent Labs: Lab Results  Component Value Date   WBC 11.5* 12/13/2012   HGB 12.4 12/13/2012   HCT 37.3 12/13/2012   PLT 449* 12/13/2012   GLUCOSE 125* 12/13/2012   CHOL 161 11/26/2012   TRIG 333* 11/26/2012   HDL 23* 11/26/2012   LDLCALC 71 11/26/2012   ALT 10 12/13/2012   AST 28 12/13/2012   NA 136 12/13/2012   K 4.8 12/13/2012   CL 98 12/13/2012   CREATININE 1.18* 12/13/2012   BUN 28* 12/13/2012   CO2 26 12/13/2012   TSH 2.000 09/03/2012   INR 1.40 11/26/2012   HGBA1C 6.3* 07/03/2010      Assessment / Plan:   Stable post op CABG course  Follow up with cardiology in [facility] 12/25/2012 4:48 PM

## 2012-12-26 ENCOUNTER — Ambulatory Visit: Payer: Federal, State, Local not specified - PPO | Admitting: Nurse Practitioner

## 2012-12-29 ENCOUNTER — Telehealth: Payer: Self-pay

## 2012-12-29 ENCOUNTER — Encounter (HOSPITAL_COMMUNITY): Payer: Self-pay | Admitting: Emergency Medicine

## 2012-12-29 ENCOUNTER — Emergency Department (HOSPITAL_COMMUNITY)
Admission: EM | Admit: 2012-12-29 | Discharge: 2012-12-29 | Disposition: A | Discharge status: Home / Self Care | Payer: Federal, State, Local not specified - PPO | Attending: Emergency Medicine | Admitting: Emergency Medicine

## 2012-12-29 ENCOUNTER — Emergency Department (HOSPITAL_COMMUNITY): Payer: Federal, State, Local not specified - PPO

## 2012-12-29 DIAGNOSIS — E119 Type 2 diabetes mellitus without complications: Secondary | ICD-10-CM | POA: Insufficient documentation

## 2012-12-29 DIAGNOSIS — Z85828 Personal history of other malignant neoplasm of skin: Secondary | ICD-10-CM | POA: Insufficient documentation

## 2012-12-29 DIAGNOSIS — R110 Nausea: Secondary | ICD-10-CM | POA: Insufficient documentation

## 2012-12-29 DIAGNOSIS — R1084 Generalized abdominal pain: Secondary | ICD-10-CM | POA: Insufficient documentation

## 2012-12-29 DIAGNOSIS — Z7982 Long term (current) use of aspirin: Secondary | ICD-10-CM | POA: Insufficient documentation

## 2012-12-29 DIAGNOSIS — Z951 Presence of aortocoronary bypass graft: Secondary | ICD-10-CM | POA: Insufficient documentation

## 2012-12-29 DIAGNOSIS — I10 Essential (primary) hypertension: Secondary | ICD-10-CM | POA: Insufficient documentation

## 2012-12-29 DIAGNOSIS — Z8522 Personal history of malignant neoplasm of nasal cavities, middle ear, and accessory sinuses: Secondary | ICD-10-CM | POA: Insufficient documentation

## 2012-12-29 DIAGNOSIS — R05 Cough: Secondary | ICD-10-CM | POA: Insufficient documentation

## 2012-12-29 DIAGNOSIS — Z860100 Personal history of colon polyps, unspecified: Secondary | ICD-10-CM | POA: Insufficient documentation

## 2012-12-29 DIAGNOSIS — D649 Anemia, unspecified: Secondary | ICD-10-CM | POA: Insufficient documentation

## 2012-12-29 DIAGNOSIS — E039 Hypothyroidism, unspecified: Secondary | ICD-10-CM | POA: Insufficient documentation

## 2012-12-29 DIAGNOSIS — R059 Cough, unspecified: Secondary | ICD-10-CM | POA: Insufficient documentation

## 2012-12-29 DIAGNOSIS — K5900 Constipation, unspecified: Secondary | ICD-10-CM | POA: Insufficient documentation

## 2012-12-29 DIAGNOSIS — Z79899 Other long term (current) drug therapy: Secondary | ICD-10-CM | POA: Insufficient documentation

## 2012-12-29 DIAGNOSIS — E785 Hyperlipidemia, unspecified: Secondary | ICD-10-CM | POA: Insufficient documentation

## 2012-12-29 DIAGNOSIS — R071 Chest pain on breathing: Secondary | ICD-10-CM | POA: Insufficient documentation

## 2012-12-29 DIAGNOSIS — R0789 Other chest pain: Secondary | ICD-10-CM

## 2012-12-29 LAB — BASIC METABOLIC PANEL
BUN: 17 mg/dL (ref 6–23)
CO2: 26 mEq/L (ref 19–32)
Calcium: 9.5 mg/dL (ref 8.4–10.5)
Creatinine, Ser: 0.89 mg/dL (ref 0.50–1.10)
GFR calc Af Amer: 70 mL/min — ABNORMAL LOW (ref >90)

## 2012-12-29 LAB — CBC
HCT: 39.1 % (ref 36.0–46.0)
MCH: 26.4 pg (ref 26.0–34.0)
MCHC: 32.2 g/dL (ref 30.0–36.0)
MCV: 82 fL (ref 78.0–100.0)
Platelets: 272 10*3/uL (ref 150–400)
RBC: 4.77 MIL/uL (ref 3.87–5.11)
RDW: 15.7 % — ABNORMAL HIGH (ref 11.5–15.5)

## 2012-12-29 LAB — POCT I-STAT TROPONIN I: Troponin i, poc: 0 ng/mL (ref 0.00–0.08)

## 2012-12-29 MED ORDER — OXYCODONE-ACETAMINOPHEN 5-325 MG PO TABS: 1.0000 | ORAL_TABLET | ORAL | Status: DC | PRN

## 2012-12-29 MED ORDER — OXYCODONE-ACETAMINOPHEN 5-325 MG PO TABS
1.0000 | ORAL_TABLET | Freq: Once | ORAL | Status: AC
  Administered 2012-12-29: 1 via ORAL
  Filled 2012-12-29: qty 1

## 2012-12-29 NOTE — Telephone Encounter (Signed)
Mrs Algeo called this AM C/O chest pain/ pressure on the left side that started last night and has been increasing this am. She has taken her pain medication with no relief. She states that she does not feel short of breath.  I advised her to go the [facility] ED now. Her Daughter will take her.

## 2012-12-29 NOTE — ED Notes (Signed)
Family at bedside. 

## 2012-12-29 NOTE — ED Provider Notes (Signed)
CSN: [id number]     Arrival date & time 12/29/12  1240 History   First MD Initiated Contact with Patient 12/29/12 1317     Chief Complaint  Patient presents with  . Chest Pain   (Consider location/radiation/quality/duration/timing/severity/associated sxs/prior Treatment) The history is provided by the patient, a relative and medical records.  Pt with hx CABG 9/2, admitted 9/20-9/21 for chest pain, normal postop visit 10/2 with Dr Tyrone Sage cardiothoracic surgery. She is supposed to establish f/u with a cardiologist in [place], to be arranged by Dr Tyrone Sage.  Patient presents with persistent left sided chest pain.  States the pain has been constant, waxing and waning with occasional sharp pain x 4 days.  States she has actually had the chest pain since her hospitalization 9/20 and that this pain never went away.  Pain is exacerbated with palpation. Took tramadol once yesterday and 2 tylenol last night without improvement.  Has mild nonproductive cough.  Has had nausea and constipation for the past 4 days as well.  Denies fever, chills, vomiting, SOB.     Past Medical History  Diagnosis Date  . Tremor   . GERD (gastroesophageal reflux disease)   . Hyperlipidemia   . Allergy   . Urinary incontinence   . Diabetes mellitus type 2, controlled   . Hypertension     patient denies ever having hypertension  . Personal history of colonic polyps 11/20/2010    tubular adenomas  . Hypothyroidism   . Anemia   . Skin cancer     ear, right side of cheek   Past Surgical History  Procedure Laterality Date  . Appendectomy    . Cholecystectomy    . Abdominal hysterectomy    . Knee arthroscopy      bilateral  . Carpal tunnel release    . Back surgery    . Gallbladder surgery    . Knees Bilateral   . Coronary artery bypass graft N/A 11/26/2012    Procedure: CORONARY ARTERY BYPASS GRAFTING (CABG);  Surgeon: Delight Ovens, MD;  Location: [facility] OR;  Service: Open Heart Surgery;  Laterality: N/A;  .  Intraoperative transesophageal echocardiogram N/A 11/26/2012    Procedure: INTRAOPERATIVE TRANSESOPHAGEAL ECHOCARDIOGRAM;  Surgeon: Delight Ovens, MD;  Location: [facility] OR;  Service: Open Heart Surgery;  Laterality: N/A;   Family History  Problem Relation Age of Onset  . Ovarian cancer Mother   . Diabetes Father   . Pneumonia Father   . Diabetes Sister    History  Substance Use Topics  . Smoking status: Never Smoker   . Smokeless tobacco: Never Used  . Alcohol Use: No   OB History   Grav Para Term Preterm Abortions TAB SAB Ect Mult Living                 Review of Systems  Constitutional: Negative for fever and chills.  Respiratory: Positive for cough. Negative for shortness of breath.   Cardiovascular: Positive for chest pain.  Gastrointestinal: Positive for nausea, abdominal pain (mild discomfort, generalized) and constipation. Negative for vomiting and diarrhea.  Musculoskeletal: Negative for myalgias.  Skin: Positive for wound (surgical incision, healing well). Negative for color change.  Neurological: Negative for dizziness and light-headedness.  Psychiatric/Behavioral: Positive for sleep disturbance.    Allergies  Celebrex; Crestor; Fenofibrate; Hydrocodone; Lipitor; and Simvastatin  Home Medications   Current Outpatient Rx  Name  Route  Sig  Dispense  Refill  . acetaminophen (TYLENOL) 325 MG tablet   Oral  Take 2 tablets (650 mg total) by mouth every 6 (six) hours as needed for pain.         Marland Kitchen aspirin EC 325 MG EC tablet   Oral   Take 1 tablet (325 mg total) by mouth daily.   30 tablet   0   . atorvastatin (LIPITOR) 80 MG tablet   Oral   Take 1 tablet (80 mg total) by mouth daily at 6 PM.   30 tablet   1   . Bismuth Subsalicylate (PEPTO-BISMOL PO)   Oral   Take 1 tablet by mouth daily as needed (for indigestion).          . ezetimibe (ZETIA) 10 MG tablet   Oral   Take 1 tablet (10 mg total) by mouth daily.   30 tablet   1   . ferrous sulfate  325 (65 FE) MG tablet   Oral   Take 1 tablet (325 mg total) by mouth daily with breakfast. For one month then stop   30 tablet   0   . folic acid (FOLVITE) 1 MG tablet   Oral   Take 1 tablet (1 mg total) by mouth daily. For one month then stop.         . INVESTIGATIONAL DRUG SIMPLE RECORD   Oral   Take 7.5 mg by mouth 2 (two) times daily.         Marland Kitchen levothyroxine (SYNTHROID, LEVOTHROID) 100 MCG tablet   Oral   Take 100 mcg by mouth daily.           Marland Kitchen lisinopril (PRINIVIL,ZESTRIL) 5 MG tablet   Oral   Take 1 tablet (5 mg total) by mouth daily.   30 tablet   1   . nystatin cream (MYCOSTATIN)   Topical   Apply topically 2 (two) times daily.   30 g   5   . primidone (MYSOLINE) 50 MG tablet   Oral   Take 50 mg by mouth 3 (three) times daily.         . propranolol (INDERAL) 10 MG tablet   Oral   Take 10 mg by mouth 2 (two) times daily.         Marland Kitchen senna (SENOKOT) 8.6 MG tablet   Oral   Take 2 tablets by mouth at bedtime as needed for constipation.         . traMADol (ULTRAM) 50 MG tablet   Oral   Take 1 tablet (50 mg total) by mouth every 6 (six) hours as needed for pain.   40 tablet   0    BP 116/58  Pulse 74  Temp(Src) 98.2 F (36.8 C) (Oral)  Resp 20  Ht 5\' 2"  (1.575 m)  Wt 173 lb 8 oz (78.699 kg)  BMI 31.73 kg/m2  SpO2 95% Physical Exam  Nursing note and vitals reviewed. Constitutional: She appears well-developed and well-nourished. No distress.  HENT:  Head: Normocephalic and atraumatic.  Neck: Neck supple.  Cardiovascular: Normal rate, regular rhythm and intact distal pulses.   Pulmonary/Chest: Effort normal and breath sounds normal. No respiratory distress. She has no wheezes. She has no rales. She exhibits tenderness.  Palpation of chest wall reproduces pain.  Surgical incision is intact, healing well.  No erythema, edema, warmth, or discharge.   Abdominal: Soft. She exhibits no distension. There is no tenderness. There is no rebound and  no guarding.  Musculoskeletal: She exhibits no edema.  Neurological: She is alert.  Skin: She is  not diaphoretic.    ED Course  Procedures (including critical care time) Labs Review Labs Reviewed  CBC - Abnormal; Notable for the following:    RDW 15.7 (*)    All other components within normal limits  BASIC METABOLIC PANEL - Abnormal; Notable for the following:    Glucose, Bld 171 (*)    GFR calc non Af Amer 60 (*)    GFR calc Af Amer 70 (*)    All other components within normal limits  POCT I-STAT TROPONIN I   Imaging Review Dg Chest 2 View  12/29/2012   CLINICAL DATA:  Chest pain  EXAM: CHEST  2 VIEW  COMPARISON:  12/25/2012  FINDINGS: Cardiomediastinal silhouette is stable. Again noted status post CABG. Postcholecystectomy surgical clips in right upper abdomen. No acute infiltrate or pleural effusion. No pulmonary edema. Degenerative changes thoracic spine again noted. Stable calcified granuloma left upper lobe.  IMPRESSION: No active cardiopulmonary disease. No significant change. Status post CABG.   Electronically Signed   By: Natasha Mead M.D.   On: 12/29/2012 13:49    1:24 PM Pt not yet in exam room.    Date: 12/29/2012  Rate: 70  Rhythm: normal sinus rhythm  QRS Axis: normal  Intervals: normal  ST/T Wave abnormalities: normal  Conduction Disutrbances:none  Narrative Interpretation: low voltage, Q waves inferiorly  Old EKG Reviewed: unchanged   2:13 PM Dr Denton Lank made aware of the patient.   Discussed pt and plan with Dr Denton Lank.  MDM   1. Chest wall pain    Pt 1 month s/p CABG with normal postoperative healing, p/w persistent chest wall tenderness that is reproducible to palpation.  Pain has been constant for 4 days.  Troponin negative.  Pt;s pain is uncontrolled with ultram and tylenol.  Pt denies allergy to hydrocodone, states she took it for a long time after a knee surgery and that it might have made the allergy list because she had a hard time getting off the  medication.  She and her sister agree that she needs better pain control.  Percocet given here.  I advised pt to stay ahead of the pain but to use the pain medication only as needed.  Pt advised of the risks of pain medication usage and to be careful as it may increase risk of falls, etc.  Pt d/c home with percocet.  PCP follow up.  Also cardiology follow up.  Discussed result, findings, treatment, and follow up  with patient.  Pt given return precautions.  Pt verbalizes understanding and agrees with plan.      I doubt any other EMC precluding discharge at this time including, but not necessarily limited to the following: ACS, PE, postoperative complication      Trixie Dredge, PA-C 12/29/12 1532

## 2012-12-29 NOTE — ED Notes (Signed)
Pt c/o constant left sided CP x 4 days; pt sts had CABG on 9/2; pt denies SOB; pt sts worse with palpation

## 2012-12-30 DIAGNOSIS — Z0279 Encounter for issue of other medical certificate: Secondary | ICD-10-CM

## 2012-12-30 NOTE — ED Provider Notes (Signed)
Medical screening examination/treatment/procedure(s) were conducted as a shared visit with non-physician practitioner(s) and myself.  I personally evaluated the patient during the encounter Pt c/o left lateral cp since cabg surgery 1 month ago.  Subsequent hospital eval for same 9/20, at which point ct chest neg acute, and cardiology eval felt non cardiac cp.  Chest cta. +chest wall tenderness. Incision healing without sign of infection. Labs. Cxr.   Suzi Roots, MD 12/30/12 1336

## 2013-01-05 ENCOUNTER — Encounter (INDEPENDENT_AMBULATORY_CARE_PROVIDER_SITE_OTHER): Payer: Self-pay

## 2013-01-05 ENCOUNTER — Ambulatory Visit (INDEPENDENT_AMBULATORY_CARE_PROVIDER_SITE_OTHER): Payer: Federal, State, Local not specified - PPO | Admitting: Family Medicine

## 2013-01-05 ENCOUNTER — Ambulatory Visit (INDEPENDENT_AMBULATORY_CARE_PROVIDER_SITE_OTHER): Payer: Federal, State, Local not specified - PPO

## 2013-01-05 ENCOUNTER — Encounter: Payer: Self-pay | Admitting: Family Medicine

## 2013-01-05 VITALS — BP 112/67 | HR 72 | Temp 98.7°F | Wt 172.0 lb

## 2013-01-05 DIAGNOSIS — R05 Cough: Secondary | ICD-10-CM

## 2013-01-05 DIAGNOSIS — R079 Chest pain, unspecified: Secondary | ICD-10-CM

## 2013-01-05 DIAGNOSIS — E785 Hyperlipidemia, unspecified: Secondary | ICD-10-CM

## 2013-01-05 DIAGNOSIS — Z951 Presence of aortocoronary bypass graft: Secondary | ICD-10-CM

## 2013-01-05 DIAGNOSIS — R059 Cough, unspecified: Secondary | ICD-10-CM

## 2013-01-05 DIAGNOSIS — I10 Essential (primary) hypertension: Secondary | ICD-10-CM

## 2013-01-05 NOTE — Progress Notes (Signed)
Patient ID: Leah Levine, female   DOB: 12-Sep-1932, 79 y.o.   MRN: [id number] SUBJECTIVE: CC: Chief Complaint  Patient presents with  . Acute Visit    CHEST CONGESTION    HPI: Recently seen in ED for chest wall pain. She gets scared every time she feels something. She was told that it has only been 6 weeks since CABG that it is the pain from the sternotomy for her CABG.she gets sharp pains in the chest breast bone and it feels congested. No angina. She has had a CT chest to rule out Pulmonary embolism. She came to get checked again.  Trying to eat better.  Past Medical History  Diagnosis Date  . Tremor   . GERD (gastroesophageal reflux disease)   . Hyperlipidemia   . Allergy   . Urinary incontinence   . Diabetes mellitus type 2, controlled   . Hypertension     patient denies ever having hypertension  . Personal history of colonic polyps 11/20/2010    tubular adenomas  . Hypothyroidism   . Anemia   . Skin cancer     ear, right side of cheek   Past Surgical History  Procedure Laterality Date  . Appendectomy    . Cholecystectomy    . Abdominal hysterectomy    . Knee arthroscopy      bilateral  . Carpal tunnel release    . Back surgery    . Gallbladder surgery    . Knees Bilateral   . Coronary artery bypass graft N/A 11/26/2012    Procedure: CORONARY ARTERY BYPASS GRAFTING (CABG);  Surgeon: Delight Ovens, MD;  Location: [facility] OR;  Service: Open Heart Surgery;  Laterality: N/A;  . Intraoperative transesophageal echocardiogram N/A 11/26/2012    Procedure: INTRAOPERATIVE TRANSESOPHAGEAL ECHOCARDIOGRAM;  Surgeon: Delight Ovens, MD;  Location: [facility] OR;  Service: Open Heart Surgery;  Laterality: N/A;   History   Social History  . Marital Status: Widowed    Spouse Name: Onalee Hua    Number of Children: 2  . Years of Education: 11   Occupational History  . Retired    Social History Main Topics  . Smoking status: Never Smoker   . Smokeless tobacco: Never Used  . Alcohol  Use: No  . Drug Use: No  . Sexual Activity: Not on file   Other Topics Concern  . Not on file   Social History Narrative   Lives in [place] with her grandchildren.  She is widowed.  Her husband died in 08-25-2008.     Family History  Problem Relation Age of Onset  . Ovarian cancer Mother   . Diabetes Father   . Pneumonia Father   . Diabetes Sister    Current Outpatient Prescriptions on File Prior to Visit  Medication Sig Dispense Refill  . aspirin EC 325 MG EC tablet Take 1 tablet (325 mg total) by mouth daily.  30 tablet  0  . atorvastatin (LIPITOR) 80 MG tablet Take 1 tablet (80 mg total) by mouth daily at 6 PM.  30 tablet  1  . Bismuth Subsalicylate (PEPTO-BISMOL PO) Take 1 tablet by mouth daily as needed (for indigestion).       . ezetimibe (ZETIA) 10 MG tablet Take 1 tablet (10 mg total) by mouth daily.  30 tablet  1  . ferrous sulfate 325 (65 FE) MG tablet Take 1 tablet (325 mg total) by mouth daily with breakfast. For one month then stop  30 tablet  0  .  folic acid (FOLVITE) 1 MG tablet Take 1 tablet (1 mg total) by mouth daily. For one month then stop.      . INVESTIGATIONAL DRUG SIMPLE RECORD Take 7.5 mg by mouth 2 (two) times daily.      Marland Kitchen levothyroxine (SYNTHROID, LEVOTHROID) 100 MCG tablet Take 100 mcg by mouth daily.        Marland Kitchen lisinopril (PRINIVIL,ZESTRIL) 5 MG tablet Take 1 tablet (5 mg total) by mouth daily.  30 tablet  1  . nystatin cream (MYCOSTATIN) Apply topically 2 (two) times daily.  30 g  5  . oxyCODONE-acetaminophen (PERCOCET/ROXICET) 5-325 MG per tablet Take 1 tablet by mouth every 4 (four) hours as needed for pain.  15 tablet  0  . primidone (MYSOLINE) 50 MG tablet Take 50 mg by mouth 3 (three) times daily.      . propranolol (INDERAL) 10 MG tablet Take 10 mg by mouth daily.       Marland Kitchen senna (SENOKOT) 8.6 MG tablet Take 2 tablets by mouth at bedtime as needed for constipation.      . traMADol (ULTRAM) 50 MG tablet Take 1 tablet (50 mg total) by mouth every 6 (six)  hours as needed for pain.  40 tablet  0   No current facility-administered medications on file prior to visit.   Allergies  Allergen Reactions  . Celebrex [Celecoxib]     Muscle aches  . Crestor [Rosuvastatin Calcium]     Muscle aches  . Fenofibrate     Muscle aches  . Hydrocodone   . Lipitor [Atorvastatin Calcium]     Muscle aches  . Simvastatin     Muscle aches   Immunization History  Administered Date(s) Administered  . Pneumococcal Conjugate 03/27/1999  . Td 09/29/2008   Prior to Admission medications   Medication Sig Start Date End Date Taking? Authorizing Provider  aspirin EC 325 MG EC tablet Take 1 tablet (325 mg total) by mouth daily. 12/02/12   [name] Margaretann Loveless, PA-C  atorvastatin (LIPITOR) 80 MG tablet Take 1 tablet (80 mg total) by mouth daily at 6 PM. 12/02/12   [name] Margaretann Loveless, PA-C  Bismuth Subsalicylate (PEPTO-BISMOL PO) Take 1 tablet by mouth daily as needed (for indigestion).     Historical Provider, MD  ezetimibe (ZETIA) 10 MG tablet Take 1 tablet (10 mg total) by mouth daily. 12/02/12   [name] Margaretann Loveless, PA-C  ferrous sulfate 325 (65 FE) MG tablet Take 1 tablet (325 mg total) by mouth daily with breakfast. For one month then stop 12/02/12   [name] Margaretann Loveless, PA-C  folic acid (FOLVITE) 1 MG tablet Take 1 tablet (1 mg total) by mouth daily. For one month then stop. 12/02/12   [name] Margaretann Loveless, PA-C  INVESTIGATIONAL DRUG SIMPLE RECORD Take 7.5 mg by mouth 2 (two) times daily. 12/02/12   [name] Margaretann Loveless, PA-C  levothyroxine (SYNTHROID, LEVOTHROID) 100 MCG tablet Take 100 mcg by mouth daily.      Historical Provider, MD  lisinopril (PRINIVIL,ZESTRIL) 5 MG tablet Take 1 tablet (5 mg total) by mouth daily. 12/02/12   [name] Margaretann Loveless, PA-C  nystatin cream (MYCOSTATIN) Apply topically 2 (two) times daily. 09/15/12   Mary-Margaret Daphine Deutscher, FNP  oxyCODONE-acetaminophen (PERCOCET/ROXICET) 5-325 MG per tablet Take 1 tablet by mouth every 4 (four) hours as  needed for pain. 12/29/12   Trixie Dredge, PA-C  primidone (MYSOLINE) 50 MG tablet Take 50 mg by mouth 3 (three) times daily. 09/23/12   Levert Feinstein, MD  propranolol (INDERAL) 10 MG tablet Take 10 mg by mouth daily.  12/02/12   [name] Margaretann Loveless, PA-C  senna (SENOKOT) 8.6 MG tablet Take 2 tablets by mouth at bedtime as needed for constipation.    Historical Provider, MD  traMADol (ULTRAM) 50 MG tablet Take 1 tablet (50 mg total) by mouth every 6 (six) hours as needed for pain. 12/25/12   Delight Ovens, MD     ROS: As above in the HPI. All other systems are stable or negative.  OBJECTIVE: APPEARANCE:  Patient in no acute distress.The patient appeared well nourished and normally developed. Acyanotic. Waist: VITAL SIGNS:BP 112/67  Pulse 72  Temp(Src) 98.7 F (37.1 C) (Oral)  Wt 172 lb (78.019 kg)  BMI 31.45 kg/m2  SpO2 96% Elderly WF  SKIN: warm and  Dry without overt rashes, tattoos and scars  HEAD and Neck: without JVD, Head and scalp: normal Eyes:No scleral icterus. Fundi normal, eye movements normal. Ears: Auricle normal, canal normal, Tympanic membranes normal, insufflation normal. Nose: normal Throat: normal Neck & thyroid: normal  CHEST & LUNGS: Chest wall: sternotomy surgical scar fresh and clean Lungs: Clear  CVS: Reveals the PMI to be normally located. Regular rhythm, First and Second Heart sounds are normal,  absence of murmurs, rubs or gallops. Peripheral vasculature: Radial pulses: normal Dorsal pedis pulses: normal Posterior pulses: normal  ABDOMEN:  Appearance: Obese Benign, no organomegaly, no masses, no Abdominal Aortic enlargement. No Guarding , no rebound. No Bruits. Bowel sounds: normal  RECTAL: N/A GU: N/A  EXTREMETIES: nonedematous.  NEUROLOGIC: oriented to time,place and person; nonfocal.  ASSESSMENT: Chest pain - sternal pain/ sternotomy scar  Cough - Plan: DG Chest 2 View  Hyperlipidemia  Hypertension  S/P CABG x 4  Patient is  anxious about her health and outcome, and what to do to improve her life expectancy.  PLAN: Orders Placed This Encounter  Procedures  . DG Chest 2 View    Standing Status: Future     Number of Occurrences: 1     Standing Expiration Date: 03/07/2014    Order Specific Question:  Reason for Exam (SYMPTOM  OR DIAGNOSIS REQUIRED)    Answer:  cough    Order Specific Question:  Preferred imaging location?    Answer:  Internal   [facility] reading (PRIMARY) by  Dr. Modesto Charon: no acute findings. Chronic changes. Sternotomy wires in place.                                  We discussed a more aggressive plant based  Diet may improve her outcome, including the recommendations and medications of her cardiologists and CVTS.  reassured her about the sternotomy scar.  Return in about 2 months (around 03/07/2013).  Korynne Dols P. Modesto Charon, M.D.

## 2013-01-05 NOTE — Patient Instructions (Signed)
      Dr Easton Sivertson's Recommendations  For nutrition information, I recommend books:  1).Eat to Live by Dr Joel Fuhrman. 2).Prevent and Reverse Heart Disease by Dr Caldwell Esselstyn. 3) Dr Neal Barnard's Book:  Program to Reverse Diabetes   

## 2013-01-14 NOTE — Progress Notes (Signed)
Patient seen and examined together with physician assistant and I concur with the assessment and plan.  Dorian Pod, MD

## 2013-01-27 ENCOUNTER — Other Ambulatory Visit: Payer: Self-pay | Admitting: Cardiology

## 2013-01-27 ENCOUNTER — Encounter (HOSPITAL_COMMUNITY)
Admission: RE | Admit: 2013-01-27 | Discharge: 2013-01-27 | Disposition: A | Discharge status: Home / Self Care | Payer: Federal, State, Local not specified - PPO | Source: Ambulatory Visit | Attending: Cardiology | Admitting: Cardiology

## 2013-01-27 VITALS — BP 110/80 | HR 61 | Ht 62.0 in | Wt 173.2 lb

## 2013-01-27 DIAGNOSIS — Z951 Presence of aortocoronary bypass graft: Secondary | ICD-10-CM | POA: Insufficient documentation

## 2013-01-27 DIAGNOSIS — Z5189 Encounter for other specified aftercare: Secondary | ICD-10-CM | POA: Insufficient documentation

## 2013-01-27 DIAGNOSIS — I25810 Atherosclerosis of coronary artery bypass graft(s) without angina pectoris: Secondary | ICD-10-CM

## 2013-01-27 NOTE — Patient Instructions (Signed)
Pt has finished orientation and is scheduled to start CR on 01/28/13 at 9:30am. Pt has been instructed to arrive to class 15 minutes early for scheduled class. Pt has been instructed to wear comfortable clothing and shoes with rubber soles. Pt has been told to take their medications 1 hour prior to coming to class.  If the patient is not going to attend class, he/she has been instructed to call.

## 2013-01-27 NOTE — Progress Notes (Signed)
Patient referred to Cardiac Rehab by Dr. Gala Romney due to CABGx4. During orientation advised patient on arrival and appointment times what to wear, what to do before, during and after exercise. Reviewed attendance and class policy. Talked about inclement weather and class consultation policy. Pt is scheduled to start Cardiac Rehab on 01/28/13 at 9:30 am. Pt was advised to come to class 5 minutes before class starts. He was also given instructions on meeting with the dietician and attending the Family Structure classes. Pt is eager to get started. Patient was able to finish 6 minute walk test.

## 2013-01-28 ENCOUNTER — Encounter (HOSPITAL_COMMUNITY)
Admission: RE | Admit: 2013-01-28 | Discharge: 2013-01-28 | Disposition: A | Discharge status: Home / Self Care | Payer: Federal, State, Local not specified - PPO | Source: Ambulatory Visit | Attending: Cardiology | Admitting: Cardiology

## 2013-01-30 ENCOUNTER — Encounter (HOSPITAL_COMMUNITY)
Admission: RE | Admit: 2013-01-30 | Discharge: 2013-01-30 | Disposition: A | Discharge status: Home / Self Care | Payer: Federal, State, Local not specified - PPO | Source: Ambulatory Visit | Attending: Cardiology | Admitting: Cardiology

## 2013-02-02 ENCOUNTER — Encounter (HOSPITAL_COMMUNITY)
Admission: RE | Admit: 2013-02-02 | Discharge: 2013-02-02 | Disposition: A | Discharge status: Home / Self Care | Payer: Federal, State, Local not specified - PPO | Source: Ambulatory Visit | Attending: Cardiology | Admitting: Cardiology

## 2013-02-03 ENCOUNTER — Other Ambulatory Visit: Payer: Self-pay

## 2013-02-04 ENCOUNTER — Encounter: Payer: Self-pay | Admitting: Cardiology

## 2013-02-04 ENCOUNTER — Ambulatory Visit (INDEPENDENT_AMBULATORY_CARE_PROVIDER_SITE_OTHER): Payer: Federal, State, Local not specified - PPO | Admitting: Cardiology

## 2013-02-04 ENCOUNTER — Encounter (HOSPITAL_COMMUNITY)
Admission: RE | Admit: 2013-02-04 | Discharge: 2013-02-04 | Disposition: A | Discharge status: Home / Self Care | Payer: Federal, State, Local not specified - PPO | Source: Ambulatory Visit | Attending: Cardiology | Admitting: Cardiology

## 2013-02-04 VITALS — BP 150/80 | HR 64 | Ht 62.0 in | Wt 178.0 lb

## 2013-02-04 DIAGNOSIS — Z951 Presence of aortocoronary bypass graft: Secondary | ICD-10-CM

## 2013-02-04 NOTE — Progress Notes (Signed)
HPI The patient presents for evaluation after CABG.  Since she was last seen she's done much better. She had a bypass earlier in the year and a couple of ER visits with one overnight hospitalization for chest pain. However, she is no longer getting this. The patient denies any new symptoms such as chest discomfort, neck or arm discomfort. There has been no new shortness of breath, PND or orthopnea. There have been no reported palpitations, presyncope or syncope.     Allergies  Allergen Reactions  . Celebrex [Celecoxib]     Muscle aches  . Crestor [Rosuvastatin Calcium]     Muscle aches  . Fenofibrate     Muscle aches  . Lipitor [Atorvastatin Calcium]     Muscle aches  . Simvastatin     Muscle aches    Current Outpatient Prescriptions  Medication Sig Dispense Refill  . aspirin EC 325 MG EC tablet Take 1 tablet (325 mg total) by mouth daily.  30 tablet  0  . ferrous sulfate 325 (65 FE) MG tablet Take 1 tablet (325 mg total) by mouth daily with breakfast. For one month then stop  30 tablet  0  . folic acid (FOLVITE) 1 MG tablet Take 1 tablet (1 mg total) by mouth daily. For one month then stop.      . INVESTIGATIONAL DRUG SIMPLE RECORD Take 7.5 mg by mouth 2 (two) times daily.      Marland Kitchen levothyroxine (SYNTHROID, LEVOTHROID) 100 MCG tablet Take 100 mcg by mouth daily.        Marland Kitchen lisinopril (PRINIVIL,ZESTRIL) 5 MG tablet TAKE 1 TABLET BY MOUTH EVERY DAY  30 tablet  0  . oxyCODONE-acetaminophen (PERCOCET/ROXICET) 5-325 MG per tablet Take 1 tablet by mouth every 4 (four) hours as needed for pain.  15 tablet  0  . propranolol (INDERAL) 10 MG tablet Take 10 mg by mouth 2 (two) times daily.       Marland Kitchen senna (SENOKOT) 8.6 MG tablet Take 2 tablets by mouth at bedtime as needed for constipation.      . sitaGLIPtin-metformin (JANUMET) 50-500 MG per tablet Take 1 tablet by mouth 2 (two) times daily with a meal.      . solifenacin (VESICARE) 10 MG tablet Take 10 mg by mouth daily.       No current  facility-administered medications for this visit.    Past Medical History  Diagnosis Date  . Tremor   . GERD (gastroesophageal reflux disease)   . Hyperlipidemia   . Allergy   . Urinary incontinence   . Diabetes mellitus type 2, controlled   . Hypertension     patient denies ever having hypertension  . Personal history of colonic polyps 11/20/2010    tubular adenomas  . Hypothyroidism   . Anemia   . Skin cancer     ear, right side of cheek    Past Surgical History  Procedure Laterality Date  . Appendectomy    . Cholecystectomy    . Abdominal hysterectomy    . Knee arthroscopy      bilateral  . Carpal tunnel release    . Back surgery    . Gallbladder surgery    . Knees Bilateral   . Coronary artery bypass graft N/A 11/26/2012    Procedure: CORONARY ARTERY BYPASS GRAFTING (CABG);  Surgeon: Delight Ovens, MD;  [facility];  Service: Open Heart Surgery;  Laterality: N/A;  . Intraoperative transesophageal echocardiogram N/A 11/26/2012    Procedure:  INTRAOPERATIVE TRANSESOPHAGEAL ECHOCARDIOGRAM;  Surgeon: Delight Ovens, MD;  Location: [facility] OR;  Service: Open Heart Surgery;  Laterality: N/A;    ROS:  As stated in the HPI and negative for all other systems.  PHYSICAL EXAM Ht 5\' 2"  (1.575 m)  Wt 178 lb (80.74 kg)  BMI 32.55 kg/m2 GENERAL:  Well appearing NECK:  No jugular venous distention, waveform within normal limits, carotid upstroke brisk and symmetric, no bruits, no thyromegaly LUNGS:  Clear to auscultation bilaterally BACK:  No CVA tenderness CHEST:  Well healed sternotomy scar. HEART:  PMI not displaced or sustained,S1 and S2 within normal limits, no S3, no S4, no clicks, no rubs, no murmurs ABD:  Flat, positive bowel sounds normal in frequency in pitch, no bruits, no rebound, no guarding, no midline pulsatile mass, no hepatomegaly, no splenomegaly EXT:  2 plus pulses throughout, no edema, no cyanosis no clubbing   ASSESSMENT AND PLAN  CAD/CABG:  The  patient has no new sypmtoms.  No further cardiovascular testing is indicated.  We will continue with aggressive risk reduction and meds as listed.  She will continue with cardiac rehab.  HTN:  The blood pressure is at target. No change in medications is indicated. We will continue with therapeutic lifestyle changes (TLC).  DYSLIPIDEMIA:  I will defer to Dr. Modesto Charon.

## 2013-02-04 NOTE — Patient Instructions (Signed)
The current medical regimen is effective;  continue present plan and medications.  Follow up in 6 months with Dr Hochrein.  You will receive a letter in the mail 2 months before you are due.  Please call us when you receive this letter to schedule your follow up appointment.  

## 2013-02-05 ENCOUNTER — Other Ambulatory Visit: Payer: Self-pay

## 2013-02-05 MED ORDER — ATORVASTATIN CALCIUM 80 MG PO TABS: 80.0000 mg | ORAL_TABLET | Freq: Every day | ORAL | Status: DC

## 2013-02-06 ENCOUNTER — Encounter (HOSPITAL_COMMUNITY)
Admission: RE | Admit: 2013-02-06 | Discharge: 2013-02-06 | Disposition: A | Discharge status: Home / Self Care | Payer: Federal, State, Local not specified - PPO | Source: Ambulatory Visit | Attending: Cardiology | Admitting: Cardiology

## 2013-02-09 ENCOUNTER — Other Ambulatory Visit: Payer: Self-pay | Admitting: Family Medicine

## 2013-02-09 ENCOUNTER — Encounter (HOSPITAL_COMMUNITY)
Admission: RE | Admit: 2013-02-09 | Discharge: 2013-02-09 | Disposition: A | Discharge status: Home / Self Care | Payer: Federal, State, Local not specified - PPO | Source: Ambulatory Visit | Attending: Cardiology | Admitting: Cardiology

## 2013-02-11 ENCOUNTER — Encounter (HOSPITAL_COMMUNITY)
Admission: RE | Admit: 2013-02-11 | Discharge: 2013-02-11 | Disposition: A | Discharge status: Home / Self Care | Payer: Federal, State, Local not specified - PPO | Source: Ambulatory Visit | Attending: Cardiology | Admitting: Cardiology

## 2013-02-13 ENCOUNTER — Encounter (HOSPITAL_COMMUNITY)
Admission: RE | Admit: 2013-02-13 | Discharge: 2013-02-13 | Disposition: A | Discharge status: Home / Self Care | Payer: Federal, State, Local not specified - PPO | Source: Ambulatory Visit | Attending: Cardiology | Admitting: Cardiology

## 2013-02-16 ENCOUNTER — Encounter (HOSPITAL_COMMUNITY)
Admission: RE | Admit: 2013-02-16 | Discharge: 2013-02-16 | Disposition: A | Discharge status: Home / Self Care | Payer: Federal, State, Local not specified - PPO | Source: Ambulatory Visit | Attending: Cardiology | Admitting: Cardiology

## 2013-02-18 ENCOUNTER — Emergency Department (HOSPITAL_COMMUNITY)
Admission: EM | Admit: 2013-02-18 | Discharge: 2013-02-18 | Disposition: A | Discharge status: Home / Self Care | Payer: Federal, State, Local not specified - PPO | Attending: Emergency Medicine | Admitting: Emergency Medicine

## 2013-02-18 ENCOUNTER — Other Ambulatory Visit: Payer: Self-pay

## 2013-02-18 ENCOUNTER — Encounter (HOSPITAL_COMMUNITY): Payer: Self-pay | Admitting: Emergency Medicine

## 2013-02-18 ENCOUNTER — Encounter (HOSPITAL_COMMUNITY)
Admission: RE | Admit: 2013-02-18 | Discharge: 2013-02-18 | Disposition: A | Discharge status: Home / Self Care | Payer: Federal, State, Local not specified - PPO | Source: Ambulatory Visit | Attending: Cardiology | Admitting: Cardiology

## 2013-02-18 DIAGNOSIS — E785 Hyperlipidemia, unspecified: Secondary | ICD-10-CM | POA: Insufficient documentation

## 2013-02-18 DIAGNOSIS — D649 Anemia, unspecified: Secondary | ICD-10-CM | POA: Insufficient documentation

## 2013-02-18 DIAGNOSIS — Z860100 Personal history of colon polyps, unspecified: Secondary | ICD-10-CM | POA: Insufficient documentation

## 2013-02-18 DIAGNOSIS — Z79899 Other long term (current) drug therapy: Secondary | ICD-10-CM | POA: Insufficient documentation

## 2013-02-18 DIAGNOSIS — K219 Gastro-esophageal reflux disease without esophagitis: Secondary | ICD-10-CM | POA: Insufficient documentation

## 2013-02-18 DIAGNOSIS — E039 Hypothyroidism, unspecified: Secondary | ICD-10-CM | POA: Insufficient documentation

## 2013-02-18 DIAGNOSIS — Z951 Presence of aortocoronary bypass graft: Secondary | ICD-10-CM | POA: Insufficient documentation

## 2013-02-18 DIAGNOSIS — E119 Type 2 diabetes mellitus without complications: Secondary | ICD-10-CM | POA: Insufficient documentation

## 2013-02-18 DIAGNOSIS — I10 Essential (primary) hypertension: Secondary | ICD-10-CM | POA: Insufficient documentation

## 2013-02-18 DIAGNOSIS — R0789 Other chest pain: Secondary | ICD-10-CM | POA: Insufficient documentation

## 2013-02-18 DIAGNOSIS — Z7982 Long term (current) use of aspirin: Secondary | ICD-10-CM | POA: Insufficient documentation

## 2013-02-18 DIAGNOSIS — Z85828 Personal history of other malignant neoplasm of skin: Secondary | ICD-10-CM | POA: Insufficient documentation

## 2013-02-18 LAB — BASIC METABOLIC PANEL
BUN: 15 mg/dL (ref 6–23)
CO2: 27 mEq/L (ref 19–32)
Calcium: 8.9 mg/dL (ref 8.4–10.5)
Creatinine, Ser: 0.94 mg/dL (ref 0.50–1.10)
GFR calc Af Amer: 66 mL/min — ABNORMAL LOW (ref >90)
Glucose, Bld: 95 mg/dL (ref 70–99)
Sodium: 139 mEq/L (ref 135–145)

## 2013-02-18 LAB — CBC
HCT: 40.9 % (ref 36.0–46.0)
Hemoglobin: 13.2 g/dL (ref 12.0–15.0)
MCH: 26.6 pg (ref 26.0–34.0)
MCV: 82.3 fL (ref 78.0–100.0)
RBC: 4.97 MIL/uL (ref 3.87–5.11)
RDW: 15.3 % (ref 11.5–15.5)

## 2013-02-18 LAB — POCT I-STAT TROPONIN I: Troponin i, poc: 0 ng/mL (ref 0.00–0.08)

## 2013-02-18 NOTE — ED Notes (Signed)
Pt in via EMS c/o chest pain, points to epigastric area and under left breast area when describing pain, pain started around 3pm today, pt took an oxycodone and pain improved, rates pain 2/10 at this time, c/o nausea but denies other symptoms.

## 2013-02-18 NOTE — ED Provider Notes (Signed)
CSN: [id number]     Arrival date & time 02/18/13  1652 History   First MD Initiated Contact with Patient 02/18/13 1703     Chief Complaint  Patient presents with  . Chest Pain   (Consider location/radiation/quality/duration/timing/severity/associated sxs/prior Treatment) HPI Complains of left-sided anterior chest pain onset this afternoon lasting 10 minutes, resolved after  with 1 Percocet one tablet. It is accompanied by mild mild nausea no shortness of breath no sweatiness. She reports that she's been told to take Percocet for chest pain by her cardiologist. She's not been prescribed nitroglycerin. He is presently asymptomatic. Nothing makes symptoms better or worse. Past Medical History  Diagnosis Date  . Tremor   . GERD (gastroesophageal reflux disease)   . Hyperlipidemia   . Allergy   . Urinary incontinence   . Diabetes mellitus type 2, controlled   . Hypertension     patient denies ever having hypertension  . Personal history of colonic polyps 11/20/2010    tubular adenomas  . Hypothyroidism   . Anemia   . Skin cancer     ear, right side of cheek   Past Surgical History  Procedure Laterality Date  . Appendectomy    . Cholecystectomy    . Abdominal hysterectomy    . Knee arthroscopy      bilateral  . Carpal tunnel release    . Back surgery    . Gallbladder surgery    . Knees Bilateral   . Coronary artery bypass graft N/A 11/26/2012    Procedure: CORONARY ARTERY BYPASS GRAFTING (CABG);  Surgeon: Delight Ovens, MD;  Location: [facility] OR;  Service: Open Heart Surgery;  Laterality: N/A;  . Intraoperative transesophageal echocardiogram N/A 11/26/2012    Procedure: INTRAOPERATIVE TRANSESOPHAGEAL ECHOCARDIOGRAM;  Surgeon: Delight Ovens, MD;  Location: [facility] OR;  Service: Open Heart Surgery;  Laterality: N/A;   Family History  Problem Relation Age of Onset  . Ovarian cancer Mother   . Diabetes Father   . Pneumonia Father   . Diabetes Sister    History  Substance Use Topics   . Smoking status: Never Smoker   . Smokeless tobacco: Never Used  . Alcohol Use: No   OB History   Grav Para Term Preterm Abortions TAB SAB Ect Mult Living                 Review of Systems  Constitutional: Negative.   HENT: Negative.   Respiratory: Negative.   Cardiovascular: Positive for chest pain.  Gastrointestinal: Positive for nausea.  Musculoskeletal: Negative.   Skin: Negative.   Neurological: Negative.   Psychiatric/Behavioral: Negative.   All other systems reviewed and are negative.    Allergies  Celebrex; Crestor; Fenofibrate; Lipitor; and Simvastatin  Home Medications   Current Outpatient Rx  Name  Route  Sig  Dispense  Refill  . aspirin EC 325 MG EC tablet   Oral   Take 1 tablet (325 mg total) by mouth daily.   30 tablet   0   . ezetimibe (ZETIA) 10 MG tablet   Oral   Take 10 mg by mouth daily.         . ferrous sulfate 325 (65 FE) MG tablet   Oral   Take 1 tablet (325 mg total) by mouth daily with breakfast. For one month then stop   30 tablet   0   . folic acid (FOLVITE) 1 MG tablet   Oral   Take 1 tablet (1 mg total) by mouth  daily. For one month then stop.         . INVESTIGATIONAL DRUG SIMPLE RECORD   Oral   Take 7.5 mg by mouth 2 (two) times daily.         Marland Kitchen levothyroxine (SYNTHROID, LEVOTHROID) 100 MCG tablet   Oral   Take 100 mcg by mouth daily before breakfast.         . lisinopril (PRINIVIL,ZESTRIL) 5 MG tablet   Oral   Take 5 mg by mouth daily.         . naproxen sodium (ANAPROX) 220 MG tablet   Oral   Take 440 mg by mouth daily as needed (for pain).         Marland Kitchen oxyCODONE-acetaminophen (PERCOCET/ROXICET) 5-325 MG per tablet   Oral   Take 1 tablet by mouth every 4 (four) hours as needed for pain.   15 tablet   0   . propranolol (INDERAL) 10 MG tablet   Oral   Take 10 mg by mouth 2 (two) times daily.          Marland Kitchen senna (SENOKOT) 8.6 MG tablet   Oral   Take 2 tablets by mouth at bedtime as needed for  constipation.         . sitaGLIPtin-metformin (JANUMET) 50-500 MG per tablet   Oral   Take 1 tablet by mouth 2 (two) times daily with a meal.         . solifenacin (VESICARE) 10 MG tablet   Oral   Take 10 mg by mouth daily.          BP 128/62  Pulse 57  Temp(Src) 98 F (36.7 C) (Oral)  Resp 16  SpO2 99% Physical Exam  Nursing note and vitals reviewed. Constitutional: She appears well-developed and well-nourished.  HENT:  Head: Normocephalic and atraumatic.  Eyes: Conjunctivae are normal. Pupils are equal, round, and reactive to light.  Neck: Neck supple. No tracheal deviation present. No thyromegaly present.  Cardiovascular: Normal rate and regular rhythm.   No murmur heard. Pulmonary/Chest: Effort normal and breath sounds normal.  Abdominal: Soft. Bowel sounds are normal. She exhibits no distension. There is no tenderness.  Musculoskeletal: Normal range of motion. She exhibits no edema and no tenderness.  Neurological: She is alert. Coordination normal.  Skin: Skin is warm and dry. No rash noted.  Psychiatric: She has a normal mood and affect.    ED Course  Procedures (including critical care time) Labs Review Labs Reviewed  CBC  BASIC METABOLIC PANEL   Imaging Review No results found.  Date: 02/18/2013  Rate: 60  Rhythm: normal sinus rhythm  QRS Axis: left  Intervals: normal  ST/T Wave abnormalities: nonspecific T wave changes  Conduction Disutrbances:none  Narrative Interpretation:   Old EKG Reviewed: Unchanged from 12/29/2012 interpreted by me  EKG Interpretation   None      Patient remained asymptomatic throughout her entire emergency department course. Results for orders placed during the hospital encounter of 02/18/13  CBC      Result Value Range   WBC 8.2  4.0 - 10.5 K/uL   RBC 4.97  3.87 - 5.11 MIL/uL   Hemoglobin 13.2  12.0 - 15.0 g/dL   HCT 40.9  81.1 - 91.4 %   MCV 82.3  78.0 - 100.0 fL   MCH 26.6  26.0 - 34.0 pg   MCHC 32.3  30.0  - 36.0 g/dL   RDW 78.2  95.6 - 21.3 %   Platelets 244  150 - 400 K/uL  BASIC METABOLIC PANEL      Result Value Range   Sodium 139  135 - 145 mEq/L   Potassium 4.4  3.5 - 5.1 mEq/L   Chloride 102  96 - 112 mEq/L   CO2 27  19 - 32 mEq/L   Glucose, Bld 95  70 - 99 mg/dL   BUN 15  6 - 23 mg/dL   Creatinine, Ser 4.09  0.50 - 1.10 mg/dL   Calcium 8.9  8.4 - 81.1 mg/dL   GFR calc non Af Amer 57 (*) >90 mL/min   GFR calc Af Amer 66 (*) >90 mL/min  POCT I-STAT TROPONIN I      Result Value Range   Troponin i, poc 0.00  0.00 - 0.08 ng/mL   Comment 3           POCT I-STAT TROPONIN I      Result Value Range   Troponin i, poc 0.00  0.00 - 0.08 ng/mL   Comment 3            No results found.  MDM  No diagnosis found. Doubt acute coronary syndrome i.e. patient with CABG September of this year. Pain was brief, single episode lasted only 10 minutes and resolved after treatment with oxycodone tablet. Case discussed with Dr.Tilley, in light of negative troponins, nonacute EKG and brief episode of chest pain patient is okay to follow up with her cardiologist as outpatient Diagnosis atypical chest pain    Doug Sou, MD 02/18/13 2107

## 2013-02-20 ENCOUNTER — Encounter (HOSPITAL_COMMUNITY): Payer: Federal, State, Local not specified - PPO

## 2013-02-23 ENCOUNTER — Encounter (HOSPITAL_COMMUNITY)
Admission: RE | Admit: 2013-02-23 | Discharge: 2013-02-23 | Disposition: A | Discharge status: Home / Self Care | Payer: Federal, State, Local not specified - PPO | Source: Ambulatory Visit | Attending: Cardiology | Admitting: Cardiology

## 2013-02-23 DIAGNOSIS — Z951 Presence of aortocoronary bypass graft: Secondary | ICD-10-CM | POA: Insufficient documentation

## 2013-02-23 DIAGNOSIS — Z5189 Encounter for other specified aftercare: Secondary | ICD-10-CM | POA: Insufficient documentation

## 2013-02-25 ENCOUNTER — Encounter (HOSPITAL_COMMUNITY)
Admission: RE | Admit: 2013-02-25 | Discharge: 2013-02-25 | Disposition: A | Discharge status: Home / Self Care | Payer: Federal, State, Local not specified - PPO | Source: Ambulatory Visit | Attending: Cardiology | Admitting: Cardiology

## 2013-02-27 ENCOUNTER — Encounter (HOSPITAL_COMMUNITY)
Admission: RE | Admit: 2013-02-27 | Discharge: 2013-02-27 | Disposition: A | Discharge status: Home / Self Care | Payer: Federal, State, Local not specified - PPO | Source: Ambulatory Visit | Attending: Cardiology | Admitting: Cardiology

## 2013-03-02 ENCOUNTER — Encounter (HOSPITAL_COMMUNITY)
Admission: RE | Admit: 2013-03-02 | Discharge: 2013-03-02 | Disposition: A | Discharge status: Home / Self Care | Payer: Federal, State, Local not specified - PPO | Source: Ambulatory Visit | Attending: Cardiology | Admitting: Cardiology

## 2013-03-04 ENCOUNTER — Encounter (HOSPITAL_COMMUNITY)
Admission: RE | Admit: 2013-03-04 | Discharge: 2013-03-04 | Disposition: A | Discharge status: Home / Self Care | Payer: Federal, State, Local not specified - PPO | Source: Ambulatory Visit | Attending: Cardiology | Admitting: Cardiology

## 2013-03-05 ENCOUNTER — Encounter (INDEPENDENT_AMBULATORY_CARE_PROVIDER_SITE_OTHER): Payer: Medicare Other

## 2013-03-05 DIAGNOSIS — R0989 Other specified symptoms and signs involving the circulatory and respiratory systems: Secondary | ICD-10-CM

## 2013-03-06 ENCOUNTER — Encounter (HOSPITAL_COMMUNITY)
Admission: RE | Admit: 2013-03-06 | Discharge: 2013-03-06 | Disposition: A | Discharge status: Home / Self Care | Payer: Federal, State, Local not specified - PPO | Source: Ambulatory Visit | Attending: Cardiology | Admitting: Cardiology

## 2013-03-09 ENCOUNTER — Encounter (HOSPITAL_COMMUNITY): Payer: Federal, State, Local not specified - PPO

## 2013-03-10 ENCOUNTER — Other Ambulatory Visit: Payer: Self-pay | Admitting: Nurse Practitioner

## 2013-03-10 ENCOUNTER — Ambulatory Visit: Payer: Federal, State, Local not specified - PPO | Admitting: Family Medicine

## 2013-03-11 ENCOUNTER — Encounter (HOSPITAL_COMMUNITY)
Admission: RE | Admit: 2013-03-11 | Discharge: 2013-03-11 | Disposition: A | Discharge status: Home / Self Care | Payer: Federal, State, Local not specified - PPO | Source: Ambulatory Visit | Attending: Cardiology | Admitting: Cardiology

## 2013-03-13 ENCOUNTER — Encounter (HOSPITAL_COMMUNITY)
Admission: RE | Admit: 2013-03-13 | Discharge: 2013-03-13 | Disposition: A | Discharge status: Home / Self Care | Payer: Federal, State, Local not specified - PPO | Source: Ambulatory Visit | Attending: Cardiology | Admitting: Cardiology

## 2013-03-16 ENCOUNTER — Encounter (HOSPITAL_COMMUNITY)
Admission: RE | Admit: 2013-03-16 | Discharge: 2013-03-16 | Disposition: A | Discharge status: Home / Self Care | Payer: Federal, State, Local not specified - PPO | Source: Ambulatory Visit | Attending: Cardiology | Admitting: Cardiology

## 2013-03-18 ENCOUNTER — Encounter (HOSPITAL_COMMUNITY)
Admission: RE | Admit: 2013-03-18 | Discharge: 2013-03-18 | Disposition: A | Discharge status: Home / Self Care | Payer: Federal, State, Local not specified - PPO | Source: Ambulatory Visit | Attending: Cardiology | Admitting: Cardiology

## 2013-03-20 ENCOUNTER — Encounter (HOSPITAL_COMMUNITY)
Admission: RE | Admit: 2013-03-20 | Discharge: 2013-03-20 | Disposition: A | Discharge status: Home / Self Care | Payer: Federal, State, Local not specified - PPO | Source: Ambulatory Visit | Attending: Cardiology | Admitting: Cardiology

## 2013-03-23 ENCOUNTER — Encounter (HOSPITAL_COMMUNITY)
Admission: RE | Admit: 2013-03-23 | Discharge: 2013-03-23 | Disposition: A | Discharge status: Home / Self Care | Payer: Federal, State, Local not specified - PPO | Source: Ambulatory Visit | Attending: Cardiology | Admitting: Cardiology

## 2013-03-24 ENCOUNTER — Telehealth: Payer: Self-pay | Admitting: Family Medicine

## 2013-03-24 NOTE — Telephone Encounter (Signed)
appt given for tomorrow at 12

## 2013-03-25 ENCOUNTER — Ambulatory Visit (INDEPENDENT_AMBULATORY_CARE_PROVIDER_SITE_OTHER): Payer: Federal, State, Local not specified - PPO | Admitting: Family Medicine

## 2013-03-25 ENCOUNTER — Encounter (HOSPITAL_COMMUNITY)
Admission: RE | Admit: 2013-03-25 | Discharge: 2013-03-25 | Disposition: A | Discharge status: Home / Self Care | Payer: Federal, State, Local not specified - PPO | Source: Ambulatory Visit | Attending: Cardiology | Admitting: Cardiology

## 2013-03-25 ENCOUNTER — Encounter: Payer: Self-pay | Admitting: Family Medicine

## 2013-03-25 VITALS — BP 139/71 | HR 63 | Temp 97.5°F | Ht 60.5 in | Wt 175.8 lb

## 2013-03-25 DIAGNOSIS — H8110 Benign paroxysmal vertigo, unspecified ear: Secondary | ICD-10-CM

## 2013-03-25 MED ORDER — MECLIZINE HCL 25 MG PO TABS: 25.0000 mg | ORAL_TABLET | Freq: Three times a day (TID) | ORAL | Status: DC | PRN

## 2013-03-25 NOTE — Patient Instructions (Signed)
Vertigo Vertigo means you feel like you or your surroundings are moving when they are not. Vertigo can be dangerous if it occurs when you are at work, driving, or performing difficult activities.  CAUSES  Vertigo occurs when there is a conflict of signals sent to your brain from the visual and sensory systems in your body. There are many different causes of vertigo, including:  Infections, especially in the inner ear.  A bad reaction to a drug or misuse of alcohol and medicines.  Withdrawal from drugs or alcohol.  Rapidly changing positions, such as lying down or rolling over in bed.  A migraine headache.  Decreased blood flow to the brain.  Increased pressure in the brain from a head injury, infection, tumor, or bleeding. SYMPTOMS  You may feel as though the world is spinning around or you are falling to the ground. Because your balance is upset, vertigo can cause nausea and vomiting. You may have involuntary eye movements (nystagmus). DIAGNOSIS  Vertigo is usually diagnosed by physical exam. If the cause of your vertigo is unknown, your caregiver may perform imaging tests, such as an MRI scan (magnetic resonance imaging). TREATMENT  Most cases of vertigo resolve on their own, without treatment. Depending on the cause, your caregiver may prescribe certain medicines. If your vertigo is related to body position issues, your caregiver may recommend movements or procedures to correct the problem. In rare cases, if your vertigo is caused by certain inner ear problems, you may need surgery. HOME CARE INSTRUCTIONS   Follow your caregiver's instructions.  Avoid driving.  Avoid operating heavy machinery.  Avoid performing any tasks that would be dangerous to you or others during a vertigo episode.  Tell your caregiver if you notice that certain medicines seem to be causing your vertigo. Some of the medicines used to treat vertigo episodes can actually make them worse in some people. SEEK  IMMEDIATE MEDICAL CARE IF:   Your medicines do not relieve your vertigo or are making it worse.  You develop problems with talking, walking, weakness, or using your arms, hands, or legs.  You develop severe headaches.  Your nausea or vomiting continues or gets worse.  You develop visual changes.  A family member notices behavioral changes.  Your condition gets worse. MAKE SURE YOU:  Understand these instructions.  Will watch your condition.  Will get help right away if you are not doing well or get worse. Document Released: 12/20/2004 Document Revised: 06/04/2011 Document Reviewed: 09/28/2010 Exit[employer] Patient Information 2014 [employer].  

## 2013-03-25 NOTE — Progress Notes (Signed)
Patient ID: Leah Levine, female   DOB: [birth year], 79 y.o.   MRN: [id number] SUBJECTIVE: CC: Chief Complaint  Patient presents with  . Acute Visit    thinks has inner ear states saw dr hochrein 6 weeks ago and had EKG done 02-2013    HPI: Used to have a lot of vertigo in the 1960s. Hospitalized in August 30, 1963 for meningitis and had vertigo bad since. But was good the last few years. Doing fine since heart surgery. Just started to have episode of vertigo with the room moving No chest pain no diaphoresis, no palpitations.  Past Medical History  Diagnosis Date  . Tremor   . GERD (gastroesophageal reflux disease)   . Hyperlipidemia   . Allergy   . Urinary incontinence   . Diabetes mellitus type 2, controlled   . Hypertension     patient denies ever having hypertension  . Personal history of colonic polyps 11/20/2010    tubular adenomas  . Hypothyroidism   . Anemia   . Skin cancer     ear, right side of cheek   Past Surgical History  Procedure Laterality Date  . Appendectomy    . Cholecystectomy    . Abdominal hysterectomy    . Knee arthroscopy      bilateral  . Carpal tunnel release    . Back surgery    . Gallbladder surgery    . Knees Bilateral   . Coronary artery bypass graft N/A 11/26/2012    Procedure: CORONARY ARTERY BYPASS GRAFTING (CABG);  Surgeon: Delight Ovens, MD;  Location: [facility] OR;  Service: Open Heart Surgery;  Laterality: N/A;  . Intraoperative transesophageal echocardiogram N/A 11/26/2012    Procedure: INTRAOPERATIVE TRANSESOPHAGEAL ECHOCARDIOGRAM;  Surgeon: Delight Ovens, MD;  Location: [facility] OR;  Service: Open Heart Surgery;  Laterality: N/A;   History   Social History  . Marital Status: Widowed    Spouse Name: Onalee Hua    Number of Children: 2  . Years of Education: 11   Occupational History  . Retired    Social History Main Topics  . Smoking status: Never Smoker   . Smokeless tobacco: Never Used  . Alcohol Use: No  . Drug Use: No  . Sexual  Activity: Not on file   Other Topics Concern  . Not on file   Social History Narrative   Lives in [place] with her grandchildren.  She is widowed.  Her husband died in 08/29/2008.     Family History  Problem Relation Age of Onset  . Ovarian cancer Mother   . Diabetes Father   . Pneumonia Father   . Diabetes Sister    Current Outpatient Prescriptions on File Prior to Visit  Medication Sig Dispense Refill  . aspirin EC 325 MG EC tablet Take 1 tablet (325 mg total) by mouth daily.  30 tablet  0  . ezetimibe (ZETIA) 10 MG tablet Take 10 mg by mouth daily.      . ferrous sulfate 325 (65 FE) MG tablet Take 1 tablet (325 mg total) by mouth daily with breakfast. For one month then stop  30 tablet  0  . folic acid (FOLVITE) 1 MG tablet Take 1 tablet (1 mg total) by mouth daily. For one month then stop.      Marland Kitchen levothyroxine (SYNTHROID, LEVOTHROID) 100 MCG tablet Take 100 mcg by mouth daily before breakfast.      . lisinopril (PRINIVIL,ZESTRIL) 5 MG tablet Take 5 mg by mouth daily.      Marland Kitchen  naproxen sodium (ANAPROX) 220 MG tablet Take 440 mg by mouth daily as needed (for pain).      Marland Kitchen oxyCODONE-acetaminophen (PERCOCET/ROXICET) 5-325 MG per tablet Take 1 tablet by mouth every 4 (four) hours as needed for pain.  15 tablet  0  . propranolol (INDERAL) 10 MG tablet Take 10 mg by mouth 2 (two) times daily.       . propranolol (INDERAL) 60 MG tablet TAKE 1 TABLET BY MOUTH TWICE DAILY  180 tablet  0  . senna (SENOKOT) 8.6 MG tablet Take 2 tablets by mouth at bedtime as needed for constipation.      . sitaGLIPtin-metformin (JANUMET) 50-500 MG per tablet Take 1 tablet by mouth 2 (two) times daily with a meal.      . solifenacin (VESICARE) 10 MG tablet Take 10 mg by mouth daily.      . INVESTIGATIONAL DRUG SIMPLE RECORD Take 7.5 mg by mouth 2 (two) times daily.       No current facility-administered medications on file prior to visit.   Allergies  Allergen Reactions  . Celebrex [Celecoxib] Other (See Comments)     Muscle aches and leg pains  . Crestor [Rosuvastatin Calcium] Other (See Comments)    Muscle aches and leg pains  . Fenofibrate Other (See Comments)    Muscle aches and leg pains  . Lipitor [Atorvastatin Calcium] Other (See Comments)    Muscle aches and leg pains  . Simvastatin Other (See Comments)    Muscle aches and leg pains   Immunization History  Administered Date(s) Administered  . Pneumococcal Conjugate-13 03/27/1999  . Td 09/29/2008   Prior to Admission medications   Medication Sig Start Date End Date Taking? Authorizing Provider  aspirin EC 325 MG EC tablet Take 1 tablet (325 mg total) by mouth daily. 12/02/12  Yes [name] Margaretann Loveless, PA-C  atorvastatin (LIPITOR) 80 MG tablet  02/05/13  Yes Historical Provider, MD  ezetimibe (ZETIA) 10 MG tablet Take 10 mg by mouth daily.   Yes Historical Provider, MD  ferrous sulfate 325 (65 FE) MG tablet Take 1 tablet (325 mg total) by mouth daily with breakfast. For one month then stop 12/02/12  Yes [name] Margaretann Loveless, PA-C  folic acid (FOLVITE) 1 MG tablet Take 1 tablet (1 mg total) by mouth daily. For one month then stop. 12/02/12  Yes [name] Margaretann Loveless, PA-C  levothyroxine (SYNTHROID, LEVOTHROID) 100 MCG tablet Take 100 mcg by mouth daily before breakfast.   Yes Historical Provider, MD  lisinopril (PRINIVIL,ZESTRIL) 5 MG tablet Take 5 mg by mouth daily.   Yes Historical Provider, MD  naproxen sodium (ANAPROX) 220 MG tablet Take 440 mg by mouth daily as needed (for pain).   Yes Historical Provider, MD  oxyCODONE-acetaminophen (PERCOCET/ROXICET) 5-325 MG per tablet Take 1 tablet by mouth every 4 (four) hours as needed for pain. 12/29/12  Yes Trixie Dredge, PA-C  propranolol (INDERAL) 10 MG tablet Take 10 mg by mouth 2 (two) times daily.  12/02/12  Yes [name] Margaretann Loveless, PA-C  propranolol (INDERAL) 60 MG tablet TAKE 1 TABLET BY MOUTH TWICE DAILY 03/10/13  Yes Ileana Ladd, MD  senna (SENOKOT) 8.6 MG tablet Take 2 tablets by mouth at bedtime  as needed for constipation.   Yes Historical Provider, MD  sitaGLIPtin-metformin (JANUMET) 50-500 MG per tablet Take 1 tablet by mouth 2 (two) times daily with a meal.   Yes Historical Provider, MD  solifenacin (VESICARE) 10 MG tablet Take 10 mg by mouth daily.  Yes Historical Provider, MD  INVESTIGATIONAL DRUG SIMPLE RECORD Take 7.5 mg by mouth 2 (two) times daily. 12/02/12   [name] Margaretann Loveless, PA-C     ROS: As above in the HPI. All other systems are stable or negative.  OBJECTIVE: APPEARANCE:  Patient in no acute distress.The patient appeared well nourished and normally developed. Acyanotic. Waist: VITAL SIGNS:BP 139/71  Pulse 63  Temp(Src) 97.5 F (36.4 C) (Oral)  Ht 5' 0.5" (1.537 m)  Wt 175 lb 12.8 oz (79.742 kg)  BMI 33.76 kg/m2 Obese WF  SKIN: warm and  Dry without overt rashes, tattoos and scars  HEAD and Neck: without JVD, Head and scalp: normal Eyes:No scleral icterus. Fundi normal, eye movements normal. Ears: Auricle normal, canal normal, Tympanic membranes normal, insufflation normal. Nose: normal Throat: normal Neck & thyroid: normal  CHEST & LUNGS: Chest wall: normal Lungs: Clear  CVS: Reveals the PMI to be normally located. Regular rhythm, First and Second Heart sounds are normal,  absence of murmurs, rubs or gallops. Peripheral vasculature: Radial pulses: normal Dorsal pedis pulses: normal Posterior pulses: normal  ABDOMEN:  Appearance: normal Benign, no organomegaly, no masses, no Abdominal Aortic enlargement. No Guarding , no rebound. No Bruits. Bowel sounds: normal  RECTAL: N/A GU: N/A  EXTREMETIES: nonedematous.   NEUROLOGIC: oriented to time,place and person; nonfocal. Strength is normal Sensory is normal Reflexes are normal Cranial Nerves are normal. Dix-Hallpike: mildly positive.   ASSESSMENT: Benign paroxysmal positional vertigo - Plan: meclizine (ANTIVERT) 25 MG tablet  PLAN: Discussed risk for falling.  Handout on  Vertigo. Discussed exercises that she could try to reduce recurrences.   No orders of the defined types were placed in this encounter.   Meds ordered this encounter  Medications  . atorvastatin (LIPITOR) 80 MG tablet    Sig:   . meclizine (ANTIVERT) 25 MG tablet    Sig: Take 1 tablet (25 mg total) by mouth 3 (three) times daily as needed for dizziness.    Dispense:  30 tablet    Refill:  0   There are no discontinued medications. Return if symptoms worsen or fail to improve.  Haiden Clucas P. Modesto Charon, M.D.

## 2013-04-03 ENCOUNTER — Ambulatory Visit (INDEPENDENT_AMBULATORY_CARE_PROVIDER_SITE_OTHER): Payer: Federal, State, Local not specified - PPO | Admitting: Family Medicine

## 2013-04-03 DIAGNOSIS — R7989 Other specified abnormal findings of blood chemistry: Secondary | ICD-10-CM

## 2013-04-04 LAB — BMP8+EGFR
BUN / CREAT RATIO: 16 (ref 11–26)
BUN: 12 mg/dL (ref 8–27)
CALCIUM: 9.7 mg/dL (ref 8.6–10.2)
CO2: 27 mmol/L (ref 18–29)
Chloride: 96 mmol/L — ABNORMAL LOW (ref 97–108)
Creatinine, Ser: 0.75 mg/dL (ref 0.57–1.00)
GFR, EST AFRICAN AMERICAN: 88 mL/min/{1.73_m2} (ref >59)
GFR, EST NON AFRICAN AMERICAN: 77 mL/min/{1.73_m2} (ref >59)
Glucose: 143 mg/dL — ABNORMAL HIGH (ref 65–99)
POTASSIUM: 4.6 mmol/L (ref 3.5–5.2)
SODIUM: 141 mmol/L (ref 134–144)

## 2013-04-04 NOTE — Progress Notes (Signed)
Patient ID: Leah Levine, female   DOB: 1932-08-08, 80 y.o.   MRN: [id number] Not seen [name]. [name], M.D.

## 2013-04-06 NOTE — Progress Notes (Signed)
Quick Note:  Call Patient Labs that are abnormal: Sugar a little high  The rest are at goal  Recommendations: Past due for office visit to follo wup on the DM   ______

## 2013-04-14 NOTE — Addendum Note (Signed)
Encounter addended by: Norlene Duel, RN on: 04/14/2013  1:03 PM<BR>     Documentation filed: Notes Section

## 2013-04-14 NOTE — Progress Notes (Signed)
Cardiac Rehabilitation Program Outcomes Report   Orientation:  01/27/2013 Graduate Date:  03/25/2013 Discharge Date:  03/25/2013 # of sessions completed: 23 DX: CABG X 4/ MI  Cardiologist: Percival Spanish Family MD:  Yaakov Guthrie Class Time:  09:30  A.  Exercise Program:  Tolerates exercise @ 3.78 METS for 15 minutes, Walk Test Results:  Post: Post walk Test: Resting HR 71, BP 110/70, O2 98%, RPE 7 and RPD 7. 6 minute HR 100, BP 140/80, O2 98% RPE 9 and RPD 9, Post Hr 87, BP 120/60, O2 98% RPE 7 and RPD 7 Walked 1200 feet at 2.3 mph at 2.7 METS and Discharged to home exercise program.  Anticipated compliance:  excellent  B.  Mental Health:  Good mental attitude  C.  Education/Instruction/Skills  Accurately checks own pulse.  Rest:  60  Exercise:  104, Knows THR for exercise, Uses Perceived Exertion Scale and/or Dyspnea Scale and Attended 9 education classes  Uses Perceived Exertion Scale and/or Dyspnea Scale  D.  Nutrition/Weight Control/Body Composition:  Adherence to prescribed nutrition program: good    E.  Blood Lipids    Lab Results  Component Value Date   CHOL 161 11/26/2012   HDL 23* 11/26/2012   LDLCALC 71 11/26/2012   TRIG 333* 11/26/2012   CHOLHDL 7.0 11/26/2012    F.  Lifestyle Changes:  Making positive lifestyle changes  G.  Symptoms noted with exercise:  Asymptomatic  Report Completed By:  Oletta Lamas. Osa Fogarty RN   Comments:  This is patients discharge(Graduation ) Report. She only attended 23 sessions because of her Insurance. She has done very well while in Cardiac Rehab. Hr resting HR was 60 and her resting BP was 110/70 and her peak HR was 104 and peak BP was 122/62. She plans to exercise at home by walking as weather permits. She also plans to continue to go dancing as much as possible.A call will be made to the patient at 1 month post discharge,at 6 months and at 1 year to comfirm compliance.

## 2013-04-14 NOTE — Addendum Note (Signed)
Encounter addended by: Norlene Duel, RN on: 04/14/2013  1:19 PM<BR>     Documentation filed: Notes Section, Clinical Notes

## 2013-04-14 NOTE — Addendum Note (Signed)
Encounter addended by: Norlene Duel, RN on: 04/14/2013 12:04 PM<BR>     Documentation filed: Notes Section

## 2013-04-14 NOTE — Addendum Note (Signed)
Encounter addended by: Norlene Duel, RN on: 04/14/2013  1:20 PM<BR>     Documentation filed: Clinical Notes

## 2013-04-14 NOTE — Progress Notes (Signed)
Cardiac Rehabilitation Program Outcomes Report   Orientation:  01/27/2013 1st week report: 02/02/2013 Graduate Date:  tbd  Discharge Date:  tbd # of sessions completed: 3 DX: CABG X 4/ MI  Cardiologist: Hochrein Family MD:  Canary Brim Time:  09:30  A.  Exercise Program:  Tolerates exercise @ 2.90 METS for 15 minutes and Walk Test Results:  Pre: Pre walk Test: resting HR 61, BP 110/80, O2 97%, RPE 7 and RPD 7. 6 minute HR 77, BP 120/70, O2 98%, RPE 9 and RPD 9,, Post HR 62 BP 100/70, o2 99%, RPE 6 and RPD 6. walked 1050 feet at 2.3 mph and 2.52 METS  B.  Mental Health:  Good mental attitude  C.  Education/Instruction/Skills  Accurately checks own pulse.  Rest:  112  Exercise 70, Knows THR for exercise and Uses Perceived Exertion Scale and/or Dyspnea Scale  Uses Perceived Exertion Scale and/or Dyspnea Scale  D.  Nutrition/Weight Control/Body Composition:  Adherence to prescribed nutrition program: good    E.  Blood Lipids    Lab Results  Component Value Date   CHOL 161 11/26/2012   HDL 23* 11/26/2012   LDLCALC 71 11/26/2012   TRIG 333* 11/26/2012   CHOLHDL 7.0 11/26/2012    F.  Lifestyle Changes:  Making positive lifestyle changes  G.  Symptoms noted with exercise:  Asymptomatic  Report Completed By:  Oletta Lamas. Lyman Balingit RN   Comments:  This is patients 1st week report. She has done well her first week. She achieved a peak METS of 2.90. Her resting HR was 112 and BP was 122/84 and her peak HR was 70 and peak BP was 138/70. A report will follow upon the patients halfway  Point 18 th visit.

## 2013-04-14 NOTE — Progress Notes (Signed)
Cardiac Rehabilitation Program Outcomes Report   Orientation:  01/27/2013 Halfway Report: 03/13/2013 Graduate Date:  tbd Discharge Date:  tbd # of sessions completed: 18 DX: CABG X 4/MI  Cardiologist: Percival Spanish Family MD:  Eliezer Lofts Time:  09:30  A.  Exercise Program:  Tolerates exercise @ 3.78 METS for 15 minutes  B.  Mental Health:  Good mental attitude  C.  Education/Instruction/Skills  Accurately checks own pulse.  Rest:  78  Exercise:  73, Knows THR for exercise and Uses Perceived Exertion Scale and/or Dyspnea Scale  Uses Perceived Exertion Scale and/or Dyspnea Scale  D.  Nutrition/Weight Control/Body Composition:  Adherence to prescribed nutrition program: good    E.  Blood Lipids    Lab Results  Component Value Date   CHOL 161 11/26/2012   HDL 23* 11/26/2012   LDLCALC 71 11/26/2012   TRIG 333* 11/26/2012   CHOLHDL 7.0 11/26/2012    F.  Lifestyle Changes:  Making positive lifestyle changes and Not smoking:  Quit never smoked  G.  Symptoms noted with exercise:  Asymptomatic  Report Completed By:  Oletta Lamas. Brithany Whitworth RN   Comments:  This is patients halfway report. She has done well in rehab. She achieved a peak METS of 3.78. Her resting Hr was 78 and resting BP was 138/70. Her peak HR was 73 and her peak BP was 128/70. A graduation report will follow upon her 69 th visit.

## 2013-04-14 NOTE — Addendum Note (Signed)
Encounter addended by: Norlene Duel, RN on: 04/14/2013 12:06 PM<BR>     Documentation filed: Clinical Notes

## 2013-04-17 ENCOUNTER — Ambulatory Visit: Payer: Federal, State, Local not specified - PPO | Admitting: Family Medicine

## 2013-04-24 ENCOUNTER — Other Ambulatory Visit: Payer: Self-pay | Admitting: *Deleted

## 2013-04-24 DIAGNOSIS — H8110 Benign paroxysmal vertigo, unspecified ear: Secondary | ICD-10-CM

## 2013-04-24 MED ORDER — MECLIZINE HCL 25 MG PO TABS: 25.0000 mg | ORAL_TABLET | Freq: Three times a day (TID) | ORAL | Status: DC | PRN

## 2013-05-19 ENCOUNTER — Telehealth: Payer: Self-pay | Admitting: Family Medicine

## 2013-05-29 ENCOUNTER — Other Ambulatory Visit: Payer: Self-pay | Admitting: Neurosurgery

## 2013-05-29 DIAGNOSIS — M5136 Other intervertebral disc degeneration, lumbar region: Secondary | ICD-10-CM

## 2013-06-03 ENCOUNTER — Ambulatory Visit: Payer: Self-pay | Admitting: Family Medicine

## 2013-06-09 ENCOUNTER — Ambulatory Visit
Admission: RE | Admit: 2013-06-09 | Discharge: 2013-06-09 | Disposition: A | Discharge status: Home / Self Care | Payer: Federal, State, Local not specified - PPO | Source: Ambulatory Visit | Attending: Neurosurgery | Admitting: Neurosurgery

## 2013-06-09 ENCOUNTER — Other Ambulatory Visit: Payer: Self-pay | Admitting: Neurosurgery

## 2013-06-09 VITALS — BP 156/77 | HR 62

## 2013-06-09 DIAGNOSIS — M5136 Other intervertebral disc degeneration, lumbar region: Secondary | ICD-10-CM

## 2013-06-09 DIAGNOSIS — M51369 Other intervertebral disc degeneration, lumbar region without mention of lumbar back pain or lower extremity pain: Secondary | ICD-10-CM

## 2013-06-09 MED ORDER — IOHEXOL 180 MG/ML  SOLN
20.0000 mL | Freq: Once | INTRAMUSCULAR | Status: AC | PRN
  Administered 2013-06-09: 20 mL via INTRATHECAL

## 2013-06-09 MED ORDER — DIAZEPAM 5 MG PO TABS
5.0000 mg | ORAL_TABLET | Freq: Once | ORAL | Status: AC
  Administered 2013-06-09: 5 mg via ORAL

## 2013-06-09 NOTE — Discharge Instructions (Signed)

## 2013-06-18 ENCOUNTER — Encounter: Payer: Self-pay | Admitting: Pharmacist

## 2013-06-18 ENCOUNTER — Ambulatory Visit (INDEPENDENT_AMBULATORY_CARE_PROVIDER_SITE_OTHER): Payer: Federal, State, Local not specified - PPO | Admitting: Pharmacist

## 2013-06-18 VITALS — BP 130/68 | HR 67 | Ht 60.5 in | Wt 181.0 lb

## 2013-06-18 DIAGNOSIS — I10 Essential (primary) hypertension: Secondary | ICD-10-CM

## 2013-06-18 DIAGNOSIS — Z951 Presence of aortocoronary bypass graft: Secondary | ICD-10-CM

## 2013-06-18 DIAGNOSIS — E039 Hypothyroidism, unspecified: Secondary | ICD-10-CM | POA: Insufficient documentation

## 2013-06-18 DIAGNOSIS — E119 Type 2 diabetes mellitus without complications: Secondary | ICD-10-CM

## 2013-06-18 DIAGNOSIS — E785 Hyperlipidemia, unspecified: Secondary | ICD-10-CM

## 2013-06-18 LAB — POCT GLYCOSYLATED HEMOGLOBIN (HGB A1C): Hemoglobin A1C: 8.1

## 2013-06-18 MED ORDER — MIRABEGRON ER 25 MG PO TB24: 25.0000 mg | ORAL_TABLET | Freq: Every day | ORAL | Status: DC

## 2013-06-18 MED ORDER — SITAGLIPTIN PHOS-METFORMIN HCL 50-1000 MG PO TABS: 1.0000 | ORAL_TABLET | Freq: Two times a day (BID) | ORAL | Status: DC

## 2013-06-18 NOTE — Progress Notes (Signed)
Diabetes Follow-Up Visit Chief Complaint:   Chief Complaint  Patient presents with  . Diabetes  . Hyperlipidemia     Filed Vitals:   06/18/13 1004  BP: 130/68  Pulse: 67    HPI: patient with type 2 DM that she reports has been poorly controlled since heart surgery in 2014.  A1c today is 8.1% today.  She report that she is not checking BG regularly just because she hasn't felt like it.  Current Diabetes Medications:  janumet 50/589m 1 tablet bid  Low fat/carbohydrate diet?  No Nicotine Abuse?  No Medication Compliance?  No Exercise?  No Alcohol Abuse?  No   Exam Edema:  negative   Polyuria:  Positive (but states better when she has Myrbetriq to take)  Polydipsia:  neagtive Polyphagia:  negative  BMI:  Body mass index is 34.75 kg/(m^2).   Weight changes:  stable General Appearance:  alert, oriented, no acute distress and obese Mood/Affect:  normal   Lab Results  Component Value Date   HGBA1C 8.1 06/18/2013    No results found for this basename:Derl Barrow   Lab Results  Component Value Date   CHOL 161 11/26/2012   HDL 23* 11/26/2012   LDLCALC 71 11/26/2012   TRIG 333* 11/26/2012   CHOLHDL 7.0 11/26/2012      Assessment: 1.  Diabetes.  uncontrolled 2.  Blood Pressure.  At goals 3.  Lipids.  Need to recheck - last was 11/2012 4.  History of hypothyroidism - needs labs   Recommendations: 1.  Medication recommendations at this time are as follows:    Increase Janumet to 50/1000mg 1 tablet twice a day with food - #28 samples given and rx sent  Gave samples of Myrbetriq 226m1 tablet daily 2.  Reviewed HBG goals:  Fasting 80-130 and 1-2 hour post prandial <180.  Patient is instructed to check BG 2 times per day.    3.  BP goal < 140/85. 4.  LDL goal of < 100, HDL > 40 and TG < 150. - drawn to day/pending  5.  Dietary recommendations:  Discussed limiting portion sizes, increasing non starchy vegetables, increase whole grains. 6.  Physical Activity  recommendations:  Increase as able 7.   Orders Placed This Encounter  Procedures  . NMR, lipoprofile  . CMP14+EGFR  . Vit D  25 hydroxy (rtn osteoporosis monitoring)  . Thyroid Panel With TSH  . POCT glycosylated hemoglobin (Hb A1C)    8.  Return to clinic in 4-6 wks to see Dr WoJacelyn Grip Time spent counseling patient:  45 minutes  Referring provider:  WoJacelyn Grip

## 2013-06-19 ENCOUNTER — Ambulatory Visit: Payer: Federal, State, Local not specified - PPO | Attending: Neurosurgery | Admitting: Physical Therapy

## 2013-06-19 DIAGNOSIS — R5381 Other malaise: Secondary | ICD-10-CM | POA: Diagnosis not present

## 2013-06-19 DIAGNOSIS — R293 Abnormal posture: Secondary | ICD-10-CM | POA: Diagnosis not present

## 2013-06-19 DIAGNOSIS — M545 Low back pain, unspecified: Secondary | ICD-10-CM | POA: Diagnosis not present

## 2013-06-19 DIAGNOSIS — IMO0001 Reserved for inherently not codable concepts without codable children: Secondary | ICD-10-CM | POA: Diagnosis present

## 2013-06-20 LAB — NMR, LIPOPROFILE
CHOLESTEROL: 170 mg/dL (ref <200)
HDL CHOLESTEROL BY NMR: 36 mg/dL — AB (ref >=40)
HDL PARTICLE NUMBER: 24.7 umol/L — AB (ref >=30.5)
LDL Particle Number: 1326 nmol/L — ABNORMAL HIGH (ref <1000)
LDL Size: 20 nm — ABNORMAL LOW (ref >20.5)
LDLC SERPL CALC-MCNC: 58 mg/dL (ref <100)
LP-IR Score: 76 — ABNORMAL HIGH (ref <=45)
Small LDL Particle Number: 994 nmol/L — ABNORMAL HIGH (ref <=527)
TRIGLYCERIDES BY NMR: 379 mg/dL — AB (ref <150)

## 2013-06-20 LAB — THYROID PANEL WITH TSH
Free Thyroxine Index: 1.7 (ref 1.2–4.9)
T3 UPTAKE RATIO: 26 % (ref 24–39)
T4, Total: 6.6 ug/dL (ref 4.5–12.0)
TSH: 3.24 u[IU]/mL (ref 0.450–4.500)

## 2013-06-20 LAB — CMP14+EGFR
A/G RATIO: 1.6 (ref 1.1–2.5)
ALK PHOS: 64 IU/L (ref 39–117)
ALT: 13 IU/L (ref 0–32)
AST: 22 IU/L (ref 0–40)
Albumin: 4.1 g/dL (ref 3.5–4.8)
BILIRUBIN TOTAL: 0.4 mg/dL (ref 0.0–1.2)
BUN / CREAT RATIO: 13 (ref 11–26)
BUN: 11 mg/dL (ref 8–27)
CO2: 25 mmol/L (ref 18–29)
Calcium: 9.2 mg/dL (ref 8.7–10.3)
Chloride: 100 mmol/L (ref 97–108)
Creatinine, Ser: 0.84 mg/dL (ref 0.57–1.00)
GFR, EST AFRICAN AMERICAN: 77 mL/min/{1.73_m2} (ref >59)
GFR, EST NON AFRICAN AMERICAN: 67 mL/min/{1.73_m2} (ref >59)
Globulin, Total: 2.5 g/dL (ref 1.5–4.5)
Glucose: 215 mg/dL — ABNORMAL HIGH (ref 65–99)
POTASSIUM: 5.2 mmol/L (ref 3.5–5.2)
Sodium: 141 mmol/L (ref 134–144)
TOTAL PROTEIN: 6.6 g/dL (ref 6.0–8.5)

## 2013-06-20 LAB — VITAMIN D 25 HYDROXY (VIT D DEFICIENCY, FRACTURES): Vit D, 25-Hydroxy: 12.8 ng/mL — ABNORMAL LOW (ref 30.0–100.0)

## 2013-06-23 ENCOUNTER — Ambulatory Visit: Payer: Federal, State, Local not specified - PPO | Admitting: Physical Therapy

## 2013-06-23 DIAGNOSIS — M545 Low back pain, unspecified: Secondary | ICD-10-CM | POA: Diagnosis not present

## 2013-06-25 ENCOUNTER — Telehealth: Payer: Self-pay | Admitting: Pharmacist

## 2013-06-25 ENCOUNTER — Ambulatory Visit: Payer: Federal, State, Local not specified - PPO | Attending: Neurosurgery | Admitting: Physical Therapy

## 2013-06-25 DIAGNOSIS — R293 Abnormal posture: Secondary | ICD-10-CM | POA: Diagnosis not present

## 2013-06-25 DIAGNOSIS — R5381 Other malaise: Secondary | ICD-10-CM | POA: Diagnosis not present

## 2013-06-25 DIAGNOSIS — E559 Vitamin D deficiency, unspecified: Secondary | ICD-10-CM

## 2013-06-25 DIAGNOSIS — M545 Low back pain, unspecified: Secondary | ICD-10-CM | POA: Diagnosis not present

## 2013-06-25 DIAGNOSIS — IMO0001 Reserved for inherently not codable concepts without codable children: Secondary | ICD-10-CM | POA: Diagnosis present

## 2013-06-25 NOTE — Telephone Encounter (Signed)
Tried to call to discuss labs results - left message for patient to call office.

## 2013-06-29 ENCOUNTER — Other Ambulatory Visit: Payer: Self-pay | Admitting: Family Medicine

## 2013-07-01 ENCOUNTER — Ambulatory Visit (INDEPENDENT_AMBULATORY_CARE_PROVIDER_SITE_OTHER): Payer: Federal, State, Local not specified - PPO | Admitting: Nurse Practitioner

## 2013-07-01 ENCOUNTER — Encounter: Payer: Federal, State, Local not specified - PPO | Admitting: Physical Therapy

## 2013-07-01 ENCOUNTER — Encounter: Payer: Self-pay | Admitting: Nurse Practitioner

## 2013-07-01 VITALS — BP 160/71 | HR 60 | Temp 98.2°F | Ht 60.5 in | Wt 182.8 lb

## 2013-07-01 DIAGNOSIS — J208 Acute bronchitis due to other specified organisms: Secondary | ICD-10-CM

## 2013-07-01 DIAGNOSIS — M940 Chondrocostal junction syndrome [Tietze]: Secondary | ICD-10-CM

## 2013-07-01 DIAGNOSIS — J209 Acute bronchitis, unspecified: Secondary | ICD-10-CM

## 2013-07-01 DIAGNOSIS — R079 Chest pain, unspecified: Secondary | ICD-10-CM

## 2013-07-01 MED ORDER — HYDROCODONE-HOMATROPINE 5-1.5 MG/5ML PO SYRP: 5.0000 mL | ORAL_SOLUTION | Freq: Three times a day (TID) | ORAL | Status: DC | PRN

## 2013-07-01 MED ORDER — MECLIZINE HCL 25 MG PO TABS: 25.0000 mg | ORAL_TABLET | Freq: Three times a day (TID) | ORAL | Status: DC | PRN

## 2013-07-01 NOTE — Progress Notes (Signed)
Subjective:    Patient ID: Leah Levine, female    DOB: 23-Jun-1932, 80 y.o.   MRN: [id number]  HPI Patient presents today complaining of sore throat, cough, and congestion x 2 days. Denies fever, chills, and wheezing. No treatment. Patient is also experiencing chest pain. Pain is constant, described as aching, and 8/10. Does not radiate. Recently saw cardiologist and received good report.  Review of Systems  Constitutional: Negative for fever and chills.  HENT: Positive for congestion, rhinorrhea and sore throat. Negative for sinus pressure.   Respiratory: Positive for cough. Negative for shortness of breath and wheezing.   Cardiovascular: Positive for chest pain.  Gastrointestinal: Negative for nausea.  Neurological: Negative for headaches.  All other systems reviewed and are negative.      Objective:   Physical Exam  Constitutional: She is oriented to person, place, and time. She appears well-developed and well-nourished.  HENT:  Right Ear: External ear normal.  Left Ear: External ear normal.  Nose: Mucosal edema present. Right sinus exhibits no maxillary sinus tenderness and no frontal sinus tenderness. Left sinus exhibits no maxillary sinus tenderness and no frontal sinus tenderness.  Mouth/Throat: Oropharynx is clear and moist.  Eyes: Pupils are equal, round, and reactive to light.  Neck: Normal range of motion. Neck supple.  Cardiovascular: Normal rate, regular rhythm and normal heart sounds.   Chest pain reproducible with palpation  Pulmonary/Chest: Effort normal and breath sounds normal.  Lymphadenopathy:    She has no cervical adenopathy.  Neurological: She is alert and oriented to person, place, and time.  Skin: Skin is warm and dry.  Psychiatric: She has a normal mood and affect. Her behavior is normal. Judgment and thought content normal.   BP 160/71  Pulse 60  Temp(Src) 98.2 F (36.8 C) (Oral)  Ht 5' 0.5" (1.537 m)  Wt 182 lb 12.8 oz (82.918 kg)   BMI 35.10 kg/m2   EKG: NSR Preliminary reading by Ronnald Collum, FNP  [facility]      Assessment & Plan:   1. Costochondritis   2. Chest pain   3. Viral bronchitis    Meds ordered this encounter  Medications  . HYDROcodone-homatropine (HYCODAN) 5-1.5 MG/5ML syrup    Sig: Take 5 mLs by mouth every 8 (eight) hours as needed for cough.    Dispense:  120 mL    Refill:  0    Order Specific Question:  Supervising Provider    Answer:  Chipper Herb [[id number]]  . meclizine (ANTIVERT) 25 MG tablet    Sig: Take 1 tablet (25 mg total) by mouth 3 (three) times daily as needed for dizziness.    Dispense:  30 tablet    Refill:  0    Order Specific Question:  Supervising Provider    Answer:  Chipper Herb [[id number]]   Orders Placed This Encounter  Procedures  . EKG 12-Lead   1. Take meds as prescribed 2. Use a cool mist humidifier especially during the winter months and when heat has been humid. 3. Use saline nose sprays frequently 4. Saline irrigations of the nose can be very helpful if done frequently.  * 4X daily for 1 week*  * Use of a nettie pot can be helpful with this. Follow directions with this* 5. Drink plenty of fluids 6. Keep thermostat turn down low 7.For any cough or congestion  Use plain Mucinex- regular strength or max strength is fine   * Children- consult with Pharmacist for dosing 8. For  fever or aces or pains- take tylenol or ibuprofen appropriate for age and weight.  * for fevers greater than 101 orally you may alternate ibuprofen and tylenol every  3 hours. 9. Ibuprofen and ice PRN for chest wall inflammation  Mary-Margaret Hassell Done, FNP

## 2013-07-01 NOTE — Patient Instructions (Addendum)
1. Take meds as prescribed 2. Use a cool mist humidifier especially during the winter months and when heat has been humid. 3. Use saline nose sprays frequently 4. Saline irrigations of the nose can be very helpful if done frequently.  * 4X daily for 1 week*  * Use of a nettie pot can be helpful with this. Follow directions with this* 5. Drink plenty of fluids 6. Keep thermostat turn down low 7.For any cough or congestion  Use plain Mucinex- regular strength or max strength is fine   * Children- consult with Pharmacist for dosing 8. For fever or aces or pains- take tylenol or ibuprofen appropriate for age and weight.  * for fevers greater than 101 orally you may alternate ibuprofen and tylenol every  3 hours.    Costochondritis Costochondritis, sometimes called Tietze syndrome, is a swelling and irritation (inflammation) of the tissue (cartilage) that connects your ribs with your breastbone (sternum). It causes pain in the chest and rib area. Costochondritis usually goes away on its own over time. It can take up to 6 weeks or longer to get better, especially if you are unable to limit your activities. CAUSES  Some cases of costochondritis have no known cause. Possible causes include:  Injury (trauma).  Exercise or activity such as lifting.  Severe coughing. SIGNS AND SYMPTOMS  Pain and tenderness in the chest and rib area.  Pain that gets worse when coughing or taking deep breaths.  Pain that gets worse with specific movements. DIAGNOSIS  Your health care provider will do a physical exam and ask about your symptoms. Chest X-rays or other tests may be done to rule out other problems. TREATMENT  Costochondritis usually goes away on its own over time. Your health care provider may prescribe medicine to help relieve pain. HOME CARE INSTRUCTIONS   Avoid exhausting physical activity. Try not to strain your ribs during normal activity. This would include any activities using chest,  abdominal, and side muscles, especially if heavy weights are used.  Apply ice to the affected area for the first 2 days after the pain begins.  Put ice in a plastic bag.  Place a towel between your skin and the bag.  Leave the ice on for 20 minutes, 2 3 times a day.  Only take over-the-counter or prescription medicines as directed by your health care provider. SEEK MEDICAL CARE IF:  You have redness or swelling at the rib joints. These are signs of infection.  Your pain does not go away despite rest or medicine. SEEK IMMEDIATE MEDICAL CARE IF:   Your pain increases or you are very uncomfortable.  You have shortness of breath or difficulty breathing.  You cough up blood.  You have worse chest pains, sweating, or vomiting.  You have a fever or persistent symptoms for more than 2 3 days.  You have a fever and your symptoms suddenly get worse. MAKE SURE YOU:   Understand these instructions.  Will watch your condition.  Will get help right away if you are not doing well or get worse. Document Released: 12/20/2004 Document Revised: 12/31/2012 Document Reviewed: 10/14/2012 [facility] Patient Information 2014 [place].

## 2013-07-02 ENCOUNTER — Ambulatory Visit: Payer: Federal, State, Local not specified - PPO | Admitting: Physical Therapy

## 2013-07-02 DIAGNOSIS — M545 Low back pain, unspecified: Secondary | ICD-10-CM | POA: Diagnosis not present

## 2013-07-03 NOTE — Telephone Encounter (Signed)
Call patient : Prescription refilled & sent to pharmacy in [place]. 

## 2013-07-06 MED ORDER — VITAMIN D (ERGOCALCIFEROL) 1.25 MG (50000 UNIT) PO CAPS: 50000.0000 [IU] | ORAL_CAPSULE | ORAL | Status: DC

## 2013-07-07 ENCOUNTER — Encounter: Payer: Federal, State, Local not specified - PPO | Admitting: *Deleted

## 2013-07-08 ENCOUNTER — Telehealth: Payer: Self-pay | Admitting: Nurse Practitioner

## 2013-07-09 ENCOUNTER — Ambulatory Visit: Payer: Federal, State, Local not specified - PPO | Admitting: Physical Therapy

## 2013-07-09 DIAGNOSIS — M545 Low back pain, unspecified: Secondary | ICD-10-CM | POA: Diagnosis not present

## 2013-07-09 NOTE — Telephone Encounter (Signed)
Left message for her to return call

## 2013-07-09 NOTE — Telephone Encounter (Signed)
Left message, should not need a refill. Script was given on 07-01-13.

## 2013-07-09 NOTE — Telephone Encounter (Signed)
Was just filled on 07/01/13- is she out?

## 2013-07-14 ENCOUNTER — Encounter: Payer: Federal, State, Local not specified - PPO | Admitting: Physical Therapy

## 2013-07-15 DIAGNOSIS — Z029 Encounter for administrative examinations, unspecified: Secondary | ICD-10-CM

## 2013-07-16 ENCOUNTER — Ambulatory Visit: Payer: Federal, State, Local not specified - PPO | Admitting: Physical Therapy

## 2013-07-16 DIAGNOSIS — M545 Low back pain, unspecified: Secondary | ICD-10-CM | POA: Diagnosis not present

## 2013-07-20 ENCOUNTER — Telehealth: Payer: Self-pay | Admitting: Pharmacist

## 2013-07-20 MED ORDER — MIRABEGRON ER 25 MG PO TB24: 25.0000 mg | ORAL_TABLET | Freq: Every day | ORAL | Status: DC

## 2013-07-20 NOTE — Telephone Encounter (Signed)
Left #21 samples of Mtrbetriq 25mg  at the front desk Lot# [id number]/  Exp: 10/2015

## 2013-07-21 ENCOUNTER — Encounter: Payer: Self-pay | Admitting: Family Medicine

## 2013-07-21 ENCOUNTER — Ambulatory Visit (INDEPENDENT_AMBULATORY_CARE_PROVIDER_SITE_OTHER): Payer: Federal, State, Local not specified - PPO | Admitting: Family Medicine

## 2013-07-21 ENCOUNTER — Ambulatory Visit: Payer: Federal, State, Local not specified - PPO | Admitting: Physical Therapy

## 2013-07-21 VITALS — BP 132/82 | HR 57 | Temp 97.1°F | Ht 60.0 in | Wt 182.0 lb

## 2013-07-21 DIAGNOSIS — E119 Type 2 diabetes mellitus without complications: Secondary | ICD-10-CM

## 2013-07-21 DIAGNOSIS — Z860100 Personal history of colon polyps, unspecified: Secondary | ICD-10-CM

## 2013-07-21 DIAGNOSIS — E785 Hyperlipidemia, unspecified: Secondary | ICD-10-CM

## 2013-07-21 DIAGNOSIS — E538 Deficiency of other specified B group vitamins: Secondary | ICD-10-CM

## 2013-07-21 DIAGNOSIS — I10 Essential (primary) hypertension: Secondary | ICD-10-CM

## 2013-07-21 DIAGNOSIS — K219 Gastro-esophageal reflux disease without esophagitis: Secondary | ICD-10-CM

## 2013-07-21 DIAGNOSIS — J309 Allergic rhinitis, unspecified: Secondary | ICD-10-CM

## 2013-07-21 DIAGNOSIS — H8110 Benign paroxysmal vertigo, unspecified ear: Secondary | ICD-10-CM

## 2013-07-21 DIAGNOSIS — E039 Hypothyroidism, unspecified: Secondary | ICD-10-CM

## 2013-07-21 DIAGNOSIS — M545 Low back pain, unspecified: Secondary | ICD-10-CM | POA: Diagnosis not present

## 2013-07-21 DIAGNOSIS — R251 Tremor, unspecified: Secondary | ICD-10-CM

## 2013-07-21 DIAGNOSIS — R259 Unspecified abnormal involuntary movements: Secondary | ICD-10-CM

## 2013-07-21 DIAGNOSIS — Z951 Presence of aortocoronary bypass graft: Secondary | ICD-10-CM

## 2013-07-21 MED ORDER — MECLIZINE HCL 25 MG PO TABS: ORAL_TABLET | ORAL | Status: DC

## 2013-07-21 NOTE — Patient Instructions (Signed)
DASH Diet The DASH diet stands for "Dietary Approaches to Stop Hypertension." It is a healthy eating plan that has been shown to reduce high blood pressure (hypertension) in as little as 14 days, while also possibly providing other significant health benefits. These other health benefits include reducing the risk of breast cancer after menopause and reducing the risk of type 2 diabetes, heart disease, colon cancer, and stroke. Health benefits also include weight loss and slowing kidney failure in patients with chronic kidney disease.  DIET GUIDELINES  Limit salt (sodium). Your diet should contain less than 1500 mg of sodium daily.  Limit refined or processed carbohydrates. Your diet should include mostly whole grains. Desserts and added sugars should be used sparingly.  Include small amounts of heart-healthy fats. These types of fats include nuts, oils, and tub margarine. Limit saturated and trans fats. These fats have been shown to be harmful in the body. CHOOSING FOODS  The following food groups are based on a 2000 calorie diet. See your Registered Dietitian for individual calorie needs. Grains and Grain Products (6 to 8 servings daily)  Eat More Often: Whole-wheat bread, brown rice, whole-grain or wheat pasta, quinoa, popcorn without added fat or salt (air popped).  Eat Less Often: White bread, white pasta, white rice, cornbread. Vegetables (4 to 5 servings daily)  Eat More Often: Fresh, frozen, and canned vegetables. Vegetables may be raw, steamed, roasted, or grilled with a minimal amount of fat.  Eat Less Often/Avoid: Creamed or fried vegetables. Vegetables in a cheese sauce. Fruit (4 to 5 servings daily)  Eat More Often: All fresh, canned (in natural juice), or frozen fruits. Dried fruits without added sugar. One hundred percent fruit juice ( cup [237 mL] daily).  Eat Less Often: Dried fruits with added sugar. Canned fruit in light or heavy syrup. [employer], Fish, and Poultry (2  servings or less daily. One serving is 3 to 4 oz [85-114 g]).  Eat More Often: Ninety percent or leaner ground beef, tenderloin, sirloin. Round cuts of beef, chicken breast, [place] breast. All fish. Grill, bake, or broil your meat. Nothing should be fried.  Eat Less Often/Avoid: Fatty cuts of meat, [place], or chicken leg, thigh, or wing. Fried cuts of meat or fish. Dairy (2 to 3 servings)  Eat More Often: Low-fat or fat-free milk, low-fat plain or light yogurt, reduced-fat or part-skim cheese.  Eat Less Often/Avoid: Milk (whole, 2%).Whole milk yogurt. Full-fat cheeses. Nuts, Seeds, and Legumes (4 to 5 servings per week)  Eat More Often: All without added salt.  Eat Less Often/Avoid: Salted nuts and seeds, canned beans with added salt. Fats and Sweets (limited)  Eat More Often: Vegetable oils, tub margarines without trans fats, sugar-free gelatin. Mayonnaise and salad dressings.  Eat Less Often/Avoid: Coconut oils, palm oils, butter, stick margarine, cream, half and half, cookies, candy, pie. FOR MORE INFORMATION The Dash Diet Eating Plan: www.dashdiet.org Document Released: 03/01/2011 Document Revised: 06/04/2011 Document Reviewed: 03/01/2011 [facility] Patient Information 2014 [place], [place].    Back Pain, Adult Low back pain is very common. About 1 in 5 people have back pain.The cause of low back pain is rarely dangerous. The pain often gets better over time.About half of people with a sudden onset of back pain feel better in just 2 weeks. About 8 in 10 people feel better by 6 weeks.  CAUSES Some common causes of back pain include:  Strain of the muscles or ligaments supporting the spine.  Wear and tear (degeneration) of the spinal  discs.  Arthritis.  Direct injury to the back. DIAGNOSIS Most of the time, the direct cause of low back pain is not known.However, back pain can be treated effectively even when the exact cause of the pain is unknown.Answering your caregiver's  questions about your overall health and symptoms is one of the most accurate ways to make sure the cause of your pain is not dangerous. If your caregiver needs more information, he or she may order lab work or imaging tests (X-rays or MRIs).However, even if imaging tests show changes in your back, this usually does not require surgery. HOME CARE INSTRUCTIONS For many people, back pain returns.Since low back pain is rarely dangerous, it is often a condition that people can learn to [facility] their own.   Remain active. It is stressful on the back to sit or stand in one place. Do not sit, drive, or stand in one place for more than 30 minutes at a time. Take short walks on level surfaces as soon as pain allows.Try to increase the length of time you walk each day.  Do not stay in bed.Resting more than 1 or 2 days can delay your recovery.  Do not avoid exercise or work.Your body is made to move.It is not dangerous to be active, even though your back may hurt.Your back will likely heal faster if you return to being active before your pain is gone.  Pay attention to your body when you bend and lift. Many people have less discomfortwhen lifting if they bend their knees, keep the load close to their bodies,and avoid twisting. Often, the most comfortable positions are those that put less stress on your recovering back.  Find a comfortable position to sleep. Use a firm mattress and lie on your side with your knees slightly bent. If you lie on your back, put a pillow under your knees.  Only take over-the-counter or prescription medicines as directed by your caregiver. Over-the-counter medicines to reduce pain and inflammation are often the most helpful.Your caregiver may prescribe muscle relaxant drugs.These medicines help dull your pain so you can more quickly return to your normal activities and healthy exercise.  Put ice on the injured area.  Put ice in a plastic bag.  Place a towel between  your skin and the bag.  Leave the ice on for 15-20 minutes, 03-04 times a day for the first 2 to 3 days. After that, ice and heat may be alternated to reduce pain and spasms.  Ask your caregiver about trying back exercises and gentle massage. This may be of some benefit.  Avoid feeling anxious or stressed.Stress increases muscle tension and can worsen back pain.It is important to recognize when you are anxious or stressed and learn ways to manage it.Exercise is a great option. SEEK MEDICAL CARE IF:  You have pain that is not relieved with rest or medicine.  You have pain that does not improve in 1 week.  You have new symptoms.  You are generally not feeling well. SEEK IMMEDIATE MEDICAL CARE IF:   You have pain that radiates from your back into your legs.  You develop new bowel or bladder control problems.  You have unusual weakness or numbness in your arms or legs.  You develop nausea or vomiting.  You develop abdominal pain.  You feel faint. Document Released: 03/12/2005 Document Revised: 09/11/2011 Document Reviewed: 07/31/2010 [facility] Patient Information 2014 [place], [place].   Diabetes and Foot Care Diabetes may cause you to have problems because of poor  blood supply (circulation) to your feet and legs. This may cause the skin on your feet to become thinner, break easier, and heal more slowly. Your skin may become dry, and the skin may peel and crack. You may also have nerve damage in your legs and feet causing decreased feeling in them. You may not notice minor injuries to your feet that could lead to infections or more serious problems. Taking care of your feet is one of the most important things you can do for yourself.  HOME CARE INSTRUCTIONS  Wear shoes at all times, even in the house. Do not go barefoot. Bare feet are easily injured.  Check your feet daily for blisters, cuts, and redness. If you cannot see the bottom of your feet, use a mirror or ask someone for  help.  Wash your feet with warm water (do not use hot water) and mild soap. Then pat your feet and the areas between your toes until they are completely dry. Do not soak your feet as this can dry your skin.  Apply a moisturizing lotion or petroleum jelly (that does not contain alcohol and is unscented) to the skin on your feet and to dry, brittle toenails. Do not apply lotion between your toes.  Trim your toenails straight across. Do not dig under them or around the cuticle. File the edges of your nails with an emery board or nail file.  Do not cut corns or calluses or try to remove them with medicine.  Wear clean socks or stockings every day. Make sure they are not too tight. Do not wear knee-high stockings since they may decrease blood flow to your legs.  Wear shoes that fit properly and have enough cushioning. To break in new shoes, wear them for just a few hours a day. This prevents you from injuring your feet. Always look in your shoes before you put them on to be sure there are no objects inside.  Do not cross your legs. This may decrease the blood flow to your feet.  If you find a minor scrape, cut, or break in the skin on your feet, keep it and the skin around it clean and dry. These areas may be cleansed with mild soap and water. Do not cleanse the area with peroxide, alcohol, or iodine.  When you remove an adhesive bandage, be sure not to damage the skin around it.  If you have a wound, look at it several times a day to make sure it is healing.  Do not use heating pads or hot water bottles. They may burn your skin. If you have lost feeling in your feet or legs, you may not know it is happening until it is too late.  Make sure your health care provider performs a complete foot exam at least annually or more often if you have foot problems. Report any cuts, sores, or bruises to your health care provider immediately. SEEK MEDICAL CARE IF:   You have an injury that is not  healing.  You have cuts or breaks in the skin.  You have an ingrown nail.  You notice redness on your legs or feet.  You feel burning or tingling in your legs or feet.  You have pain or cramps in your legs and feet.  Your legs or feet are numb.  Your feet always feel cold. SEEK IMMEDIATE MEDICAL CARE IF:   There is increasing redness, swelling, or pain in or around a wound.  There is a red  line that goes up your leg.  Pus is coming from a wound.  You develop a fever or as directed by your health care provider.  You notice a bad smell coming from an ulcer or wound. Document Released: 03/09/2000 Document Revised: 11/12/2012 Document Reviewed: 08/19/2012 [facility] Patient Information 2014 [place].   Diabetes and Exercise Exercising regularly is important. It is not just about losing weight. It has many health benefits, such as:  Improving your overall fitness, flexibility, and endurance.  Increasing your bone density.  Helping with weight control.  Decreasing your body fat.  Increasing your muscle strength.  Reducing stress and tension.  Improving your overall health. People with diabetes who exercise gain additional benefits because exercise:  Reduces appetite.  Improves the body's use of blood sugar (glucose).  Helps lower or control blood glucose.  Decreases blood pressure.  Helps control blood lipids (such as cholesterol and triglycerides).  Improves the body's use of the hormone insulin by:  Increasing the body's insulin sensitivity.  Reducing the body's insulin needs.  Decreases the risk for heart disease because exercising:  Lowers cholesterol and triglycerides levels.  Increases the levels of good cholesterol (such as high-density lipoproteins [HDL]) in the body.  Lowers blood glucose levels. YOUR ACTIVITY PLAN  Choose an activity that you enjoy and set realistic goals. Your health care provider or diabetes educator can help you make  an activity plan that works for you. You can break activities into 2 or 3 sessions throughout the day. Doing so is as good as one long session. Exercise ideas include:  Taking the dog for a walk.  Taking the stairs instead of the elevator.  Dancing to your favorite song.  Doing your favorite exercise with a friend. RECOMMENDATIONS FOR EXERCISING WITH TYPE 1 OR TYPE 2 DIABETES   Check your blood glucose before exercising. If blood glucose levels are greater than 240 mg/dL, check for urine ketones. Do not exercise if ketones are present.  Avoid injecting insulin into areas of the body that are going to be exercised. For example, avoid injecting insulin into:  The arms when playing tennis.  The legs when jogging.  Keep a record of:  Food intake before and after you exercise.  Expected peak times of insulin action.  Blood glucose levels before and after you exercise.  The type and amount of exercise you have done.  Review your records with your health care provider. Your health care provider will help you to develop guidelines for adjusting food intake and insulin amounts before and after exercising.  If you take insulin or oral hypoglycemic agents, watch for signs and symptoms of hypoglycemia. They include:  Dizziness.  Shaking.  Sweating.  Chills.  Confusion.  Drink plenty of water while you exercise to prevent dehydration or heat stroke. Body water is lost during exercise and must be replaced.  Talk to your health care provider before starting an exercise program to make sure it is safe for you. Remember, almost any type of activity is better than none. Document Released: 06/02/2003 Document Revised: 11/12/2012 Document Reviewed: 08/19/2012 [facility] Patient Information 2014 [place].

## 2013-07-21 NOTE — Progress Notes (Signed)
Patient ID: Leah Levine, female   DOB: Mar 30, 1932, 80 y.o.   MRN: [id number] SUBJECTIVE: CC: Chief Complaint  Patient presents with  . Follow-up    3 MONTH FOLLOW UP CHRONIC PROBLEMS  refill meclizine    HPI: Bronchitis and laryngitis is better. Almost gone  Patient is here for follow up of Diabetes Mellitus/CAD/HTN/HLD: Symptoms evaluated: Denies Nocturia ,Denies Urinary Frequency , denies Blurred vision ,deniesDizziness,denies.Dysuria,denies paresthesias, denies extremity pain or ulcers.Marland Kitchendenies chest pain. has had an annual eye exam. do check the feet. Does check CBGs. Average CBG:110 Denies episodes of hypoglycemia. Does have an emergency hypoglycemic plan. admits toCompliance with medications. Denies Problems with medications.  Sees Dr Jeral Fruit neuro surgeon for back pain. Sees the N/S for back /spinal injections. Back is flaring up again.  Past Medical History  Diagnosis Date  . Tremor   . GERD (gastroesophageal reflux disease)   . Hyperlipidemia   . Allergy   . Urinary incontinence   . Diabetes mellitus type 2, controlled   . Hypertension     patient denies ever having hypertension  . Personal history of colonic polyps 11/20/2010    tubular adenomas  . Hypothyroidism   . Anemia   . Skin cancer     ear, right side of cheek   Past Surgical History  Procedure Laterality Date  . Appendectomy    . Cholecystectomy    . Abdominal hysterectomy    . Knee arthroscopy      bilateral  . Carpal tunnel release    . Back surgery    . Gallbladder surgery    . Knees Bilateral   . Coronary artery bypass graft N/A 11/26/2012    Procedure: CORONARY ARTERY BYPASS GRAFTING (CABG);  Surgeon: Delight Ovens, MD;  Location: [facility] OR;  Service: Open Heart Surgery;  Laterality: N/A;  . Intraoperative transesophageal echocardiogram N/A 11/26/2012    Procedure: INTRAOPERATIVE TRANSESOPHAGEAL ECHOCARDIOGRAM;  Surgeon: Delight Ovens, MD;  Location: [facility] OR;  Service: Open Heart  Surgery;  Laterality: N/A;   History   Social History  . Marital Status: Widowed    Spouse Name: Onalee Hua    Number of Children: 2  . Years of Education: 11   Occupational History  . Retired    Social History Main Topics  . Smoking status: Never Smoker   . Smokeless tobacco: Never Used  . Alcohol Use: No  . Drug Use: No  . Sexual Activity: Not on file   Other Topics Concern  . Not on file   Social History Narrative   Lives in [place] with her grandchildren.  She is widowed.  Her husband died in 27-Feb-2009.     Family History  Problem Relation Age of Onset  . Ovarian cancer Mother   . Diabetes Father   . Pneumonia Father   . Diabetes Sister    Current Outpatient Prescriptions on File Prior to Visit  Medication Sig Dispense Refill  . aspirin EC 325 MG EC tablet Take 1 tablet (325 mg total) by mouth daily.  30 tablet  0  . atorvastatin (LIPITOR) 80 MG tablet Take 80 mg by mouth every evening.       . ferrous sulfate 325 (65 FE) MG tablet Take 1 tablet (325 mg total) by mouth daily with breakfast. For one month then stop  30 tablet  0  . folic acid (FOLVITE) 1 MG tablet Take 1 tablet (1 mg total) by mouth daily. For one month then stop.      Marland Kitchen  levothyroxine (SYNTHROID, LEVOTHROID) 100 MCG tablet Take 100 mcg by mouth daily before breakfast.      . lisinopril (PRINIVIL,ZESTRIL) 5 MG tablet Take 5 mg by mouth daily.      . mirabegron ER (MYRBETRIQ) 25 MG TB24 tablet Take 1 tablet (25 mg total) by mouth daily.  21 tablet  0  . propranolol (INDERAL) 60 MG tablet TAKE 1 TABLET BY MOUTH TWICE DAILY  180 tablet  0  . senna (SENOKOT) 8.6 MG tablet Take 2 tablets by mouth at bedtime as needed for constipation.      . sitaGLIPtin-metformin (JANUMET) 50-1000 MG per tablet Take 1 tablet by mouth 2 (two) times daily with a meal.  60 tablet  1  . Vitamin D, Ergocalciferol, (DRISDOL) 50000 UNITS CAPS capsule Take 1 capsule (50,000 Units total) by mouth every 7 (seven) days.  12 capsule  0  .  HYDROcodone-homatropine (HYCODAN) 5-1.5 MG/5ML syrup Take 5 mLs by mouth every 8 (eight) hours as needed for cough.  120 mL  0   No current facility-administered medications on file prior to visit.   Allergies  Allergen Reactions  . Celebrex [Celecoxib] Other (See Comments)    Muscle aches and leg pains  . Crestor [Rosuvastatin Calcium] Other (See Comments)    Muscle aches and leg pains  . Fenofibrate Other (See Comments)    Muscle aches and leg pains  . Lipitor [Atorvastatin Calcium] Other (See Comments)    Muscle aches and leg pains  . Simvastatin Other (See Comments)    Muscle aches and leg pains   Immunization History  Administered Date(s) Administered  . Pneumococcal Conjugate-13 03/27/1999  . Td 09/29/2008   Prior to Admission medications   Medication Sig Start Date End Date Taking? Authorizing Provider  aspirin EC 325 MG EC tablet Take 1 tablet (325 mg total) by mouth daily. 12/02/12  Yes [name] Margaretann Loveless, PA-C  atorvastatin (LIPITOR) 80 MG tablet Take 80 mg by mouth every evening.  02/05/13  Yes Historical Provider, MD  ferrous sulfate 325 (65 FE) MG tablet Take 1 tablet (325 mg total) by mouth daily with breakfast. For one month then stop 12/02/12  Yes [name] Margaretann Loveless, PA-C  folic acid (FOLVITE) 1 MG tablet Take 1 tablet (1 mg total) by mouth daily. For one month then stop. 12/02/12  Yes [name] Margaretann Loveless, PA-C  levothyroxine (SYNTHROID, LEVOTHROID) 100 MCG tablet Take 100 mcg by mouth daily before breakfast.   Yes Historical Provider, MD  lisinopril (PRINIVIL,ZESTRIL) 5 MG tablet Take 5 mg by mouth daily.   Yes Historical Provider, MD  meclizine (ANTIVERT) 25 MG tablet TAKE 1 TABLET (25 MG TOTAL) BY MOUTH 3 (THREE) TIMES DAILY AS NEEDED FOR DIZZINESS.   Yes Ileana Ladd, MD  mirabegron ER (MYRBETRIQ) 25 MG TB24 tablet Take 1 tablet (25 mg total) by mouth daily. 07/20/13  Yes Tammy Eckard, PHARMD  propranolol (INDERAL) 60 MG tablet TAKE 1 TABLET BY MOUTH TWICE DAILY  03/10/13  Yes Ileana Ladd, MD  senna (SENOKOT) 8.6 MG tablet Take 2 tablets by mouth at bedtime as needed for constipation.   Yes Historical Provider, MD  sitaGLIPtin-metformin (JANUMET) 50-1000 MG per tablet Take 1 tablet by mouth 2 (two) times daily with a meal. 06/18/13  Yes Tammy Eckard, PHARMD  Vitamin D, Ergocalciferol, (DRISDOL) 50000 UNITS CAPS capsule Take 1 capsule (50,000 Units total) by mouth every 7 (seven) days. 07/06/13  Yes Tammy Eckard, PHARMD  HYDROcodone-homatropine (HYCODAN) 5-1.5 MG/5ML syrup Take 5  mLs by mouth every 8 (eight) hours as needed for cough. 07/01/13   Mary-Margaret Daphine Deutscher, FNP     ROS: As above in the HPI. All other systems are stable or negative.  OBJECTIVE: APPEARANCE:  Patient in no acute distress.The patient appeared well nourished and normally developed. Acyanotic. Waist: VITAL SIGNS:BP 132/82  Pulse 57  Temp(Src) 97.1 F (36.2 C) (Oral)  Ht 5' (1.524 m)  Wt 182 lb (82.555 kg)  BMI 35.54 kg/m2 Obese WF  SKIN: warm and  Dry without overt rashes, tattoos and scars  HEAD and Neck: without JVD, Head and scalp: normal Eyes:No scleral icterus. Fundi normal, eye movements normal. Ears: Auricle normal, canal normal, Tympanic membranes normal, insufflation normal. Nose: normal Throat: normal Neck & thyroid: normal  CHEST & LUNGS: Chest wall: normal Lungs: Clear  CVS: Reveals the PMI to be normally located. Regular rhythm, First and Second Heart sounds are normal,  absence of murmurs, rubs or gallops. Peripheral vasculature: Radial pulses: normal Dorsal pedis pulses: normal Posterior pulses: normal  ABDOMEN:  Appearance: obese Benign, no organomegaly, no masses, no Abdominal Aortic enlargement. No Guarding , no rebound. No Bruits. Bowel sounds: normal  RECTAL: N/A GU: N/A  EXTREMETIES: nonedematous.  MUSCULOSKELETAL:  Spine: normal Joints: intact  NEUROLOGIC: oriented to time,place and person; nonfocal. Strength is  normal Sensory is normal Reflexes are normal Cranial Nerves are normal.   Results for orders placed in visit on 06/18/13  NMR, LIPOPROFILE      Result Value Ref Range   LDL Particle Number [id number] (*) <1000 nmol/L   LDLC SERPL CALC-MCNC 58  <100 mg/dL   HDL Cholesterol by NMR 36 (*) >=40 mg/dL   Triglycerides by NMR 379 (*) <150 mg/dL   Cholesterol 161  <096 mg/dL   HDL Particle Number 04.5 (*) >=30.5 umol/L   Small LDL Particle Number [id number] (*) <=527 nmol/L   LDL Size 20.0 (*) >20.5 nm   LP-IR Score 76 (*) <=45  CMP14+EGFR      Result Value Ref Range   Glucose 215 (*) 65 - 99 mg/dL   BUN 11  8 - 27 mg/dL   Creatinine, Ser 4.09  0.57 - 1.00 mg/dL   GFR calc non Af Amer 67  >59 mL/min/1.73   GFR calc Af Amer 77  >59 mL/min/1.73   BUN/Creatinine Ratio 13  11 - 26   Sodium 141  134 - 144 mmol/L   Potassium 5.2  3.5 - 5.2 mmol/L   Chloride 100  97 - 108 mmol/L   CO2 25  18 - 29 mmol/L   Calcium 9.2  8.7 - 10.3 mg/dL   Total Protein 6.6  6.0 - 8.5 g/dL   Albumin 4.1  3.5 - 4.8 g/dL   Globulin, Total 2.5  1.5 - 4.5 g/dL   Albumin/Globulin Ratio 1.6  1.1 - 2.5   Total Bilirubin 0.4  0.0 - 1.2 mg/dL   Alkaline Phosphatase 64  39 - 117 IU/L   AST 22  0 - 40 IU/L   ALT 13  0 - 32 IU/L  VITAMIN D 25 HYDROXY      Result Value Ref Range   Vit D, 25-Hydroxy 12.8 (*) 30.0 - 100.0 ng/mL  THYROID PANEL WITH TSH      Result Value Ref Range   TSH 3.240  0.450 - 4.500 uIU/mL   T4, Total 6.6  4.5 - 12.0 ug/dL   T3 Uptake Ratio 26  24 - 39 %   Free Thyroxine  Index 1.7  1.2 - 4.9  POCT GLYCOSYLATED HEMOGLOBIN (HGB A1C)      Result Value Ref Range   Hemoglobin A1C 8.1      ASSESSMENT: S/P CABG x 4  Hypertension  Hyperlipidemia  Diabetes mellitus type 2, controlled  Vitamin B12 deficiency  Tremor  Personal history of colonic polyps  Hypothyroidism  GERD (gastroesophageal reflux disease)  Allergic rhinitis  Benign paroxysmal positional vertigo - Plan: meclizine (ANTIVERT)  25 MG tablet  PLAN: Dash diet, back pain in the AVS.  Abdominal exercises to help with back pain.  Reviewed labs with patient.  Continue with same meds. Follow up with Tammy as planned.  No orders of the defined types were placed in this encounter.   Meds ordered this encounter  Medications  . meclizine (ANTIVERT) 25 MG tablet    Sig: TAKE 1 TABLET (25 MG TOTAL) BY MOUTH 3 (THREE) TIMES DAILY AS NEEDED FOR DIZZINESS.    Dispense:  30 tablet    Refill:  0   Medications Discontinued During This Encounter  Medication Reason  . propranolol (INDERAL) 10 MG tablet Change in therapy  . meclizine (ANTIVERT) 25 MG tablet Duplicate  . meclizine (ANTIVERT) 25 MG tablet Reorder   Return in about 3 months (around 10/20/2013) for Recheck medical problems.  Salley Boxley P. Modesto Charon, M.D.

## 2013-07-23 ENCOUNTER — Ambulatory Visit: Payer: Federal, State, Local not specified - PPO | Admitting: Physical Therapy

## 2013-07-23 DIAGNOSIS — M545 Low back pain, unspecified: Secondary | ICD-10-CM | POA: Diagnosis not present

## 2013-07-28 ENCOUNTER — Ambulatory Visit: Payer: Federal, State, Local not specified - PPO | Attending: Neurosurgery | Admitting: Physical Therapy

## 2013-07-28 DIAGNOSIS — R293 Abnormal posture: Secondary | ICD-10-CM | POA: Insufficient documentation

## 2013-07-28 DIAGNOSIS — M545 Low back pain, unspecified: Secondary | ICD-10-CM | POA: Insufficient documentation

## 2013-07-28 DIAGNOSIS — R5381 Other malaise: Secondary | ICD-10-CM | POA: Insufficient documentation

## 2013-07-30 ENCOUNTER — Ambulatory Visit: Payer: Federal, State, Local not specified - PPO | Admitting: Physical Therapy

## 2013-08-04 ENCOUNTER — Encounter: Payer: Federal, State, Local not specified - PPO | Admitting: Physical Therapy

## 2013-08-06 ENCOUNTER — Ambulatory Visit: Payer: Federal, State, Local not specified - PPO | Admitting: Physical Therapy

## 2013-08-10 ENCOUNTER — Emergency Department (HOSPITAL_COMMUNITY)
Admission: EM | Admit: 2013-08-10 | Discharge: 2013-08-10 | Disposition: A | Discharge status: Home / Self Care | Payer: Federal, State, Local not specified - PPO | Attending: Emergency Medicine | Admitting: Emergency Medicine

## 2013-08-10 ENCOUNTER — Telehealth: Payer: Self-pay | Admitting: Cardiology

## 2013-08-10 ENCOUNTER — Telehealth: Payer: Self-pay | Admitting: *Deleted

## 2013-08-10 ENCOUNTER — Other Ambulatory Visit: Payer: Self-pay

## 2013-08-10 ENCOUNTER — Emergency Department (HOSPITAL_COMMUNITY): Payer: Federal, State, Local not specified - PPO

## 2013-08-10 ENCOUNTER — Encounter (HOSPITAL_COMMUNITY): Payer: Self-pay | Admitting: Emergency Medicine

## 2013-08-10 DIAGNOSIS — I10 Essential (primary) hypertension: Secondary | ICD-10-CM | POA: Insufficient documentation

## 2013-08-10 DIAGNOSIS — Z951 Presence of aortocoronary bypass graft: Secondary | ICD-10-CM | POA: Insufficient documentation

## 2013-08-10 DIAGNOSIS — E119 Type 2 diabetes mellitus without complications: Secondary | ICD-10-CM | POA: Insufficient documentation

## 2013-08-10 DIAGNOSIS — R0789 Other chest pain: Secondary | ICD-10-CM

## 2013-08-10 DIAGNOSIS — R079 Chest pain, unspecified: Secondary | ICD-10-CM

## 2013-08-10 DIAGNOSIS — D649 Anemia, unspecified: Secondary | ICD-10-CM | POA: Insufficient documentation

## 2013-08-10 DIAGNOSIS — R059 Cough, unspecified: Secondary | ICD-10-CM | POA: Insufficient documentation

## 2013-08-10 DIAGNOSIS — Z85828 Personal history of other malignant neoplasm of skin: Secondary | ICD-10-CM | POA: Insufficient documentation

## 2013-08-10 DIAGNOSIS — Z8719 Personal history of other diseases of the digestive system: Secondary | ICD-10-CM | POA: Insufficient documentation

## 2013-08-10 DIAGNOSIS — Z860100 Personal history of colon polyps, unspecified: Secondary | ICD-10-CM | POA: Insufficient documentation

## 2013-08-10 DIAGNOSIS — Z79899 Other long term (current) drug therapy: Secondary | ICD-10-CM | POA: Insufficient documentation

## 2013-08-10 DIAGNOSIS — Z7982 Long term (current) use of aspirin: Secondary | ICD-10-CM | POA: Insufficient documentation

## 2013-08-10 DIAGNOSIS — R071 Chest pain on breathing: Secondary | ICD-10-CM | POA: Insufficient documentation

## 2013-08-10 DIAGNOSIS — E039 Hypothyroidism, unspecified: Secondary | ICD-10-CM | POA: Insufficient documentation

## 2013-08-10 DIAGNOSIS — R05 Cough: Secondary | ICD-10-CM | POA: Insufficient documentation

## 2013-08-10 LAB — URINALYSIS, ROUTINE W REFLEX MICROSCOPIC
Bilirubin Urine: NEGATIVE
Glucose, UA: 100 mg/dL — AB
Hgb urine dipstick: NEGATIVE
KETONES UR: NEGATIVE mg/dL
Leukocytes, UA: NEGATIVE
Nitrite: NEGATIVE
PROTEIN: NEGATIVE mg/dL
Specific Gravity, Urine: 1.019 (ref 1.005–1.030)
Urobilinogen, UA: 0.2 mg/dL (ref 0.0–1.0)
pH: 5.5 (ref 5.0–8.0)

## 2013-08-10 LAB — COMPREHENSIVE METABOLIC PANEL
ALK PHOS: 60 U/L (ref 39–117)
ALT: 13 U/L (ref 0–35)
AST: 26 U/L (ref 0–37)
Albumin: 3.7 g/dL (ref 3.5–5.2)
BUN: 13 mg/dL (ref 6–23)
CO2: 25 meq/L (ref 19–32)
Calcium: 9.3 mg/dL (ref 8.4–10.5)
Chloride: 99 mEq/L (ref 96–112)
Creatinine, Ser: 0.69 mg/dL (ref 0.50–1.10)
GFR calc non Af Amer: 81 mL/min — ABNORMAL LOW (ref >90)
Glucose, Bld: 245 mg/dL — ABNORMAL HIGH (ref 70–99)
Potassium: 4.5 mEq/L (ref 3.7–5.3)
SODIUM: 137 meq/L (ref 137–147)
Total Bilirubin: 0.4 mg/dL (ref 0.3–1.2)
Total Protein: 7.1 g/dL (ref 6.0–8.3)

## 2013-08-10 LAB — I-STAT TROPONIN, ED: Troponin i, poc: 0 ng/mL (ref 0.00–0.08)

## 2013-08-10 LAB — CBC
HCT: 38.1 % (ref 36.0–46.0)
HEMOGLOBIN: 12.5 g/dL (ref 12.0–15.0)
MCH: 26.3 pg (ref 26.0–34.0)
MCHC: 32.8 g/dL (ref 30.0–36.0)
MCV: 80 fL (ref 78.0–100.0)
Platelets: 196 10*3/uL (ref 150–400)
RBC: 4.76 MIL/uL (ref 3.87–5.11)
RDW: 15.8 % — ABNORMAL HIGH (ref 11.5–15.5)
WBC: 6.3 10*3/uL (ref 4.0–10.5)

## 2013-08-10 MED ORDER — TRAMADOL HCL 50 MG PO TABS: 50.0000 mg | ORAL_TABLET | Freq: Four times a day (QID) | ORAL | Status: DC | PRN

## 2013-08-10 MED ORDER — TRAMADOL HCL 50 MG PO TABS
50.0000 mg | ORAL_TABLET | Freq: Once | ORAL | Status: AC
  Administered 2013-08-10: 50 mg via ORAL
  Filled 2013-08-10: qty 1

## 2013-08-10 NOTE — ED Provider Notes (Addendum)
CSN: [id number]     Arrival date & time 08/10/13  1224 History   First MD Initiated Contact with Patient 08/10/13 1418     Chief Complaint  Patient presents with  . Chest Pain     (Consider location/radiation/quality/duration/timing/severity/associated sxs/prior Treatment) Patient is a 80 y.o. female presenting with chest pain. The history is provided by the patient.  Chest Pain Associated symptoms: cough   Associated symptoms: no abdominal pain, no back pain, no fever, no headache, no palpitations, no shortness of breath and not vomiting   pt with hx cabg 2014, c/o pain left chest/shoulder area for the past 2 weeks. Pain constant, dull, moderate, non radiating. At rest. No relation to activity or exertion. No associated nv, diaphoresis or sob. No unusual doe or fatigue.  No change whether upright or supine. Not pleuritic. Pain is worse w change of position, turning torso, and palpation chest wall. Denies chest wall injury or strain. Occasional non prod cough. No fever or chills. Denies leg pain/swelling. No recent immobility, surgery, travel, or or trauma. Non smoker. No hx dvt or pe. +hx gerd, no heartburn.      Past Medical History  Diagnosis Date  . Tremor   . GERD (gastroesophageal reflux disease)   . Hyperlipidemia   . Allergy   . Urinary incontinence   . Diabetes mellitus type 2, controlled   . Hypertension     patient denies ever having hypertension  . Personal history of colonic polyps 11/20/2010    tubular adenomas  . Hypothyroidism   . Anemia   . Skin cancer     ear, right side of cheek   Past Surgical History  Procedure Laterality Date  . Appendectomy    . Cholecystectomy    . Abdominal hysterectomy    . Knee arthroscopy      bilateral  . Carpal tunnel release    . Back surgery    . Gallbladder surgery    . Knees Bilateral   . Coronary artery bypass graft N/A 11/26/2012    Procedure: CORONARY ARTERY BYPASS GRAFTING (CABG);  Surgeon: Grace Isaac, MD;   Location: [facility];  Service: Open Heart Surgery;  Laterality: N/A;  . Intraoperative transesophageal echocardiogram N/A 11/26/2012    Procedure: INTRAOPERATIVE TRANSESOPHAGEAL ECHOCARDIOGRAM;  Surgeon: Grace Isaac, MD;  Location: [facility];  Service: Open Heart Surgery;  Laterality: N/A;   Family History  Problem Relation Age of Onset  . Ovarian cancer Mother   . Diabetes Father   . Pneumonia Father   . Diabetes Sister    History  Substance Use Topics  . Smoking status: Never Smoker   . Smokeless tobacco: Never Used  . Alcohol Use: No   OB History   Grav Para Term Preterm Abortions TAB SAB Ect Mult Living                 Review of Systems  Constitutional: Negative for fever and chills.  HENT: Negative for sore throat.   Eyes: Negative for redness.  Respiratory: Positive for cough. Negative for shortness of breath.   Cardiovascular: Positive for chest pain. Negative for palpitations and leg swelling.  Gastrointestinal: Negative for vomiting, abdominal pain and diarrhea.  Genitourinary: Negative for flank pain.  Musculoskeletal: Negative for back pain and neck pain.  Skin: Negative for rash.  Neurological: Negative for headaches.  Hematological: Does not bruise/bleed easily.  Psychiatric/Behavioral: Negative for confusion.      Allergies  Celebrex; Crestor; Fenofibrate; Lipitor; and Simvastatin  Home Medications   Prior to Admission medications   Medication Sig Start Date End Date Taking? Authorizing Provider  aspirin EC 325 MG EC tablet Take 1 tablet (325 mg total) by mouth daily. 12/02/12   [name] Liston Alba, PA-C  atorvastatin (LIPITOR) 80 MG tablet Take 80 mg by mouth every evening.  02/05/13   Historical Provider, MD  ferrous sulfate 325 (65 FE) MG tablet Take 1 tablet (325 mg total) by mouth daily with breakfast. For one month then stop 12/02/12   Nani Skillern, PA-C  folic acid (FOLVITE) 1 MG tablet Take 1 tablet (1 mg total) by mouth daily. For one month  then stop. 12/02/12   [name] Liston Alba, PA-C  HYDROcodone-homatropine (HYCODAN) 5-1.5 MG/5ML syrup Take 5 mLs by mouth every 8 (eight) hours as needed for cough. 07/01/13   Mary-Margaret Hassell Done, FNP  levothyroxine (SYNTHROID, LEVOTHROID) 100 MCG tablet Take 100 mcg by mouth daily before breakfast.    Historical Provider, MD  lisinopril (PRINIVIL,ZESTRIL) 5 MG tablet Take 5 mg by mouth daily.    Historical Provider, MD  meclizine (ANTIVERT) 25 MG tablet TAKE 1 TABLET (25 MG TOTAL) BY MOUTH 3 (THREE) TIMES DAILY AS NEEDED FOR DIZZINESS. 07/21/13   Vernie Shanks, MD  mirabegron ER (MYRBETRIQ) 25 MG TB24 tablet Take 1 tablet (25 mg total) by mouth daily. 07/20/13   Tammy Eckard, PHARMD  propranolol (INDERAL) 60 MG tablet TAKE 1 TABLET BY MOUTH TWICE DAILY 03/10/13   Vernie Shanks, MD  senna (SENOKOT) 8.6 MG tablet Take 2 tablets by mouth at bedtime as needed for constipation.    Historical Provider, MD  sitaGLIPtin-metformin (JANUMET) 50-1000 MG per tablet Take 1 tablet by mouth 2 (two) times daily with a meal. 06/18/13   Tammy Eckard, PHARMD  Vitamin D, Ergocalciferol, (DRISDOL) 50000 UNITS CAPS capsule Take 1 capsule (50,000 Units total) by mouth every 7 (seven) days. 07/06/13   Tammy Eckard, PHARMD   BP 160/75  Pulse 67  Temp(Src) 97.8 F (36.6 C) (Oral)  Resp 14  SpO2 97% Physical Exam  Nursing note and vitals reviewed. Constitutional: She is oriented to person, place, and time. She appears well-developed and well-nourished. No distress.  HENT:  Mouth/Throat: Oropharynx is clear and moist.  Eyes: Conjunctivae are normal. No scleral icterus.  Neck: Neck supple. No tracheal deviation present.  Cardiovascular: Normal rate, regular rhythm, normal heart sounds and intact distal pulses.  Exam reveals no gallop and no friction rub.   No murmur heard. Pulmonary/Chest: Effort normal and breath sounds normal. No respiratory distress. She exhibits tenderness.  Marked chest wall tenderness reproducing  symptoms.  No chest/breast mass felt. No sts.   Abdominal: Soft. Normal appearance and bowel sounds are normal. She exhibits no distension and no mass. There is no tenderness. There is no rebound and no guarding.  Musculoskeletal: She exhibits no edema and no tenderness.  C/T/L spine non tender, aligned. Left mid to upper back muscular tenderness. pts pain/symptoms also reproducing by changing position on stretcher, turning.   Neurological: She is alert and oriented to person, place, and time.  Skin: Skin is warm and dry. No rash noted.  No rash/shingles to area of pain.   Psychiatric: She has a normal mood and affect.    ED Course  Procedures (including critical care time)  Results for orders placed during the hospital encounter of 08/10/13  CBC      Result Value Ref Range   WBC 6.3  4.0 - 10.5 K/uL  RBC 4.76  3.87 - 5.11 MIL/uL   Hemoglobin 12.5  12.0 - 15.0 g/dL   HCT 38.1  36.0 - 46.0 %   MCV 80.0  78.0 - 100.0 fL   MCH 26.3  26.0 - 34.0 pg   MCHC 32.8  30.0 - 36.0 g/dL   RDW 15.8 (*) 11.5 - 15.5 %   Platelets 196  150 - 400 K/uL  COMPREHENSIVE METABOLIC PANEL      Result Value Ref Range   Sodium 137  137 - 147 mEq/L   Potassium 4.5  3.7 - 5.3 mEq/L   Chloride 99  96 - 112 mEq/L   CO2 25  19 - 32 mEq/L   Glucose, Bld 245 (*) 70 - 99 mg/dL   BUN 13  6 - 23 mg/dL   Creatinine, Ser 0.69  0.50 - 1.10 mg/dL   Calcium 9.3  8.4 - 10.5 mg/dL   Total Protein 7.1  6.0 - 8.3 g/dL   Albumin 3.7  3.5 - 5.2 g/dL   AST 26  0 - 37 U/L   ALT 13  0 - 35 U/L   Alkaline Phosphatase 60  39 - 117 U/L   Total Bilirubin 0.4  0.3 - 1.2 mg/dL   GFR calc non Af Amer 81 (*) >90 mL/min   GFR calc Af Amer >90  >90 mL/min  URINALYSIS, ROUTINE W REFLEX MICROSCOPIC      Result Value Ref Range   Color, Urine YELLOW  YELLOW   APPearance CLEAR  CLEAR   Specific Gravity, Urine 1.019  1.005 - 1.030   pH 5.5  5.0 - 8.0   Glucose, UA 100 (*) NEGATIVE mg/dL   Hgb urine dipstick NEGATIVE  NEGATIVE    Bilirubin Urine NEGATIVE  NEGATIVE   Ketones, ur NEGATIVE  NEGATIVE mg/dL   Protein, ur NEGATIVE  NEGATIVE mg/dL   Urobilinogen, UA 0.2  0.0 - 1.0 mg/dL   Nitrite NEGATIVE  NEGATIVE   Leukocytes, UA NEGATIVE  NEGATIVE  I-STAT TROPOININ, ED      Result Value Ref Range   Troponin i, poc 0.00  0.00 - 0.08 ng/mL   Comment 3            Dg Chest 2 View  08/10/2013   CLINICAL DATA:  RIGHT arm pain, chest pain, history coronary artery disease post CABG, diabetes, hypertension, skin cancer  EXAM: CHEST  2 VIEW  COMPARISON:  01/05/2013  FINDINGS: Borderline enlargement of cardiac silhouette post CABG.  Slight pulmonary vascular congestion.  Mediastinal contours normal.  Calcified granuloma LEFT mid lung unchanged.  No acute infiltrate, pleural effusion or pneumothorax.  Scattered endplate spur formation thoracic spine.  IMPRESSION: Borderline enlargement of cardiac silhouette post CABG.  No acute abnormalities.   Electronically Signed   By: Lavonia Dana M.D.   On: 08/10/2013 13:14         EKG Interpretation   Date/Time:  Monday Aug 10 2013 12:28:45 EDT Ventricular Rate:  63 PR Interval:  148 QRS Duration: 84 QT Interval:  422 QTC Calculation: 431 R Axis:   -27 Text Interpretation:  Normal sinus rhythm Normal ECG `no acute st/t  changes Confirmed by Ashok Cordia  MD, Lennette Bihari ([id number]) on 08/10/2013 2:28:54 PM      MDM  Labs. Cxr. Ecg.  Reviewed nursing notes and prior charts for additional history.   Ultram po (pt has ride)  pts pain constant x 2 weeks, trop 0. ecg without acute st/tchanges, cxr without acute process.  On exam, symptoms appear musculoskeletal in etiology.   Notes reviewed in chart, pt had called cardiology office today, and was referred to f/u pcp, pcp office then had referred to ED.  Discussed labs, ecg, etc w pt, and also discussed given hx, plan for close outpt follow up with doctor/cardiologist.  Pt agreeable w plan.     Mirna Mires, MD 08/10/13 [phone]

## 2013-08-10 NOTE — Telephone Encounter (Signed)
Patient called complaining with chest pain and left arm pain. She called [facility] office and they told her to try and get in here today. Patient advised by [place] to get to the ER for evaluation. I called the ER and notified them that she is on her way by car

## 2013-08-10 NOTE — Telephone Encounter (Signed)
I spoke with Dr Percival Spanish and he would like the pt to contact her PCP for further evaluation.  Pt agreed with plan.

## 2013-08-10 NOTE — Discharge Instructions (Signed)
You may take ultram as need for pain - no driving if/when taking ultram. Follow up with your doctor/cardiologist in the coming week - call office tomorrow morning to arrange follow up with them. Also have your blood pressure rechecked then as it is high today.  Return to ER if worse, persistent/recurrent chest pain, exertional chest pain, trouble breathing, fevers, other concern.  You were given pain medication in the ER - no driving for the next 4 hours.     Chest Pain (Nonspecific) It is often hard to give a specific diagnosis for the cause of chest pain. There is always a chance that your pain could be related to something serious, such as a heart attack or a blood clot in the lungs. You need to follow up with your caregiver for further evaluation. CAUSES   Heartburn.  Pneumonia or bronchitis.  Anxiety or stress.  Inflammation around your heart (pericarditis) or lung (pleuritis or pleurisy).  A blood clot in the lung.  A collapsed lung (pneumothorax). It can develop suddenly on its own (spontaneous pneumothorax) or from injury (trauma) to the chest.  Shingles infection (herpes zoster virus). The chest wall is composed of bones, muscles, and cartilage. Any of these can be the source of the pain.  The bones can be bruised by injury.  The muscles or cartilage can be strained by coughing or overwork.  The cartilage can be affected by inflammation and become sore (costochondritis). DIAGNOSIS  Lab tests or other studies, such as X-rays, electrocardiography, stress testing, or cardiac imaging, may be needed to find the cause of your pain.  TREATMENT   Treatment depends on what may be causing your chest pain. Treatment may include:  Acid blockers for heartburn.  Anti-inflammatory medicine.  Pain medicine for inflammatory conditions.  Antibiotics if an infection is present.  You may be advised to change lifestyle habits. This includes stopping smoking and avoiding alcohol,  caffeine, and chocolate.  You may be advised to keep your head raised (elevated) when sleeping. This reduces the chance of acid going backward from your stomach into your esophagus.  Most of the time, nonspecific chest pain will improve within 2 to 3 days with rest and mild pain medicine. HOME CARE INSTRUCTIONS   If antibiotics were prescribed, take your antibiotics as directed. Finish them even if you start to feel better.  For the next few days, avoid physical activities that bring on chest pain. Continue physical activities as directed.  Do not smoke.  Avoid drinking alcohol.  Only take over-the-counter or prescription medicine for pain, discomfort, or fever as directed by your caregiver.  Follow your caregiver's suggestions for further testing if your chest pain does not go away.  Keep any follow-up appointments you made. If you do not go to an appointment, you could develop lasting (chronic) problems with pain. If there is any problem keeping an appointment, you must call to reschedule. SEEK MEDICAL CARE IF:   You think you are having problems from the medicine you are taking. Read your medicine instructions carefully.  Your chest pain does not go away, even after treatment.  You develop a rash with blisters on your chest. SEEK IMMEDIATE MEDICAL CARE IF:   You have increased chest pain or pain that spreads to your arm, neck, jaw, back, or abdomen.  You develop shortness of breath, an increasing cough, or you are coughing up blood.  You have severe back or abdominal pain, feel nauseous, or vomit.  You develop severe weakness, fainting,  or chills.  You have a fever. THIS IS AN EMERGENCY. Do not wait to see if the pain will go away. Get medical help at once. Call your local emergency services (911 in [place] Do not drive yourself to the hospital. MAKE SURE YOU:   Understand these instructions.  Will watch your condition.  Will get help right away if you are not doing  well or get worse. Document Released: 12/20/2004 Document Revised: 06/04/2011 Document Reviewed: 10/16/2007 [facility] Patient Information 2014 [place].    Chest Wall Pain Chest wall pain is pain in or around the bones and muscles of your chest. It may take up to 6 weeks to get better. It may take longer if you must stay physically active in your work and activities.  CAUSES  Chest wall pain may happen on its own. However, it may be caused by:  A viral illness like the flu.  Injury.  Coughing.  Exercise.  Arthritis.  Fibromyalgia.  Shingles. HOME CARE INSTRUCTIONS   Avoid overtiring physical activity. Try not to strain or perform activities that cause pain. This includes any activities using your chest or your abdominal and side muscles, especially if heavy weights are used.  Put ice on the sore area.  Put ice in a plastic bag.  Place a towel between your skin and the bag.  Leave the ice on for 15-20 minutes per hour while awake for the first 2 days.  Only take over-the-counter or prescription medicines for pain, discomfort, or fever as directed by your caregiver. SEEK IMMEDIATE MEDICAL CARE IF:   Your pain increases, or you are very uncomfortable.  You have a fever.  Your chest pain becomes worse.  You have new, unexplained symptoms.  You have nausea or vomiting.  You feel sweaty or lightheaded.  You have a cough with phlegm (sputum), or you cough up blood. MAKE SURE YOU:   Understand these instructions.  Will watch your condition.  Will get help right away if you are not doing well or get worse. Document Released: 03/12/2005 Document Revised: 06/04/2011 Document Reviewed: 11/06/2010 [facility] Patient Information 2014 [place], [place].   Hypertension As your heart beats, it forces blood through your arteries. This force is your blood pressure. If the pressure is too high, it is called hypertension (HTN) or high blood pressure. HTN is dangerous  because you may have it and not know it. High blood pressure may mean that your heart has to work harder to pump blood. Your arteries may be narrow or stiff. The extra work puts you at risk for heart disease, stroke, and other problems.  Blood pressure consists of two numbers, a higher number over a lower, 110/72, for example. It is stated as "110 over 72." The ideal is below 120 for the top number (systolic) and under 80 for the bottom (diastolic). Write down your blood pressure today. You should pay close attention to your blood pressure if you have certain conditions such as:  Heart failure.  Prior heart attack.  Diabetes  Chronic kidney disease.  Prior stroke.  Multiple risk factors for heart disease. To see if you have HTN, your blood pressure should be measured while you are seated with your arm held at the level of the heart. It should be measured at least twice. A one-time elevated blood pressure reading (especially in the Emergency Department) does not mean that you need treatment. There may be conditions in which the blood pressure is different between your right and left arms.  It is important to see your caregiver soon for a recheck. Most people have essential hypertension which means that there is not a specific cause. This type of high blood pressure may be lowered by changing lifestyle factors such as:  Stress.  Smoking.  Lack of exercise.  Excessive weight.  Drug/tobacco/alcohol use.  Eating less salt. Most people do not have symptoms from high blood pressure until it has caused damage to the body. Effective treatment can often prevent, delay or reduce that damage. TREATMENT  When a cause has been identified, treatment for high blood pressure is directed at the cause. There are a large number of medications to treat HTN. These fall into several categories, and your caregiver will help you select the medicines that are best for you. Medications may have side effects. You  should review side effects with your caregiver. If your blood pressure stays high after you have made lifestyle changes or started on medicines,   Your medication(s) may need to be changed.  Other problems may need to be addressed.  Be certain you understand your prescriptions, and know how and when to take your medicine.  Be sure to follow up with your caregiver within the time frame advised (usually within two weeks) to have your blood pressure rechecked and to review your medications.  If you are taking more than one medicine to lower your blood pressure, make sure you know how and at what times they should be taken. Taking two medicines at the same time can result in blood pressure that is too low. SEEK IMMEDIATE MEDICAL CARE IF:  You develop a severe headache, blurred or changing vision, or confusion.  You have unusual weakness or numbness, or a faint feeling.  You have severe chest or abdominal pain, vomiting, or breathing problems. MAKE SURE YOU:   Understand these instructions.  Will watch your condition.  Will get help right away if you are not doing well or get worse. Document Released: 03/12/2005 Document Revised: 06/04/2011 Document Reviewed: 10/31/2007 [facility] Patient Information 2014 [place].

## 2013-08-10 NOTE — Telephone Encounter (Signed)
I spoke with the pt and she complains of 6/10 CP right now.  The pt said she has had off and on CP for 2 WEEKS and then yesterday she had left arm pain.  The pt denies SOB and nausea but does c/o dizziness. The pt does not have NTG. She has been taking Ibuprofen but this is not helping her symptoms. The pt states she has had a constant pain since 8 AM this morning and at times the pain will worsen and then improve. The pt does have pain with expiration and pressing her left breast. The pain the pt is feeling is different than what she has been evaluated in the ER for in the past.  I will speak with Dr Percival Spanish about this pt.

## 2013-08-10 NOTE — ED Notes (Signed)
Cp x 2 weeks and then last night her left arm started to hurt so she came intoday no sob has hx of cabg

## 2013-08-10 NOTE — Telephone Encounter (Signed)
New Message:  Pt states she has been having chest pain on and off for about 2 weeks or more. Pt states she is having some chest pain right now. States she had pain in her left arm yesterday.

## 2013-08-11 ENCOUNTER — Ambulatory Visit: Payer: Federal, State, Local not specified - PPO | Admitting: Physical Therapy

## 2013-08-13 ENCOUNTER — Telehealth: Payer: Self-pay | Admitting: Family Medicine

## 2013-08-13 ENCOUNTER — Encounter: Payer: Federal, State, Local not specified - PPO | Admitting: Physical Therapy

## 2013-08-14 MED ORDER — MIRABEGRON ER 25 MG PO TB24: 25.0000 mg | ORAL_TABLET | Freq: Every day | ORAL | Status: DC

## 2013-08-14 NOTE — Telephone Encounter (Signed)
Patient aware and rx sent to pharmacy.  

## 2013-08-14 NOTE — Telephone Encounter (Signed)
myrbitriq 25mg  is for over active bladder.  I don't believe that we have samples of this any more. Can check - if we do OK to give up to 1 month of samples - otherwise will need rx from PCP.

## 2013-08-18 ENCOUNTER — Encounter: Payer: Federal, State, Local not specified - PPO | Admitting: Physical Therapy

## 2013-08-19 ENCOUNTER — Telehealth: Payer: Self-pay | Admitting: Pharmacist

## 2013-08-19 MED ORDER — TROSPIUM CHLORIDE ER 60 MG PO CP24
60.0000 mg | ORAL_CAPSULE | Freq: Every day | ORAL | Status: DC
Start: 2013-08-19 — End: 2013-08-20

## 2013-08-19 MED ORDER — FLUCONAZOLE 150 MG PO TABS: 150.0000 mg | ORAL_TABLET | Freq: Once | ORAL | Status: DC

## 2013-08-19 NOTE — Telephone Encounter (Signed)
Patient has taken others in the past Detrol LA, Vesicare without adequate effect on decreasing urinary symptoms.  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 Will try generic for [place] XR (trospium) 60mg  1 capsule daily.  Also sent in fluconazole 150mg  1 tablet for 1 dose for yeast infection.

## 2013-08-20 ENCOUNTER — Other Ambulatory Visit: Payer: Self-pay | Admitting: Nurse Practitioner

## 2013-08-20 ENCOUNTER — Encounter: Payer: Federal, State, Local not specified - PPO | Admitting: Physical Therapy

## 2013-08-20 MED ORDER — SOLIFENACIN SUCCINATE 10 MG PO TABS: 10.0000 mg | ORAL_TABLET | Freq: Every day | ORAL | Status: DC

## 2013-08-20 NOTE — Telephone Encounter (Signed)
Changed to vesicare and rx was sent to pharmacy

## 2013-08-21 ENCOUNTER — Other Ambulatory Visit: Payer: Self-pay

## 2013-08-21 MED ORDER — PROPRANOLOL HCL 60 MG PO TABS: ORAL_TABLET | ORAL | Status: DC

## 2013-08-26 ENCOUNTER — Other Ambulatory Visit: Payer: Self-pay | Admitting: *Deleted

## 2013-08-26 DIAGNOSIS — H8110 Benign paroxysmal vertigo, unspecified ear: Secondary | ICD-10-CM

## 2013-08-26 MED ORDER — MECLIZINE HCL 25 MG PO TABS: ORAL_TABLET | ORAL | Status: DC

## 2013-08-26 NOTE — Telephone Encounter (Signed)
Last refill 07/21/13. Last ov 4/15.

## 2013-09-02 ENCOUNTER — Ambulatory Visit: Payer: Federal, State, Local not specified - PPO | Admitting: Cardiology

## 2013-09-06 ENCOUNTER — Other Ambulatory Visit: Payer: Self-pay | Admitting: Nurse Practitioner

## 2013-09-18 ENCOUNTER — Encounter: Payer: Self-pay | Admitting: Cardiology

## 2013-09-18 ENCOUNTER — Ambulatory Visit (INDEPENDENT_AMBULATORY_CARE_PROVIDER_SITE_OTHER): Payer: Federal, State, Local not specified - PPO | Admitting: Cardiology

## 2013-09-18 VITALS — BP 131/82 | HR 85 | Ht 60.0 in | Wt 180.0 lb

## 2013-09-18 DIAGNOSIS — I10 Essential (primary) hypertension: Secondary | ICD-10-CM

## 2013-09-18 DIAGNOSIS — I2510 Atherosclerotic heart disease of native coronary artery without angina pectoris: Secondary | ICD-10-CM | POA: Insufficient documentation

## 2013-09-18 NOTE — Patient Instructions (Signed)
The current medical regimen is effective;  continue present plan and medications.  Follow up in 1 year with Dr Hochrein.  You will receive a letter in the mail 2 months before you are due.  Please call us when you receive this letter to schedule your follow up appointment.  

## 2013-09-18 NOTE — Progress Notes (Signed)
HPI The patient presents for evaluation after CABG.  Since she was last seen she's done much better although she did have ED visit for chest pain in May. This was felt to be musculoskeletal. She otherwise says she is doing well. She's dancing a couple times per week. The patient denies any new symptoms such as chest discomfort, neck or left arm discomfort. She has had some occasional right arm discomfort which is unlike her previous angina. This seems to hurt with movement.There has been no new shortness of breath, PND or orthopnea. There have been no reported palpitations, presyncope or syncope.     Allergies  Allergen Reactions  . Celebrex [Celecoxib] Other (See Comments)    Muscle aches and leg pains  . Crestor [Rosuvastatin Calcium] Other (See Comments)    Muscle aches and leg pains  . Fenofibrate Other (See Comments)    Muscle aches and leg pains  . Lipitor [Atorvastatin Calcium] Other (See Comments)    Muscle aches and leg pains  . Simvastatin Other (See Comments)    Muscle aches and leg pains    Current Outpatient Prescriptions  Medication Sig Dispense Refill  . aspirin 325 MG EC tablet Take 325 mg by mouth every other day.      Marland Kitchen atorvastatin (LIPITOR) 80 MG tablet Take 80 mg by mouth every evening.       . fluconazole (DIFLUCAN) 150 MG tablet Take 1 tablet (150 mg total) by mouth once.  1 tablet  0  . HYDROcodone-acetaminophen (NORCO) 10-325 MG per tablet Take 1 tablet by mouth every 6 (six) hours as needed for moderate pain.      Marland Kitchen levothyroxine (SYNTHROID, LEVOTHROID) 100 MCG tablet TAKE 1 TABLET EVERY DAY  90 tablet  1  . meclizine (ANTIVERT) 25 MG tablet TAKE 1 TABLET (25 MG TOTAL) BY MOUTH 3 (THREE) TIMES DAILY AS NEEDED FOR DIZZINESS.  30 tablet  0  . naproxen sodium (ANAPROX) 220 MG tablet Take 220 mg by mouth daily as needed. Pain      . propranolol (INDERAL) 60 MG tablet TAKE 1 TABLET BY MOUTH TWICE DAILY  180 tablet  0  . sitaGLIPtin-metformin (JANUMET) 50-1000 MG  per tablet Take 1 tablet by mouth 2 (two) times daily with a meal.  60 tablet  1  . solifenacin (VESICARE) 10 MG tablet Take 1 tablet (10 mg total) by mouth daily.  30 tablet  5  . traMADol (ULTRAM) 50 MG tablet Take 1 tablet (50 mg total) by mouth every 6 (six) hours as needed.  20 tablet  0  . Vitamin D, Ergocalciferol, (DRISDOL) 50000 UNITS CAPS capsule Take 50,000 Units by mouth every 7 (seven) days. Take on Mondays       No current facility-administered medications for this visit.    Past Medical History  Diagnosis Date  . Tremor   . GERD (gastroesophageal reflux disease)   . Hyperlipidemia   . Allergy   . Urinary incontinence   . Diabetes mellitus type 2, controlled   . Hypertension     patient denies ever having hypertension  . Personal history of colonic polyps 11/20/2010    tubular adenomas  . Hypothyroidism   . Anemia   . Skin cancer     ear, right side of cheek    Past Surgical History  Procedure Laterality Date  . Appendectomy    . Cholecystectomy    . Abdominal hysterectomy    . Knee arthroscopy  bilateral  . Carpal tunnel release    . Back surgery    . Gallbladder surgery    . Knees Bilateral   . Coronary artery bypass graft N/A 11/26/2012    Procedure: CORONARY ARTERY BYPASS GRAFTING (CABG);  Surgeon: Grace Isaac, MD;  Location: [facility];  Service: Open Heart Surgery;  Laterality: N/A;  . Intraoperative transesophageal echocardiogram N/A 11/26/2012    Procedure: INTRAOPERATIVE TRANSESOPHAGEAL ECHOCARDIOGRAM;  Surgeon: Grace Isaac, MD;  Location: [facility];  Service: Open Heart Surgery;  Laterality: N/A;    ROS:  As stated in the HPI and negative for all other systems.  PHYSICAL EXAM BP 131/82  Pulse 85  Ht 5' (1.524 m)  Wt 180 lb (81.647 kg)  BMI 35.15 kg/m2 GENERAL:  Well appearing NECK:  No jugular venous distention, waveform within normal limits, carotid upstroke brisk and symmetric, no bruits, no thyromegaly LUNGS:  Clear to auscultation  bilaterally BACK:  No CVA tenderness CHEST:  Well healed sternotomy scar. HEART:  PMI not displaced or sustained,S1 and S2 within normal limits, no S3, no S4, no clicks, no rubs, no murmurs ABD:  Flat, positive bowel sounds normal in frequency in pitch, no bruits, no rebound, no guarding, no midline pulsatile mass, no hepatomegaly, no splenomegaly EXT:  2 plus pulses throughout, no edema, no cyanosis no clubbing   ASSESSMENT AND PLAN  CAD/CABG:  The patient has no new sypmtoms.  No further cardiovascular testing is indicated.  We will continue with aggressive risk reduction and meds as listed.  HTN:  The blood pressure is at target. No change in medications is indicated. We will continue with therapeutic lifestyle changes (TLC).  DYSLIPIDEMIA:     LDL 58.  Triglycerides were high.  She is to follow with her PCP for treatment of this.    DM:  A1C was 8.1.  She is to follow with [facility].

## 2013-09-23 ENCOUNTER — Encounter: Payer: Self-pay | Admitting: Physician Assistant

## 2013-09-23 ENCOUNTER — Ambulatory Visit (INDEPENDENT_AMBULATORY_CARE_PROVIDER_SITE_OTHER): Payer: Federal, State, Local not specified - PPO | Admitting: Physician Assistant

## 2013-09-23 VITALS — BP 130/84 | HR 89 | Temp 97.9°F | Ht 60.0 in | Wt 186.4 lb

## 2013-09-23 DIAGNOSIS — J029 Acute pharyngitis, unspecified: Secondary | ICD-10-CM

## 2013-09-23 LAB — POCT RAPID STREP A (OFFICE): Rapid Strep A Screen: NEGATIVE

## 2013-09-23 MED ORDER — NYSTATIN 100000 UNIT/ML MT SUSP: 5.0000 mL | Freq: Four times a day (QID) | OROMUCOSAL | Status: DC

## 2013-09-23 NOTE — Progress Notes (Signed)
Subjective:     Patient ID: Leah Levine, female   DOB: 23-Dec-1932, 80 y.o.   MRN: [id number]  HPI Pt has noted a white coating to the tongue Also with sl S/T Currently on no inhalers or recent ATB rx  Review of Systems  Constitutional: Negative.   HENT: Positive for sore throat. Negative for mouth sores, nosebleeds, postnasal drip and rhinorrhea.   Respiratory: Negative.   Cardiovascular: Negative.    + pain and increase in sensitivity to the tongue No bleeding to the tongue No hx of same    Objective:   Physical Exam  Nursing note and vitals reviewed. Constitutional: She appears well-developed and well-nourished.  HENT:  Right Ear: External ear normal.  Left Ear: External ear normal.  Mouth/Throat: Oropharynx is clear and moist.  Sl white coating to the post aspect of tongue  Neck: Neck supple. No JVD present.  Cardiovascular: Normal rate, regular rhythm and normal heart sounds.   Lymphadenopathy:    She has no cervical adenopathy.       Assessment:     Thrush    Plan:     Nystatin Susp Fluids F/U prn

## 2013-09-23 NOTE — Patient Instructions (Signed)
Thrush, Adult  Thrush, also called oral candidiasis, is a fungal infection that develops in the mouth and throat and on the tongue. It causes white patches to form on the mouth and tongue. Thrush is most common in older adults, but it can occur at any age.  Many cases of thrush are mild, but this infection can also be more serious. Thrush can be a recurring problem for people who have chronic illnesses or who take medicines that limit the body's ability to fight infection. Because these people have difficulty fighting infections, the fungus that causes thrush can spread throughout the body. This can cause life-threatening blood or organ infections. CAUSES  Thrush is usually caused by a yeast called Candida albicans. This fungus is normally present in small amounts in the mouth and on other mucous membranes. It usually causes no harm. However, when conditions are present that allow the fungus to grow uncontrolled, it invades surrounding tissues and becomes an infection. Less often, other Candida species can also lead to thrush.  RISK FACTORS Thrush is more likely to develop in the following people:  People with an impaired ability to fight infection (weakened immune system).   Older adults.   People with HIV.   People with diabetes.   People with dry mouth (xerostomia).   Pregnant women.   People with poor dental care, especially those who have false teeth.   People who use antibiotic medicines.  SIGNS AND SYMPTOMS  Thrush can be a mild infection that causes no symptoms. If symptoms develop, they may include:   A burning feeling in the mouth and throat. This can occur at the start of a thrush infection.   White patches that adhere to the mouth and tongue. The tissue around the patches may be red, raw, and painful. If rubbed (during tooth brushing, for example), the patches and the tissue of the mouth may bleed easily.   A bad taste in the mouth or difficulty tasting foods.    Cottony feeling in the mouth.   Pain during eating and swallowing. DIAGNOSIS  Your health care provider can usually diagnose thrush by looking in your mouth and asking you questions about your health.  TREATMENT  Medicines that help prevent the growth of fungi (antifungals) are the standard treatment for thrush. These medicines are either applied directly to the affected area (topical) or swallowed (oral). The treatment will depend on the severity of the condition.  Mild Thrush Mild cases of thrush may clear up with the use of an antifungal mouth rinse or lozenges. Treatment usually lasts about 14 days.  Moderate to Severe Thrush  More severe thrush infections that have spread to the esophagus are treated with an oral antifungal medicine. A topical antifungal medicine may also be used.   For some severe infections, a treatment period longer than 14 days may be needed.   Oral antifungal medicines are almost never used during pregnancy because the fetus may be harmed. However, if a pregnant woman has a rare, severe thrush infection that has spread to her blood, oral antifungal medicines may be used. In this case, the risk of harm to the mother and fetus from the severe thrush infection may be greater than the risk posed by the use of antifungal medicines.  Persistent or Recurrent Thrush For cases of thrush that do not go away or keep coming back, treatment may involve the following:   Treatment may be needed twice as long as the symptoms last.   Treatment will   include both oral and topical antifungal medicines.   People with weakened immune systems can take an antifungal medicine on a continuous basis to prevent thrush infections.  It is important to treat conditions that make you more likely to get thrush, such as diabetes or HIV.  HOME CARE INSTRUCTIONS   Only take over-the-counter or prescription medicine as directed by your health care provider. Talk to your health care  provider about an over-the-counter medicine called gentian violet, which kills bacteria and fungi.   Eat plain, unflavored yogurt as directed by your health care provider. Check the label to make sure the yogurt contains live cultures. This yogurt can help healthy bacteria grow in the mouth that can stop the growth of the fungus that causes thrush.   Try these measures to help reduce the discomfort of thrush:   Drink cold liquids such as water or iced tea.   Try flavored ice treats or frozen juices.   Eat foods that are easy to swallow, such as gelatin, ice cream, or custard.   If the patches in your mouth are painful, try drinking from a straw.   Rinse your mouth several times a day with a warm saltwater rinse. You can make the saltwater mixture with 1 tsp (6 g) of salt in 8 fl oz (0.2 L) of warm water.   If you wear dentures, remove the dentures before going to bed, brush them vigorously, and soak them in a cleaning solution as directed by your health care provider.   Women who are breastfeeding should clean their nipples with an antifungal medicine as directed by their health care provider. Dry the nipples after breastfeeding. Applying lanolin-containing body lotion may help relieve nipple soreness.  SEEK MEDICAL CARE IF:  Your symptoms are getting worse or are not improving within 7 days of starting treatment.   You have symptoms of spreading infection, such as white patches on the skin outside of the mouth.   You are nursing and you have redness, burning, or pain in the nipples that is not relieved with treatment.  MAKE SURE YOU:  Understand these instructions.  Will watch your condition.  Will get help right away if you are not doing well or get worse. Document Released: 12/06/2003 Document Revised: 12/31/2012 Document Reviewed: 10/13/2012 [employer] Patient Information 2015 [employer]. This information is not intended to replace advice given to you by your  health care provider. Make sure you discuss any questions you have with your health care provider.  

## 2013-09-24 ENCOUNTER — Other Ambulatory Visit: Payer: Self-pay | Admitting: Nurse Practitioner

## 2013-10-08 ENCOUNTER — Telehealth: Payer: Self-pay | Admitting: Family Medicine

## 2013-10-08 NOTE — Telephone Encounter (Signed)
appt schedule

## 2013-10-09 ENCOUNTER — Ambulatory Visit (INDEPENDENT_AMBULATORY_CARE_PROVIDER_SITE_OTHER): Payer: Federal, State, Local not specified - PPO | Admitting: Nurse Practitioner

## 2013-10-09 ENCOUNTER — Encounter: Payer: Self-pay | Admitting: Nurse Practitioner

## 2013-10-09 VITALS — BP 138/78 | HR 82 | Temp 98.1°F | Ht 60.0 in | Wt 183.0 lb

## 2013-10-09 DIAGNOSIS — B370 Candidal stomatitis: Secondary | ICD-10-CM

## 2013-10-09 DIAGNOSIS — K1370 Unspecified lesions of oral mucosa: Secondary | ICD-10-CM

## 2013-10-09 DIAGNOSIS — K1379 Other lesions of oral mucosa: Secondary | ICD-10-CM

## 2013-10-09 MED ORDER — NYSTATIN 100000 UNIT/ML MT SUSP: 5.0000 mL | Freq: Four times a day (QID) | OROMUCOSAL | Status: DC

## 2013-10-09 NOTE — Patient Instructions (Signed)
Thrush, Adult  Thrush, also called oral candidiasis, is a fungal infection that develops in the mouth and throat and on the tongue. It causes white patches to form on the mouth and tongue. Thrush is most common in older adults, but it can occur at any age.  Many cases of thrush are mild, but this infection can also be more serious. Thrush can be a recurring problem for people who have chronic illnesses or who take medicines that limit the body's ability to fight infection. Because these people have difficulty fighting infections, the fungus that causes thrush can spread throughout the body. This can cause life-threatening blood or organ infections. CAUSES  Thrush is usually caused by a yeast called Candida albicans. This fungus is normally present in small amounts in the mouth and on other mucous membranes. It usually causes no harm. However, when conditions are present that allow the fungus to grow uncontrolled, it invades surrounding tissues and becomes an infection. Less often, other Candida species can also lead to thrush.  RISK FACTORS Thrush is more likely to develop in the following people:  People with an impaired ability to fight infection (weakened immune system).   Older adults.   People with HIV.   People with diabetes.   People with dry mouth (xerostomia).   Pregnant women.   People with poor dental care, especially those who have false teeth.   People who use antibiotic medicines.  SIGNS AND SYMPTOMS  Thrush can be a mild infection that causes no symptoms. If symptoms develop, they may include:   A burning feeling in the mouth and throat. This can occur at the start of a thrush infection.   White patches that adhere to the mouth and tongue. The tissue around the patches may be red, raw, and painful. If rubbed (during tooth brushing, for example), the patches and the tissue of the mouth may bleed easily.   A bad taste in the mouth or difficulty tasting foods.    Cottony feeling in the mouth.   Pain during eating and swallowing. DIAGNOSIS  Your health care provider can usually diagnose thrush by looking in your mouth and asking you questions about your health.  TREATMENT  Medicines that help prevent the growth of fungi (antifungals) are the standard treatment for thrush. These medicines are either applied directly to the affected area (topical) or swallowed (oral). The treatment will depend on the severity of the condition.  Mild Thrush Mild cases of thrush may clear up with the use of an antifungal mouth rinse or lozenges. Treatment usually lasts about 14 days.  Moderate to Severe Thrush  More severe thrush infections that have spread to the esophagus are treated with an oral antifungal medicine. A topical antifungal medicine may also be used.   For some severe infections, a treatment period longer than 14 days may be needed.   Oral antifungal medicines are almost never used during pregnancy because the fetus may be harmed. However, if a pregnant woman has a rare, severe thrush infection that has spread to her blood, oral antifungal medicines may be used. In this case, the risk of harm to the mother and fetus from the severe thrush infection may be greater than the risk posed by the use of antifungal medicines.  Persistent or Recurrent Thrush For cases of thrush that do not go away or keep coming back, treatment may involve the following:   Treatment may be needed twice as long as the symptoms last.   Treatment will   include both oral and topical antifungal medicines.   People with weakened immune systems can take an antifungal medicine on a continuous basis to prevent thrush infections.  It is important to treat conditions that make you more likely to get thrush, such as diabetes or HIV.  HOME CARE INSTRUCTIONS   Only take over-the-counter or prescription medicine as directed by your health care provider. Talk to your health care  provider about an over-the-counter medicine called gentian violet, which kills bacteria and fungi.   Eat plain, unflavored yogurt as directed by your health care provider. Check the label to make sure the yogurt contains live cultures. This yogurt can help healthy bacteria grow in the mouth that can stop the growth of the fungus that causes thrush.   Try these measures to help reduce the discomfort of thrush:   Drink cold liquids such as water or iced tea.   Try flavored ice treats or frozen juices.   Eat foods that are easy to swallow, such as gelatin, ice cream, or custard.   If the patches in your mouth are painful, try drinking from a straw.   Rinse your mouth several times a day with a warm saltwater rinse. You can make the saltwater mixture with 1 tsp (6 g) of salt in 8 fl oz (0.2 L) of warm water.   If you wear dentures, remove the dentures before going to bed, brush them vigorously, and soak them in a cleaning solution as directed by your health care provider.   Women who are breastfeeding should clean their nipples with an antifungal medicine as directed by their health care provider. Dry the nipples after breastfeeding. Applying lanolin-containing body lotion may help relieve nipple soreness.  SEEK MEDICAL CARE IF:  Your symptoms are getting worse or are not improving within 7 days of starting treatment.   You have symptoms of spreading infection, such as white patches on the skin outside of the mouth.   You are nursing and you have redness, burning, or pain in the nipples that is not relieved with treatment.  MAKE SURE YOU:  Understand these instructions.  Will watch your condition.  Will get help right away if you are not doing well or get worse. Document Released: 12/06/2003 Document Revised: 12/31/2012 Document Reviewed: 10/13/2012 [employer] Patient Information 2015 [employer]. This information is not intended to replace advice given to you by your  health care provider. Make sure you discuss any questions you have with your health care provider.  

## 2013-10-09 NOTE — Progress Notes (Signed)
   Subjective:    Patient ID: Leah Levine, female    DOB: Sep 23, 1932, 80 y.o.   MRN: [id number]  HPI Patient is here today for mouth sores. She reports the problem started over 2 weeks ago. She has sore throat. She denies any fever or runny nose. She has gargled salt water which provide minimal relief.   *She complains of indigestion which she takes tumb or peptobismol which helps.    Review of Systems  Constitutional: Negative.   HENT: Negative.   Eyes: Negative.   Respiratory: Negative.   Cardiovascular: Negative.   Gastrointestinal: Negative.   Endocrine: Negative.   Genitourinary: Negative.   Allergic/Immunologic: Negative.   Neurological: Negative.   Hematological: Negative.   Psychiatric/Behavioral: Negative.        Objective:   Physical Exam  Constitutional: She is oriented to person, place, and time. She appears well-developed.  HENT:  Head: Normocephalic.  White plaques on tongue.   Eyes: Pupils are equal, round, and reactive to light.  Neck: Normal range of motion.  Pulmonary/Chest: Effort normal and breath sounds normal.  Musculoskeletal: Normal range of motion.  Neurological: She is alert and oriented to person, place, and time.  Skin: Skin is warm.     BP 138/78  Pulse 82  Temp(Src) 98.1 F (36.7 C) (Oral)  Ht 5' (1.524 m)  Wt 183 lb (83.008 kg)  BMI 35.74 kg/m2      Assessment & Plan:   1. Mouth sores   2. Thrush, oral    Meds ordered this encounter  Medications  . nystatin (MYCOSTATIN) 100000 UNIT/ML suspension    Sig: Take 5 mLs (500,000 Units total) by mouth 4 (four) times daily. Swish and swallow    Dispense:  60 mL    Refill:  0    Order Specific Question:  Supervising Provider    Answer:  Chipper Herb [[id number]]   Avoid acidic drinks until resolved RTO prn  Mary-Margaret Hassell Done, FNP

## 2013-10-28 ENCOUNTER — Encounter: Payer: Self-pay | Admitting: Family

## 2013-10-28 ENCOUNTER — Ambulatory Visit (INDEPENDENT_AMBULATORY_CARE_PROVIDER_SITE_OTHER): Payer: Federal, State, Local not specified - PPO | Admitting: Family

## 2013-10-28 VITALS — BP 149/87 | HR 84 | Temp 97.0°F | Ht 60.0 in | Wt 183.8 lb

## 2013-10-28 DIAGNOSIS — B372 Candidiasis of skin and nail: Secondary | ICD-10-CM

## 2013-10-28 MED ORDER — FLUCONAZOLE 150 MG PO TABS: 150.0000 mg | ORAL_TABLET | Freq: Once | ORAL | Status: DC

## 2013-10-28 MED ORDER — FLUCONAZOLE 150 MG PO TABS: 150.0000 mg | ORAL_TABLET | Freq: Every day | ORAL | Status: DC

## 2013-10-28 MED ORDER — NYSTATIN-TRIAMCINOLONE 100000-0.1 UNIT/GM-% EX OINT: 1.0000 "application " | TOPICAL_OINTMENT | Freq: Two times a day (BID) | CUTANEOUS | Status: DC

## 2013-10-28 MED ORDER — NYSTATIN 100000 UNIT/GM EX POWD: 100000.0000 | Freq: Two times a day (BID) | CUTANEOUS | Status: DC

## 2013-10-28 NOTE — Progress Notes (Signed)
   Subjective:    Patient ID: Leah Levine, female    DOB: [birth year], 80 y.o.   MRN: [id number]  Rash This is a recurrent problem. The current episode started 1 to 4 weeks ago (Three weeks). The problem has been gradually worsening since onset. The affected locations include the abdomen and groin. The rash is characterized by burning, itchiness and redness. Pertinent negatives include no congestion, diarrhea, fever or shortness of breath. Past treatments include antihistamine, anti-itch cream and antibiotic cream. The treatment provided moderate relief.      Review of Systems  Constitutional: Negative.  Negative for fever.  HENT: Negative.  Negative for congestion.   Eyes: Negative.   Respiratory: Negative.  Negative for shortness of breath.   Cardiovascular: Negative.  Negative for palpitations.  Gastrointestinal: Negative.  Negative for diarrhea.  Endocrine: Negative.   Genitourinary: Negative.   Musculoskeletal: Negative.   Skin: Positive for rash.  Neurological: Negative.  Negative for headaches.  Hematological: Negative.   Psychiatric/Behavioral: Negative.   All other systems reviewed and are negative.      Objective:   Physical Exam  Vitals reviewed. Constitutional: She is oriented to person, place, and time. She appears well-developed and well-nourished. No distress.  Cardiovascular: Normal rate, regular rhythm, normal heart sounds and intact distal pulses.   No murmur heard. Pulmonary/Chest: Effort normal and breath sounds normal. No respiratory distress. She has no wheezes.  Abdominal: Soft. Bowel sounds are normal. She exhibits no distension. There is no tenderness.  Musculoskeletal: Normal range of motion. She exhibits no edema and no tenderness.  Neurological: She is alert and oriented to person, place, and time. She has normal reflexes. No cranial nerve deficit.  Skin: Skin is warm and dry. Rash noted. There is erythema.  Under abd and in folds of groin  angry erythemas generalized rash  Psychiatric: She has a normal mood and affect. Her behavior is normal. Judgment and thought content normal.     BP 149/87  Pulse 84  Temp(Src) 97 F (36.1 C) (Oral)  Ht 5' (1.524 m)  Wt 183 lb 12.8 oz (83.371 kg)  BMI 35.90 kg/m2      Assessment & Plan:  1. Yeast infection of the skin -Keep area clean and dry -Do not scratch -Wear loose fitting clothing until heals - fluconazole (DIFLUCAN) 150 MG tablet; Take 1 tablet (150 mg total) by mouth once.  Dispense: 5 tablet; Refill: 0 - nystatin-triamcinolone ointment (MYCOLOG); Apply 1 application topically 2 (two) times daily.  Dispense: 30 g; Refill: 3 - nystatin (MYCOSTATIN/NYSTOP) 100000 UNIT/GM POWD; Apply 100,000 Bottles topically 2 (two) times daily.  Dispense: 60 g; Refill: 2 -RTO prn  Evelina Dun, FNP

## 2013-10-28 NOTE — Patient Instructions (Signed)
Yeast Infection of the Skin Some yeast on the skin is normal, but sometimes it causes an infection. If you have a yeast infection, it shows up as white or light brown patches on brown skin. You can see it better in the summer on tan skin. It causes light-colored holes in your suntan. It can happen on any area of the body. This cannot be passed from person to person. HOME CARE  Scrub your skin daily with a dandruff shampoo. Your rash may take a couple weeks to get well.  Do not scratch or itch the rash. GET HELP RIGHT AWAY IF:   You get another infection from scratching. The skin may get warm, red, and may ooze fluid.  The infection does not seem to be getting better. MAKE SURE YOU:  Understand these instructions.  Will watch your condition.  Will get help right away if you are not doing well or get worse. Document Released: 02/23/2008 Document Revised: 06/04/2011 Document Reviewed: 02/23/2008 [employer] Patient Information 2015 [employer]. This information is not intended to replace advice given to you by your health care provider. Make sure you discuss any questions you have with your health care provider.  

## 2013-10-29 ENCOUNTER — Other Ambulatory Visit: Payer: Self-pay | Admitting: Family Medicine

## 2013-10-30 ENCOUNTER — Telehealth: Payer: Self-pay | Admitting: *Deleted

## 2013-10-30 MED ORDER — MAGIC MOUTHWASH: 5.0000 mL | Freq: Three times a day (TID) | ORAL | Status: DC

## 2013-10-30 NOTE — Telephone Encounter (Signed)
Lt pat know magic mouth wash sent to pharmacy

## 2013-10-30 NOTE — Telephone Encounter (Signed)
Her mouth is still not well. Will you call me something in to [facility]

## 2013-10-31 ENCOUNTER — Telehealth: Payer: Self-pay | Admitting: Nurse Practitioner

## 2013-10-31 NOTE — Telephone Encounter (Signed)
Patient aware.

## 2013-10-31 NOTE — Telephone Encounter (Signed)
Spoke with pateint and rash is some better- wanted to know if urastat would help rash but that is nit for a rash- patient understood

## 2013-11-03 ENCOUNTER — Other Ambulatory Visit: Payer: Self-pay | Admitting: Family Medicine

## 2013-11-22 ENCOUNTER — Other Ambulatory Visit: Payer: Self-pay | Admitting: Family Medicine

## 2013-12-01 ENCOUNTER — Encounter: Payer: Self-pay | Admitting: Family

## 2013-12-01 ENCOUNTER — Ambulatory Visit (INDEPENDENT_AMBULATORY_CARE_PROVIDER_SITE_OTHER): Payer: Federal, State, Local not specified - PPO | Admitting: Family

## 2013-12-01 VITALS — BP 134/75 | HR 84 | Temp 99.0°F | Ht 60.0 in | Wt 184.0 lb

## 2013-12-01 DIAGNOSIS — J069 Acute upper respiratory infection, unspecified: Secondary | ICD-10-CM

## 2013-12-01 MED ORDER — AZITHROMYCIN 250 MG PO TABS: ORAL_TABLET | ORAL | Status: DC

## 2013-12-01 MED ORDER — BENZONATATE 200 MG PO CAPS: 200.0000 mg | ORAL_CAPSULE | Freq: Three times a day (TID) | ORAL | Status: DC | PRN

## 2013-12-01 NOTE — Patient Instructions (Signed)
Upper Respiratory Infection, Adult An upper respiratory infection (URI) is also known as the common cold. It is often caused by a type of germ (virus). Colds are easily spread (contagious). You can pass it to others by kissing, coughing, sneezing, or drinking out of the same glass. Usually, you get better in 1 or 2 weeks.  HOME CARE   Only take medicine as told by your doctor.  Use a warm mist humidifier or breathe in steam from a hot shower.  Drink enough water and fluids to keep your pee (urine) clear or pale yellow.  Get plenty of rest.  Return to work when your temperature is back to normal or as told by your doctor. You may use a face mask and wash your hands to stop your cold from spreading. GET HELP RIGHT AWAY IF:   After the first few days, you feel you are getting worse.  You have questions about your medicine.  You have chills, shortness of breath, or brown or red spit (mucus).  You have yellow or brown snot (nasal discharge) or pain in the face, especially when you bend forward.  You have a fever, puffy (swollen) neck, pain when you swallow, or white spots in the back of your throat.  You have a bad headache, ear pain, sinus pain, or chest pain.  You have a high-pitched whistling sound when you breathe in and out (wheezing).  You have a lasting cough or cough up blood.  You have sore muscles or a stiff neck. MAKE SURE YOU:   Understand these instructions.  Will watch your condition.  Will get help right away if you are not doing well or get worse. Document Released: 08/29/2007 Document Revised: 06/04/2011 Document Reviewed: 06/17/2013 [facility] Patient Information 2015 [place], [place]. This information is not intended to replace advice given to you by your health care provider. Make sure you discuss any questions you have with your health care provider.  - Take meds as prescribed - Use a cool mist humidifier  -Use saline nose sprays frequently -Saline  irrigations of the nose can be very helpful if done frequently.  * 4X daily for 1 week*  * Use of a nettie pot can be helpful with this. Follow directions with this* -Force fluids -For any cough or congestion  Use plain Mucinex- regular strength or max strength is fine   * Children- consult with Pharmacist for dosing -For fever or aces or pains- take tylenol or ibuprofen appropriate for age and weight.  * for fevers greater than 101 orally you may alternate ibuprofen and tylenol every  3 hours. -Throat lozenges if help   Evelina Dun, FNP

## 2013-12-01 NOTE — Progress Notes (Signed)
   Subjective:    Patient ID: Leah Levine, female    DOB: May 27, 1932, 80 y.o.   MRN: [id number]  Cough This is a new problem. The current episode started in the past 7 days (Friday). The problem has been gradually worsening. The problem occurs constantly. The cough is non-productive. Associated symptoms include ear pain, a fever, postnasal drip, rhinorrhea and a sore throat. Pertinent negatives include no chills, ear congestion, headaches, hemoptysis, nasal congestion, shortness of breath or wheezing. The symptoms are aggravated by lying down. She has tried rest and OTC cough suppressant for the symptoms. The treatment provided mild relief. There is no history of asthma.      Review of Systems  Constitutional: Positive for fever. Negative for chills.  HENT: Positive for ear pain, postnasal drip, rhinorrhea and sore throat.   Eyes: Negative.   Respiratory: Positive for cough. Negative for hemoptysis, shortness of breath and wheezing.   Cardiovascular: Negative.  Negative for palpitations.  Gastrointestinal: Negative.   Endocrine: Negative.   Genitourinary: Negative.   Musculoskeletal: Negative.   Neurological: Negative.  Negative for headaches.  Hematological: Negative.   Psychiatric/Behavioral: Negative.   All other systems reviewed and are negative.      Objective:   Physical Exam  Vitals reviewed. Constitutional: She is oriented to person, place, and time. She appears well-developed and well-nourished. No distress.  HENT:  Head: Normocephalic and atraumatic.  Right Ear: External ear normal.  Mouth/Throat: Oropharynx is clear and moist.  Eyes: Pupils are equal, round, and reactive to light.  Neck: Normal range of motion. Neck supple. No thyromegaly present.  Cardiovascular: Normal rate, regular rhythm, normal heart sounds and intact distal pulses.   No murmur heard. Pulmonary/Chest: Effort normal and breath sounds normal. No respiratory distress. She has no wheezes.   Abdominal: Soft. Bowel sounds are normal. She exhibits no distension. There is no tenderness.  Musculoskeletal: Normal range of motion. She exhibits no edema and no tenderness.  Neurological: She is alert and oriented to person, place, and time. She has normal reflexes. No cranial nerve deficit.  Skin: Skin is warm and dry.  Psychiatric: She has a normal mood and affect. Her behavior is normal. Judgment and thought content normal.     BP 134/75  Pulse 84  Temp(Src) 99 F (37.2 C) (Oral)  Ht 5' (1.524 m)  Wt 184 lb (83.462 kg)  BMI 35.94 kg/m2      Assessment & Plan:  1. URI (upper respiratory infection) -- Take meds as prescribed - Use a cool mist humidifier  -Use saline nose sprays frequently -Saline irrigations of the nose can be very helpful if done frequently.  * 4X daily for 1 week*  * Use of a nettie pot can be helpful with this. Follow directions with this* -Force fluids -For any cough or congestion  Use plain Mucinex- regular strength or max strength is fine   * Children- consult with Pharmacist for dosing -For fever or aces or pains- take tylenol or ibuprofen appropriate for age and weight.  * for fevers greater than 101 orally you may alternate ibuprofen and tylenol every  3 hours. -Throat lozenges if help - azithromycin (ZITHROMAX) 250 MG tablet; Take 500 mg day 1 then 250 mg for 4 days  Dispense: 6 tablet; Refill: 0 - benzonatate (TESSALON) 200 MG capsule; Take 1 capsule (200 mg total) by mouth 3 (three) times daily as needed for cough.  Dispense: 30 capsule; Refill: [place], FNP

## 2013-12-09 ENCOUNTER — Observation Stay (HOSPITAL_COMMUNITY)
Admission: EM | Admit: 2013-12-09 | Discharge: 2013-12-11 | Disposition: A | Discharge status: Home / Self Care | Payer: Federal, State, Local not specified - PPO | Attending: Family Medicine | Admitting: Family Medicine

## 2013-12-09 ENCOUNTER — Encounter (HOSPITAL_COMMUNITY): Payer: Self-pay | Admitting: Emergency Medicine

## 2013-12-09 ENCOUNTER — Emergency Department (HOSPITAL_COMMUNITY): Payer: Federal, State, Local not specified - PPO

## 2013-12-09 DIAGNOSIS — E785 Hyperlipidemia, unspecified: Secondary | ICD-10-CM | POA: Insufficient documentation

## 2013-12-09 DIAGNOSIS — B379 Candidiasis, unspecified: Secondary | ICD-10-CM | POA: Diagnosis not present

## 2013-12-09 DIAGNOSIS — E039 Hypothyroidism, unspecified: Secondary | ICD-10-CM | POA: Diagnosis not present

## 2013-12-09 DIAGNOSIS — E119 Type 2 diabetes mellitus without complications: Secondary | ICD-10-CM | POA: Diagnosis not present

## 2013-12-09 DIAGNOSIS — K219 Gastro-esophageal reflux disease without esophagitis: Secondary | ICD-10-CM | POA: Insufficient documentation

## 2013-12-09 DIAGNOSIS — R55 Syncope and collapse: Principal | ICD-10-CM | POA: Insufficient documentation

## 2013-12-09 DIAGNOSIS — R0789 Other chest pain: Secondary | ICD-10-CM

## 2013-12-09 DIAGNOSIS — Z951 Presence of aortocoronary bypass graft: Secondary | ICD-10-CM | POA: Diagnosis not present

## 2013-12-09 DIAGNOSIS — R1310 Dysphagia, unspecified: Secondary | ICD-10-CM

## 2013-12-09 DIAGNOSIS — I2510 Atherosclerotic heart disease of native coronary artery without angina pectoris: Secondary | ICD-10-CM

## 2013-12-09 DIAGNOSIS — Z791 Long term (current) use of non-steroidal anti-inflammatories (NSAID): Secondary | ICD-10-CM | POA: Diagnosis not present

## 2013-12-09 DIAGNOSIS — T671XXD Heat syncope, subsequent encounter: Secondary | ICD-10-CM

## 2013-12-09 DIAGNOSIS — I10 Essential (primary) hypertension: Secondary | ICD-10-CM | POA: Diagnosis not present

## 2013-12-09 HISTORY — DX: Atherosclerotic heart disease of native coronary artery without angina pectoris: I25.10

## 2013-12-09 LAB — COMPREHENSIVE METABOLIC PANEL
ALT: 14 U/L (ref 0–35)
ANION GAP: 12 (ref 5–15)
AST: 27 U/L (ref 0–37)
Albumin: 3.5 g/dL (ref 3.5–5.2)
Alkaline Phosphatase: 61 U/L (ref 39–117)
BUN: 17 mg/dL (ref 6–23)
CALCIUM: 9.6 mg/dL (ref 8.4–10.5)
CO2: 27 mEq/L (ref 19–32)
CREATININE: 0.87 mg/dL (ref 0.50–1.10)
Chloride: 95 mEq/L — ABNORMAL LOW (ref 96–112)
GFR calc non Af Amer: 62 mL/min — ABNORMAL LOW (ref >90)
GFR, EST AFRICAN AMERICAN: 72 mL/min — AB (ref >90)
GLUCOSE: 345 mg/dL — AB (ref 70–99)
Potassium: 4.7 mEq/L (ref 3.7–5.3)
Sodium: 134 mEq/L — ABNORMAL LOW (ref 137–147)
TOTAL PROTEIN: 7 g/dL (ref 6.0–8.3)
Total Bilirubin: 0.3 mg/dL (ref 0.3–1.2)

## 2013-12-09 LAB — I-STAT TROPONIN, ED: TROPONIN I, POC: 0 ng/mL (ref 0.00–0.08)

## 2013-12-09 LAB — CBC
HCT: 41.3 % (ref 36.0–46.0)
HEMOGLOBIN: 13.5 g/dL (ref 12.0–15.0)
MCH: 26.4 pg (ref 26.0–34.0)
MCHC: 32.7 g/dL (ref 30.0–36.0)
MCV: 80.7 fL (ref 78.0–100.0)
Platelets: 232 10*3/uL (ref 150–400)
RBC: 5.12 MIL/uL — ABNORMAL HIGH (ref 3.87–5.11)
RDW: 16.3 % — ABNORMAL HIGH (ref 11.5–15.5)
WBC: 8.4 10*3/uL (ref 4.0–10.5)

## 2013-12-09 LAB — URINALYSIS, ROUTINE W REFLEX MICROSCOPIC
Bilirubin Urine: NEGATIVE
Glucose, UA: 500 mg/dL — AB
Hgb urine dipstick: NEGATIVE
Ketones, ur: NEGATIVE mg/dL
LEUKOCYTES UA: NEGATIVE
Nitrite: NEGATIVE
Protein, ur: NEGATIVE mg/dL
SPECIFIC GRAVITY, URINE: 1.018 (ref 1.005–1.030)
UROBILINOGEN UA: 0.2 mg/dL (ref 0.0–1.0)
pH: 5 (ref 5.0–8.0)

## 2013-12-09 LAB — PRO B NATRIURETIC PEPTIDE: Pro B Natriuretic peptide (BNP): 92.1 pg/mL (ref 0–450)

## 2013-12-09 LAB — GLUCOSE, CAPILLARY: Glucose-Capillary: 246 mg/dL — ABNORMAL HIGH (ref 70–99)

## 2013-12-09 MED ORDER — PROPRANOLOL HCL 60 MG PO TABS
60.0000 mg | ORAL_TABLET | Freq: Two times a day (BID) | ORAL | Status: DC
  Administered 2013-12-10 – 2013-12-11 (×4): 60 mg via ORAL
  Filled 2013-12-09 (×5): qty 1

## 2013-12-09 MED ORDER — MECLIZINE HCL 25 MG PO TABS
50.0000 mg | ORAL_TABLET | Freq: Two times a day (BID) | ORAL | Status: DC
  Administered 2013-12-10 – 2013-12-11 (×4): 50 mg via ORAL
  Filled 2013-12-09 (×5): qty 2

## 2013-12-09 MED ORDER — LEVOTHYROXINE SODIUM 100 MCG PO TABS
100.0000 ug | ORAL_TABLET | Freq: Every day | ORAL | Status: DC
  Administered 2013-12-10 – 2013-12-11 (×2): 100 ug via ORAL
  Filled 2013-12-09 (×3): qty 1

## 2013-12-09 MED ORDER — HEPARIN SODIUM (PORCINE) 5000 UNIT/ML IJ SOLN
5000.0000 [IU] | Freq: Three times a day (TID) | INTRAMUSCULAR | Status: DC
  Administered 2013-12-10 (×2): 5000 [IU] via SUBCUTANEOUS
  Filled 2013-12-09 (×3): qty 1

## 2013-12-09 MED ORDER — INSULIN ASPART 100 UNIT/ML ~~LOC~~ SOLN
0.0000 [IU] | SUBCUTANEOUS | Status: DC
  Administered 2013-12-10 (×3): 3 [IU] via SUBCUTANEOUS
  Administered 2013-12-10 (×2): 1 [IU] via SUBCUTANEOUS

## 2013-12-09 MED ORDER — SODIUM CHLORIDE 0.9 % IJ SOLN
3.0000 mL | Freq: Two times a day (BID) | INTRAMUSCULAR | Status: DC
  Administered 2013-12-10 – 2013-12-11 (×3): 3 mL via INTRAVENOUS

## 2013-12-09 MED ORDER — ASPIRIN EC 325 MG PO TBEC
325.0000 mg | DELAYED_RELEASE_TABLET | Freq: Every day | ORAL | Status: DC
  Administered 2013-12-10 – 2013-12-11 (×2): 325 mg via ORAL
  Filled 2013-12-09 (×2): qty 1

## 2013-12-09 MED ORDER — SODIUM CHLORIDE 0.9 % IV SOLN
INTRAVENOUS | Status: DC
  Administered 2013-12-10 – 2013-12-11 (×3): via INTRAVENOUS

## 2013-12-09 NOTE — ED Notes (Signed)
Md Pfeiffer at bedside.
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
## 2013-12-09 NOTE — ED Provider Notes (Signed)
Medical screening examination/treatment/procedure(s) were conducted as a shared visit with non-physician practitioner(s) and myself.  I personally evaluated the patient during the encounter.   EKG Interpretation None       Charlesetta Shanks, MD 12/09/13 [phone]

## 2013-12-09 NOTE — ED Provider Notes (Signed)
Medical screening examination/treatment/procedure(s) were conducted as a shared visit with non-physician practitioner(s) and myself.  I personally evaluated the patient during the encounter.   EKG Interpretation None     I reevaluated the patient and find with a syncopal episode unprovoked she is at risk for possible dysrhythmic event. The patient will be admitted for further observation monitoring. She denies any symptoms preceding or after the event.  Charlesetta Shanks, MD 12/09/13 2131

## 2013-12-10 ENCOUNTER — Encounter (HOSPITAL_COMMUNITY)
Admission: EM | Disposition: A | Discharge status: Home / Self Care | Payer: Self-pay | Source: Home / Self Care | Attending: Emergency Medicine

## 2013-12-10 ENCOUNTER — Observation Stay (HOSPITAL_COMMUNITY): Payer: Federal, State, Local not specified - PPO

## 2013-12-10 ENCOUNTER — Encounter (HOSPITAL_COMMUNITY): Payer: Self-pay | Admitting: *Deleted

## 2013-12-10 ENCOUNTER — Ambulatory Visit (HOSPITAL_COMMUNITY): Admit: 2013-12-10 | Payer: Self-pay | Admitting: Internal Medicine

## 2013-12-10 DIAGNOSIS — R55 Syncope and collapse: Secondary | ICD-10-CM

## 2013-12-10 DIAGNOSIS — R1310 Dysphagia, unspecified: Secondary | ICD-10-CM

## 2013-12-10 HISTORY — PX: LOOP RECORDER IMPLANT: SHX5477

## 2013-12-10 LAB — CBC WITH DIFFERENTIAL/PLATELET
BASOS ABS: 0 10*3/uL (ref 0.0–0.1)
BASOS PCT: 0 % (ref 0–1)
Eosinophils Absolute: 0.1 10*3/uL (ref 0.0–0.7)
Eosinophils Relative: 1 % (ref 0–5)
HEMATOCRIT: 38.5 % (ref 36.0–46.0)
Hemoglobin: 12.4 g/dL (ref 12.0–15.0)
LYMPHS PCT: 30 % (ref 12–46)
Lymphs Abs: 2.1 10*3/uL (ref 0.7–4.0)
MCH: 26.6 pg (ref 26.0–34.0)
MCHC: 32.2 g/dL (ref 30.0–36.0)
MCV: 82.4 fL (ref 78.0–100.0)
MONO ABS: 0.5 10*3/uL (ref 0.1–1.0)
Monocytes Relative: 7 % (ref 3–12)
NEUTROS ABS: 4.3 10*3/uL (ref 1.7–7.7)
NEUTROS PCT: 62 % (ref 43–77)
PLATELETS: 195 10*3/uL (ref 150–400)
RBC: 4.67 MIL/uL (ref 3.87–5.11)
RDW: 16.4 % — ABNORMAL HIGH (ref 11.5–15.5)
WBC: 7 10*3/uL (ref 4.0–10.5)

## 2013-12-10 LAB — CBC
HCT: 40.6 % (ref 36.0–46.0)
Hemoglobin: 13.2 g/dL (ref 12.0–15.0)
MCH: 26 pg (ref 26.0–34.0)
MCHC: 32.5 g/dL (ref 30.0–36.0)
MCV: 79.9 fL (ref 78.0–100.0)
Platelets: 230 10*3/uL (ref 150–400)
RBC: 5.08 MIL/uL (ref 3.87–5.11)
RDW: 16.2 % — AB (ref 11.5–15.5)
WBC: 8.5 10*3/uL (ref 4.0–10.5)

## 2013-12-10 LAB — CREATININE, SERUM
CREATININE: 0.79 mg/dL (ref 0.50–1.10)
GFR calc Af Amer: 89 mL/min — ABNORMAL LOW (ref >90)
GFR, EST NON AFRICAN AMERICAN: 77 mL/min — AB (ref >90)

## 2013-12-10 LAB — COMPREHENSIVE METABOLIC PANEL
ALBUMIN: 3 g/dL — AB (ref 3.5–5.2)
ALT: 13 U/L (ref 0–35)
ANION GAP: 13 (ref 5–15)
AST: 26 U/L (ref 0–37)
Alkaline Phosphatase: 52 U/L (ref 39–117)
BUN: 14 mg/dL (ref 6–23)
CO2: 23 mEq/L (ref 19–32)
CREATININE: 0.74 mg/dL (ref 0.50–1.10)
Calcium: 9 mg/dL (ref 8.4–10.5)
Chloride: 102 mEq/L (ref 96–112)
GFR calc Af Amer: >90 mL/min (ref >90)
GFR calc non Af Amer: 79 mL/min — ABNORMAL LOW (ref >90)
GLUCOSE: 197 mg/dL — AB (ref 70–99)
Potassium: 4.5 mEq/L (ref 3.7–5.3)
Sodium: 138 mEq/L (ref 137–147)
Total Bilirubin: 0.2 mg/dL — ABNORMAL LOW (ref 0.3–1.2)
Total Protein: 6.3 g/dL (ref 6.0–8.3)

## 2013-12-10 LAB — GLUCOSE, CAPILLARY
GLUCOSE-CAPILLARY: 150 mg/dL — AB (ref 70–99)
GLUCOSE-CAPILLARY: 203 mg/dL — AB (ref 70–99)
GLUCOSE-CAPILLARY: 222 mg/dL — AB (ref 70–99)
GLUCOSE-CAPILLARY: 137 mg/dL — AB (ref 70–99)
Glucose-Capillary: 255 mg/dL — ABNORMAL HIGH (ref 70–99)

## 2013-12-10 LAB — TROPONIN I
Troponin I: <0.3 ng/mL (ref <0.30)
Troponin I: <0.3 ng/mL (ref <0.30)

## 2013-12-10 LAB — LIPID PANEL
CHOL/HDL RATIO: 4 ratio
CHOLESTEROL: 168 mg/dL (ref 0–200)
HDL: 42 mg/dL (ref >39)
LDL CALC: 58 mg/dL (ref 0–99)
Triglycerides: 340 mg/dL — ABNORMAL HIGH (ref <150)
VLDL: 68 mg/dL — AB (ref 0–40)

## 2013-12-10 LAB — TSH: TSH: 2.65 u[IU]/mL (ref 0.350–4.500)

## 2013-12-10 LAB — HEMOGLOBIN A1C
Hgb A1c MFr Bld: 10.7 % — ABNORMAL HIGH (ref <5.7)
Mean Plasma Glucose: 260 mg/dL — ABNORMAL HIGH (ref <117)

## 2013-12-10 SURGERY — LOOP RECORDER IMPLANT
Anesthesia: LOCAL

## 2013-12-10 MED ORDER — NYSTATIN 100000 UNIT/ML MT SUSP
5.0000 mL | Freq: Four times a day (QID) | OROMUCOSAL | Status: DC
  Filled 2013-12-10 (×8): qty 5

## 2013-12-10 MED ORDER — INSULIN ASPART 100 UNIT/ML ~~LOC~~ SOLN
0.0000 [IU] | Freq: Every day | SUBCUTANEOUS | Status: DC
  Administered 2013-12-10: 3 [IU] via SUBCUTANEOUS

## 2013-12-10 MED ORDER — MECLIZINE HCL 25 MG PO TABS
25.0000 mg | ORAL_TABLET | Freq: Every day | ORAL | Status: DC
  Filled 2013-12-10: qty 1

## 2013-12-10 MED ORDER — HYDROCODONE-ACETAMINOPHEN 10-325 MG PO TABS
1.0000 | ORAL_TABLET | Freq: Four times a day (QID) | ORAL | Status: DC | PRN
  Filled 2013-12-10: qty 1

## 2013-12-10 MED ORDER — TRAMADOL HCL 50 MG PO TABS: 50.0000 mg | ORAL_TABLET | Freq: Four times a day (QID) | ORAL | Status: DC | PRN

## 2013-12-10 MED ORDER — BENZONATATE 100 MG PO CAPS
200.0000 mg | ORAL_CAPSULE | Freq: Three times a day (TID) | ORAL | Status: DC | PRN
  Filled 2013-12-10: qty 2

## 2013-12-10 MED ORDER — INSULIN ASPART 100 UNIT/ML ~~LOC~~ SOLN
0.0000 [IU] | Freq: Three times a day (TID) | SUBCUTANEOUS | Status: DC
  Administered 2013-12-11 (×2): 2 [IU] via SUBCUTANEOUS

## 2013-12-10 MED ORDER — FLUCONAZOLE 150 MG PO TABS
150.0000 mg | ORAL_TABLET | Freq: Every day | ORAL | Status: DC
  Filled 2013-12-10 (×2): qty 1
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
## 2013-12-10 NOTE — Progress Notes (Signed)
Echocardiogram 2D Echocardiogram has been performed.  Leah Levine 12/10/2013, 11:51 AM

## 2013-12-10 NOTE — Op Note (Signed)
SURGEON:  Thompson Grayer, MD     PREPROCEDURE DIAGNOSIS:  Unexplained syncope    POSTPROCEDURE DIAGNOSIS:  Unexplained syncope     PROCEDURES:   1. Implantable loop recorder implantation    INTRODUCTION:  Leah Levine is a 80 y.o. female with a history of unexplained syncope who presents today for implantable loop implantation.  Despite an extensive workup, no reversible causes have been identified.  she has worn telemetry during which she did not have arrhythmias.  There is significant concern for arrhythmia as the cause for the patients syncope.  The patient therefore presents today for implantable loop implantation.     DESCRIPTION OF PROCEDURE:  Informed written consent was obtained, and the patient was brought to the electrophysiology lab in a fasting state.  The patient required no sedation for the procedure today.  Mapping over the patient's chest was performed by the EP lab staff to identify the area where electrograms were most prominent for ILR recording.  This area was found to be the left parasternal region over the 3rd-4th intercostal space. The patients left chest was therefore prepped and draped in the usual sterile fashion by the EP lab staff. The skin overlying the left parasternal region was infiltrated with lidocaine for local analgesia.  A 0.5-cm incision was made over the left parasternal region over the 3rd intercostal space.   A Medtronic Reveal [place] model [id number] implantable loop recorder was then placed into the pocket.  2 repositions were required to obtain adequate R waves.  Finally R waves measured 0.51mV.   EBL<70ml.  Steri- Strips and a sterile dressing were then applied.  There were no early apparent complications.     CONCLUSIONS:   1. Successful implantation of a Medtronic Reveal LINQ implantable loop recorder for unexplained syncope  2. No early apparent complications.    No driving x 6 months following a syncopal episode as per DMV's  recommendation Electrophysiology team to see as needed while here. Please call with questions.
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
## 2013-12-10 NOTE — Progress Notes (Signed)
Results for SKYLAH, DELAUTER (MRN [id number]) as of 12/10/2013 11:41  Ref. Range 12/09/2013 23:19 12/10/2013 04:24 12/10/2013 07:50 12/10/2013 11:22  Glucose-Capillary Latest Range: 70-99 mg/dL 246 (H) 150 (H) 203 (H) 222 (H)  If CBGs continue to be greater than 180 mg/dl, recommend starting low dose basal insulin Lantus 10 units daily while in the hospital.  Takes Janumet at home.HgbA1C is ordered. Will continue to follow while in the hospital.  Harvel Ricks RN BSN CDE
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
## 2013-12-10 NOTE — Interval H&P Note (Signed)
History and Physical Interval Note:  12/10/2013 3:42 PM  Leah Levine  has presented today for surgery, with the diagnosis of syncope  The various methods of treatment have been discussed with the patient and family. After consideration of risks, benefits and other options for treatment, the patient has consented to  Procedure(s): LOOP RECORDER IMPLANT (N/A) as a surgical intervention .  The patient's history has been reviewed, patient examined, no change in status, stable for surgery.  I have reviewed the patient's chart and labs.  Questions were answered to the patient's satisfaction.     Thompson Grayer

## 2013-12-10 NOTE — Progress Notes (Signed)
Utilization Review Completed.Leah Levine T9/17/2015  
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
## 2013-12-11 DIAGNOSIS — Z5189 Encounter for other specified aftercare: Secondary | ICD-10-CM

## 2013-12-11 DIAGNOSIS — T671XXA Heat syncope, initial encounter: Secondary | ICD-10-CM

## 2013-12-11 LAB — COMPREHENSIVE METABOLIC PANEL
ALBUMIN: 2.9 g/dL — AB (ref 3.5–5.2)
ALT: 13 U/L (ref 0–35)
AST: 30 U/L (ref 0–37)
Alkaline Phosphatase: 49 U/L (ref 39–117)
Anion gap: 15 (ref 5–15)
BUN: 9 mg/dL (ref 6–23)
CALCIUM: 8.6 mg/dL (ref 8.4–10.5)
CO2: 23 mEq/L (ref 19–32)
Chloride: 105 mEq/L (ref 96–112)
Creatinine, Ser: 0.72 mg/dL (ref 0.50–1.10)
GFR calc non Af Amer: 80 mL/min — ABNORMAL LOW (ref >90)
GLUCOSE: 164 mg/dL — AB (ref 70–99)
Potassium: 4.4 mEq/L (ref 3.7–5.3)
SODIUM: 143 meq/L (ref 137–147)
TOTAL PROTEIN: 6 g/dL (ref 6.0–8.3)
Total Bilirubin: 0.2 mg/dL — ABNORMAL LOW (ref 0.3–1.2)

## 2013-12-11 LAB — CBC WITH DIFFERENTIAL/PLATELET
Basophils Absolute: 0 10*3/uL (ref 0.0–0.1)
Basophils Relative: 0 % (ref 0–1)
EOS ABS: 0.1 10*3/uL (ref 0.0–0.7)
Eosinophils Relative: 1 % (ref 0–5)
HCT: 37.6 % (ref 36.0–46.0)
HEMOGLOBIN: 12.2 g/dL (ref 12.0–15.0)
Lymphocytes Relative: 34 % (ref 12–46)
Lymphs Abs: 1.9 10*3/uL (ref 0.7–4.0)
MCH: 26.2 pg (ref 26.0–34.0)
MCHC: 32.4 g/dL (ref 30.0–36.0)
MCV: 80.7 fL (ref 78.0–100.0)
MONOS PCT: 7 % (ref 3–12)
Monocytes Absolute: 0.4 10*3/uL (ref 0.1–1.0)
NEUTROS ABS: 3.2 10*3/uL (ref 1.7–7.7)
NEUTROS PCT: 58 % (ref 43–77)
Platelets: 166 10*3/uL (ref 150–400)
RBC: 4.66 MIL/uL (ref 3.87–5.11)
RDW: 16.5 % — ABNORMAL HIGH (ref 11.5–15.5)
WBC: 5.6 10*3/uL (ref 4.0–10.5)

## 2013-12-11 LAB — GLUCOSE, CAPILLARY
GLUCOSE-CAPILLARY: 185 mg/dL — AB (ref 70–99)
GLUCOSE-CAPILLARY: 210 mg/dL — AB (ref 70–99)

## 2013-12-11 MED ORDER — ASPIRIN 325 MG PO TBEC: 325.0000 mg | DELAYED_RELEASE_TABLET | Freq: Every day | ORAL | Status: DC

## 2013-12-11 NOTE — Progress Notes (Signed)
OT Cancellation Note  Patient Details Name: Alaney Witter MRN: [id number] DOB: [birth year]   Cancelled Treatment:    Reason Eval/Treat Not Completed:  (Order to vestibular evaluation).  Order is for "PT/OT vestibular evaluation".  Please order for "OT eval and treat" if feel that pt would benefit from OT services.    12/11/2013 Darrol Jump OTR/L Pager [phone] Office [phone]

## 2013-12-11 NOTE — Care Management Note (Signed)
    Page 1 of 1   12/11/2013     11:51:42 AM CARE MANAGEMENT NOTE 12/11/2013  Patient:  Leah Levine, Leah Levine   Account Number:  [id number]  Date Initiated:  12/11/2013  Documentation initiated by:  GRAVES-BIGELOW,Jalyric Kaestner  Subjective/Objective Assessment:   Pt admitted for syncope. Pt is from home with family support.     Action/Plan:   Plan for d/c today and pt is agreeable to [facility] services PT/OT. Refusing HHRN at this time. CM sent referral to [facility] for services. SOC to begin within 24-48 hrs post d/c.   Anticipated DC Date:  12/11/2013   Anticipated DC Plan:  [place]  CM consult      [facility] Choice  HOME HEALTH   Choice offered to / List presented to:  C-1 Patient        [place] arranged  [place].   Status of service:  Completed, signed off Medicare Important Message given?  NO (If response is "NO", the following Medicare IM given date fields will be blank) Date Medicare IM given:   Medicare IM given by:   Date Additional Medicare IM given:   Additional Medicare IM given by:    Discharge Disposition:  [place]  Per UR Regulation:  Reviewed for med. necessity/level of care/duration of stay  If discussed at [place] of Stay Meetings, dates discussed:    Comments:
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
## 2013-12-11 NOTE — Plan of Care (Signed)
Problem: Phase I Progression Outcomes Goal: Hemodynamically stable Outcome: Progressing No acute events this shift.  Loop recorder placed 12/10/13.  Dressing is dry and intact.  Patient denies pain.  Continues to require one person assist when ambulating, unsteady gait assessed.  Will continue to monitor patient condition.

## 2013-12-11 NOTE — Progress Notes (Signed)
Pt discharged to home per MD order. Pt received and reviewed all discharge instructions and medication information including follow-up appointments and prescription information. Pt verbalized understanding. Pt alert and oriented at discharge with no complaints of pain. Pt IV and telemetry box removed prior to discharge. Pt escorted to private vehicle via wheelchair by guest services volunteer. Mariza Bourget C  
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
## 2013-12-23 ENCOUNTER — Telehealth: Payer: Self-pay | Admitting: Family

## 2013-12-23 ENCOUNTER — Ambulatory Visit: Payer: Federal, State, Local not specified - PPO

## 2013-12-23 ENCOUNTER — Ambulatory Visit (INDEPENDENT_AMBULATORY_CARE_PROVIDER_SITE_OTHER): Payer: Federal, State, Local not specified - PPO | Admitting: Family Medicine

## 2013-12-23 VITALS — BP 124/71 | HR 76 | Temp 97.4°F | Ht 60.0 in | Wt 183.0 lb

## 2013-12-23 DIAGNOSIS — B370 Candidal stomatitis: Secondary | ICD-10-CM

## 2013-12-23 MED ORDER — NYSTATIN 100000 UNIT/ML MT SUSP: 5.0000 mL | Freq: Four times a day (QID) | OROMUCOSAL | Status: DC

## 2013-12-23 MED ORDER — FLUCONAZOLE 150 MG PO TABS: ORAL_TABLET | ORAL | Status: DC

## 2013-12-23 NOTE — Telephone Encounter (Signed)
Treated for thrush 2 months ago. Has recurrence of mouth pain and tongue swelling. Appt scheduled for this afternoon. Patient aware.

## 2013-12-23 NOTE — Progress Notes (Signed)
   Subjective:    Patient ID: Leah Levine, female    DOB: 1932/08/30, 80 y.o.   MRN: [id number]  HPI This 80 y.o. female presents for evaluation of red and sore tongue.   Review of Systems C/o sore tongue. No chest pain, SOB, HA, dizziness, vision change, N/V, diarrhea, constipation, dysuria, urinary urgency or frequency, myalgias, arthralgias or rash.     Objective:   Physical Exam Vital signs noted  Well developed well nourished female.  HEENT - Head atraumatic Normocephalic                Eyes - PERRLA, Conjuctiva - clear Sclera- Clear EOMI                Ears - EAC's Wnl TM's Wnl Gross Hearing WNL                Throat - oropharanx with red and cracked tongue. Respiratory - Lungs CTA bilateral Cardiac - RRR S1 and S2 without murmur GI - Abdomen soft Nontender and bowel sounds active x 4        Assessment & Plan:  Oral candidiasis - Plan: fluconazole (DIFLUCAN) 150 MG tablet, nystatin (MYCOSTATIN) 100000 UNIT/ML suspension  Lysbeth Penner FNP

## 2013-12-28 ENCOUNTER — Ambulatory Visit (INDEPENDENT_AMBULATORY_CARE_PROVIDER_SITE_OTHER): Payer: Federal, State, Local not specified - PPO | Admitting: *Deleted

## 2013-12-28 ENCOUNTER — Encounter: Payer: Self-pay | Admitting: Internal Medicine

## 2013-12-28 DIAGNOSIS — R55 Syncope and collapse: Secondary | ICD-10-CM

## 2013-12-28 LAB — MDC_IDC_ENUM_SESS_TYPE_INCLINIC

## 2013-12-28 NOTE — Progress Notes (Signed)
ILR wound check appointment. Steri-strips removed prior to appt. Wound without redness or edema. Incision edges approximated, wound well healed. Pt with 0 tachy episodes; 0 brady episodes; 0 asystole. ROV w/ Dr. Rayann Heman in 52mo.

## 2014-01-05 ENCOUNTER — Ambulatory Visit (INDEPENDENT_AMBULATORY_CARE_PROVIDER_SITE_OTHER): Payer: Federal, State, Local not specified - PPO | Admitting: *Deleted

## 2014-01-05 DIAGNOSIS — R55 Syncope and collapse: Secondary | ICD-10-CM

## 2014-01-19 LAB — MDC_IDC_ENUM_SESS_TYPE_REMOTE

## 2014-01-22 ENCOUNTER — Encounter: Payer: Self-pay | Admitting: Cardiology

## 2014-01-22 ENCOUNTER — Other Ambulatory Visit: Payer: Self-pay | Admitting: *Deleted

## 2014-01-22 ENCOUNTER — Ambulatory Visit (INDEPENDENT_AMBULATORY_CARE_PROVIDER_SITE_OTHER): Payer: Federal, State, Local not specified - PPO | Admitting: Cardiology

## 2014-01-22 VITALS — BP 126/90 | HR 77 | Ht 60.0 in | Wt 187.0 lb

## 2014-01-22 DIAGNOSIS — I10 Essential (primary) hypertension: Secondary | ICD-10-CM

## 2014-01-22 DIAGNOSIS — I2510 Atherosclerotic heart disease of native coronary artery without angina pectoris: Secondary | ICD-10-CM

## 2014-01-22 DIAGNOSIS — R42 Dizziness and giddiness: Secondary | ICD-10-CM

## 2014-01-22 NOTE — Progress Notes (Signed)
Loop recorder 

## 2014-01-22 NOTE — Patient Instructions (Signed)
Your physician recommends that you schedule a follow-up appointment in:  6 months with Dr. Percival Spanish  We will arrange physical therapy for your dizziness

## 2014-01-22 NOTE — Progress Notes (Signed)
HPI The patient presents for evaluation of CAD/CABG.  Since she was last seen she was in the hospital. Review these records. She had a syncopal episode. There was a suggestion that this could have been related vertigo or some middle ear process. She was seen in consultation by electrophysiology because a dysrhythmia this not this is 2 recorder was implanted. She has had no further events since then. She does get dizzy. However, she has not had any presyncope or syncope. She does not have any chest pressure, neck or arm discomfort. She's not having any new shortness of breath, PND or orthopnea.  She has had no weight gain or edema.   Allergies  Allergen Reactions  . Celebrex [Celecoxib] Other (See Comments)    Muscle aches and leg pains  . Crestor [Rosuvastatin Calcium] Other (See Comments)    Muscle aches and leg pains  . Fenofibrate Other (See Comments)    Muscle aches and leg pains  . Lipitor [Atorvastatin Calcium] Other (See Comments)    Muscle aches and leg pains  . Simvastatin Other (See Comments)    Muscle aches and leg pains    Current Outpatient Prescriptions  Medication Sig Dispense Refill  . aspirin EC 325 MG EC tablet Take 1 tablet (325 mg total) by mouth daily.  30 tablet  0  . atorvastatin (LIPITOR) 80 MG tablet       . fluconazole (DIFLUCAN) 150 MG tablet Take 150 mg by mouth daily. Completed course of medication a week ago from today (12-09-13)      . fluconazole (DIFLUCAN) 150 MG tablet One po qd x 7 days  7 tablet  1  . HYDROcodone-acetaminophen (NORCO) 10-325 MG per tablet Take 1 tablet by mouth every 6 (six) hours as needed for moderate pain.      Marland Kitchen levothyroxine (SYNTHROID, LEVOTHROID) 100 MCG tablet TAKE 1 TABLET EVERY DAY  90 tablet  1  . meclizine (ANTIVERT) 25 MG tablet Take 25 mg by mouth daily.      . naproxen sodium (ANAPROX) 220 MG tablet Take 220 mg by mouth daily as needed. Pain      . nystatin (MYCOSTATIN) 100000 UNIT/ML suspension Take 5 mLs by mouth  daily.      . propranolol (INDERAL) 60 MG tablet Take 60 mg by mouth 2 (two) times daily.      . sitaGLIPtin-metformin (JANUMET) 50-1000 MG per tablet Take 1 tablet by mouth 2 (two) times daily with a meal.  60 tablet  1  . traMADol (ULTRAM) 50 MG tablet Take 50 mg by mouth every 6 (six) hours as needed for moderate pain.      . Trospium Chloride 60 MG CP24 Take 1 capsule by mouth daily.       No current facility-administered medications for this visit.    Past Medical History  Diagnosis Date  . Tremor   . GERD (gastroesophageal reflux disease)   . Hyperlipidemia   . Allergy   . Urinary incontinence   . Diabetes mellitus type 2, controlled   . Hypertension     patient denies ever having hypertension  . Personal history of colonic polyps 11/20/2010    tubular adenomas  . Hypothyroidism   . Anemia   . Skin cancer     ear, right side of cheek  . CAD (coronary artery disease) 11-2012    CABG x 4 utilizing LIMA to LAD, SVG to Diagonal, SVG to Left Circumflex, and SVG to RCA  Past Surgical History  Procedure Laterality Date  . Appendectomy    . Cholecystectomy    . Abdominal hysterectomy    . Knee arthroscopy      bilateral  . Carpal tunnel release    . Back surgery    . Gallbladder surgery    . Knees Bilateral   . Coronary artery bypass graft N/A 11/26/2012    Procedure: CORONARY ARTERY BYPASS GRAFTING (CABG);  Surgeon: Grace Isaac, MD;  Location: [facility];  Service: Open Heart Surgery;  Laterality: N/A;  . Intraoperative transesophageal echocardiogram N/A 11/26/2012    Procedure: INTRAOPERATIVE TRANSESOPHAGEAL ECHOCARDIOGRAM;  Surgeon: Grace Isaac, MD;  Location: [facility];  Service: Open Heart Surgery;  Laterality: N/A;    ROS:  As stated in the HPI and negative for all other systems.  PHYSICAL EXAM BP 126/90  Pulse 77  Ht 5' (1.524 m)  Wt 187 lb (84.823 kg)  BMI 36.52 kg/m2 GENERAL:  Well appearing NECK:  No jugular venous distention, waveform within normal  limits, carotid upstroke brisk and symmetric, no bruits, no thyromegaly LUNGS:  Clear to auscultation bilaterally BACK:  No CVA tenderness CHEST:  Well healed sternotomy scar. HEART:  PMI not displaced or sustained,S1 and S2 within normal limits, no S3, no S4, no clicks, no rubs, no murmurs ABD:  Flat, positive bowel sounds normal in frequency in pitch, no bruits, no rebound, no guarding, no midline pulsatile mass, no hepatomegaly, no splenomegaly EXT:  2 plus pulses throughout, no edema, no cyanosis no clubbing NEURO:  Nonfocal SKIN:  No rashes, no nodules   ASSESSMENT AND PLAN  CAD/CABG:  The patient has no new sypmtoms.  No further cardiovascular testing is indicated.  We will continue with aggressive risk reduction and meds as listed.  HTN:  The blood pressure is at target. No change in medications is indicated. We will continue with therapeutic lifestyle changes (TLC).  DYSLIPIDEMIA:     LDL 58 as below.  She is to follow with her PCP for treatment of this.   No change in therapy is indicated.   Lab Results  Component Value Date   CHOL 168 12/09/2013   TRIG 340* 12/09/2013   HDL 42 12/09/2013   LDLCALC 58 12/09/2013    DIZZINESS:  This does sound like it might be vertigo or noncardiac. It was suggested that she see a physical therapist as an outpatient but there were no arrangements for this main on discharge. We will try to arrange a physical therapist.  SYNCOPE:  She has had no further episodes. Her loop recorder was interrogated earlier this month and there were no events. She will have routine followup.  DM:  A1C was 10.7.  She is to followED with [facility].  I did send a note to her primary provider as her A1C was markedly elevated in the hospital.

## 2014-01-23 DIAGNOSIS — R55 Syncope and collapse: Secondary | ICD-10-CM

## 2014-01-23 DIAGNOSIS — Z7689 Persons encountering health services in other specified circumstances: Secondary | ICD-10-CM

## 2014-01-23 DIAGNOSIS — R42 Dizziness and giddiness: Secondary | ICD-10-CM

## 2014-01-28 ENCOUNTER — Encounter: Payer: Self-pay | Admitting: Internal Medicine

## 2014-01-30 ENCOUNTER — Other Ambulatory Visit: Payer: Self-pay | Admitting: Family Medicine

## 2014-01-30 ENCOUNTER — Other Ambulatory Visit: Payer: Self-pay | Admitting: Nurse Practitioner

## 2014-02-08 ENCOUNTER — Ambulatory Visit (INDEPENDENT_AMBULATORY_CARE_PROVIDER_SITE_OTHER): Payer: Federal, State, Local not specified - PPO | Admitting: *Deleted

## 2014-02-08 DIAGNOSIS — R55 Syncope and collapse: Secondary | ICD-10-CM

## 2014-02-09 LAB — MDC_IDC_ENUM_SESS_TYPE_REMOTE
Date Time Interrogation Session: 20151103045826
MDC IDC SET ZONE DETECTION INTERVAL: 2000 ms
MDC IDC SET ZONE DETECTION INTERVAL: 400 ms
Zone Setting Detection Interval: 3000 ms

## 2014-02-16 NOTE — Progress Notes (Signed)
Loop recorder 

## 2014-03-01 ENCOUNTER — Encounter: Payer: Self-pay | Admitting: Internal Medicine

## 2014-03-04 ENCOUNTER — Encounter (HOSPITAL_COMMUNITY): Payer: Self-pay | Admitting: Cardiology

## 2014-03-08 ENCOUNTER — Other Ambulatory Visit: Payer: Self-pay | Admitting: Family

## 2014-03-10 ENCOUNTER — Ambulatory Visit (INDEPENDENT_AMBULATORY_CARE_PROVIDER_SITE_OTHER): Payer: Federal, State, Local not specified - PPO | Admitting: *Deleted

## 2014-03-10 DIAGNOSIS — R55 Syncope and collapse: Secondary | ICD-10-CM

## 2014-03-11 LAB — MDC_IDC_ENUM_SESS_TYPE_REMOTE

## 2014-03-11 NOTE — Progress Notes (Signed)
Loop recorder 

## 2014-03-22 ENCOUNTER — Ambulatory Visit: Payer: Federal, State, Local not specified - PPO | Attending: Cardiology | Admitting: Physical Therapy

## 2014-03-22 DIAGNOSIS — R42 Dizziness and giddiness: Secondary | ICD-10-CM | POA: Diagnosis present
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
## 2014-03-22 NOTE — Patient Instructions (Signed)
Instructed pt. To not take meclizine prior to next scheduled PT appt. So that symptoms would not be masked.  Informed pt to bring medication with her to have to take after treatment session if she needed it.

## 2014-03-23 ENCOUNTER — Telehealth: Payer: Self-pay | Admitting: Family Medicine

## 2014-03-23 NOTE — Telephone Encounter (Signed)
Pt given appt tomorrow with Bill at 8:45.

## 2014-03-24 ENCOUNTER — Ambulatory Visit (INDEPENDENT_AMBULATORY_CARE_PROVIDER_SITE_OTHER): Payer: Federal, State, Local not specified - PPO | Admitting: Family Medicine

## 2014-03-24 VITALS — BP 142/74 | HR 87 | Temp 98.1°F | Ht 60.0 in | Wt 184.2 lb

## 2014-03-24 DIAGNOSIS — W19XXXA Unspecified fall, initial encounter: Secondary | ICD-10-CM

## 2014-03-24 DIAGNOSIS — M1990 Unspecified osteoarthritis, unspecified site: Secondary | ICD-10-CM

## 2014-03-24 DIAGNOSIS — B372 Candidiasis of skin and nail: Secondary | ICD-10-CM

## 2014-03-24 DIAGNOSIS — J206 Acute bronchitis due to rhinovirus: Secondary | ICD-10-CM

## 2014-03-24 MED ORDER — TRAMADOL HCL 50 MG PO TABS: 50.0000 mg | ORAL_TABLET | Freq: Four times a day (QID) | ORAL | Status: DC | PRN

## 2014-03-24 MED ORDER — METHYLPREDNISOLONE ACETATE 80 MG/ML IJ SUSP
80.0000 mg | Freq: Once | INTRAMUSCULAR | Status: AC
  Administered 2014-03-24: 80 mg via INTRAMUSCULAR

## 2014-03-24 MED ORDER — FLUCONAZOLE 150 MG PO TABS: 150.0000 mg | ORAL_TABLET | Freq: Once | ORAL | Status: DC

## 2014-03-24 MED ORDER — AZITHROMYCIN 250 MG PO TABS: ORAL_TABLET | ORAL | Status: DC

## 2014-03-24 MED ORDER — KETOCONAZOLE 2 % EX CREA: 1.0000 "application " | TOPICAL_CREAM | Freq: Every day | CUTANEOUS | Status: DC

## 2014-03-24 MED ORDER — LEVALBUTEROL HCL 1.25 MG/0.5ML IN NEBU
1.2500 mg | INHALATION_SOLUTION | Freq: Once | RESPIRATORY_TRACT | Status: AC
Start: 2014-03-24 — End: 2014-03-24
  Administered 2014-03-24: 1.25 mg via RESPIRATORY_TRACT

## 2014-03-24 MED ORDER — BENZONATATE 100 MG PO CAPS: 100.0000 mg | ORAL_CAPSULE | Freq: Three times a day (TID) | ORAL | Status: DC | PRN

## 2014-03-24 NOTE — Progress Notes (Signed)
   Subjective:    Patient ID: Leah Levine, female    DOB: 21-Dec-1932, 80 y.o.   MRN: [id number]  HPI Patient is here for c/o falls, cough, breast rash, and arthritis pain.  She is accompanied by sister who states she is not using her cane when she is ambulating.  She has a walker which she has borrowed from her sister.  She Does not have a walker herself.  Review of Systems  Constitutional: Negative for fever.  HENT: Negative for ear pain.   Eyes: Negative for discharge.  Respiratory: Negative for cough.   Cardiovascular: Negative for chest pain.  Gastrointestinal: Negative for abdominal distention.  Endocrine: Negative for polyuria.  Genitourinary: Negative for difficulty urinating.  Musculoskeletal: Negative for gait problem and neck pain.  Skin: Negative for color change and rash.  Neurological: Negative for speech difficulty and headaches.  Psychiatric/Behavioral: Negative for agitation.       Objective:    BP 142/74 mmHg  Pulse 87  Temp(Src) 98.1 F (36.7 C) (Oral)  Ht 5' (1.524 m)  Wt 184 lb 3.2 oz (83.553 kg)  BMI 35.97 kg/m2 Physical Exam  Constitutional: She is oriented to person, place, and time. She appears well-developed and well-nourished.  Chronically ill appearing female with walker.  HENT:  Head: Normocephalic and atraumatic.  Mouth/Throat: Oropharynx is clear and moist.  Eyes: Pupils are equal, round, and reactive to light.  Neck: Normal range of motion. Neck supple.  Cardiovascular: Normal rate and regular rhythm.   No murmur heard. Pulmonary/Chest: Effort normal. She has wheezes.  Abdominal: Soft. Bowel sounds are normal. There is no tenderness.  Musculoskeletal:  Contusion on right parietal scalp and left scalp w/o abrasion or wound.  Neurological: She is alert and oriented to person, place, and time.  Skin: Skin is warm and dry. Rash noted.  Erythematous rash under bilateral breasts  Psychiatric: She has a normal mood and affect.           Assessment & Plan:     ICD-9-CM ICD-10-CM   1. Candidal intertrigo 112.3 B37.2 fluconazole (DIFLUCAN) 150 MG tablet     ketoconazole (NIZORAL) 2 % cream  2. Acute bronchitis due to Rhinovirus 466.0 J20.6 methylPREDNISolone acetate (DEPO-MEDROL) injection 80 mg   079.3  levalbuterol (XOPENEX) nebulizer solution 1.25 mg     azithromycin (ZITHROMAX) 250 MG tablet     benzonatate (TESSALON PERLES) 100 MG capsule  3. Falls, initial encounter [phone] W19.XXXA   4. Arthritis 716.90 M19.90 traMADol (ULTRAM) 50 MG tablet   RX for seabsorb otc given  Rx for walker with seat and brakes given.  She is currently seeing PT for gait training   Discussed she needs to use walker.  Return if symptoms worsen or fail to improve.  Lysbeth Penner FNP

## 2014-03-25 ENCOUNTER — Other Ambulatory Visit: Payer: Self-pay | Admitting: Family

## 2014-03-29 ENCOUNTER — Telehealth: Payer: Self-pay | Admitting: Family Medicine

## 2014-03-29 NOTE — Telephone Encounter (Signed)
Please review and advise.

## 2014-03-30 NOTE — Telephone Encounter (Signed)
Appointment scheduled.

## 2014-03-30 NOTE — Telephone Encounter (Signed)
NTBS.

## 2014-03-31 ENCOUNTER — Ambulatory Visit (INDEPENDENT_AMBULATORY_CARE_PROVIDER_SITE_OTHER): Payer: Federal, State, Local not specified - PPO | Admitting: Family Medicine

## 2014-03-31 ENCOUNTER — Encounter: Payer: Self-pay | Admitting: *Deleted

## 2014-03-31 ENCOUNTER — Other Ambulatory Visit: Payer: Self-pay | Admitting: Nurse Practitioner

## 2014-03-31 ENCOUNTER — Encounter: Payer: Self-pay | Admitting: Family Medicine

## 2014-03-31 VITALS — BP 131/82 | HR 73 | Temp 97.0°F | Ht 60.0 in | Wt 180.0 lb

## 2014-03-31 DIAGNOSIS — K2100 Gastro-esophageal reflux disease with esophagitis, without bleeding: Secondary | ICD-10-CM

## 2014-03-31 DIAGNOSIS — J206 Acute bronchitis due to rhinovirus: Secondary | ICD-10-CM

## 2014-03-31 MED ORDER — OMEPRAZOLE 20 MG PO CPDR: 20.0000 mg | DELAYED_RELEASE_CAPSULE | Freq: Every day | ORAL | Status: DC

## 2014-03-31 MED ORDER — AMOXICILLIN 875 MG PO TABS: 875.0000 mg | ORAL_TABLET | Freq: Two times a day (BID) | ORAL | Status: DC

## 2014-03-31 MED ORDER — PREDNISONE 20 MG PO TABS: 20.0000 mg | ORAL_TABLET | Freq: Every day | ORAL | Status: DC

## 2014-03-31 MED ORDER — ALBUTEROL SULFATE HFA 108 (90 BASE) MCG/ACT IN AERS: 2.0000 | INHALATION_SPRAY | Freq: Four times a day (QID) | RESPIRATORY_TRACT | Status: DC | PRN

## 2014-03-31 MED ORDER — BENZONATATE 100 MG PO CAPS
100.0000 mg | ORAL_CAPSULE | Freq: Three times a day (TID) | ORAL | Status: DC | PRN
Start: 2014-03-31 — End: 2014-04-12

## 2014-03-31 NOTE — Progress Notes (Signed)
   Subjective:    Patient ID: Leah Levine, female    DOB: 05/01/1932, 81 y.o.   MRN: [id number]  HPI Patient c/o cough and uri sx's.  She was seen a couple weeks and given neb and depomedrol and zpak.  She states she was better for a few days and then her cough returned.  He cough is worse at HS.  Review of Systems  Constitutional: Negative for fever.  HENT: Negative for ear pain.   Eyes: Negative for discharge.  Respiratory: Negative for cough.   Cardiovascular: Negative for chest pain.  Gastrointestinal: Negative for abdominal distention.  Endocrine: Negative for polyuria.  Genitourinary: Negative for difficulty urinating.  Musculoskeletal: Negative for gait problem and neck pain.  Skin: Negative for color change and rash.  Neurological: Negative for speech difficulty and headaches.  Psychiatric/Behavioral: Negative for agitation.       Objective:    BP 131/82 mmHg  Pulse 73  Temp(Src) 97 F (36.1 C) (Oral)  Ht 5' (1.524 m)  Wt 180 lb (81.647 kg)  BMI 35.15 kg/m2 Physical Exam  Constitutional: She is oriented to person, place, and time. She appears well-developed and well-nourished.  HENT:  Head: Normocephalic and atraumatic.  Mouth/Throat: Oropharynx is clear and moist.  Eyes: Pupils are equal, round, and reactive to light.  Neck: Normal range of motion. Neck supple.  Cardiovascular: Normal rate and regular rhythm.   No murmur heard. Pulmonary/Chest: Effort normal and breath sounds normal.  Abdominal: Soft. Bowel sounds are normal. There is no tenderness.  Neurological: She is alert and oriented to person, place, and time.  Skin: Skin is warm and dry.  Psychiatric: She has a normal mood and affect.          Assessment & Plan:     ICD-9-CM ICD-10-CM   1. Acute bronchitis due to Rhinovirus 466.0 J20.6 benzonatate (TESSALON PERLES) 100 MG capsule   079.3  amoxicillin (AMOXIL) 875 MG tablet     albuterol (PROVENTIL HFA;VENTOLIN HFA) 108 (90 BASE)  MCG/ACT inhaler     predniSONE (DELTASONE) 20 MG tablet  2. Gastroesophageal reflux disease with esophagitis 530.11 K21.0 omeprazole (PRILOSEC) 20 MG capsule     No Follow-up on file.  Lysbeth Penner FNP

## 2014-04-01 ENCOUNTER — Ambulatory Visit: Payer: Federal, State, Local not specified - PPO | Attending: Cardiology | Admitting: Physical Therapy

## 2014-04-01 DIAGNOSIS — R42 Dizziness and giddiness: Secondary | ICD-10-CM | POA: Insufficient documentation

## 2014-04-08 ENCOUNTER — Ambulatory Visit (INDEPENDENT_AMBULATORY_CARE_PROVIDER_SITE_OTHER): Payer: Federal, State, Local not specified - PPO

## 2014-04-08 ENCOUNTER — Ambulatory Visit (INDEPENDENT_AMBULATORY_CARE_PROVIDER_SITE_OTHER): Payer: Federal, State, Local not specified - PPO | Admitting: Family Medicine

## 2014-04-08 ENCOUNTER — Encounter: Payer: Self-pay | Admitting: Family Medicine

## 2014-04-08 VITALS — BP 146/80 | HR 171 | Temp 98.3°F

## 2014-04-08 DIAGNOSIS — W19XXXA Unspecified fall, initial encounter: Secondary | ICD-10-CM

## 2014-04-08 DIAGNOSIS — M545 Low back pain: Secondary | ICD-10-CM

## 2014-04-08 DIAGNOSIS — M25552 Pain in left hip: Secondary | ICD-10-CM

## 2014-04-08 DIAGNOSIS — M546 Pain in thoracic spine: Secondary | ICD-10-CM

## 2014-04-08 DIAGNOSIS — M542 Cervicalgia: Secondary | ICD-10-CM

## 2014-04-08 DIAGNOSIS — M25551 Pain in right hip: Secondary | ICD-10-CM

## 2014-04-08 MED ORDER — HYDROCODONE-ACETAMINOPHEN 5-325 MG PO TABS: 1.0000 | ORAL_TABLET | Freq: Four times a day (QID) | ORAL | Status: DC | PRN

## 2014-04-08 NOTE — Progress Notes (Signed)
   Subjective:    Patient ID: Leah Levine, female    DOB: [birth year], 81 y.o.   MRN: [id number]  HPI Patient fell 2 days ago when she went outside to empty trash and she fell over and landed on her hands and chest and she did hit her right frontal area on her head but she denies LOC.  She states she could not get up so she rolled back to her home and was assisted by her daughter who states she had difficult time walking so she has to assisted. She c/o pain in her hips, entire back, neck, and arms.    Review of Systems  Constitutional: Negative for fever.  HENT: Negative for ear pain.   Eyes: Negative for discharge.  Respiratory: Negative for cough.   Cardiovascular: Negative for chest pain.  Gastrointestinal: Negative for abdominal distention.  Endocrine: Negative for polyuria.  Genitourinary: Negative for difficulty urinating.  Musculoskeletal: Negative for gait problem and neck pain.  Skin: Negative for color change and rash.  Neurological: Negative for speech difficulty and headaches.  Psychiatric/Behavioral: Negative for agitation.       Objective:    BP 146/80 mmHg  Pulse 171  Temp(Src) 98.3 F (36.8 C) (Oral)  Pulse rechecked and 60. Physical Exam  Constitutional: She is oriented to person, place, and time.  Wheel chair bound elderly female in NAD.  HENT:  Head: Normocephalic and atraumatic.  Mouth/Throat: Oropharynx is clear and moist.  Eyes: Pupils are equal, round, and reactive to light.  Neck: Normal range of motion. Neck supple.  Cardiovascular: Normal rate and regular rhythm.   No murmur heard. Pulmonary/Chest: Effort normal and breath sounds normal.  Abdominal: Soft. Bowel sounds are normal. There is tenderness.  Musculoskeletal: She exhibits tenderness.  TTP cervical spine, thoracic spine, lumbar spine, and hips.  Neurological: She is alert and oriented to person, place, and time.  Skin: Skin is warm and dry.  Psychiatric: She has a normal mood  and affect.    XRAY Cervcical, thoracic, lumbar spine, pelvis - no fractures Prelimnary reading by Iverson Alamin      Assessment & Plan:     ICD-9-CM ICD-10-CM   1. Fall, initial encounter [phone] W19.Merril Abbe DG Cervical Spine Complete     DG Thoracic Spine 2 View     DG Lumbar Spine 2-3 Views     DG HIPS BILAT WITH PELVIS 2V     HYDROcodone-acetaminophen (NORCO) 5-325 MG per tablet     No Follow-up on file.  Lysbeth Penner FNP

## 2014-04-09 ENCOUNTER — Ambulatory Visit (INDEPENDENT_AMBULATORY_CARE_PROVIDER_SITE_OTHER): Payer: Federal, State, Local not specified - PPO | Admitting: *Deleted

## 2014-04-09 DIAGNOSIS — R55 Syncope and collapse: Secondary | ICD-10-CM

## 2014-04-12 ENCOUNTER — Ambulatory Visit (INDEPENDENT_AMBULATORY_CARE_PROVIDER_SITE_OTHER): Payer: Federal, State, Local not specified - PPO | Admitting: Family

## 2014-04-12 ENCOUNTER — Encounter: Payer: Self-pay | Admitting: Family

## 2014-04-12 ENCOUNTER — Ambulatory Visit: Payer: Federal, State, Local not specified - PPO | Admitting: Family

## 2014-04-12 VITALS — BP 131/74 | HR 70 | Temp 96.9°F | Ht 60.0 in | Wt 182.6 lb

## 2014-04-12 DIAGNOSIS — Y92009 Unspecified place in unspecified non-institutional (private) residence as the place of occurrence of the external cause: Secondary | ICD-10-CM

## 2014-04-12 DIAGNOSIS — W19XXXS Unspecified fall, sequela: Secondary | ICD-10-CM

## 2014-04-12 DIAGNOSIS — R6884 Jaw pain: Secondary | ICD-10-CM

## 2014-04-12 NOTE — Patient Instructions (Signed)

## 2014-04-12 NOTE — Progress Notes (Signed)
   Subjective:    Patient ID: Leah Levine, female    DOB: 1932-11-04, 81 y.o.   MRN: [id number]  HPI Pt presents to the office for pain of left jaw. Pt fell last Tuesday and was seen in the office on Friday for pain. Pt had cervical, thoracic, lumbar, and bilateral hip x-rays. All were WNL. Pt presents to the office stating her her jaw is "sore" and can "hardly chew".    Review of Systems  Constitutional: Negative.   HENT: Negative.   Eyes: Negative.   Respiratory: Negative.  Negative for shortness of breath.   Cardiovascular: Negative.  Negative for palpitations.  Gastrointestinal: Negative.   Endocrine: Negative.   Genitourinary: Negative.   Musculoskeletal: Negative.   Neurological: Negative.  Negative for headaches.  Hematological: Negative.   Psychiatric/Behavioral: Negative.   All other systems reviewed and are negative.      Objective:   Physical Exam  Constitutional: She is oriented to person, place, and time. She appears well-developed and well-nourished. No distress.  Neck: Normal range of motion. Neck supple.  Cardiovascular: Normal rate, regular rhythm, normal heart sounds and intact distal pulses.   No murmur heard. Pulmonary/Chest: Effort normal and breath sounds normal. No respiratory distress. She has no wheezes.  Abdominal: Soft. Bowel sounds are normal. She exhibits no distension. There is no tenderness.  Musculoskeletal: Normal range of motion. She exhibits tenderness (left jaw with palpation). She exhibits no edema.  Neurological: She is alert and oriented to person, place, and time. She has normal reflexes. No cranial nerve deficit.  Skin: Skin is warm and dry.  Scattered bruising and scabs on lower legs and bilateral hips   Psychiatric: She has a normal mood and affect. Her behavior is normal. Judgment and thought content normal.  Vitals reviewed.    BP 131/74 mmHg  Pulse 70  Temp(Src) 96.9 F (36.1 C) (Oral)  Ht 5' (1.524 m)  Wt 182 lb  9.6 oz (82.827 kg)  BMI 35.66 kg/m2  *Pt's jaw from cervical x-ray- WNL     Assessment & Plan:  1. Jaw pain -Ice as needed -Continue pain medication -RTO prn2  2. Fall at home, sequela -Falls precautions discussed -Told to carry cell phone on her where ever she goes  Evelina Dun, [place]

## 2014-04-13 NOTE — Progress Notes (Signed)
Loop recorder 

## 2014-04-19 ENCOUNTER — Other Ambulatory Visit: Payer: Self-pay | Admitting: Family Medicine

## 2014-04-19 ENCOUNTER — Telehealth: Payer: Self-pay | Admitting: Family Medicine

## 2014-04-19 NOTE — Telephone Encounter (Signed)
Pt can drink ensure. Is she not eating because of nausea or no appetite? I can send in RX for nausea medication.

## 2014-04-19 NOTE — Telephone Encounter (Signed)
Done 04/08/14 for #30,

## 2014-04-20 ENCOUNTER — Other Ambulatory Visit: Payer: Self-pay | Admitting: Family Medicine

## 2014-04-20 NOTE — Telephone Encounter (Signed)
Pt aware, but didn't want to schedule appt now.

## 2014-04-20 NOTE — Telephone Encounter (Signed)
Pt is drinking and eating crackers with milk, advised ok as long as pt is staying hydrated and to try and get her to eat, pt voiced understanding and has appt 2/4 and will keep appt.

## 2014-04-20 NOTE — Telephone Encounter (Signed)
NTBS.

## 2014-04-23 ENCOUNTER — Ambulatory Visit (INDEPENDENT_AMBULATORY_CARE_PROVIDER_SITE_OTHER): Payer: Federal, State, Local not specified - PPO | Admitting: Family Medicine

## 2014-04-23 ENCOUNTER — Other Ambulatory Visit: Payer: Self-pay | Admitting: Family Medicine

## 2014-04-23 ENCOUNTER — Encounter: Payer: Self-pay | Admitting: Family Medicine

## 2014-04-23 VITALS — BP 130/78 | HR 79 | Temp 97.8°F | Ht 60.0 in | Wt 177.6 lb

## 2014-04-23 DIAGNOSIS — N189 Chronic kidney disease, unspecified: Secondary | ICD-10-CM

## 2014-04-23 DIAGNOSIS — K140 Glossitis: Secondary | ICD-10-CM

## 2014-04-23 DIAGNOSIS — E1122 Type 2 diabetes mellitus with diabetic chronic kidney disease: Secondary | ICD-10-CM

## 2014-04-23 LAB — POCT CBC
Granulocyte percent: 69.4 %G (ref 37–80)
HCT, POC: 43.8 % (ref 37.7–47.9)
Hemoglobin: 13.8 g/dL (ref 12.2–16.2)
Lymph, poc: 2.6 (ref 0.6–3.4)
MCH, POC: 25.6 pg — AB (ref 27–31.2)
MCHC: 31.5 g/dL — AB (ref 31.8–35.4)
MCV: 81.3 fL (ref 80–97)
MPV: 8.1 fL (ref 0–99.8)
POC Granulocyte: 6.4 (ref 2–6.9)
POC LYMPH PERCENT: 28.3 %L (ref 10–50)
Platelet Count, POC: 236 10*3/uL (ref 142–424)
RBC: 5.4 M/uL (ref 4.04–5.48)
RDW, POC: 16 %
WBC: 9.2 10*3/uL (ref 4.6–10.2)

## 2014-04-23 LAB — POCT GLYCOSYLATED HEMOGLOBIN (HGB A1C): Hemoglobin A1C: 9.2

## 2014-04-23 MED ORDER — NYSTATIN 100000 UNIT/ML MT SUSP: 5.0000 mL | Freq: Four times a day (QID) | OROMUCOSAL | Status: DC

## 2014-04-23 MED ORDER — FLUCONAZOLE 150 MG PO TABS: 150.0000 mg | ORAL_TABLET | Freq: Once | ORAL | Status: DC

## 2014-04-23 MED ORDER — SITAGLIPTIN PHOS-METFORMIN HCL 50-500 MG PO TABS: 1.0000 | ORAL_TABLET | Freq: Two times a day (BID) | ORAL | Status: DC

## 2014-04-23 NOTE — Progress Notes (Signed)
   Subjective:    Patient ID: Leah Levine, female    DOB: May 03, 1932, 81 y.o.   MRN: [id number]  HPI Patient c/o sore red tongue and pain with swallowing.  She has been having this for years off and on she states.  She has hx of diabetes, hypothyroid, HTN, and GERD.   Review of Systems  Constitutional: Negative for fever.  HENT: Negative for ear pain.   Eyes: Negative for discharge.  Respiratory: Negative for cough.   Cardiovascular: Negative for chest pain.  Gastrointestinal: Negative for abdominal distention.  Endocrine: Negative for polyuria.  Genitourinary: Negative for difficulty urinating.  Musculoskeletal: Negative for gait problem and neck pain.  Skin: Negative for color change and rash.  Neurological: Negative for speech difficulty and headaches.  Psychiatric/Behavioral: Negative for agitation.       Objective:    BP 130/78 mmHg  Pulse 79  Temp(Src) 97.8 F (36.6 C) (Oral)  Ht 5' (1.524 m)  Wt 177 lb 9.6 oz (80.559 kg)  BMI 34.69 kg/m2 Physical Exam  Constitutional: She is oriented to person, place, and time. She appears well-developed and well-nourished.  HENT:  Head: Normocephalic and atraumatic.  Tongue red with geographic  Eyes: Pupils are equal, round, and reactive to light.  Neck: Normal range of motion. Neck supple.  Cardiovascular: Normal rate and regular rhythm.   No murmur heard. Pulmonary/Chest: Effort normal and breath sounds normal.  Abdominal: Soft. Bowel sounds are normal. There is no tenderness.  Neurological: She is alert and oriented to person, place, and time.  Skin: Skin is warm and dry.  Psychiatric: She has a normal mood and affect.          Assessment & Plan:     ICD-9-CM ICD-10-CM   1. Glossitis 529.0 K14.0 fluconazole (DIFLUCAN) 150 MG tablet     Vitamin B6     Vitamin B3     Vitamin B1     Vitamin E     Vitamin B12     nystatin (MYCOSTATIN) 100000 UNIT/ML suspension     DISCONTINUED: nystatin (MYCOSTATIN)  100000 UNIT/ML suspension  2. Type 2 diabetes mellitus with diabetic chronic kidney disease 250.40 E11.22 POCT CBC   585.9 N18.9 POCT glycosylated hemoglobin (Hb A1C)     CMP14+EGFR     Thyroid Panel With TSH     No Follow-up on file.  Lysbeth Penner FNP

## 2014-04-27 LAB — VITAMIN B3
Nicotinamide: 20.3 ng/mL (ref 5.2–72.1)
Nicotinic Acid: <5 ng/mL (ref 0.0–5.0)

## 2014-04-27 LAB — VITAMIN B1, WHOLE BLOOD: Thiamine: 187.7 nmol/L (ref 66.5–200.0)

## 2014-04-27 LAB — THYROID PANEL WITH TSH
Free Thyroxine Index: 3 (ref 1.2–4.9)
T3 Uptake Ratio: 26 % (ref 24–39)
T4, Total: 11.6 ug/dL (ref 4.5–12.0)
TSH: 3.09 u[IU]/mL (ref 0.450–4.500)

## 2014-04-27 LAB — CMP14+EGFR
ALT: 16 IU/L (ref 0–32)
AST: 21 IU/L (ref 0–40)
Albumin/Globulin Ratio: 1.4 (ref 1.1–2.5)
Albumin: 4.2 g/dL (ref 3.5–4.8)
Alkaline Phosphatase: 59 IU/L (ref 39–117)
BUN/Creatinine Ratio: 15 (ref 11–26)
BUN: 14 mg/dL (ref 8–27)
CO2: 24 mmol/L (ref 18–29)
Calcium: 9.8 mg/dL (ref 8.7–10.3)
Chloride: 99 mmol/L (ref 97–108)
Creatinine, Ser: 0.92 mg/dL (ref 0.57–1.00)
GFR calc Af Amer: 68 mL/min/{1.73_m2} (ref >59)
GFR calc non Af Amer: 59 mL/min/{1.73_m2} — ABNORMAL LOW (ref >59)
Globulin, Total: 2.9 g/dL (ref 1.5–4.5)
Glucose: 169 mg/dL — ABNORMAL HIGH (ref 65–99)
Potassium: 4.7 mmol/L (ref 3.5–5.2)
Sodium: 139 mmol/L (ref 134–144)
Total Bilirubin: 0.5 mg/dL (ref 0.0–1.2)
Total Protein: 7.1 g/dL (ref 6.0–8.5)

## 2014-04-27 LAB — VITAMIN B6: Vitamin B6: 2.9 ug/L (ref 2.0–32.8)

## 2014-04-27 LAB — VITAMIN E: Vitamin E (Alpha Tocopherol): 15.2 mg/L (ref 4.6–17.8)

## 2014-04-27 LAB — VITAMIN B12: Vitamin B-12: 301 pg/mL (ref 211–946)

## 2014-04-29 ENCOUNTER — Other Ambulatory Visit: Payer: Self-pay | Admitting: Family Medicine

## 2014-04-29 MED ORDER — CYANOCOBALAMIN 1000 MCG/ML IJ SOLN: INTRAMUSCULAR | Status: DC

## 2014-04-29 MED ORDER — VITAMIN B-6 250 MG PO TABS: 250.0000 mg | ORAL_TABLET | Freq: Every day | ORAL | Status: DC

## 2014-05-04 LAB — MDC_IDC_ENUM_SESS_TYPE_REMOTE

## 2014-05-06 ENCOUNTER — Encounter: Payer: Self-pay | Admitting: Internal Medicine

## 2014-05-10 ENCOUNTER — Ambulatory Visit (INDEPENDENT_AMBULATORY_CARE_PROVIDER_SITE_OTHER): Payer: Federal, State, Local not specified - PPO | Admitting: *Deleted

## 2014-05-10 DIAGNOSIS — R55 Syncope and collapse: Secondary | ICD-10-CM

## 2014-05-10 LAB — MDC_IDC_ENUM_SESS_TYPE_REMOTE
MDC IDC SESS DTM: 20160220191844
MDC IDC SET ZONE DETECTION INTERVAL: 2000 ms
MDC IDC SET ZONE DETECTION INTERVAL: 3000 ms
Zone Setting Detection Interval: 400 ms

## 2014-05-11 ENCOUNTER — Ambulatory Visit (INDEPENDENT_AMBULATORY_CARE_PROVIDER_SITE_OTHER): Payer: Federal, State, Local not specified - PPO | Admitting: Nurse Practitioner

## 2014-05-11 ENCOUNTER — Encounter: Payer: Self-pay | Admitting: Nurse Practitioner

## 2014-05-11 ENCOUNTER — Telehealth: Payer: Self-pay | Admitting: Family

## 2014-05-11 ENCOUNTER — Ambulatory Visit: Payer: Federal, State, Local not specified - PPO | Admitting: Nurse Practitioner

## 2014-05-11 VITALS — BP 153/94 | HR 108 | Temp 97.8°F | Ht 60.0 in | Wt 185.0 lb

## 2014-05-11 DIAGNOSIS — B372 Candidiasis of skin and nail: Secondary | ICD-10-CM

## 2014-05-11 MED ORDER — FLUCONAZOLE 150 MG PO TABS: ORAL_TABLET | ORAL | Status: DC

## 2014-05-11 MED ORDER — NYSTATIN 100000 UNIT/GM EX CREA: 1.0000 "application " | TOPICAL_CREAM | Freq: Two times a day (BID) | CUTANEOUS | Status: DC

## 2014-05-11 NOTE — Telephone Encounter (Signed)
Stp she has appt today at 2 with MMM.

## 2014-05-11 NOTE — Progress Notes (Signed)
  Subjective:     Leah Levine is a 81 y.o. female who was referred to me for evaluation and treatment of probable candidiasis. Lesion is located on the abdominal fold and under her breast. . Symptoms include redness, moist, cream exudate. Symptoms have been ongoing for about 1 month. Previous visits for this problem: yes, last seen not sure when ago by me. Previous evaluation and treatment has included baby powder, corn starch with inadequate improvement.   The following portions of the patient's history were reviewed and updated as appropriate: allergies, current medications, past family history, past medical history, past social history, past surgical history and problem list.  Review of Systems Respiratory: negative Cardiovascular: negative Integument/breast: positive for rash, skin lesion(s) and abdominal skin fold and under her breast.  Allergic/Immunologic: negative   Objective:    Physical Exam Locations:  Skin, abdominal fold  Description:   Erythematous with malodorous exudates abdominal fold and mammary fold   chest Clear all fields  Heart  RRR       Assessment:   Cutaneous candidiasis  Plan:      Meds ordered this encounter  Medications  . fluconazole (DIFLUCAN) 150 MG tablet    Sig: 1 po qw X 4 weeks    Dispense:  4 tablet    Refill:  0    Order Specific Question:  Supervising Provider    Answer:  Chipper Herb [[id number]]  . nystatin cream (MYCOSTATIN)    Sig: Apply 1 application topically 2 (two) times daily.    Dispense:  30 g    Refill:  2    Order Specific Question:  Supervising Provider    Answer:  Chipper Herb [[id number]]   Keep area clean and dry Do not rub  Or scratch RTO if not improving  Mary-Margaret Hassell Done, FNP

## 2014-05-11 NOTE — Patient Instructions (Signed)
Cutaneous Candidiasis Cutaneous candidiasis is a condition in which there is an overgrowth of yeast (candida) on the skin. Yeast normally live on the skin, but in small enough numbers not to cause any symptoms. In certain cases, increased growth of the yeast may cause an actual yeast infection. This kind of infection usually occurs in areas of the skin that are constantly warm and moist, such as the armpits or the groin. Yeast is the most common cause of diaper rash in babies and in people who cannot control their bowel movements (incontinence). CAUSES  The fungus that most often causes cutaneous candidiasis is Candida albicans. Conditions that can increase the risk of getting a yeast infection of the skin include:  Obesity.  Pregnancy.  Diabetes.  Taking antibiotic medicine.  Taking birth control pills.  Taking steroid medicines.  Thyroid disease.  An iron or zinc deficiency.  Problems with the immune system. SYMPTOMS   Red, swollen area of the skin.  Bumps on the skin.  Itchiness. DIAGNOSIS  The diagnosis of cutaneous candidiasis is usually based on its appearance. Light scrapings of the skin may also be taken and viewed under a microscope to identify the presence of yeast. TREATMENT  Antifungal creams may be applied to the infected skin. In severe cases, oral medicines may be needed.  HOME CARE INSTRUCTIONS   Keep your skin clean and dry.  Maintain a healthy weight.  If you have diabetes, keep your blood sugar under control. SEEK IMMEDIATE MEDICAL CARE IF:  Your rash continues to spread despite treatment.  You have a fever, chills, or abdominal pain. Document Released: 11/28/2010 Document Revised: 06/04/2011 Document Reviewed: 11/28/2010 Exit[employer] Patient Information 2015 [employer]. This information is not intended to replace advice given to you by your health care provider. Make sure you discuss any questions you have with your health care provider.  

## 2014-05-11 NOTE — Telephone Encounter (Signed)
Appointment given for today with Ronnald Collum, FNP.

## 2014-05-12 NOTE — Progress Notes (Signed)
Loop recorder 

## 2014-05-13 ENCOUNTER — Telehealth: Payer: Self-pay | Admitting: Nurse Practitioner

## 2014-05-14 NOTE — Telephone Encounter (Signed)
Oral b12 will not work- only way to replace is to give injections

## 2014-05-17 ENCOUNTER — Telehealth: Payer: Self-pay | Admitting: Nurse Practitioner

## 2014-05-17 DIAGNOSIS — R251 Tremor, unspecified: Secondary | ICD-10-CM

## 2014-05-17 NOTE — Telephone Encounter (Signed)
Stp she was confused she thought she had to give herself B12 shots. Advised she would have an appt to come in here to get the B12 shots given to her by a nurse. Pt voiced understanding and will CB to schedule.

## 2014-05-17 NOTE — Telephone Encounter (Signed)
Pt aware.

## 2014-05-17 NOTE — Telephone Encounter (Signed)
Referral made to neurology. 

## 2014-05-19 ENCOUNTER — Ambulatory Visit: Payer: Federal, State, Local not specified - PPO

## 2014-05-20 ENCOUNTER — Encounter: Payer: Self-pay | Admitting: Internal Medicine

## 2014-05-24 ENCOUNTER — Ambulatory Visit (INDEPENDENT_AMBULATORY_CARE_PROVIDER_SITE_OTHER): Payer: Federal, State, Local not specified - PPO | Admitting: *Deleted

## 2014-05-24 DIAGNOSIS — E538 Deficiency of other specified B group vitamins: Secondary | ICD-10-CM

## 2014-05-24 MED ORDER — CYANOCOBALAMIN 1000 MCG/ML IJ SOLN
1000.0000 ug | Freq: Every day | INTRAMUSCULAR | Status: AC
  Administered 2014-05-24 – 2014-05-28 (×4): 1000 ug via INTRAMUSCULAR

## 2014-05-24 NOTE — Progress Notes (Signed)
Pt given B12 injection IM left deltoid, pt tolerated injection well. 

## 2014-05-24 NOTE — Patient Instructions (Signed)

## 2014-05-25 ENCOUNTER — Ambulatory Visit (INDEPENDENT_AMBULATORY_CARE_PROVIDER_SITE_OTHER): Payer: Federal, State, Local not specified - PPO | Admitting: *Deleted

## 2014-05-25 DIAGNOSIS — E538 Deficiency of other specified B group vitamins: Secondary | ICD-10-CM | POA: Diagnosis not present

## 2014-05-25 NOTE — Patient Instructions (Signed)

## 2014-05-25 NOTE — Progress Notes (Signed)
Vitamin b12 injection given and tolerated well.  

## 2014-05-26 ENCOUNTER — Ambulatory Visit (INDEPENDENT_AMBULATORY_CARE_PROVIDER_SITE_OTHER): Payer: Federal, State, Local not specified - PPO | Admitting: *Deleted

## 2014-05-26 DIAGNOSIS — E538 Deficiency of other specified B group vitamins: Secondary | ICD-10-CM | POA: Diagnosis not present

## 2014-05-26 NOTE — Patient Instructions (Signed)

## 2014-05-26 NOTE — Progress Notes (Signed)
Pt given B12 injection IM right deltoid per pt request. Pt tolerated injection well.

## 2014-05-27 ENCOUNTER — Ambulatory Visit: Payer: Federal, State, Local not specified - PPO

## 2014-05-28 ENCOUNTER — Ambulatory Visit: Payer: Federal, State, Local not specified - PPO

## 2014-05-28 ENCOUNTER — Encounter: Payer: Self-pay | Admitting: Internal Medicine

## 2014-05-28 ENCOUNTER — Ambulatory Visit (INDEPENDENT_AMBULATORY_CARE_PROVIDER_SITE_OTHER): Payer: Federal, State, Local not specified - PPO | Admitting: *Deleted

## 2014-05-28 DIAGNOSIS — E538 Deficiency of other specified B group vitamins: Secondary | ICD-10-CM | POA: Diagnosis not present

## 2014-05-28 NOTE — Patient Instructions (Signed)

## 2014-05-28 NOTE — Progress Notes (Signed)
Pt given B12 injection IM left deltoid, pt tolerated injection well. 

## 2014-06-01 ENCOUNTER — Ambulatory Visit (INDEPENDENT_AMBULATORY_CARE_PROVIDER_SITE_OTHER): Payer: Federal, State, Local not specified - PPO | Admitting: Neurology

## 2014-06-01 ENCOUNTER — Encounter: Payer: Self-pay | Admitting: Neurology

## 2014-06-01 VITALS — BP 118/84 | HR 80 | Ht 60.0 in | Wt 183.0 lb

## 2014-06-01 DIAGNOSIS — G250 Essential tremor: Secondary | ICD-10-CM

## 2014-06-01 MED ORDER — PROPRANOLOL HCL ER 80 MG PO CP24: 80.0000 mg | ORAL_CAPSULE | Freq: Every day | ORAL | Status: DC

## 2014-06-01 NOTE — Progress Notes (Signed)
Subjective:   Leah Levine was seen in consultation in the movement disorder clinic at the request of Chevis Pretty, [place].  The evaluation is for tremor.  This patient is accompanied in the office by her child who supplements the history.  The patient has previously seen Dr. Krista Blue, which was a one-time visit in July, 2014.  These records were reviewed.  Dr. Krista Blue felt that the patient had essential tremor.  She was started on primidone, 50 mg 3 times per day.  This was not titrated up.  She thinks that she took the medication and doesn't think it had side effects.  When asked how long she took it her daughter stated "a couple years" although I cannot find this in other medical records I have.    The patient was already on Inderal 60 mg twice a day when the patient saw Dr. Krista Blue.  The patient still is on Inderal, but is only on it once per day.   She has been on that for about 6 years.  Pt states that she has never taken it more than that although records indicate she was supposed to . She is not on the primidone now.  There is a family hx of tremor in her son, daughter and sister.  Pt is R hand dominant but both hands shake equally.  States that tremor has been going on at least 10 years and is gradually getting worse.    Tremor specifics:    Affected by caffeine: only drinks decaf now Affected by alcohol: doesn't drink alcohol Affected by stress:  Yes.   Affected by fatigue:  No. Spills soup if on spoon:  Yes.   Spills glass of liquid if full:  Yes.   Affects ADL's (tying shoes, brushing teeth, etc):  Very little  Previous records were reviewed.  In September, 2014 the patient presented to the hospital with chest pain and had a cardiac catheterization.  While in the hospital after that catheterization the patient began to have right-sided weakness and slurred speech.  A CT of the brain was negative but neurology was consult today and felt that she had a TIA.  She underwent a CABG during  that hospital stay.  Neurology recommended that she remain on aspirin and she was to follow-up with them, although it appears that she never did.  I did review her MRI of the brain from 12/10/2013.  There was no evidence of any discrete infarct, but there was mild to moderate small vessel disease.  Current/Previously tried tremor medications: primidone  Current medications that may exacerbate tremor:  Albuterol (only got it 3 weeks ago)  Outside reports reviewed: historical medical records and office notes.  Allergies  Allergen Reactions  . Celebrex [Celecoxib] Other (See Comments)    Muscle aches and leg pains  . Crestor [Rosuvastatin Calcium] Other (See Comments)    Muscle aches and leg pains  . Fenofibrate Other (See Comments)    Muscle aches and leg pains  . Lipitor [Atorvastatin Calcium] Other (See Comments)    Muscle aches and leg pains  . Simvastatin Other (See Comments)    Muscle aches and leg pains    Outpatient Encounter Prescriptions as of 06/01/2014  Medication Sig  . albuterol (PROVENTIL HFA;VENTOLIN HFA) 108 (90 BASE) MCG/ACT inhaler Inhale 2 puffs into the lungs every 6 (six) hours as needed for wheezing or shortness of breath.  Marland Kitchen aspirin EC 325 MG EC tablet Take 1 tablet (325 mg total) by mouth  daily.  . cyanocobalamin (,VITAMIN B-12,) 1000 MCG/ML injection Inject one ml qd x one week then q week x 4 weeks then monthly  . levothyroxine (SYNTHROID, LEVOTHROID) 100 MCG tablet TAKE 1 TABLET EVERY DAY  . meclizine (ANTIVERT) 25 MG tablet TAKE 1 TABLET (25 MG TOTAL) BY MOUTH 3 (THREE) TIMES DAILY AS NEEDED FOR DIZZINESS.  . naproxen sodium (ANAPROX) 220 MG tablet Take 220 mg by mouth daily as needed. Pain  . omeprazole (PRILOSEC) 20 MG capsule Take 1 capsule (20 mg total) by mouth daily.  . propranolol (INDERAL) 60 MG tablet Take 60 mg by mouth 2 (two) times daily.  . sitaGLIPtin-metformin (JANUMET) 50-500 MG per tablet Take 1 tablet by mouth 2 (two) times daily.  . Trospium  Chloride 60 MG CP24 TAKE 1 CAPSULE (60 MG TOTAL) BY MOUTH DAILY.  Marland Kitchen HYDROcodone-acetaminophen (NORCO) 5-325 MG per tablet Take 1 tablet by mouth every 6 (six) hours as needed for moderate pain.  . [DISCONTINUED] fluconazole (DIFLUCAN) 150 MG tablet 1 po qw X 4 weeks  . [DISCONTINUED] ketoconazole (NIZORAL) 2 % cream Apply 1 application topically daily. (Patient not taking: Reported on 05/11/2014)  . [DISCONTINUED] nystatin cream (MYCOSTATIN) Apply 1 application topically 2 (two) times daily.  . [DISCONTINUED] Pyridoxine HCl (VITAMIN B-6) 250 MG tablet Take 1 tablet (250 mg total) by mouth daily.    Past Medical History  Diagnosis Date  . Tremor   . GERD (gastroesophageal reflux disease)   . Hyperlipidemia   . Allergy   . Urinary incontinence   . Diabetes mellitus type 2, controlled   . Hypertension     patient denies ever having hypertension  . Personal history of colonic polyps 11/20/2010    tubular adenomas  . Hypothyroidism   . Anemia   . Skin cancer     ear, right side of cheek  . CAD (coronary artery disease) 11-2012    CABG x 4 utilizing LIMA to LAD, SVG to Diagonal, SVG to Left Circumflex, and SVG to RCA    Past Surgical History  Procedure Laterality Date  . Appendectomy    . Cholecystectomy    . Abdominal hysterectomy    . Knee arthroscopy      bilateral  . Carpal tunnel release    . Back surgery    . Coronary artery bypass graft N/A 11/26/2012    Procedure: CORONARY ARTERY BYPASS GRAFTING (CABG);  Surgeon: Grace Isaac, MD;  Location: [facility];  Service: Open Heart Surgery;  Laterality: N/A;  . Intraoperative transesophageal echocardiogram N/A 11/26/2012    Procedure: INTRAOPERATIVE TRANSESOPHAGEAL ECHOCARDIOGRAM;  Surgeon: Grace Isaac, MD;  Location: [facility];  Service: Open Heart Surgery;  Laterality: N/A;  . Left heart catheterization with coronary angiogram N/A 11/25/2012    Procedure: LEFT HEART CATHETERIZATION WITH CORONARY ANGIOGRAM;  Surgeon: Larey Dresser,  MD;  Location: [facility] CATH LAB;  Service: Cardiovascular;  Laterality: N/A;  . Loop recorder implant N/A 12/10/2013    Procedure: LOOP RECORDER IMPLANT;  Surgeon: Coralyn Mark, MD;  Location: [facility] CATH LAB;  Service: Cardiovascular;  Laterality: N/A;  . Neck surgery      History   Social History  . Marital Status: Widowed    Spouse Name: Shanon Brow  . Number of Children: 2  . Years of Education: 11   Occupational History  . Retired     Administrator, sports   Social History Main Topics  . Smoking status: Never Smoker   . Smokeless tobacco: Never Used  .  Alcohol Use: No  . Drug Use: No  . Sexual Activity: Not on file   Other Topics Concern  . Not on file   Social History Narrative   Lives in [place] with her grandchildren.  She is widowed.  Her husband died in July 05, 2008.      Family Status  Relation Status Death Age  . Mother Deceased 36    uterine cancer  . Father Deceased 36    pneumonia  . Brother Deceased     MI  . Sister Alive     heart disease  . Daughter Alive     DM, tremors  . Son Alive     unknown    Review of Systems A complete 10 system ROS was obtained and was negative apart from what is mentioned.   Objective:   VITALS:   Filed Vitals:   06/01/14 0932  BP: 118/84  Pulse: 80  Height: 5' (1.524 m)  Weight: 183 lb (83.008 kg)   Gen:  Appears stated age and in NAD. HEENT:  Normocephalic, atraumatic. The mucous membranes are moist. The superficial temporal arteries are without ropiness or tenderness. ;Cardiovascular: Regular rate and rhythm. Lungs: Clear to auscultation bilaterally. Neck: There are no carotid bruits noted bilaterally.  NEUROLOGICAL:  Orientation:  The patient is alert and oriented x 3.  Recent and remote memory are intact.  Attention span and concentration are normal.  Able to name objects and repeat without trouble.  Fund of knowledge is appropriate Cranial nerves: There is good facial symmetry. The pupils are equal round and reactive to light  bilaterally. Fundoscopic exam reveals clear disc margins bilaterally. Extraocular muscles are intact and visual fields are full to confrontational testing. Speech is fluent and clear. Soft palate rises symmetrically and there is no tongue deviation. Hearing is intact to conversational tone. Tone: Tone is good throughout. Sensation: Sensation is intact to light touch and pinprick throughout (facial, trunk, extremities). Vibration is decreased at the bilateral big toe. There is no extinction with double simultaneous stimulation. There is no sensory dermatomal level identified. Coordination:  The patient has no dysdiadichokinesia or dysmetria. Motor: Strength is 5/5 in the bilateral upper and lower extremities.  Shoulder shrug is equal bilaterally.  There is no pronator drift.  There are no fasciculations noted. DTR's: Deep tendon reflexes are 1/4 at the bilateral biceps, triceps, brachioradialis, patella and absent at the bilateral achilles.  Plantar responses are downgoing bilaterally. Gait and Station: The patient is slow and tenuous with ambulation.  She has decreased arm swing bilaterally.  MOVEMENT EXAM: Tremor:  There is tremor in the UE, noted most significantly with action.  The patient is able to draw Archimedes spirals without significant difficulty.  There is  tremor at rest on the left.  She spills just a little bit of water when asked to pour a full glass of water from one glass to another.  She does have chin tremor.     Assessment/Plan:   1.  Essential Tremor.  -This is evidenced by the symmetrical nature and longstanding hx of gradually getting worse.  She also has a strong family history of tremor.  -She apparently has tried primidone in the past, although there is some conflicting evidence of a long she tried it, or how much she tried.  -She is supposed to be on propranolol 60 mg twice a day, but is only taking it once per day.  I am going to cautiously change her to Inderal LA, 80  mg daily.  I suspect that she will need more than this, but I wanted to make sure that she tolerated this.  Risks, benefits, side effects and alternative therapies were discussed.  The opportunity to ask questions was given and they were answered to the best of my ability.  The patient expressed understanding and willingness to follow the outlined treatment protocols.  -Talked to her about DBS therapy, although I'm not sure that she would be a candidate.  I just wanted to give her an overview of all of the therapies for essential tremor.  Patient education was provided.  -Follow-up in the next few months, sooner should new neurologic issues arise.  Greater than 50% of the 45 minute visit spent in counseling with the patient.

## 2014-06-04 ENCOUNTER — Ambulatory Visit (INDEPENDENT_AMBULATORY_CARE_PROVIDER_SITE_OTHER): Payer: Federal, State, Local not specified - PPO | Admitting: *Deleted

## 2014-06-04 DIAGNOSIS — E538 Deficiency of other specified B group vitamins: Secondary | ICD-10-CM | POA: Diagnosis not present

## 2014-06-04 MED ORDER — CYANOCOBALAMIN 1000 MCG/ML IJ SOLN
1000.0000 ug | INTRAMUSCULAR | Status: AC
  Administered 2014-06-04 – 2014-06-30 (×4): 1000 ug via INTRAMUSCULAR

## 2014-06-04 NOTE — Patient Instructions (Signed)

## 2014-06-08 ENCOUNTER — Ambulatory Visit (INDEPENDENT_AMBULATORY_CARE_PROVIDER_SITE_OTHER): Payer: Federal, State, Local not specified - PPO | Admitting: *Deleted

## 2014-06-08 DIAGNOSIS — R55 Syncope and collapse: Secondary | ICD-10-CM

## 2014-06-10 NOTE — Progress Notes (Signed)
Loop recorder 

## 2014-06-11 ENCOUNTER — Telehealth: Payer: Self-pay | Admitting: *Deleted

## 2014-06-11 ENCOUNTER — Ambulatory Visit (INDEPENDENT_AMBULATORY_CARE_PROVIDER_SITE_OTHER): Payer: Federal, State, Local not specified - PPO | Admitting: *Deleted

## 2014-06-11 DIAGNOSIS — E538 Deficiency of other specified B group vitamins: Secondary | ICD-10-CM

## 2014-06-11 MED ORDER — TROSPIUM CHLORIDE ER 60 MG PO CP24: ORAL_CAPSULE | ORAL | Status: DC

## 2014-06-11 NOTE — Patient Instructions (Signed)

## 2014-06-11 NOTE — Progress Notes (Signed)
Pt given B12 injection IM right deltoid and tolerated well. 

## 2014-06-11 NOTE — Telephone Encounter (Signed)
[facility] requesting 90 day supply of Trospium Chloride Please review and advise

## 2014-06-11 NOTE — Telephone Encounter (Signed)
I sent the Rx to the pharmacy.

## 2014-06-15 ENCOUNTER — Other Ambulatory Visit: Payer: Self-pay

## 2014-06-15 MED ORDER — CYANOCOBALAMIN 1000 MCG/ML IJ SOLN: INTRAMUSCULAR | Status: DC

## 2014-06-22 ENCOUNTER — Ambulatory Visit (INDEPENDENT_AMBULATORY_CARE_PROVIDER_SITE_OTHER): Payer: Federal, State, Local not specified - PPO

## 2014-06-22 DIAGNOSIS — E538 Deficiency of other specified B group vitamins: Secondary | ICD-10-CM | POA: Diagnosis not present

## 2014-06-24 ENCOUNTER — Telehealth: Payer: Self-pay | Admitting: Nurse Practitioner

## 2014-06-24 LAB — MDC_IDC_ENUM_SESS_TYPE_REMOTE
Date Time Interrogation Session: 20160320160825
Zone Setting Detection Interval: 2000 ms
Zone Setting Detection Interval: 3000 ms
Zone Setting Detection Interval: 400 ms

## 2014-06-30 ENCOUNTER — Ambulatory Visit (INDEPENDENT_AMBULATORY_CARE_PROVIDER_SITE_OTHER): Payer: Federal, State, Local not specified - PPO | Admitting: *Deleted

## 2014-06-30 DIAGNOSIS — E538 Deficiency of other specified B group vitamins: Secondary | ICD-10-CM | POA: Diagnosis not present

## 2014-06-30 NOTE — Patient Instructions (Signed)

## 2014-06-30 NOTE — Progress Notes (Signed)
Vitamin b12 injection given and tolerated well.  

## 2014-07-01 NOTE — Telephone Encounter (Signed)
Called pt, she does not want. [employer] company told them

## 2014-07-02 ENCOUNTER — Encounter: Payer: Self-pay | Admitting: Internal Medicine

## 2014-07-08 ENCOUNTER — Ambulatory Visit (INDEPENDENT_AMBULATORY_CARE_PROVIDER_SITE_OTHER): Payer: Federal, State, Local not specified - PPO | Admitting: *Deleted

## 2014-07-08 DIAGNOSIS — R55 Syncope and collapse: Secondary | ICD-10-CM

## 2014-07-09 NOTE — Progress Notes (Signed)
Loop recorder 

## 2014-07-18 ENCOUNTER — Other Ambulatory Visit: Payer: Self-pay | Admitting: Family Medicine

## 2014-07-21 ENCOUNTER — Encounter: Payer: Self-pay | Admitting: Cardiology

## 2014-07-21 ENCOUNTER — Ambulatory Visit (INDEPENDENT_AMBULATORY_CARE_PROVIDER_SITE_OTHER): Payer: Federal, State, Local not specified - PPO | Admitting: Cardiology

## 2014-07-21 VITALS — BP 128/73 | HR 75 | Ht 60.0 in | Wt 180.0 lb

## 2014-07-21 DIAGNOSIS — I2510 Atherosclerotic heart disease of native coronary artery without angina pectoris: Secondary | ICD-10-CM | POA: Diagnosis not present

## 2014-07-21 DIAGNOSIS — I10 Essential (primary) hypertension: Secondary | ICD-10-CM

## 2014-07-21 NOTE — Progress Notes (Signed)
HPI The patient presents for evaluation of CAD/CABG.  Since she was last seen she has had no further syncope.  She has had an implanted loop and I looked at her recent interrogation a few days ago and there was no evidence of dysrhythmia.She has had no further events since then. She does get "blips" but she cannot quantify or qualify these further. . She has not had any presyncope or syncope. She does not have any chest pressure, neck or arm discomfort. She's not having any new shortness of breath, PND or orthopnea.  She has had no weight gain or edema.  She gets around with a cane.   Allergies  Allergen Reactions  . Celebrex [Celecoxib] Other (See Comments)    Muscle aches and leg pains  . Crestor [Rosuvastatin Calcium] Other (See Comments)    Muscle aches and leg pains  . Fenofibrate Other (See Comments)    Muscle aches and leg pains  . Lipitor [Atorvastatin Calcium] Other (See Comments)    Muscle aches and leg pains  . Simvastatin Other (See Comments)    Muscle aches and leg pains    Current Outpatient Prescriptions  Medication Sig Dispense Refill  . albuterol (PROVENTIL HFA;VENTOLIN HFA) 108 (90 BASE) MCG/ACT inhaler Inhale 2 puffs into the lungs every 6 (six) hours as needed for wheezing or shortness of breath. 1 Inhaler 0  . aspirin EC 325 MG EC tablet Take 1 tablet (325 mg total) by mouth daily. 30 tablet 0  . cyanocobalamin (,VITAMIN B-12,) 1000 MCG/ML injection Inject one ml qd x one week then q week x 4 weeks then monthly 30 mL 1  . HYDROcodone-acetaminophen (NORCO) 5-325 MG per tablet Take 1 tablet by mouth every 6 (six) hours as needed for moderate pain. 30 tablet 0  . levothyroxine (SYNTHROID, LEVOTHROID) 100 MCG tablet TAKE 1 TABLET EVERY DAY 90 tablet 1  . meclizine (ANTIVERT) 25 MG tablet TAKE 1 TABLET (25 MG TOTAL) BY MOUTH 3 (THREE) TIMES DAILY AS NEEDED FOR DIZZINESS. 30 tablet 2  . naproxen sodium (ANAPROX) 220 MG tablet Take 220 mg by mouth daily as needed. Pain      . omeprazole (PRILOSEC) 20 MG capsule Take 1 capsule (20 mg total) by mouth daily. 30 capsule 3  . propranolol (INDERAL) 60 MG tablet Take 60 mg by mouth 2 (two) times daily.    . sitaGLIPtin-metformin (JANUMET) 50-500 MG per tablet Take 1 tablet by mouth 2 (two) times daily. 180 tablet 0  . Trospium Chloride 60 MG CP24 TAKE 1 CAPSULE (60 MG TOTAL) BY MOUTH DAILY. 30 capsule 2   No current facility-administered medications for this visit.    Past Medical History  Diagnosis Date  . Tremor   . GERD (gastroesophageal reflux disease)   . Hyperlipidemia   . Allergy   . Urinary incontinence   . Diabetes mellitus type 2, controlled   . Hypertension     patient denies ever having hypertension  . Personal history of colonic polyps 11/20/2010    tubular adenomas  . Hypothyroidism   . Anemia   . Skin cancer     ear, right side of cheek  . CAD (coronary artery disease) 11-2012    CABG x 4 utilizing LIMA to LAD, SVG to Diagonal, SVG to Left Circumflex, and SVG to RCA    Past Surgical History  Procedure Laterality Date  . Appendectomy    . Cholecystectomy    . Abdominal hysterectomy    . Knee  arthroscopy      bilateral  . Carpal tunnel release    . Back surgery    . Coronary artery bypass graft N/A 11/26/2012    Procedure: CORONARY ARTERY BYPASS GRAFTING (CABG);  Surgeon: Grace Isaac, MD;  Location: [facility];  Service: Open Heart Surgery;  Laterality: N/A;  . Intraoperative transesophageal echocardiogram N/A 11/26/2012    Procedure: INTRAOPERATIVE TRANSESOPHAGEAL ECHOCARDIOGRAM;  Surgeon: Grace Isaac, MD;  Location: [facility];  Service: Open Heart Surgery;  Laterality: N/A;  . Left heart catheterization with coronary angiogram N/A 11/25/2012    Procedure: LEFT HEART CATHETERIZATION WITH CORONARY ANGIOGRAM;  Surgeon: Larey Dresser, MD;  Location: [facility] CATH LAB;  Service: Cardiovascular;  Laterality: N/A;  . Loop recorder implant N/A 12/10/2013    Procedure: LOOP RECORDER IMPLANT;  Surgeon:  Coralyn Mark, MD;  Location: [facility] CATH LAB;  Service: Cardiovascular;  Laterality: N/A;  . Neck surgery      ROS:  Back and hip pain .  Otherwise as stated in the HPI and negative for all other systems.  PHYSICAL EXAM BP 128/73 mmHg  Pulse 75  Ht 5' (1.524 m)  Wt 180 lb (81.647 kg)  BMI 35.15 kg/m2 GENERAL:  Well appearing NECK:  No jugular venous distention, waveform within normal limits, carotid upstroke brisk and symmetric, no bruits, no thyromegaly LUNGS:  Clear to auscultation bilaterally BACK:  No CVA tenderness CHEST:  Well healed sternotomy scar. HEART:  PMI not displaced or sustained,S1 and S2 within normal limits, no S3, no S4, no clicks, no rubs,  soft apical systolic murmur, no diastolicrs ABD:  Flat, positive bowel sounds normal in frequency in pitch, no bruits, no rebound, no guarding, no midline pulsatile mass, no hepatomegaly, no splenomegaly EXT:  2 plus pulses throughout, trace edema, no cyanosis no clubbing NEURO:  Nonfocal SKIN:  No rashes, no nodules   EKG: Sinus rhythm, rate 75, low voltage in the limb leads, poor anterior R wave progression, anterolateral T wave inversions new since previous.   07/21/2014   ASSESSMENT AND PLAN  CAD/CABG:  The patient has no new sypmtoms.  No further cardiovascular testing is indicated.  We will continue with aggressive risk reduction and meds as listed.  HTN:  The blood pressure is at target. No change in medications is indicated. We will continue with therapeutic lifestyle changes (TLC).  DYSLIPIDEMIA:      Per Chevis Pretty, FNP  SYNCOPE:  She has had no further episodes. Her loop recorder was interrogated earlier this month and there were no events. She will have routine followup.  DM:  A1C was 9.2 which was better than previous.  However, she knows this is not target and she says that "I am working on it."  .  She is to follow with [facility].

## 2014-07-21 NOTE — Patient Instructions (Signed)
The current medical regimen is effective;  continue present plan and medications.  Follow up in 1 year with Dr. Hochrein in [place].  You will receive a letter in the mail 2 months before you are due.  Please call us when you receive this letter to schedule your follow up appointment.  Thank you for choosing [facility]!!     

## 2014-07-26 ENCOUNTER — Telehealth: Payer: Self-pay | Admitting: Nurse Practitioner

## 2014-08-06 ENCOUNTER — Ambulatory Visit (INDEPENDENT_AMBULATORY_CARE_PROVIDER_SITE_OTHER): Payer: Federal, State, Local not specified - PPO | Admitting: *Deleted

## 2014-08-06 DIAGNOSIS — R55 Syncope and collapse: Secondary | ICD-10-CM | POA: Diagnosis not present

## 2014-08-10 LAB — CUP PACEART REMOTE DEVICE CHECK: MDC IDC SESS DTM: 20160517140316

## 2014-08-13 NOTE — Progress Notes (Signed)
Loop recorder 

## 2014-08-25 LAB — CUP PACEART REMOTE DEVICE CHECK: Date Time Interrogation Session: 20160601204221

## 2014-08-26 ENCOUNTER — Encounter: Payer: Self-pay | Admitting: Internal Medicine

## 2014-09-01 ENCOUNTER — Other Ambulatory Visit: Payer: Self-pay | Admitting: Neurosurgery

## 2014-09-01 DIAGNOSIS — M5136 Other intervertebral disc degeneration, lumbar region: Secondary | ICD-10-CM

## 2014-09-02 ENCOUNTER — Ambulatory Visit
Admission: RE | Admit: 2014-09-02 | Discharge: 2014-09-02 | Disposition: A | Discharge status: Home / Self Care | Payer: Federal, State, Local not specified - PPO | Source: Ambulatory Visit | Attending: Neurosurgery | Admitting: Neurosurgery

## 2014-09-02 ENCOUNTER — Ambulatory Visit: Payer: Federal, State, Local not specified - PPO | Admitting: Neurology

## 2014-09-02 ENCOUNTER — Telehealth: Payer: Self-pay | Admitting: Nurse Practitioner

## 2014-09-02 DIAGNOSIS — M5136 Other intervertebral disc degeneration, lumbar region: Secondary | ICD-10-CM

## 2014-09-02 DIAGNOSIS — Z029 Encounter for administrative examinations, unspecified: Secondary | ICD-10-CM

## 2014-09-02 MED ORDER — FLUCONAZOLE 150 MG PO TABS: 150.0000 mg | ORAL_TABLET | Freq: Once | ORAL | Status: DC

## 2014-09-02 MED ORDER — ONDANSETRON HCL 4 MG/2ML IJ SOLN: 4.0000 mg | Freq: Four times a day (QID) | INTRAMUSCULAR | Status: DC | PRN

## 2014-09-02 MED ORDER — IOHEXOL 180 MG/ML  SOLN
20.0000 mL | Freq: Once | INTRAMUSCULAR | Status: AC | PRN
  Administered 2014-09-02: 15 mL via INTRATHECAL

## 2014-09-02 NOTE — Telephone Encounter (Signed)
Prescription sent to pharmacy.

## 2014-09-02 NOTE — Discharge Instructions (Signed)
Myelogram Discharge Instructions  1. Go home and rest quietly for the next 24 hours.  It is important to lie flat for the next 24 hours.  Get up only to go to the restroom.  You may lie in the bed or on a couch on your back, your stomach, your left side or your right side.  You may have one pillow under your head.  You may have pillows between your knees while you are on your side or under your knees while you are on your back.  2. DO NOT drive today.  Recline the seat as far back as it will go, while still wearing your seat belt, on the way home.  3. You may get up to go to the bathroom as needed.  You may sit up for 10 minutes to eat.  You may resume your normal diet and medications unless otherwise indicated.  Drink lots of extra fluids today and tomorrow.  4. The incidence of headache, nausea, or vomiting is about 5% (one in 20 patients).  If you develop a headache, lie flat and drink plenty of fluids until the headache goes away.  Caffeinated beverages may be helpful.  If you develop severe nausea and vomiting or a headache that does not go away with flat bed rest, call [phone].  5. You may resume normal activities after your 24 hours of bed rest is over; however, do not exert yourself strongly or do any heavy lifting tomorrow. If when you get up you have a headache when standing, go back to bed and force fluids for another 24 hours.  6. Call your physician for a follow-up appointment.  The results of your myelogram will be sent directly to your physician by the following day.  7. If you have any questions or if complications develop after you arrive home, please call [phone].  Discharge instructions have been explained to the patient.  The patient, or the person responsible for the patient, fully understands these instructions.       May resume Tramadol on June, 10, 2016, after 8:30 am.

## 2014-09-02 NOTE — Progress Notes (Signed)
Patient states she has been off Tramadol for the past two days.

## 2014-09-03 ENCOUNTER — Telehealth: Payer: Self-pay | Admitting: Neurology

## 2014-09-03 ENCOUNTER — Encounter: Payer: Self-pay | Admitting: Neurology

## 2014-09-03 NOTE — Telephone Encounter (Signed)
error 

## 2014-09-03 NOTE — Telephone Encounter (Signed)
Error

## 2014-09-06 ENCOUNTER — Ambulatory Visit (INDEPENDENT_AMBULATORY_CARE_PROVIDER_SITE_OTHER): Payer: Federal, State, Local not specified - PPO | Admitting: *Deleted

## 2014-09-06 DIAGNOSIS — R55 Syncope and collapse: Secondary | ICD-10-CM

## 2014-09-07 NOTE — Progress Notes (Signed)
Loop recorder 

## 2014-09-08 ENCOUNTER — Encounter: Payer: Self-pay | Admitting: Internal Medicine

## 2014-09-13 ENCOUNTER — Other Ambulatory Visit: Payer: Self-pay | Admitting: Family

## 2014-09-13 NOTE — Telephone Encounter (Signed)
Last seen 05/11/14 MMM

## 2014-09-16 LAB — CUP PACEART REMOTE DEVICE CHECK: Date Time Interrogation Session: 20160623112614

## 2014-09-22 ENCOUNTER — Encounter: Payer: Self-pay | Admitting: Neurology

## 2014-09-22 ENCOUNTER — Ambulatory Visit (INDEPENDENT_AMBULATORY_CARE_PROVIDER_SITE_OTHER): Payer: Federal, State, Local not specified - PPO | Admitting: Neurology

## 2014-09-22 ENCOUNTER — Other Ambulatory Visit: Payer: Self-pay | Admitting: Neurology

## 2014-09-22 VITALS — BP 120/70 | HR 80 | Ht 60.0 in | Wt 174.0 lb

## 2014-09-22 DIAGNOSIS — G250 Essential tremor: Secondary | ICD-10-CM

## 2014-09-22 NOTE — Progress Notes (Signed)
Subjective:   Leah Levine was seen in consultation in the movement disorder clinic at the request of Chevis Pretty, [place].  The evaluation is for tremor.  This patient is accompanied in the office by her child who supplements the history.  The patient has previously seen Dr. Krista Blue, which was a one-time visit in July, 2014.  These records were reviewed.  Dr. Krista Blue felt that the patient had essential tremor.  She was started on primidone, 50 mg 3 times per day.  This was not titrated up.  She thinks that she took the medication and doesn't think it had side effects.  When asked how long she took it her daughter stated "a couple years" although I cannot find this in other medical records I have.    The patient was already on Inderal 60 mg twice a day when the patient saw Dr. Krista Blue.  The patient still is on Inderal, but is only on it once per day.   She has been on that for about 6 years.  Pt states that she has never taken it more than that although records indicate she was supposed to . She is not on the primidone now.  There is a family hx of tremor in her son, daughter and sister.  Pt is R hand dominant but both hands shake equally.  States that tremor has been going on at least 10 years and is gradually getting worse.    Tremor specifics:    Affected by caffeine: only drinks decaf now Affected by alcohol: doesn't drink alcohol Affected by stress:  Yes.   Affected by fatigue:  No. Spills soup if on spoon:  Yes.   Spills glass of liquid if full:  Yes.   Affects ADL's (tying shoes, brushing teeth, etc):  Very little  Previous records were reviewed.  In September, 2014 the patient presented to the hospital with chest pain and had a cardiac catheterization.  While in the hospital after that catheterization the patient began to have right-sided weakness and slurred speech.  A CT of the brain was negative but neurology was consult today and felt that she had a TIA.  She underwent a CABG during  that hospital stay.  Neurology recommended that she remain on aspirin and she was to follow-up with them, although it appears that she never did.  I did review her MRI of the brain from 12/10/2013.  There was no evidence of any discrete infarct, but there was mild to moderate small vessel disease.  09/22/14 update:  The patient is following up today, accompanied by her daughter who supplements the history.  The patient was started on Inderal LA 80 mg and was supposed to be on this once a day.  However, for some reason she is actually taking this twice a day.  She reports that she is having great tremor control and is having no side effects with the medication.  She states that she feels better than she has in over a year.  She reports that she just went to the beach with 4 of her sisters and had a great time.  No falls.  No lightheadedness or dizziness.  Current/Previously tried tremor medications: primidone  Current medications that may exacerbate tremor:  Albuterol (only got it 3 weeks ago)  Outside reports reviewed: historical medical records and office notes.  Allergies  Allergen Reactions  . Celebrex [Celecoxib] Other (See Comments)    Muscle aches and leg pains  . Fenofibrate Other (See  Comments)    Muscle aches and leg pains  . Statins Other (See Comments)    Myalgias; muscle aches and leg pains    Outpatient Encounter Prescriptions as of 09/22/2014  Medication Sig  . aspirin EC 325 MG EC tablet Take 1 tablet (325 mg total) by mouth daily.  . fluconazole (DIFLUCAN) 150 MG tablet TAKE 1 TABLET BY MOUTH ONCE  . levothyroxine (SYNTHROID, LEVOTHROID) 100 MCG tablet TAKE 1 TABLET EVERY DAY  . meclizine (ANTIVERT) 25 MG tablet TAKE 1 TABLET (25 MG TOTAL) BY MOUTH 3 (THREE) TIMES DAILY AS NEEDED FOR DIZZINESS.  . naproxen sodium (ANAPROX) 220 MG tablet Take 220 mg by mouth daily as needed. Pain  . omeprazole (PRILOSEC) 20 MG capsule Take 1 capsule (20 mg total) by mouth daily.  .  propranolol ER (INDERAL LA) 80 MG 24 hr capsule Take 80 mg by mouth 2 (two) times daily.   . sitaGLIPtin-metformin (JANUMET) 50-500 MG per tablet Take 1 tablet by mouth 2 (two) times daily.  . Trospium Chloride 60 MG CP24 TAKE 1 CAPSULE (60 MG TOTAL) BY MOUTH DAILY.  . [DISCONTINUED] propranolol (INDERAL) 60 MG tablet Take 60 mg by mouth 2 (two) times daily.  . [DISCONTINUED] albuterol (PROVENTIL HFA;VENTOLIN HFA) 108 (90 BASE) MCG/ACT inhaler Inhale 2 puffs into the lungs every 6 (six) hours as needed for wheezing or shortness of breath.  . [DISCONTINUED] cyanocobalamin (,VITAMIN B-12,) 1000 MCG/ML injection Inject one ml qd x one week then q week x 4 weeks then monthly  . [DISCONTINUED] HYDROcodone-acetaminophen (NORCO) 5-325 MG per tablet Take 1 tablet by mouth every 6 (six) hours as needed for moderate pain.   No facility-administered encounter medications on file as of 09/22/2014.    Past Medical History  Diagnosis Date  . Tremor   . GERD (gastroesophageal reflux disease)   . Hyperlipidemia   . Allergy   . Diabetes mellitus type 2, controlled   . Hypertension     patient denies ever having hypertension  . Personal history of colonic polyps 11/20/2010    tubular adenomas  . Hypothyroidism   . Anemia   . Skin cancer     ear, right side of cheek  . CAD (coronary artery disease) 11-2012    CABG x 4 utilizing LIMA to LAD, SVG to Diagonal, SVG to Left Circumflex, and SVG to RCA    Past Surgical History  Procedure Laterality Date  . Appendectomy    . Cholecystectomy    . Abdominal hysterectomy    . Knee arthroscopy      bilateral  . Carpal tunnel release    . Back surgery    . Coronary artery bypass graft N/A 11/26/2012    Procedure: CORONARY ARTERY BYPASS GRAFTING (CABG);  Surgeon: Grace Isaac, MD;  Location: [place];  Service: Open Heart Surgery;  Laterality: N/A;  . Intraoperative transesophageal echocardiogram N/A 11/26/2012    Procedure: INTRAOPERATIVE TRANSESOPHAGEAL  ECHOCARDIOGRAM;  Surgeon: Grace Isaac, MD;  Location: [place];  Service: Open Heart Surgery;  Laterality: N/A;  . Left heart catheterization with coronary angiogram N/A 11/25/2012    Procedure: LEFT HEART CATHETERIZATION WITH CORONARY ANGIOGRAM;  Surgeon: Larey Dresser, MD;  Location: [facility] CATH LAB;  Service: Cardiovascular;  Laterality: N/A;  . Loop recorder implant N/A 12/10/2013    Procedure: LOOP RECORDER IMPLANT;  Surgeon: Coralyn Mark, MD;  Location: [place] CATH LAB;  Service: Cardiovascular;  Laterality: N/A;  . Neck surgery      History  Social History  . Marital Status: Widowed    Spouse Name: Shanon Brow  . Number of Children: 2  . Years of Education: 11   Occupational History  . Retired     Administrator, sports   Social History Main Topics  . Smoking status: Never Smoker   . Smokeless tobacco: Never Used  . Alcohol Use: No  . Drug Use: No  . Sexual Activity: Not on file   Other Topics Concern  . Not on file   Social History Narrative   Lives in [place] with her grandchildren.  She is widowed.  Her husband died in 07-15-08.      Family Status  Relation Status Death Age  . Mother Deceased 59    uterine cancer  . Father Deceased 29    pneumonia  . Brother Deceased     MI  . Sister Alive     heart disease  . Daughter Alive     DM, tremors  . Son Alive     unknown    Review of Systems A complete 10 system ROS was obtained and was negative apart from what is mentioned.   Objective:   VITALS:   Filed Vitals:   09/22/14 1333  BP: 120/70  Pulse: 80  Height: 5' (1.524 m)  Weight: 174 lb (78.926 kg)   Gen:  Appears stated age and in NAD. HEENT:  Normocephalic, atraumatic. The mucous membranes are moist. The superficial temporal arteries are without ropiness or tenderness. ;Cardiovascular: Regular rate and rhythm. Lungs: Clear to auscultation bilaterally. Neck: There are no carotid bruits noted bilaterally.  NEUROLOGICAL:  Orientation:  The patient is alert and  oriented x 3.  Cranial nerves: There is good facial symmetry.  Extraocular muscles are intact and visual fields are full to confrontational testing. Speech is fluent and clear. Soft palate rises symmetrically and there is no tongue deviation. Hearing is intact to conversational tone. Tone: Tone is good throughout. Sensation: Sensation is intact to light touch throughout. Coordination:  The patient has no dysdiadichokinesia or dysmetria. Motor: Strength is 5/5 in the bilateral upper and lower extremities.  Shoulder shrug is equal bilaterally.  There is no pronator drift.  There are no fasciculations noted. Gait and Station: The patient is slow but she is fairly stable when she walks down the hall.  MOVEMENT EXAM: Tremor:  There is almost no postural tremor.  She has minimal intention tremor.  She has mild tremor on the left with Archimedes spirals, but very little on the right.  She does have chin tremor.     Assessment/Plan:   1.  Essential Tremor.  -This is evidenced by the symmetrical nature and longstanding hx of gradually getting worse.  She also has a strong family history of tremor.  -She apparently has tried primidone in the past, although there is some conflicting evidence of a long she tried it, or how much she tried.  -She is supposed to be on Inderal LA, 80 mg daily but is actually taking it twice a day.  Fortunately, she is not having side effects and feels great on the medication.  She does not want to change it.  She will let me know if she has any lightheadedness or dizziness. 2.  Follow-up in 6 months.

## 2014-09-29 ENCOUNTER — Encounter: Payer: Self-pay | Admitting: Internal Medicine

## 2014-10-06 ENCOUNTER — Ambulatory Visit (INDEPENDENT_AMBULATORY_CARE_PROVIDER_SITE_OTHER): Payer: Federal, State, Local not specified - PPO | Admitting: *Deleted

## 2014-10-06 DIAGNOSIS — R55 Syncope and collapse: Secondary | ICD-10-CM

## 2014-10-07 NOTE — Progress Notes (Signed)
Loop recorder 

## 2014-10-15 LAB — CUP PACEART REMOTE DEVICE CHECK: Date Time Interrogation Session: 20160722144144

## 2014-10-28 ENCOUNTER — Other Ambulatory Visit: Payer: Self-pay | Admitting: Neurosurgery

## 2014-10-28 DIAGNOSIS — M48061 Spinal stenosis, lumbar region without neurogenic claudication: Secondary | ICD-10-CM

## 2014-10-29 ENCOUNTER — Ambulatory Visit (INDEPENDENT_AMBULATORY_CARE_PROVIDER_SITE_OTHER): Payer: Federal, State, Local not specified - PPO | Admitting: Nurse Practitioner

## 2014-10-29 ENCOUNTER — Encounter: Payer: Self-pay | Admitting: Nurse Practitioner

## 2014-10-29 VITALS — BP 133/83 | HR 71 | Temp 97.9°F | Ht 60.0 in | Wt 174.0 lb

## 2014-10-29 DIAGNOSIS — H8110 Benign paroxysmal vertigo, unspecified ear: Secondary | ICD-10-CM | POA: Diagnosis not present

## 2014-10-29 DIAGNOSIS — R739 Hyperglycemia, unspecified: Secondary | ICD-10-CM | POA: Diagnosis not present

## 2014-10-29 MED ORDER — SITAGLIPTIN PHOS-METFORMIN HCL 50-500 MG PO TABS: 1.0000 | ORAL_TABLET | Freq: Two times a day (BID) | ORAL | Status: DC

## 2014-10-29 NOTE — Patient Instructions (Signed)
Benign Positional Vertigo Vertigo means you feel like you or your surroundings are moving when they are not. Benign positional vertigo is the most common form of vertigo. Benign means that the cause of your condition is not serious. Benign positional vertigo is more common in older adults. CAUSES  Benign positional vertigo is the result of an upset in the labyrinth system. This is an area in the middle ear that helps control your balance. This may be caused by a viral infection, head injury, or repetitive motion. However, often no specific cause is found. SYMPTOMS  Symptoms of benign positional vertigo occur when you move your head or eyes in different directions. Some of the symptoms may include:  Loss of balance and falls.  Vomiting.  Blurred vision.  Dizziness.  Nausea.  Involuntary eye movements (nystagmus). DIAGNOSIS  Benign positional vertigo is usually diagnosed by physical exam. If the specific cause of your benign positional vertigo is unknown, your caregiver may perform imaging tests, such as magnetic resonance imaging (MRI) or computed tomography (CT). TREATMENT  Your caregiver may recommend movements or procedures to correct the benign positional vertigo. Medicines such as meclizine, benzodiazepines, and medicines for nausea may be used to treat your symptoms. In rare cases, if your symptoms are caused by certain conditions that affect the inner ear, you may need surgery. HOME CARE INSTRUCTIONS   Follow your caregiver's instructions.  Move slowly. Do not make sudden body or head movements.  Avoid driving.  Avoid operating heavy machinery.  Avoid performing any tasks that would be dangerous to you or others during a vertigo episode.  Drink enough fluids to keep your urine clear or pale yellow. SEEK IMMEDIATE MEDICAL CARE IF:   You develop problems with walking, weakness, numbness, or using your arms, hands, or legs.  You have difficulty speaking.  You develop  severe headaches.  Your nausea or vomiting continues or gets worse.  You develop visual changes.  Your family or friends notice any behavioral changes.  Your condition gets worse.  You have a fever.  You develop a stiff neck or sensitivity to light. MAKE SURE YOU:   Understand these instructions.  Will watch your condition.  Will get help right away if you are not doing well or get worse. Document Released: 12/18/2005 Document Revised: 06/04/2011 Document Reviewed: 11/30/2010 Exit[employer] Patient Information 2015 [employer]. This information is not intended to replace advice given to you by your health care provider. Make sure you discuss any questions you have with your health care provider.    

## 2014-10-29 NOTE — Progress Notes (Signed)
   Subjective:    Patient ID: Leah Levine, female    DOB: 12/31/1932, 81 y.o.   MRN: [id number]  HPI Patient in c/o dizziness and fatigue for 3 weeks- mainly occurs when she goes from sitting to standing. The dizziness last several  Minutes sometimes. Worse when walking. She has been taking meclizine which helps some.  * rash under bil breasts - itchy and burning.  Review of Systems  HENT: Negative.   Respiratory: Negative.   Cardiovascular: Negative.   Genitourinary: Negative.   Neurological: Positive for dizziness and weakness.  Psychiatric/Behavioral: Negative.   All other systems reviewed and are negative.      Objective:   Physical Exam  Constitutional: She is oriented to person, place, and time. She appears well-developed and well-nourished.  Cardiovascular: Normal rate, regular rhythm and normal heart sounds.   Pulmonary/Chest: Effort normal and breath sounds normal.  Neurological: She is alert and oriented to person, place, and time.  Skin: Skin is warm. There is erythema (moist rash bil mammory fold).   BP 133/83 mmHg  Pulse 71  Temp(Src) 97.9 F (36.6 C) (Oral)  Ht 5' (1.524 m)  Wt 174 lb (78.926 kg)  BMI 33.98 kg/m2        Assessment & Plan:   1. Vertigo, benign positional, unspecified laterality    Orders Placed This Encounter  Procedures  . BMP8+EGFR  . Anemia Profile B  . Ambulatory referral to Neurology    Referral Priority:  Routine    Referral Type:  Consultation    Referral Reason:  Specialty Services Required    Requested Specialty:  Neurology    Number of Visits Requested:  1   Meds ordered this encounter  Medications  . sitaGLIPtin-metformin (JANUMET) 50-500 MG per tablet    Sig: Take 1 tablet by mouth 2 (two) times daily.    Dispense:  60 tablet    Refill:  0    Order Specific Question:  Supervising Provider    Answer:  Chipper Herb [[id number]]   Need to make appointment for follow up of chronic medical  problems  [place], FNP

## 2014-10-30 ENCOUNTER — Other Ambulatory Visit: Payer: Self-pay | Admitting: Nurse Practitioner

## 2014-10-30 LAB — ANEMIA PROFILE B
Basophils Absolute: 0 10*3/uL (ref 0.0–0.2)
Basos: 0 %
EOS (ABSOLUTE): 0.2 10*3/uL (ref 0.0–0.4)
EOS: 3 %
FERRITIN: 188 ng/mL — AB (ref 15–150)
FOLATE: 14.7 ng/mL (ref >3.0)
HEMATOCRIT: 42.6 % (ref 34.0–46.6)
Hemoglobin: 13.5 g/dL (ref 11.1–15.9)
IMMATURE GRANS (ABS): 0 10*3/uL (ref 0.0–0.1)
IRON: 57 ug/dL (ref 27–139)
Immature Granulocytes: 0 %
Iron Saturation: 20 % (ref 15–55)
LYMPHS ABS: 2.1 10*3/uL (ref 0.7–3.1)
Lymphs: 29 %
MCH: 26.3 pg — AB (ref 26.6–33.0)
MCHC: 31.7 g/dL (ref 31.5–35.7)
MCV: 83 fL (ref 79–97)
Monocytes Absolute: 0.4 10*3/uL (ref 0.1–0.9)
Monocytes: 6 %
NEUTROS ABS: 4.3 10*3/uL (ref 1.4–7.0)
Neutrophils: 62 %
Platelets: 218 10*3/uL (ref 150–379)
RBC: 5.14 x10E6/uL (ref 3.77–5.28)
RDW: 16.3 % — AB (ref 12.3–15.4)
Retic Ct Pct: 1.8 % (ref 0.6–2.6)
Total Iron Binding Capacity: 286 ug/dL (ref 250–450)
UIBC: 229 ug/dL (ref 118–369)
VITAMIN B 12: 527 pg/mL (ref 211–946)
WBC: 7.1 10*3/uL (ref 3.4–10.8)

## 2014-10-30 LAB — BMP8+EGFR
BUN/Creatinine Ratio: 16 (ref 11–26)
BUN: 13 mg/dL (ref 8–27)
CO2: 26 mmol/L (ref 18–29)
Calcium: 9.5 mg/dL (ref 8.7–10.3)
Chloride: 93 mmol/L — ABNORMAL LOW (ref 97–108)
Creatinine, Ser: 0.82 mg/dL (ref 0.57–1.00)
GFR calc Af Amer: 79 mL/min/{1.73_m2} (ref >59)
GFR calc non Af Amer: 68 mL/min/{1.73_m2} (ref >59)
Glucose: 384 mg/dL — ABNORMAL HIGH (ref 65–99)
Potassium: 4.6 mmol/L (ref 3.5–5.2)
Sodium: 133 mmol/L — ABNORMAL LOW (ref 134–144)

## 2014-10-30 LAB — SPECIMEN STATUS REPORT

## 2014-10-30 NOTE — Progress Notes (Signed)
Added A1C per MMM for high glucose, pt aware. Checked BS at home fasting was 352. Pt has only been taking Janumet qd, should be bid. Pt will call back with her blood sugars on Monday.

## 2014-10-30 NOTE — Addendum Note (Signed)
Addended byCarrolyn Leigh on: 10/30/2014 08:50 AM   Modules accepted: Orders

## 2014-11-01 LAB — HGB A1C W/O EAG: Hgb A1c MFr Bld: 13 % — ABNORMAL HIGH (ref 4.8–5.6)

## 2014-11-01 LAB — SPECIMEN STATUS REPORT

## 2014-11-02 ENCOUNTER — Ambulatory Visit
Admission: RE | Admit: 2014-11-02 | Discharge: 2014-11-02 | Disposition: A | Discharge status: Home / Self Care | Payer: Federal, State, Local not specified - PPO | Source: Ambulatory Visit | Attending: Neurosurgery | Admitting: Neurosurgery

## 2014-11-02 ENCOUNTER — Telehealth: Payer: Self-pay | Admitting: *Deleted

## 2014-11-02 ENCOUNTER — Telehealth: Payer: Self-pay | Admitting: Nurse Practitioner

## 2014-11-02 DIAGNOSIS — M48061 Spinal stenosis, lumbar region without neurogenic claudication: Secondary | ICD-10-CM

## 2014-11-02 MED ORDER — IOHEXOL 180 MG/ML  SOLN
1.0000 mL | Freq: Once | INTRAMUSCULAR | Status: DC | PRN
  Administered 2014-11-02: 1 mL via EPIDURAL

## 2014-11-02 MED ORDER — METHYLPREDNISOLONE ACETATE 40 MG/ML INJ SUSP (RADIOLOG
120.0000 mg | Freq: Once | INTRAMUSCULAR | Status: AC
  Administered 2014-11-02: 120 mg via EPIDURAL

## 2014-11-02 NOTE — Telephone Encounter (Signed)
Daughter called to let us know that since patient has been taking Janumet twice daily that her BS was doing better. It was 260 today. Advised daughter to continue monitoring and to continue with meds and let us know next Monday what her numbers are. Advised if bs started rising or become really low to call us as well

## 2014-11-02 NOTE — Discharge Instructions (Signed)

## 2014-11-03 NOTE — Telephone Encounter (Signed)
Patient aware to try miralax on a daily basis, use apple juice, or prune juice. She did have a bowel movement last night around 11:30 and if this does not improve to give [place] a call back.

## 2014-11-04 ENCOUNTER — Encounter: Payer: Self-pay | Admitting: Internal Medicine

## 2014-11-05 ENCOUNTER — Ambulatory Visit (INDEPENDENT_AMBULATORY_CARE_PROVIDER_SITE_OTHER): Payer: Federal, State, Local not specified - PPO

## 2014-11-05 DIAGNOSIS — R55 Syncope and collapse: Secondary | ICD-10-CM

## 2014-11-07 ENCOUNTER — Other Ambulatory Visit: Payer: Self-pay | Admitting: Nurse Practitioner

## 2014-11-09 NOTE — Progress Notes (Signed)
Loop recorder 

## 2014-11-11 ENCOUNTER — Encounter: Payer: Self-pay | Admitting: Nurse Practitioner

## 2014-11-11 ENCOUNTER — Ambulatory Visit (INDEPENDENT_AMBULATORY_CARE_PROVIDER_SITE_OTHER): Payer: Federal, State, Local not specified - PPO | Admitting: Nurse Practitioner

## 2014-11-11 VITALS — BP 131/77 | HR 60 | Temp 97.6°F | Ht 60.0 in | Wt 165.0 lb

## 2014-11-11 DIAGNOSIS — K219 Gastro-esophageal reflux disease without esophagitis: Secondary | ICD-10-CM

## 2014-11-11 DIAGNOSIS — R1310 Dysphagia, unspecified: Secondary | ICD-10-CM

## 2014-11-11 DIAGNOSIS — E1165 Type 2 diabetes mellitus with hyperglycemia: Secondary | ICD-10-CM | POA: Diagnosis not present

## 2014-11-11 DIAGNOSIS — IMO0002 Reserved for concepts with insufficient information to code with codable children: Secondary | ICD-10-CM

## 2014-11-11 DIAGNOSIS — I10 Essential (primary) hypertension: Secondary | ICD-10-CM | POA: Diagnosis not present

## 2014-11-11 DIAGNOSIS — R1319 Other dysphagia: Secondary | ICD-10-CM

## 2014-11-11 DIAGNOSIS — E538 Deficiency of other specified B group vitamins: Secondary | ICD-10-CM | POA: Diagnosis not present

## 2014-11-11 DIAGNOSIS — R251 Tremor, unspecified: Secondary | ICD-10-CM | POA: Diagnosis not present

## 2014-11-11 DIAGNOSIS — E039 Hypothyroidism, unspecified: Secondary | ICD-10-CM

## 2014-11-11 DIAGNOSIS — E785 Hyperlipidemia, unspecified: Secondary | ICD-10-CM | POA: Diagnosis not present

## 2014-11-11 DIAGNOSIS — K2100 Gastro-esophageal reflux disease with esophagitis, without bleeding: Secondary | ICD-10-CM

## 2014-11-11 DIAGNOSIS — Z951 Presence of aortocoronary bypass graft: Secondary | ICD-10-CM | POA: Diagnosis not present

## 2014-11-11 DIAGNOSIS — R1314 Dysphagia, pharyngoesophageal phase: Secondary | ICD-10-CM | POA: Diagnosis not present

## 2014-11-11 DIAGNOSIS — I2510 Atherosclerotic heart disease of native coronary artery without angina pectoris: Secondary | ICD-10-CM | POA: Diagnosis not present

## 2014-11-11 LAB — POCT UA - MICROALBUMIN: Microalbumin Ur, POC: NEGATIVE mg/L

## 2014-11-11 LAB — GLUCOSE, POCT (MANUAL RESULT ENTRY): POC Glucose: 254 mg/dl — AB (ref 70–99)

## 2014-11-11 LAB — POCT GLYCOSYLATED HEMOGLOBIN (HGB A1C): Hemoglobin A1C: 13

## 2014-11-11 MED ORDER — PROPRANOLOL HCL ER 80 MG PO CP24: 80.0000 mg | ORAL_CAPSULE | Freq: Two times a day (BID) | ORAL | Status: DC

## 2014-11-11 MED ORDER — GLIPIZIDE 5 MG PO TABS
5.0000 mg | ORAL_TABLET | Freq: Two times a day (BID) | ORAL | Status: DC
Start: 2014-11-11 — End: 2014-12-28

## 2014-11-11 MED ORDER — OMEPRAZOLE 20 MG PO CPDR: 20.0000 mg | DELAYED_RELEASE_CAPSULE | Freq: Every day | ORAL | Status: DC

## 2014-11-11 MED ORDER — SITAGLIPTIN PHOS-METFORMIN HCL 50-500 MG PO TABS: 1.0000 | ORAL_TABLET | Freq: Two times a day (BID) | ORAL | Status: DC

## 2014-11-11 MED ORDER — LEVOTHYROXINE SODIUM 100 MCG PO TABS: 100.0000 ug | ORAL_TABLET | Freq: Every day | ORAL | Status: DC

## 2014-11-11 NOTE — Progress Notes (Signed)
Subjective:    Patient ID: Leah Levine, female    DOB: 1933-01-09, 81 y.o.   MRN: [id number]   Patient here today for follow up of chronic medical problems.   Hypertension This is a chronic problem. The current episode started more than 1 year ago. The problem is unchanged. The problem is controlled. Pertinent negatives include no anxiety, chest pain, headaches, palpitations, peripheral edema or shortness of breath. Risk factors for coronary artery disease include diabetes mellitus, dyslipidemia, obesity, post-menopausal state and sedentary lifestyle. Past treatments include nothing. The current treatment provides significant improvement. Hypertensive end-organ damage includes CAD/MI and a thyroid problem. There is no history of CVA or retinopathy.  Hyperlipidemia This is a chronic problem. The current episode started more than 1 year ago. Recent lipid tests were reviewed and are variable. Exacerbating diseases include diabetes and obesity. She has no history of hypothyroidism. Pertinent negatives include no chest pain or shortness of breath. Treatments tried: refuses statin. The current treatment provides mild improvement of lipids. Risk factors for coronary artery disease include diabetes mellitus, dyslipidemia, hypertension, obesity and post-menopausal.  Diabetes She presents for her follow-up diabetic visit. She has type 2 diabetes mellitus. No MedicAlert identification noted. There are no hypoglycemic associated symptoms. Pertinent negatives for hypoglycemia include no headaches. Pertinent negatives for diabetes include no chest pain, no foot ulcerations, no polydipsia, no polyphagia and no polyuria. Symptoms are stable. There are no diabetic complications. Pertinent negatives for diabetic complications include no CVA or retinopathy. Risk factors for coronary artery disease include diabetes mellitus, dyslipidemia, hypertension, obesity and post-menopausal. Current diabetic treatment  includes oral agent (dual therapy). Her weight is stable. When asked about meal planning, she reported none. She has not had a previous visit with a dietitian. She rarely participates in exercise. Her breakfast blood glucose is taken between 8-9 am. Her breakfast blood glucose range is generally 110-130 mg/dl. Her highest blood glucose is 140-180 mg/dl. Her overall blood glucose range is 110-130 mg/dl. An ACE inhibitor/angiotensin II receptor blocker is not being taken. She does not see a podiatrist.Eye exam is not current.  Thyroid Problem Presents for follow-up visit. Patient reports no palpitations. The symptoms have been stable. Her past medical history is significant for diabetes and hyperlipidemia.  CAD/CABG On propranolol and is working well- no c/o chest pain GERD Omeprazole daily- keeps symptoms under control Vertigo Has appointment with neurologist in 2 weeks- taking antivert which helps some.      Review of Systems  Respiratory: Negative for shortness of breath.   Cardiovascular: Negative for chest pain and palpitations.  Endocrine: Negative for polydipsia, polyphagia and polyuria.  Neurological: Negative for headaches.       Objective:   Physical Exam  Constitutional: She is oriented to person, place, and time. She appears well-developed and well-nourished.  HENT:  Nose: Nose normal.  Mouth/Throat: Oropharynx is clear and moist.  Eyes: EOM are normal.  Neck: Trachea normal, normal range of motion and full passive range of motion without pain. Neck supple. No JVD present. Carotid bruit is not present. No thyromegaly present.  Cardiovascular: Normal rate, regular rhythm, normal heart sounds and intact distal pulses.  Exam reveals no gallop and no friction rub.   No murmur heard. Pulmonary/Chest: Effort normal and breath sounds normal.  Abdominal: Soft. Bowel sounds are normal. She exhibits no distension and no mass. There is no tenderness.  Musculoskeletal: Normal range of  motion.  Lymphadenopathy:    She has no cervical adenopathy.  Neurological: She  is alert and oriented to person, place, and time. She has normal reflexes.  Skin: Skin is warm and dry.  Psychiatric: She has a normal mood and affect. Her behavior is normal. Judgment and thought content normal.   BP 131/77 mmHg  Pulse 60  Temp(Src) 97.6 F (36.4 C) (Oral)  Ht 5' (1.524 m)  Wt 165 lb (74.844 kg)  BMI 32.22 kg/m2  Results for orders placed or performed in visit on 11/11/14  POCT glycosylated hemoglobin (Hb A1C)  Result Value Ref Range   Hemoglobin A1C 13.0   POCT UA - Microalbumin  Result Value Ref Range   Microalbumin Ur, POC neg mg/L  POCT glucose (manual entry)  Result Value Ref Range   POC Glucose 254 (A) 70 - 99 mg/dl         Assessment & Plan:  1. Essential hypertension Do not add salt to diet - CMP14+EGFR  2. Coronary artery disease involving native coronary artery of native heart without angina pectoris Keep follow up appointment with cardiology - propranolol ER (INDERAL LA) 80 MG 24 hr capsule; Take 1 capsule (80 mg total) by mouth 2 (two) times daily.  Dispense: 60 capsule; Refill: 5  3. Vitamin B12 deficiency  4. Gastroesophageal reflux disease without esophagitis Avoid spicy foods Do not eat 2 hours prior to bedtime   5. Esophageal dysphagia Chew foods well before swallowing  6. Hypothyroidism, unspecified hypothyroidism type - Thyroid Panel With TSH - levothyroxine (SYNTHROID, LEVOTHROID) 100 MCG tablet; Take 1 tablet (100 mcg total) by mouth daily.  Dispense: 90 tablet; Refill: 1  7. Tremor  8. S/P CABG x 4  9. Hyperlipidemia Low fta diet - Lipid panel  10. Gastroesophageal reflux disease with esophagitis - omeprazole (PRILOSEC) 20 MG capsule; Take 1 capsule (20 mg total) by mouth daily.  Dispense: 30 capsule; Refill: 3  11. Diabetes mellitus type 2, uncontrolled Stricter carb counting - POCT glycosylated hemoglobin (Hb A1C) - POCT UA -  Microalbumin - POCT glucose (manual entry) - sitaGLIPtin-metformin (JANUMET) 50-500 MG per tablet; Take 1 tablet by mouth 2 (two) times daily.  Dispense: 60 tablet; Refill: 0 - glipiZIDE (GLUCOTROL) 5 MG tablet; Take 1 tablet (5 mg total) by mouth 2 (two) times daily before a meal.  Dispense: 60 tablet; Refill: 3  Refuses adult immunizations Labs pending Health maintenance reviewed Diet and exercise encouraged Continue all meds Follow up  In 1 month recheck HGBA!C   Mary-Margaret Hassell Done, FNP

## 2014-11-11 NOTE — Patient Instructions (Signed)

## 2014-11-12 LAB — THYROID PANEL WITH TSH
Free Thyroxine Index: 2 (ref 1.2–4.9)
T3 Uptake Ratio: 23 % — ABNORMAL LOW (ref 24–39)
T4 TOTAL: 8.7 ug/dL (ref 4.5–12.0)
TSH: 6.08 u[IU]/mL — ABNORMAL HIGH (ref 0.450–4.500)

## 2014-11-12 LAB — CMP14+EGFR
ALT: 29 IU/L (ref 0–32)
AST: 30 IU/L (ref 0–40)
Albumin/Globulin Ratio: 1.4 (ref 1.1–2.5)
Albumin: 4.2 g/dL (ref 3.5–4.7)
Alkaline Phosphatase: 75 IU/L (ref 39–117)
BILIRUBIN TOTAL: 0.4 mg/dL (ref 0.0–1.2)
BUN/Creatinine Ratio: 24 (ref 11–26)
BUN: 19 mg/dL (ref 8–27)
CALCIUM: 9.6 mg/dL (ref 8.7–10.3)
CO2: 26 mmol/L (ref 18–29)
Chloride: 96 mmol/L — ABNORMAL LOW (ref 97–108)
Creatinine, Ser: 0.79 mg/dL (ref 0.57–1.00)
GFR, EST AFRICAN AMERICAN: 82 mL/min/{1.73_m2} (ref >59)
GFR, EST NON AFRICAN AMERICAN: 71 mL/min/{1.73_m2} (ref >59)
GLOBULIN, TOTAL: 3.1 g/dL (ref 1.5–4.5)
Glucose: 252 mg/dL — ABNORMAL HIGH (ref 65–99)
Potassium: 4.6 mmol/L (ref 3.5–5.2)
Sodium: 138 mmol/L (ref 134–144)
TOTAL PROTEIN: 7.3 g/dL (ref 6.0–8.5)

## 2014-11-12 LAB — LIPID PANEL
CHOL/HDL RATIO: 4.9 ratio — AB (ref 0.0–4.4)
Cholesterol, Total: 235 mg/dL — ABNORMAL HIGH (ref 100–199)
HDL: 48 mg/dL (ref >39)
LDL Calculated: 137 mg/dL — ABNORMAL HIGH (ref 0–99)
Triglycerides: 250 mg/dL — ABNORMAL HIGH (ref 0–149)
VLDL Cholesterol Cal: 50 mg/dL — ABNORMAL HIGH (ref 5–40)

## 2014-11-14 ENCOUNTER — Other Ambulatory Visit: Payer: Self-pay | Admitting: Nurse Practitioner

## 2014-11-22 LAB — CUP PACEART REMOTE DEVICE CHECK: Date Time Interrogation Session: 20160829092821

## 2014-11-24 ENCOUNTER — Encounter: Payer: Self-pay | Admitting: *Deleted

## 2014-11-30 ENCOUNTER — Encounter: Payer: Self-pay | Admitting: Nurse Practitioner

## 2014-11-30 ENCOUNTER — Ambulatory Visit (INDEPENDENT_AMBULATORY_CARE_PROVIDER_SITE_OTHER): Payer: Federal, State, Local not specified - PPO | Admitting: Nurse Practitioner

## 2014-11-30 VITALS — BP 132/72 | HR 66 | Temp 97.3°F | Ht 60.0 in | Wt 168.0 lb

## 2014-11-30 DIAGNOSIS — B372 Candidiasis of skin and nail: Secondary | ICD-10-CM

## 2014-11-30 MED ORDER — GLUCOSE BLOOD VI STRP: ORAL_STRIP | Status: DC

## 2014-11-30 MED ORDER — NYSTATIN 100000 UNIT/GM EX OINT: 1.0000 "application " | TOPICAL_OINTMENT | Freq: Two times a day (BID) | CUTANEOUS | Status: DC

## 2014-11-30 MED ORDER — ONETOUCH ULTRASOFT LANCETS MISC: Status: DC

## 2014-11-30 NOTE — Addendum Note (Signed)
Addended by: Chevis Pretty on: 11/30/2014 11:03 AM   Modules accepted: Orders

## 2014-11-30 NOTE — Progress Notes (Signed)
   Subjective:    Patient ID: Leah Levine, female    DOB: Apr 05, 1932, 81 y.o.   MRN: [id number]   HPI: Pt reports a rash under her breast and groin that started about a year ago. Reports she has been seen before for the same, but has not found anything that seems to work. Reports itching with rash, and has tried baby powder without much relief. Also states she has had a sore throat and swelling to the sides of her neck. No fevers at home.     Review of Systems  Constitutional: Negative.   HENT: Negative.   Eyes: Negative.   Respiratory: Negative.   Cardiovascular: Negative.   Skin: Positive for rash.       Objective:   Physical Exam  Constitutional: She appears well-developed and well-nourished.  HENT:  Head: Normocephalic.  Right Ear: External ear normal.  Left Ear: External ear normal.  Mouth/Throat: Oropharynx is clear and moist.  Eyes: Conjunctivae are normal. Pupils are equal, round, and reactive to light.  Neck: Normal range of motion.  Cardiovascular: Normal rate, regular rhythm, normal heart sounds and intact distal pulses.   Pulmonary/Chest: Effort normal and breath sounds normal.  Skin: Rash noted.  Under breasts, and in the groin folds    BP 132/72 mmHg  Pulse 66  Temp(Src) 97.3 F (36.3 C) (Oral)  Ht 5' (1.524 m)  Wt 168 lb (76.204 kg)  BMI 32.81 kg/m2       Assessment & Plan:   1. Cutaneous candidiasis    Allow air to get to areas Keep as dry as possible RTO prn  Mary-Margaret Hassell Done, FNP

## 2014-11-30 NOTE — Patient Instructions (Signed)
Cutaneous Candidiasis Cutaneous candidiasis is a condition in which there is an overgrowth of yeast (candida) on the skin. Yeast normally live on the skin, but in small enough numbers not to cause any symptoms. In certain cases, increased growth of the yeast may cause an actual yeast infection. This kind of infection usually occurs in areas of the skin that are constantly warm and moist, such as the armpits or the groin. Yeast is the most common cause of diaper rash in babies and in people who cannot control their bowel movements (incontinence). CAUSES  The fungus that most often causes cutaneous candidiasis is Candida albicans. Conditions that can increase the risk of getting a yeast infection of the skin include:  Obesity.  Pregnancy.  Diabetes.  Taking antibiotic medicine.  Taking birth control pills.  Taking steroid medicines.  Thyroid disease.  An iron or zinc deficiency.  Problems with the immune system. SYMPTOMS   Red, swollen area of the skin.  Bumps on the skin.  Itchiness. DIAGNOSIS  The diagnosis of cutaneous candidiasis is usually based on its appearance. Light scrapings of the skin may also be taken and viewed under a microscope to identify the presence of yeast. TREATMENT  Antifungal creams may be applied to the infected skin. In severe cases, oral medicines may be needed.  HOME CARE INSTRUCTIONS   Keep your skin clean and dry.  Maintain a healthy weight.  If you have diabetes, keep your blood sugar under control. SEEK IMMEDIATE MEDICAL CARE IF:  Your rash continues to spread despite treatment.  You have a fever, chills, or abdominal pain. Document Released: 11/28/2010 Document Revised: 06/04/2011 Document Reviewed: 11/28/2010 Exit[employer] Patient Information 2015 [employer]. This information is not intended to replace advice given to you by your health care provider. Make sure you discuss any questions you have with your health care provider.  

## 2014-12-01 ENCOUNTER — Encounter: Payer: Self-pay | Admitting: Internal Medicine

## 2014-12-06 ENCOUNTER — Ambulatory Visit (INDEPENDENT_AMBULATORY_CARE_PROVIDER_SITE_OTHER): Payer: Federal, State, Local not specified - PPO | Admitting: *Deleted

## 2014-12-06 DIAGNOSIS — R55 Syncope and collapse: Secondary | ICD-10-CM

## 2014-12-06 NOTE — Progress Notes (Signed)
Loop recorder 

## 2014-12-15 LAB — CUP PACEART REMOTE DEVICE CHECK: Date Time Interrogation Session: 20160911233719

## 2014-12-15 NOTE — Progress Notes (Signed)
Carelink summary report received. Battery status OK. Normal device function. No new symptom episodes, tachy episodes, brady, or pause episodes. No new AF episodes. Monthly summary reports and ROV with JA in 01/2015.

## 2014-12-28 ENCOUNTER — Other Ambulatory Visit: Payer: Self-pay

## 2014-12-28 DIAGNOSIS — K2100 Gastro-esophageal reflux disease with esophagitis, without bleeding: Secondary | ICD-10-CM

## 2014-12-28 MED ORDER — OMEPRAZOLE 20 MG PO CPDR: 20.0000 mg | DELAYED_RELEASE_CAPSULE | Freq: Every day | ORAL | Status: DC

## 2014-12-28 MED ORDER — GLIPIZIDE 5 MG PO TABS: 5.0000 mg | ORAL_TABLET | Freq: Two times a day (BID) | ORAL | Status: DC

## 2014-12-30 ENCOUNTER — Encounter: Payer: Self-pay | Admitting: Internal Medicine

## 2015-01-04 ENCOUNTER — Encounter: Payer: Self-pay | Admitting: Internal Medicine

## 2015-01-04 ENCOUNTER — Ambulatory Visit (INDEPENDENT_AMBULATORY_CARE_PROVIDER_SITE_OTHER): Payer: Federal, State, Local not specified - PPO | Admitting: *Deleted

## 2015-01-04 DIAGNOSIS — R55 Syncope and collapse: Secondary | ICD-10-CM | POA: Diagnosis not present

## 2015-01-05 NOTE — Progress Notes (Signed)
Loop recorder 

## 2015-01-14 ENCOUNTER — Telehealth: Payer: Self-pay | Admitting: Nurse Practitioner

## 2015-01-14 DIAGNOSIS — M25559 Pain in unspecified hip: Secondary | ICD-10-CM

## 2015-01-17 NOTE — Telephone Encounter (Signed)
Referral made to ortho

## 2015-01-17 NOTE — Telephone Encounter (Signed)
Left detailed message that ortho referral made and that it can take 7-10 business days and we will CB with all appt information, pt to CB if any further questions or concerns.

## 2015-02-02 ENCOUNTER — Telehealth: Payer: Self-pay | Admitting: *Deleted

## 2015-02-02 ENCOUNTER — Encounter: Payer: Self-pay | Admitting: Pharmacist

## 2015-02-02 ENCOUNTER — Telehealth: Payer: Self-pay | Admitting: Nurse Practitioner

## 2015-02-02 ENCOUNTER — Ambulatory Visit (INDEPENDENT_AMBULATORY_CARE_PROVIDER_SITE_OTHER): Payer: Federal, State, Local not specified - PPO | Admitting: Pharmacist

## 2015-02-02 VITALS — Ht 60.0 in | Wt 166.0 lb

## 2015-02-02 DIAGNOSIS — R7309 Other abnormal glucose: Secondary | ICD-10-CM

## 2015-02-02 DIAGNOSIS — E1165 Type 2 diabetes mellitus with hyperglycemia: Secondary | ICD-10-CM

## 2015-02-02 DIAGNOSIS — IMO0001 Reserved for inherently not codable concepts without codable children: Secondary | ICD-10-CM

## 2015-02-02 LAB — GLUCOSE, POCT (MANUAL RESULT ENTRY): POC GLUCOSE: 373 mg/dL — AB (ref 70–99)

## 2015-02-02 MED ORDER — NON FORMULARY
10.0000 [IU] | Freq: Once | Status: AC
  Administered 2015-02-02: 10 [IU] via SUBCUTANEOUS

## 2015-02-02 MED ORDER — SITAGLIPTIN PHOS-METFORMIN HCL 50-1000 MG PO TABS: 1.0000 | ORAL_TABLET | Freq: Two times a day (BID) | ORAL | Status: DC

## 2015-02-02 NOTE — Telephone Encounter (Signed)
I called patient.  She states that she has been taking her medications - metformin 500mg  qd; Janumet 50/500mg  bid and glipizide 5mg  bid.  Daughter was not there for me to discuss.  Asked patient is she could come in today - cannot come until 4pm- She will come to have BG checked and check for ketones in urine.

## 2015-02-02 NOTE — Telephone Encounter (Signed)
Pt's daughter calling stating that her BS is over 400 and she only had 2 boiled eggs at 8:30 this morning with water. Pt is only taking Metformin, not the glipizide or janumet that is on her med list. Please advise.

## 2015-02-02 NOTE — Progress Notes (Signed)
  Subjective:    Leah Levine is a 81 y.o. female who presents for uncontrolled Type 2 diabetes mellitus.  Her daughter called in earlier today with concerns that patient's BG was 400 and that patient was not taking her medicaitons for DM.  Leah Levine admits to eating icecream and other sweet within the last 24 hours.  She also ran out of janumet yeasterday.   BG was 373 in office today.  Current symptoms/problems include hyperglycemia and have been worsening.   Known diabetic complications: cardiovascular disease Cardiovascular risk factors: advanced age (older than 58 for men, 28 for women), diabetes mellitus, dyslipidemia and obesity (BMI >= 30 kg/m2) Current diabetic medications include glipizide 5mg  bid; janumet 50/500mg  bid; metformin 500mg  qd.  Eye exam current (within one year): unknown  Current monitoring regimen: patient does not check her own BG but her daughter checks for her daily. Home blood sugar records: patient reports BG in the 200 to 400's Any episodes of hypoglycemia? no   Objective:    Ht 5' (1.524 m)  Wt 166 lb (75.297 kg)  BMI 32.42 kg/m2   RBG = 373 in office today Tried to get urine dip - but patient states she cannot void enough to leave sample  Lab Review GLUCOSE (mg/dL)  Date Value  11/11/2014 252*  10/29/2014 384*  04/23/2014 169*   GLUCOSE, BLD (mg/dL)  Date Value  12/11/2013 164*  12/10/2013 197*  12/09/2013 345*   CO2 (mmol/L)  Date Value  11/11/2014 26  10/29/2014 26  04/23/2014 24   BUN (mg/dL)  Date Value  11/11/2014 19  10/29/2014 13  04/23/2014 14  12/11/2013 9  12/10/2013 14  12/09/2013 17   CREAT (mg/dL)  Date Value  09/03/2012 0.88   CREATININE, SER (mg/dL)  Date Value  11/11/2014 0.79  10/29/2014 0.82  04/23/2014 0.92   Assessment:    Diabetes Mellitus type II, under good control.    Plan:    1.  Rx changes: patient was given humalog insulin 10 units SQ in office today.  I would like to start  long acting insulin but patient refuses insulin.  Increase her Janumet to 50/1000mg  take 1 tablet bid.  2.  Education: Reviewed 'ABCs' of diabetes management (respective goals in parentheses):  A1C (<7), blood pressure (<130/80), and cholesterol (LDL <100). 3.  Briefly reviewed high CHO foods however patient was in a hurry and need to take her neighbor back home.   4.  Appt made for next week to review BG reading from home and to provide further educations  Cherre Robins, PharmD, CPP, CDE

## 2015-02-02 NOTE — Patient Instructions (Signed)
Diabetes and Standards of Medical Care   Diabetes is complicated. You may find that your diabetes team includes a dietitian, nurse, diabetes educator, eye doctor, and more. To help everyone know what is going on and to help you get the care you deserve, the following schedule of care was developed to help keep you on track. Below are the tests, exams, vaccines, medicines, education, and plans you will need.  Blood Glucose Goals Prior to meals = 80 - 130 Within 2 hours of the start of a meal = less than 180  HbA1c test (goal is less than 7.0% - your last value was %) This test shows how well you have controlled your glucose over the past 2 to 3 months. It is used to see if your diabetes management plan needs to be adjusted.   It is performed at least 2 times a year if you are meeting treatment goals.  It is performed 4 times a year if therapy has changed or if you are not meeting treatment goals.  Blood pressure test  This test is performed at every routine medical visit. The goal is less than 140/90 mmHg for most people, but 130/80 mmHg in some cases. Ask your health care provider about your goal.  Dental exam  Follow up with the dentist regularly.  Eye exam  If you are diagnosed with type 1 diabetes as a child, get an exam upon reaching the age of 10 years or older and have had diabetes for 3 to 5 years. Yearly eye exams are recommended after that initial eye exam.  If you are diagnosed with type 1 diabetes as an adult, get an exam within 5 years of diagnosis and then yearly.  If you are diagnosed with type 2 diabetes, get an exam as soon as possible after the diagnosis and then yearly.  Foot care exam  Visual foot exams are performed at every routine medical visit. The exams check for cuts, injuries, or other problems with the feet.  A comprehensive foot exam should be done yearly. This includes visual inspection as well as assessing foot pulses and testing for loss of  sensation.  Check your feet nightly for cuts, injuries, or other problems with your feet. Tell your health care provider if anything is not healing.  Kidney function test (urine microalbumin)  This test is performed once a year.  Type 1 diabetes: The first test is performed 5 years after diagnosis.  Type 2 diabetes: The first test is performed at the time of diagnosis.  A serum creatinine and estimated glomerular filtration rate (eGFR) test is done once a year to assess the level of chronic kidney disease (CKD), if present.  Lipid profile (cholesterol, HDL, LDL, triglycerides)  Performed every 5 years for most people.  The goal for LDL is less than 100 mg/dL. If you are at high risk, the goal is less than 70 mg/dL.  The goal for HDL is 40 mg/dL to 50 mg/dL for men and 50 mg/dL to 60 mg/dL for women. An HDL cholesterol of 60 mg/dL or higher gives some protection against heart disease.  The goal for triglycerides is less than 150 mg/dL.  Influenza vaccine, pneumococcal vaccine, and hepatitis B vaccine  The influenza vaccine is recommended yearly.  The pneumococcal vaccine is generally given once in a lifetime. However, there are some instances when another vaccination is recommended. Check with your health care provider.  The hepatitis B vaccine is also recommended for adults with diabetes.    Diabetes self-management education  Education is recommended at diagnosis and ongoing as needed.  Treatment plan  Your treatment plan is reviewed at every medical visit.  Document Released: 01/07/2009 Document Revised: 11/12/2012 Document Reviewed: 08/12/2012 [employer] Patient Information 2014 [employer]   

## 2015-02-03 ENCOUNTER — Telehealth: Payer: Self-pay | Admitting: Pharmacist

## 2015-02-03 ENCOUNTER — Ambulatory Visit (INDEPENDENT_AMBULATORY_CARE_PROVIDER_SITE_OTHER): Payer: Federal, State, Local not specified - PPO | Admitting: *Deleted

## 2015-02-03 ENCOUNTER — Other Ambulatory Visit: Payer: Federal, State, Local not specified - PPO

## 2015-02-03 DIAGNOSIS — R55 Syncope and collapse: Secondary | ICD-10-CM

## 2015-02-03 LAB — POCT UA - MICROSCOPIC ONLY
CASTS, UR, LPF, POC: NEGATIVE
CRYSTALS, UR, HPF, POC: NEGATIVE
Yeast, UA: NEGATIVE

## 2015-02-03 LAB — POCT URINALYSIS DIPSTICK
BILIRUBIN UA: NEGATIVE
KETONES UA: NEGATIVE
Nitrite, UA: POSITIVE
PH UA: 5
Protein, UA: NEGATIVE
Spec Grav, UA: 1.02
Urobilinogen, UA: NEGATIVE

## 2015-02-03 NOTE — Progress Notes (Signed)
Labs for 02/02/2015 urine only

## 2015-02-03 NOTE — Addendum Note (Signed)
Addended by: Earlene Plater on: 02/03/2015 01:43 PM   Modules accepted: Orders

## 2015-02-03 NOTE — Telephone Encounter (Signed)
Patient states that she has money taken from her bank account and she would not be able to pay for Janumet until next Wednesday.  I gave her #7 samples for Januvia 100mg  to take 1 tablet daily.  She is to use metformin 500mg  she has at home and take 2 tablet = 1000mg  twice a day. Follow up in 1 week.  I also advised patient of results of urinanalysis.  She denies any symptoms of UTI.  Culture pending - will treat based on results.

## 2015-02-03 NOTE — Telephone Encounter (Signed)
See cal from today regarding BG this am

## 2015-02-04 NOTE — Progress Notes (Signed)
Carelink Summary Report / Loop recorder 

## 2015-02-05 LAB — URINE CULTURE

## 2015-02-10 ENCOUNTER — Ambulatory Visit: Payer: Self-pay | Admitting: Pharmacist

## 2015-02-23 ENCOUNTER — Encounter (INDEPENDENT_AMBULATORY_CARE_PROVIDER_SITE_OTHER): Payer: Federal, State, Local not specified - PPO | Admitting: Family Medicine

## 2015-02-23 ENCOUNTER — Encounter: Payer: Self-pay | Admitting: Family Medicine

## 2015-02-23 VITALS — Ht 60.0 in | Wt 176.2 lb

## 2015-02-23 DIAGNOSIS — R300 Dysuria: Secondary | ICD-10-CM | POA: Diagnosis not present

## 2015-02-23 LAB — POCT UA - MICROSCOPIC ONLY
Casts, Ur, LPF, POC: NEGATIVE
Crystals, Ur, HPF, POC: NEGATIVE
MUCUS UA: NEGATIVE
RBC, URINE, MICROSCOPIC: NEGATIVE
WBC, UR, HPF, POC: NEGATIVE
YEAST UA: NEGATIVE

## 2015-02-23 LAB — POCT URINALYSIS DIPSTICK
Blood, UA: NEGATIVE
NITRITE UA: POSITIVE
PROTEIN UA: NEGATIVE
SPEC GRAV UA: 1.02
UROBILINOGEN UA: NEGATIVE
pH, UA: 6

## 2015-02-23 NOTE — Progress Notes (Deleted)
Subjective:  Patient ID: Leah Levine, female    DOB: 14-Oct-1932  Age: 81 y.o. MRN: [id number]  CC: Urinary Tract Infection   HPI Leah Levine presents for ***  History Leah Levine has a past medical history of Tremor; GERD (gastroesophageal reflux disease); Hyperlipidemia; Allergy; Diabetes mellitus type 2, controlled (Leah Levine); Hypertension; Personal history of colonic polyps (11/20/2010); Hypothyroidism; Anemia; Skin cancer; and CAD (coronary artery disease) (11-2012).   She has past surgical history that includes Appendectomy; Cholecystectomy; Abdominal hysterectomy; Knee arthroscopy; Carpal tunnel release; Back surgery; Coronary artery bypass graft (N/A, 11/26/2012); Intraoprative transesophageal echocardiogram (N/A, 11/26/2012); left heart catheterization with coronary angiogram (N/A, 11/25/2012); loop recorder implant (N/A, 12/10/2013); and Neck surgery.   Her family history includes Diabetes in her father and sister; Heart attack in her brother and sister; Ovarian cancer in her mother; Pneumonia in her father; Stroke in her daughter.She reports that she has never smoked. She has never used smokeless tobacco. She reports that she does not drink alcohol or use illicit drugs.  Outpatient Prescriptions Prior to Visit  Medication Sig Dispense Refill  . aspirin EC 325 MG EC tablet Take 1 tablet (325 mg total) by mouth daily. 30 tablet 0  . glipiZIDE (GLUCOTROL) 5 MG tablet Take 1 tablet (5 mg total) by mouth 2 (two) times daily before a meal. 180 tablet 0  . glucose blood (ONE TOUCH ULTRA TEST) test strip Test one time a day and prn  Dx E11.9 100 each 12  . Lancets (ONETOUCH ULTRASOFT) lancets Test one time a day and prn  Dx e11.9 100 each 12  . levothyroxine (SYNTHROID, LEVOTHROID) 100 MCG tablet Take 1 tablet (100 mcg total) by mouth daily. 90 tablet 1  . meclizine (ANTIVERT) 25 MG tablet TAKE 1 TABLET (25 MG TOTAL) BY MOUTH 3 (THREE) TIMES DAILY AS NEEDED FOR DIZZINESS. 30 tablet 2    . omeprazole (PRILOSEC) 20 MG capsule Take 1 capsule (20 mg total) by mouth daily. 90 capsule 0  . propranolol ER (INDERAL LA) 80 MG 24 hr capsule Take 1 capsule (80 mg total) by mouth 2 (two) times daily. 60 capsule 5  . sitaGLIPtin-metformin (JANUMET) 50-1000 MG tablet Take 1 tablet by mouth 2 (two) times daily with a meal. 90 tablet 0  . traMADol (ULTRAM) 50 MG tablet Take 50 mg by mouth as needed.     . Trospium Chloride 60 MG CP24 TAKE 1 CAPSULE (60 MG TOTAL) BY MOUTH DAILY. 30 capsule 2   No facility-administered medications prior to visit.    ROS Review of Systems  Objective:  Ht 5' (1.524 m)  Wt 176 lb 3.2 oz (79.924 kg)  BMI 34.41 kg/m2  BP Readings from Last 3 Encounters:  11/30/14 132/72  11/11/14 131/77  11/02/14 146/70    Wt Readings from Last 3 Encounters:  02/23/15 176 lb 3.2 oz (79.924 kg)  02/02/15 166 lb (75.297 kg)  11/30/14 168 lb (76.204 kg)     Physical Exam  Lab Results  Component Value Date   HGBA1C 13.0 11/11/2014   HGBA1C 13.0* 10/29/2014   HGBA1C 9.2 04/23/2014    Lab Results  Component Value Date   WBC 7.1 10/29/2014   HGB 13.8 04/23/2014   HCT 42.6 10/29/2014   PLT 166 12/11/2013   GLUCOSE 252* 11/11/2014   CHOL 235* 11/11/2014   TRIG 250* 11/11/2014   HDL 48 11/11/2014   LDLCALC 137* 11/11/2014   ALT 29 11/11/2014   AST 30 11/11/2014   NA 138  11/11/2014   K 4.6 11/11/2014   CL 96* 11/11/2014   CREATININE 0.79 11/11/2014   BUN 19 11/11/2014   CO2 26 11/11/2014   TSH 6.080* 11/11/2014   INR 1.40 11/26/2012   HGBA1C 13.0 11/11/2014   Results for orders placed or performed in visit on 02/23/15  POCT urinalysis dipstick  Result Value Ref Range   Color, UA YELLOW    Clarity, UA CLOUDY    Glucose, UA TRACE    Bilirubin, UA SMALL    Ketones, UA TRACE    Spec Grav, UA 1.020    Blood, UA NEG    pH, UA 6.0    Protein, UA NEG    Urobilinogen, UA negative    Nitrite, UA POS    Leukocytes, UA Trace (A) Negative  POCT UA -  Microscopic Only  Result Value Ref Range   WBC, Ur, HPF, POC NEG    RBC, urine, microscopic NEG    Bacteria, U Microscopic MOD    Mucus, UA NEG    Epithelial cells, urine per micros FEW    Crystals, Ur, HPF, POC NEG    Casts, Ur, LPF, POC NEG    Yeast, UA NEG     Dg Epidurography  11/02/2014  CLINICAL DATA:  Right lower extremity radiculitis. Adjacent level disease. Displacement of the L3-4 lumbar disc. FLUOROSCOPY TIME:  185.52 uGy*m2 PROCEDURE: The procedure, risks, benefits, and alternatives were explained to the patient. Questions regarding the procedure were encouraged and answered. The patient understands and consents to the procedure. LUMBAR EPIDURAL INJECTION: An interlaminar approach was performed on right at L3-4. The overlying skin was cleansed and anesthetized. A 20 gauge Crawford epidural needle was advanced using loss-of-resistance technique. DIAGNOSTIC EPIDURAL INJECTION: Injection of Omnipaque 180 shows a good epidural pattern with spread above and below the level of needle placement, primarily on the right no vascular opacification is seen. THERAPEUTIC EPIDURAL INJECTION: 120 Mg of Depo-Medrol mixed with 3 mL 1% lidocaine were instilled. The procedure was well-tolerated, and the patient was discharged thirty minutes following the injection in good condition. COMPLICATIONS: None IMPRESSION: Technically successful epidural injection on the right L3-4 # 1 Electronically Signed   By: San Morelle M.D.   On: 11/02/2014 12:38    Assessment & Plan:   Leah Levine was seen today for urinary tract infection.  Diagnoses and all orders for this visit:  Dysuria -     POCT urinalysis dipstick -     POCT UA - Microscopic Only   I am having Leah Levine maintain her aspirin, meclizine, traMADol, propranolol ER, levothyroxine, Trospium Chloride, glucose blood, onetouch ultrasoft, omeprazole, glipiZIDE, and sitaGLIPtin-metformin.  No orders of the defined types were placed in this  encounter.     Follow-up: No Follow-up on file.  Leah Levine, M.D.

## 2015-02-25 LAB — URINE CULTURE

## 2015-02-27 NOTE — Progress Notes (Signed)
Erroneous

## 2015-03-04 LAB — CUP PACEART REMOTE DEVICE CHECK: Date Time Interrogation Session: 20161111003707

## 2015-03-04 NOTE — Progress Notes (Signed)
Carelink summary report received. Battery status OK. Normal device function. No new symptom episodes, tachy episodes, brady, or pause episodes. No new AF episodes. Monthly summary reports and ROV with JA on 04/06/15 at 11:15am.

## 2015-03-05 ENCOUNTER — Inpatient Hospital Stay (HOSPITAL_COMMUNITY): Payer: Medicare Other

## 2015-03-05 ENCOUNTER — Encounter (HOSPITAL_COMMUNITY): Payer: Self-pay | Admitting: Neurology

## 2015-03-05 ENCOUNTER — Emergency Department (HOSPITAL_COMMUNITY): Payer: Medicare Other

## 2015-03-05 ENCOUNTER — Inpatient Hospital Stay (HOSPITAL_COMMUNITY)
Admission: EM | Admit: 2015-03-05 | Discharge: 2015-03-09 | DRG: 065 | Disposition: A | Discharge status: Home Health | Payer: Medicare Other | Attending: Internal Medicine | Admitting: Internal Medicine

## 2015-03-05 DIAGNOSIS — D649 Anemia, unspecified: Secondary | ICD-10-CM | POA: Diagnosis present

## 2015-03-05 DIAGNOSIS — R251 Tremor, unspecified: Secondary | ICD-10-CM | POA: Diagnosis present

## 2015-03-05 DIAGNOSIS — I639 Cerebral infarction, unspecified: Secondary | ICD-10-CM | POA: Diagnosis present

## 2015-03-05 DIAGNOSIS — M542 Cervicalgia: Secondary | ICD-10-CM | POA: Diagnosis present

## 2015-03-05 DIAGNOSIS — N390 Urinary tract infection, site not specified: Secondary | ICD-10-CM | POA: Diagnosis present

## 2015-03-05 DIAGNOSIS — Z90710 Acquired absence of both cervix and uterus: Secondary | ICD-10-CM | POA: Diagnosis not present

## 2015-03-05 DIAGNOSIS — R4702 Dysphasia: Secondary | ICD-10-CM | POA: Diagnosis present

## 2015-03-05 DIAGNOSIS — E1165 Type 2 diabetes mellitus with hyperglycemia: Secondary | ICD-10-CM | POA: Diagnosis present

## 2015-03-05 DIAGNOSIS — Z79899 Other long term (current) drug therapy: Secondary | ICD-10-CM

## 2015-03-05 DIAGNOSIS — R4182 Altered mental status, unspecified: Secondary | ICD-10-CM | POA: Diagnosis present

## 2015-03-05 DIAGNOSIS — Z888 Allergy status to other drugs, medicaments and biological substances status: Secondary | ICD-10-CM

## 2015-03-05 DIAGNOSIS — E039 Hypothyroidism, unspecified: Secondary | ICD-10-CM | POA: Diagnosis present

## 2015-03-05 DIAGNOSIS — I2510 Atherosclerotic heart disease of native coronary artery without angina pectoris: Secondary | ICD-10-CM | POA: Diagnosis present

## 2015-03-05 DIAGNOSIS — R1319 Other dysphagia: Secondary | ICD-10-CM

## 2015-03-05 DIAGNOSIS — Z7984 Long term (current) use of oral hypoglycemic drugs: Secondary | ICD-10-CM

## 2015-03-05 DIAGNOSIS — I10 Essential (primary) hypertension: Secondary | ICD-10-CM | POA: Diagnosis present

## 2015-03-05 DIAGNOSIS — R269 Unspecified abnormalities of gait and mobility: Secondary | ICD-10-CM | POA: Diagnosis present

## 2015-03-05 DIAGNOSIS — R55 Syncope and collapse: Secondary | ICD-10-CM | POA: Diagnosis not present

## 2015-03-05 DIAGNOSIS — Z951 Presence of aortocoronary bypass graft: Secondary | ICD-10-CM

## 2015-03-05 DIAGNOSIS — E785 Hyperlipidemia, unspecified: Secondary | ICD-10-CM | POA: Diagnosis present

## 2015-03-05 DIAGNOSIS — E875 Hyperkalemia: Secondary | ICD-10-CM | POA: Diagnosis present

## 2015-03-05 DIAGNOSIS — R4701 Aphasia: Secondary | ICD-10-CM | POA: Diagnosis present

## 2015-03-05 DIAGNOSIS — I6300 Cerebral infarction due to thrombosis of unspecified precerebral artery: Secondary | ICD-10-CM

## 2015-03-05 DIAGNOSIS — I6789 Other cerebrovascular disease: Secondary | ICD-10-CM | POA: Diagnosis not present

## 2015-03-05 DIAGNOSIS — Z886 Allergy status to analgesic agent status: Secondary | ICD-10-CM

## 2015-03-05 DIAGNOSIS — E1100 Type 2 diabetes mellitus with hyperosmolarity without nonketotic hyperglycemic-hyperosmolar coma (NKHHC): Secondary | ICD-10-CM | POA: Diagnosis not present

## 2015-03-05 DIAGNOSIS — I63312 Cerebral infarction due to thrombosis of left middle cerebral artery: Secondary | ICD-10-CM | POA: Diagnosis not present

## 2015-03-05 DIAGNOSIS — Z86010 Personal history of colonic polyps: Secondary | ICD-10-CM

## 2015-03-05 DIAGNOSIS — E1129 Type 2 diabetes mellitus with other diabetic kidney complication: Secondary | ICD-10-CM | POA: Diagnosis present

## 2015-03-05 DIAGNOSIS — E538 Deficiency of other specified B group vitamins: Secondary | ICD-10-CM

## 2015-03-05 DIAGNOSIS — E871 Hypo-osmolality and hyponatremia: Secondary | ICD-10-CM | POA: Diagnosis present

## 2015-03-05 DIAGNOSIS — R1310 Dysphagia, unspecified: Secondary | ICD-10-CM

## 2015-03-05 DIAGNOSIS — Z7982 Long term (current) use of aspirin: Secondary | ICD-10-CM | POA: Diagnosis not present

## 2015-03-05 DIAGNOSIS — K219 Gastro-esophageal reflux disease without esophagitis: Secondary | ICD-10-CM | POA: Diagnosis present

## 2015-03-05 DIAGNOSIS — K2950 Unspecified chronic gastritis without bleeding: Secondary | ICD-10-CM

## 2015-03-05 LAB — COMPREHENSIVE METABOLIC PANEL
ALT: 37 U/L (ref 14–54)
AST: 81 U/L — AB (ref 15–41)
Albumin: 3.4 g/dL — ABNORMAL LOW (ref 3.5–5.0)
Alkaline Phosphatase: 58 U/L (ref 38–126)
Anion gap: 14 (ref 5–15)
BUN: 17 mg/dL (ref 6–20)
CHLORIDE: 99 mmol/L — AB (ref 101–111)
CO2: 18 mmol/L — AB (ref 22–32)
CREATININE: 0.97 mg/dL (ref 0.44–1.00)
Calcium: 8.5 mg/dL — ABNORMAL LOW (ref 8.9–10.3)
GFR calc Af Amer: >60 mL/min (ref >60)
GFR, EST NON AFRICAN AMERICAN: 54 mL/min — AB (ref >60)
Glucose, Bld: 338 mg/dL — ABNORMAL HIGH (ref 65–99)
Potassium: 6.8 mmol/L (ref 3.5–5.1)
SODIUM: 131 mmol/L — AB (ref 135–145)
Total Bilirubin: 1.1 mg/dL (ref 0.3–1.2)

## 2015-03-05 LAB — CBC
HEMATOCRIT: 42.5 % (ref 36.0–46.0)
Hemoglobin: 14.2 g/dL (ref 12.0–15.0)
MCH: 27.5 pg (ref 26.0–34.0)
MCHC: 33.4 g/dL (ref 30.0–36.0)
MCV: 82.4 fL (ref 78.0–100.0)
Platelets: 252 10*3/uL (ref 150–400)
RBC: 5.16 MIL/uL — AB (ref 3.87–5.11)
RDW: 15 % (ref 11.5–15.5)
WBC: 9.6 10*3/uL (ref 4.0–10.5)

## 2015-03-05 LAB — DIFFERENTIAL
BASOS ABS: 0 10*3/uL (ref 0.0–0.1)
BASOS PCT: 0 %
Eosinophils Absolute: 0.2 10*3/uL (ref 0.0–0.7)
Eosinophils Relative: 2 %
Lymphocytes Relative: 30 %
Lymphs Abs: 2.9 10*3/uL (ref 0.7–4.0)
MONOS PCT: 8 %
Monocytes Absolute: 0.7 10*3/uL (ref 0.1–1.0)
NEUTROS ABS: 5.8 10*3/uL (ref 1.7–7.7)
Neutrophils Relative %: 60 %

## 2015-03-05 LAB — I-STAT CHEM 8, ED
BUN: 29 mg/dL — AB (ref 6–20)
CHLORIDE: 100 mmol/L — AB (ref 101–111)
CREATININE: 0.8 mg/dL (ref 0.44–1.00)
Calcium, Ion: 1.01 mmol/L — ABNORMAL LOW (ref 1.13–1.30)
GLUCOSE: 345 mg/dL — AB (ref 65–99)
HCT: 45 % (ref 36.0–46.0)
Hemoglobin: 15.3 g/dL — ABNORMAL HIGH (ref 12.0–15.0)
POTASSIUM: 6.5 mmol/L — AB (ref 3.5–5.1)
SODIUM: 133 mmol/L — AB (ref 135–145)
TCO2: 27 mmol/L (ref 0–100)

## 2015-03-05 LAB — URINE MICROSCOPIC-ADD ON: RBC / HPF: NONE SEEN RBC/hpf (ref 0–5)

## 2015-03-05 LAB — RAPID URINE DRUG SCREEN, HOSP PERFORMED
AMPHETAMINES: NOT DETECTED
BARBITURATES: NOT DETECTED
BENZODIAZEPINES: NOT DETECTED
Cocaine: NOT DETECTED
Opiates: NOT DETECTED
Tetrahydrocannabinol: NOT DETECTED

## 2015-03-05 LAB — APTT: APTT: 28 s (ref 24–37)

## 2015-03-05 LAB — URINALYSIS, ROUTINE W REFLEX MICROSCOPIC
BILIRUBIN URINE: NEGATIVE
Glucose, UA: >1000 mg/dL — AB
HGB URINE DIPSTICK: NEGATIVE
Ketones, ur: NEGATIVE mg/dL
Leukocytes, UA: NEGATIVE
Nitrite: POSITIVE — AB
PROTEIN: NEGATIVE mg/dL
Specific Gravity, Urine: 1.028 (ref 1.005–1.030)
pH: 5.5 (ref 5.0–8.0)

## 2015-03-05 LAB — POTASSIUM: POTASSIUM: 4.6 mmol/L (ref 3.5–5.1)

## 2015-03-05 LAB — I-STAT TROPONIN, ED: Troponin i, poc: 0 ng/mL (ref 0.00–0.08)

## 2015-03-05 LAB — GLUCOSE, CAPILLARY: Glucose-Capillary: 244 mg/dL — ABNORMAL HIGH (ref 65–99)

## 2015-03-05 LAB — ETHANOL

## 2015-03-05 LAB — PROTIME-INR
INR: 1.07 (ref 0.00–1.49)
Prothrombin Time: 14.1 seconds (ref 11.6–15.2)

## 2015-03-05 LAB — CBG MONITORING, ED: GLUCOSE-CAPILLARY: 289 mg/dL — AB (ref 65–99)

## 2015-03-05 MED ORDER — PROPRANOLOL HCL ER 80 MG PO CP24
80.0000 mg | ORAL_CAPSULE | Freq: Two times a day (BID) | ORAL | Status: DC
  Administered 2015-03-05 – 2015-03-06 (×2): 80 mg via ORAL
  Filled 2015-03-05 (×3): qty 1

## 2015-03-05 MED ORDER — STROKE: EARLY STAGES OF RECOVERY BOOK
Freq: Once | Status: AC
  Administered 2015-03-05: 1
  Filled 2015-03-05: qty 1

## 2015-03-05 MED ORDER — PANTOPRAZOLE SODIUM 40 MG PO TBEC
40.0000 mg | DELAYED_RELEASE_TABLET | Freq: Every day | ORAL | Status: DC
Start: 2015-03-06 — End: 2015-03-09
  Administered 2015-03-06 – 2015-03-09 (×4): 40 mg via ORAL
  Filled 2015-03-05 (×4): qty 1

## 2015-03-05 MED ORDER — ASPIRIN 325 MG PO TABS
325.0000 mg | ORAL_TABLET | Freq: Every day | ORAL | Status: DC
  Administered 2015-03-05 – 2015-03-09 (×5): 325 mg via ORAL
  Filled 2015-03-05 (×5): qty 1

## 2015-03-05 MED ORDER — ASPIRIN 300 MG RE SUPP: 300.0000 mg | Freq: Every day | RECTAL | Status: DC

## 2015-03-05 MED ORDER — LEVOTHYROXINE SODIUM 100 MCG PO TABS
100.0000 ug | ORAL_TABLET | Freq: Every day | ORAL | Status: DC
  Administered 2015-03-06 – 2015-03-09 (×4): 100 ug via ORAL
  Filled 2015-03-05 (×4): qty 1

## 2015-03-05 MED ORDER — HEPARIN SODIUM (PORCINE) 5000 UNIT/ML IJ SOLN
5000.0000 [IU] | Freq: Three times a day (TID) | INTRAMUSCULAR | Status: DC
  Administered 2015-03-05 – 2015-03-09 (×11): 5000 [IU] via SUBCUTANEOUS
  Filled 2015-03-05 (×11): qty 1

## 2015-03-05 MED ORDER — INSULIN ASPART 100 UNIT/ML ~~LOC~~ SOLN
3.0000 [IU] | Freq: Three times a day (TID) | SUBCUTANEOUS | Status: DC
  Administered 2015-03-06 – 2015-03-07 (×6): 3 [IU] via SUBCUTANEOUS

## 2015-03-05 MED ORDER — INSULIN ASPART 100 UNIT/ML ~~LOC~~ SOLN
0.0000 [IU] | Freq: Every day | SUBCUTANEOUS | Status: DC
Start: 2015-03-05 — End: 2015-03-09
  Administered 2015-03-05 – 2015-03-06 (×2): 2 [IU] via SUBCUTANEOUS

## 2015-03-05 MED ORDER — ASPIRIN EC 325 MG PO TBEC: 325.0000 mg | DELAYED_RELEASE_TABLET | Freq: Every day | ORAL | Status: DC

## 2015-03-05 MED ORDER — SENNOSIDES-DOCUSATE SODIUM 8.6-50 MG PO TABS: 1.0000 | ORAL_TABLET | Freq: Every evening | ORAL | Status: DC | PRN

## 2015-03-05 MED ORDER — INSULIN ASPART 100 UNIT/ML ~~LOC~~ SOLN
0.0000 [IU] | Freq: Three times a day (TID) | SUBCUTANEOUS | Status: DC
  Administered 2015-03-06: 3 [IU] via SUBCUTANEOUS
  Administered 2015-03-06 – 2015-03-07 (×3): 5 [IU] via SUBCUTANEOUS
  Administered 2015-03-07: 3 [IU] via SUBCUTANEOUS
  Administered 2015-03-07: 5 [IU] via SUBCUTANEOUS
  Administered 2015-03-08 (×3): 3 [IU] via SUBCUTANEOUS
  Administered 2015-03-09: 5 [IU] via SUBCUTANEOUS
  Administered 2015-03-09: 2 [IU] via SUBCUTANEOUS

## 2015-03-05 MED ORDER — INSULIN DETEMIR 100 UNIT/ML ~~LOC~~ SOLN
10.0000 [IU] | Freq: Two times a day (BID) | SUBCUTANEOUS | Status: DC
  Administered 2015-03-05 – 2015-03-07 (×4): 10 [IU] via SUBCUTANEOUS
  Filled 2015-03-05 (×5): qty 0.1

## 2015-03-05 NOTE — H&P (Signed)
[facility] History and Physical     History and Physical:    Leah Levine   [id number] DOB: 05/28/1932 DOA: 03/05/2015  Referring MD/provider: Dr. Tomi Bamberger PCP: Chevis Pretty, FNP   Chief Complaint: Expressive aphasia  History of Present Illness:   Leah Levine is an 81 y.o. female past medical history coronary artery disease remote history of CABG diabetes mellitus with the last hemoglobin A1c of 13, Essential hypertension and hyperlipidemia comes in for expressive aphasia that started about 5 hours prior to admission. The patient relation she was on this morning was going out with her sisters to have lunch when she started mixing up her words, confusing pie for hamburger fork for knife. As discussed sporadically getting think much about it. But continued to have been so to Leah Levine ED.  In the ED: She is not on aspirin at home, CT of head acute bleed, basic metabolic panel showed a potassium of 6.5 EKG showed no peaked T waves call blood glucose was 289, UA showed no signs of infection to her consulted for further evaluation.   ROS:   ROS  Constitutional: No fever, no chills;  Appetite normal; No weight loss, no weight gain, no fatigue.   HEENT: No blurry vision, no diplopia, no pharyngitis, no dysphagia  CV: No chest pain, no palpitations, no PND, no orthopnea, no edema.   Resp: No SOB, no cough, no pleuritic pain.  GI: No nausea, no vomiting, no diarrhea, no melena, no hematochezia, no constipation, no abdominal pain.   GU: No dysuria, no hematuria, no frequency, no urgency.  MSK: No myalgias, no arthralgias.   Neuro:  No headache, no focal neurological deficits, no history of seizures.   Psych: No depression, no anxiety.   Endo: No heat intolerance, no cold intolerance, no polyuria, no polydipsia   Skin: No rashes, no skin lesions.   Heme: No easy bruising.   Travel history: No recent travel.   Past Medical History:   Past Medical  History  Diagnosis Date  . Tremor   . GERD (gastroesophageal reflux disease)   . Hyperlipidemia   . Allergy   . Diabetes mellitus type 2, controlled ([place])   . Hypertension     patient denies ever having hypertension  . Personal history of colonic polyps 11/20/2010    tubular adenomas  . Hypothyroidism   . Anemia   . Skin cancer     ear, right side of cheek  . CAD (coronary artery disease) 11-2012    CABG x 4 utilizing LIMA to LAD, SVG to Diagonal, SVG to Left Circumflex, and SVG to RCA    Past Surgical History:   Past Surgical History  Procedure Laterality Date  . Appendectomy    . Cholecystectomy    . Abdominal hysterectomy    . Knee arthroscopy      bilateral  . Carpal tunnel release    . Back surgery    . Coronary artery bypass graft N/A 11/26/2012    Procedure: CORONARY ARTERY BYPASS GRAFTING (CABG);  Surgeon: Grace Isaac, MD;  Location: [place];  Service: Open Heart Surgery;  Laterality: N/A;  . Intraoperative transesophageal echocardiogram N/A 11/26/2012    Procedure: INTRAOPERATIVE TRANSESOPHAGEAL ECHOCARDIOGRAM;  Surgeon: Grace Isaac, MD;  Location: [place];  Service: Open Heart Surgery;  Laterality: N/A;  . Left heart catheterization with coronary angiogram N/A 11/25/2012    Procedure: LEFT HEART CATHETERIZATION WITH CORONARY ANGIOGRAM;  Surgeon: Larey Dresser, MD;  Location: [facility] CATH  LAB;  Service: Cardiovascular;  Laterality: N/A;  . Loop recorder implant N/A 12/10/2013    Procedure: LOOP RECORDER IMPLANT;  Surgeon: Coralyn Mark, MD;  Location: [place] CATH LAB;  Service: Cardiovascular;  Laterality: N/A;  . Neck surgery      Social History:   Social History   Social History  . Marital Status: Widowed    Spouse Name: Shanon Brow  . Number of Children: 2  . Years of Education: 11   Occupational History  . Retired     Administrator, sports   Social History Main Topics  . Smoking status: Never Smoker   . Smokeless tobacco: Never Used  . Alcohol Use: No  . Drug Use:  No  . Sexual Activity: No   Other Topics Concern  . Not on file   Social History Narrative   Lives in [place] with her grandchildren.  She is widowed.  Her husband died in 07-09-2008.      Family history:   Family History  Problem Relation Age of Onset  . Ovarian cancer Mother   . Diabetes Father   . Pneumonia Father   . Diabetes Sister   . Stroke Daughter   . Heart attack Sister   . Heart attack Brother     Allergies   Celebrex; Fenofibrate; and Statins  Current Medications:   Prior to Admission medications   Medication Sig Start Date End Date Taking? Authorizing Provider  aspirin EC 325 MG EC tablet Take 1 tablet (325 mg total) by mouth daily. 12/11/13   Nita Sells, MD  glipiZIDE (GLUCOTROL) 5 MG tablet Take 1 tablet (5 mg total) by mouth 2 (two) times daily before a meal. 12/28/14   Mary-Margaret Hassell Done, FNP  glucose blood (ONE TOUCH ULTRA TEST) test strip Test one time a day and prn  Dx E11.9 11/30/14   Mary-Margaret Hassell Done, FNP  Lancets [facility] ULTRASOFT) lancets Test one time a day and prn  Dx e11.9 11/30/14   Mary-Margaret Hassell Done, FNP  levothyroxine (SYNTHROID, LEVOTHROID) 100 MCG tablet Take 1 tablet (100 mcg total) by mouth daily. 11/11/14   Mary-Margaret Hassell Done, FNP  meclizine (ANTIVERT) 25 MG tablet TAKE 1 TABLET (25 MG TOTAL) BY MOUTH 3 (THREE) TIMES DAILY AS NEEDED FOR DIZZINESS. 01/30/14   Chipper Herb, MD  omeprazole (PRILOSEC) 20 MG capsule Take 1 capsule (20 mg total) by mouth daily. 12/28/14   Mary-Margaret Hassell Done, FNP  propranolol ER (INDERAL LA) 80 MG 24 hr capsule Take 1 capsule (80 mg total) by mouth 2 (two) times daily. 11/11/14   Mary-Margaret Hassell Done, FNP  sitaGLIPtin-metformin (JANUMET) 50-1000 MG tablet Take 1 tablet by mouth 2 (two) times daily with a meal. 02/02/15   Tammy Eckard, PHARMD  traMADol (ULTRAM) 50 MG tablet Take 50 mg by mouth as needed.  10/26/14   Historical Provider, MD  Trospium Chloride 60 MG CP24 TAKE 1 CAPSULE (60 MG TOTAL) BY MOUTH  DAILY. 11/15/14   Mary-Margaret Hassell Done, FNP    Physical Exam:   Filed Vitals:   03/05/15 1650 03/05/15 1700 03/05/15 1715 03/05/15 1730  BP:  123/75 135/64 129/62  Pulse:  69 71 70  Temp: 98.6 F (37 C)     TempSrc:      Resp:  18 20 22   Weight:      SpO2:  97% 97% 96%     Physical Exam: Blood pressure 129/62, pulse 70, temperature 98.6 F (37 C), temperature source Oral, resp. rate 22, weight 80.7 kg (177 lb 14.6 oz), SpO2  96 %. Gen: No acute distress. Head: Normocephalic, atraumatic. Eyes: PERRL, EOMI, sclerae nonicteric. Mouth: Oropharynx Neck: Supple, no thyromegaly, no lymphadenopathy, no jugular venous distention. Chest: She has good air movement through to auscultation is a scar in her sternum CV: She is regular rate and rhythm with positive S1 and S2 to is appreciated. Abdomen: Soft, nontender, nondistended with normal active bowel sounds. Extremities: Extremities Skin: Warm and dry. Neuro: Alert and oriented times 3; cranial nerves II through XII grossly intact, except for facial droop. Muscle strength 5 over 5 in all 4 extremities, sensation is intact throughout her finger to nose is intact Psych: Mood and affect normal.   Data Review:    Labs: Basic Metabolic Panel:  Recent Labs Lab 03/05/15 1636  NA 133*  K 6.5*  CL 100*  GLUCOSE 345*  BUN 29*  CREATININE 0.80   Liver Function Tests: No results for input(s): AST, ALT, ALKPHOS, BILITOT, PROT, ALBUMIN in the last 168 hours. No results for input(s): LIPASE, AMYLASE in the last 168 hours. No results for input(s): AMMONIA in the last 168 hours. CBC:  Recent Labs Lab 03/05/15 1630 03/05/15 1636  WBC 9.6  --   NEUTROABS 5.8  --   HGB 14.2 15.3*  HCT 42.5 45.0  MCV 82.4  --   PLT 252  --    Cardiac Enzymes: No results for input(s): CKTOTAL, CKMB, CKMBINDEX, TROPONINI in the last 168 hours.  BNP (last 3 results) No results for input(s): PROBNP in the last 8760 hours. CBG:  Recent Labs Lab  03/05/15 1643  GLUCAP 289*    Radiographic Studies: Ct Head Wo Contrast  03/05/2015  CLINICAL DATA:  Slurred speech, confusion, code stroke EXAM: CT HEAD WITHOUT CONTRAST TECHNIQUE: Contiguous axial images were obtained from the base of the skull through the vertex without intravenous contrast. COMPARISON:  11/25/2012 FINDINGS: No skull fracture is noted. No intracranial hemorrhage, mass effect or midline shift. Paranasal sinuses and mastoid air cells are unremarkable. Mild atherosclerotic calcifications of carotid siphon. Mild cerebral atrophy. Mild periventricular white matter decreased attenuation probable due to chronic small vessel ischemic changes. No definite acute cortical infarction. No mass lesion is noted on this unenhanced scan. There is small area of decreased attenuation in left basal ganglia just posterior to caudate nucleus measures about 9 mm. This is probable due to ischemia of indeterminate age. This was not present from prior exam. IMPRESSION: No definite acute cortical infarction. Interval small area of decreased attenuation left basal ganglia just posterior to caudate nucleus measures 9 mm. Probable due to ischemia indeterminate age. Clinical correlation is necessary. Please see axial image 13. Mild cerebral atrophy. Mild periventricular white matter decreased attenuation probable due to chronic small vessel ischemic changes. These results were called by telephone at the time of interpretation on 03/05/2015 at 4:52 pm to Dr. Leonel Ramsay, who verbally acknowledged these results. Electronically Signed   By: Lahoma Crocker M.D.   On: 03/05/2015 16:52   *I have personally reviewed the images above*  EKG: Independently reviewed. Normal sinus rhythm normal axis, intervals are within parameters she has no peaked T waves.   Assessment/Plan:   Stroke (cerebrum) (HCC) HgbA1c, fasting lipid panel, she started a statin start Lipitor MRI, MRA of the brain without contrast  PT, OT, Speech  consult  Echocardiogram and Carotid dopplers  Prophylactic therapy-Antiplatelet med: Aspirin - dose 325 mg PO daily, although her medication list usual aspirin should relate she has not been taking her aspirin. Avoid D5 fluids as  may be harmfull risk factor modification  Cardiac Monitoring  Neurochecks q4h  Keep MAP 70  Hyperlipidemia Check FLP start statins.  Essential Hypertension Will allow permissive hypertension continue beta blocker. Hold antihypertensive  Medication.  Diabetes mellitus type 2, uncontrolled ([place]) Hold all oral hypoglycemic agents start long-acting insulin plus sliding scale insulin check hemoglobin A1c   DVT prophylaxis  Code Status: Full. Family Communication: sisters Disposition Plan: Home wh2-3 days  Time spent: 31 min  Charlynne Cousins [facility] Pager [phone]  If 7PM-7AM, please contact night-coverage [email or web address] Password [facility] 03/05/2015, 5:57 PM

## 2015-03-05 NOTE — ED Notes (Addendum)
Potassium 6.8, states is hemolyzed

## 2015-03-05 NOTE — ED Provider Notes (Signed)
CSN: [id number]     Arrival date & time 03/05/15  1615 History   First MD Initiated Contact with Patient 03/05/15 1622     Chief Complaint  Patient presents with  . Altered Mental Status   HPI Patient presents to the emergency room with concerns for possible stroke.  Patient comes into the emergency room with a family member. They have been together all day today. Family member states last time she was sure that everything was fine and was at about noon. She noticed around lunch time that she started to say a few words that were mixed up. It was very sporadic and she did not think much of it initially. As the day progressed she continued to have more and more episodes.  She has been unable to say certain words. She was not able to identify a Christmas tree. She has not had any trouble with extremity weakness.  Past Medical History  Diagnosis Date  . Tremor   . GERD (gastroesophageal reflux disease)   . Hyperlipidemia   . Allergy   . Diabetes mellitus type 2, controlled ([place])   . Hypertension     patient denies ever having hypertension  . Personal history of colonic polyps 11/20/2010    tubular adenomas  . Hypothyroidism   . Anemia   . Skin cancer     ear, right side of cheek  . CAD (coronary artery disease) 11-2012    CABG x 4 utilizing LIMA to LAD, SVG to Diagonal, SVG to Left Circumflex, and SVG to RCA   Past Surgical History  Procedure Laterality Date  . Appendectomy    . Cholecystectomy    . Abdominal hysterectomy    . Knee arthroscopy      bilateral  . Carpal tunnel release    . Back surgery    . Coronary artery bypass graft N/A 11/26/2012    Procedure: CORONARY ARTERY BYPASS GRAFTING (CABG);  Surgeon: Grace Isaac, MD;  Location: [place];  Service: Open Heart Surgery;  Laterality: N/A;  . Intraoperative transesophageal echocardiogram N/A 11/26/2012    Procedure: INTRAOPERATIVE TRANSESOPHAGEAL ECHOCARDIOGRAM;  Surgeon: Grace Isaac, MD;  Location: [place];  Service: Open  Heart Surgery;  Laterality: N/A;  . Left heart catheterization with coronary angiogram N/A 11/25/2012    Procedure: LEFT HEART CATHETERIZATION WITH CORONARY ANGIOGRAM;  Surgeon: Larey Dresser, MD;  Location: [facility] CATH LAB;  Service: Cardiovascular;  Laterality: N/A;  . Loop recorder implant N/A 12/10/2013    Procedure: LOOP RECORDER IMPLANT;  Surgeon: Coralyn Mark, MD;  Location: [place] CATH LAB;  Service: Cardiovascular;  Laterality: N/A;  . Neck surgery     Family History  Problem Relation Age of Onset  . Ovarian cancer Mother   . Diabetes Father   . Pneumonia Father   . Diabetes Sister   . Stroke Daughter   . Heart attack Sister   . Heart attack Brother    Social History  Substance Use Topics  . Smoking status: Never Smoker   . Smokeless tobacco: Never Used  . Alcohol Use: No   OB History    No data available     Review of Systems  All other systems reviewed and are negative.     Allergies  Celebrex; Fenofibrate; and Statins  Home Medications   Prior to Admission medications   Medication Sig Start Date End Date Taking? Authorizing Provider  aspirin EC 325 MG EC tablet Take 1 tablet (325 mg total) by mouth  daily. 12/11/13   Nita Sells, MD  glipiZIDE (GLUCOTROL) 5 MG tablet Take 1 tablet (5 mg total) by mouth 2 (two) times daily before a meal. 12/28/14   Mary-Margaret Hassell Done, FNP  glucose blood (ONE TOUCH ULTRA TEST) test strip Test one time a day and prn  Dx E11.9 11/30/14   Mary-Margaret Hassell Done, FNP  Lancets [facility] ULTRASOFT) lancets Test one time a day and prn  Dx e11.9 11/30/14   Mary-Margaret Hassell Done, FNP  levothyroxine (SYNTHROID, LEVOTHROID) 100 MCG tablet Take 1 tablet (100 mcg total) by mouth daily. 11/11/14   Mary-Margaret Hassell Done, FNP  meclizine (ANTIVERT) 25 MG tablet TAKE 1 TABLET (25 MG TOTAL) BY MOUTH 3 (THREE) TIMES DAILY AS NEEDED FOR DIZZINESS. 01/30/14   Chipper Herb, MD  omeprazole (PRILOSEC) 20 MG capsule Take 1 capsule (20 mg total) by mouth daily.  12/28/14   Mary-Margaret Hassell Done, FNP  propranolol ER (INDERAL LA) 80 MG 24 hr capsule Take 1 capsule (80 mg total) by mouth 2 (two) times daily. 11/11/14   Mary-Margaret Hassell Done, FNP  sitaGLIPtin-metformin (JANUMET) 50-1000 MG tablet Take 1 tablet by mouth 2 (two) times daily with a meal. 02/02/15   Tammy Eckard, PHARMD  traMADol (ULTRAM) 50 MG tablet Take 50 mg by mouth as needed.  10/26/14   Historical Provider, MD  Trospium Chloride 60 MG CP24 TAKE 1 CAPSULE (60 MG TOTAL) BY MOUTH DAILY. 11/15/14   Mary-Margaret Hassell Done, FNP   BP 139/67 mmHg  Pulse 72  Temp(Src) 98.6 F (37 C) (Oral)  Resp 17  Wt 80.7 kg  SpO2 95% Physical Exam  Constitutional: She appears well-developed and well-nourished. No distress.  HENT:  Head: Normocephalic and atraumatic.  Right Ear: External ear normal.  Left Ear: External ear normal.  Mouth/Throat: Oropharynx is clear and moist.  Eyes: Conjunctivae are normal. Right eye exhibits no discharge. Left eye exhibits no discharge. No scleral icterus.  Neck: Neck supple. No tracheal deviation present.  Cardiovascular: Normal rate, regular rhythm and intact distal pulses.   Pulmonary/Chest: Effort normal and breath sounds normal. No stridor. No respiratory distress. She has no wheezes. She has no rales.  Abdominal: Soft. Bowel sounds are normal. She exhibits no distension. There is no tenderness. There is no rebound and no guarding.  Musculoskeletal: She exhibits no edema or tenderness.  Neurological: She is alert. She has normal strength. No cranial nerve deficit (right  facial droop, extraocular movements intact, expressive aphasia) or sensory deficit. She exhibits normal muscle tone. She displays no seizure activity. Coordination normal.  No pronator drift bilateral upper extrem, able to hold both legs off bed for 5 seconds, sensation intact in all extremities, no visual field cuts, no left or right sided neglect, no nystagmus noted   Skin: Skin is warm and dry. No rash  noted.  Psychiatric: She has a normal mood and affect.  Nursing note and vitals reviewed.   ED Course  Procedures (including critical care time) Labs Review Labs Reviewed  CBC - Abnormal; Notable for the following:    RBC 5.16 (*)    All other components within normal limits  I-STAT CHEM 8, ED - Abnormal; Notable for the following:    Sodium 133 (*)    Potassium 6.5 (*)    Chloride 100 (*)    BUN 29 (*)    Glucose, Bld 345 (*)    Calcium, Ion 1.01 (*)    Hemoglobin 15.3 (*)    All other components within normal limits  CBG MONITORING, ED -  Abnormal; Notable for the following:    Glucose-Capillary 289 (*)    All other components within normal limits  PROTIME-INR  APTT  DIFFERENTIAL  ETHANOL  COMPREHENSIVE METABOLIC PANEL  URINE RAPID DRUG SCREEN, HOSP PERFORMED  URINALYSIS, ROUTINE W REFLEX MICROSCOPIC (NOT AT [facility])  I-STAT TROPOININ, ED    Imaging Review Ct Head Wo Contrast  03/05/2015  CLINICAL DATA:  Slurred speech, confusion, code stroke EXAM: CT HEAD WITHOUT CONTRAST TECHNIQUE: Contiguous axial images were obtained from the base of the skull through the vertex without intravenous contrast. COMPARISON:  11/25/2012 FINDINGS: No skull fracture is noted. No intracranial hemorrhage, mass effect or midline shift. Paranasal sinuses and mastoid air cells are unremarkable. Mild atherosclerotic calcifications of carotid siphon. Mild cerebral atrophy. Mild periventricular white matter decreased attenuation probable due to chronic small vessel ischemic changes. No definite acute cortical infarction. No mass lesion is noted on this unenhanced scan. There is small area of decreased attenuation in left basal ganglia just posterior to caudate nucleus measures about 9 mm. This is probable due to ischemia of indeterminate age. This was not present from prior exam. IMPRESSION: No definite acute cortical infarction. Interval small area of decreased attenuation left basal ganglia just posterior  to caudate nucleus measures 9 mm. Probable due to ischemia indeterminate age. Clinical correlation is necessary. Please see axial image 13. Mild cerebral atrophy. Mild periventricular white matter decreased attenuation probable due to chronic small vessel ischemic changes. These results were called by telephone at the time of interpretation on 03/05/2015 at 4:52 pm to Dr. Leonel Ramsay, who verbally acknowledged these results. Electronically Signed   By: Lahoma Crocker M.D.   On: 03/05/2015 16:52   I have personally reviewed and evaluated these images and lab results as part of my medical decision-making.   EKG Interpretation   Date/Time:  Saturday March 05 2015 16:26:58 EST Ventricular Rate:  71 PR Interval:  143 QRS Duration: 94 QT Interval:  425 QTC Calculation: 462 R Axis:   -23 Text Interpretation:  Sinus rhythm Inferior infarct, old Lateral leads are  also involved Baseline wander in lead(s) I III aVR aVL aVF V1 No  significant change since last tracing Confirmed by Asaf Elmquist  MD-J, Dustee Bottenfield  [id number]) on 03/05/2015 4:33:48 PM      MDM   Final diagnoses:  Aphasia   55  Sx are concerning for a stroke.  We are at 4.5 hours after the last known normal time.  Not a TPA candidate but code stroke activated.  Will proceed with stroke evaluation.  Pt seen by Dr Leonel Ramsay.  Pt is not a candidate for intervention.  Pt has also told him that she has been having pain in her neck the past week.  Will plan on medical admission.  MRI brain and C spine    Dorie Rank, MD 03/05/15 [id number]

## 2015-03-05 NOTE — ED Notes (Signed)
Family reports LSN at 12 noon today. While at lunch pt having inappropriate speech, mixing up her words, unable to identify objects. Dr. Tomi Bamberger at bedside assessing patient. Pt having slurred speech.

## 2015-03-05 NOTE — ED Notes (Signed)
ACTIVATED CODE STROKE ON PT @ 16:26 PM VIA CARELINK

## 2015-03-06 ENCOUNTER — Inpatient Hospital Stay (HOSPITAL_COMMUNITY): Payer: Medicare Other

## 2015-03-06 ENCOUNTER — Inpatient Hospital Stay (HOSPITAL_COMMUNITY): Payer: Federal, State, Local not specified - PPO

## 2015-03-06 DIAGNOSIS — I63312 Cerebral infarction due to thrombosis of left middle cerebral artery: Secondary | ICD-10-CM

## 2015-03-06 DIAGNOSIS — I639 Cerebral infarction, unspecified: Secondary | ICD-10-CM

## 2015-03-06 DIAGNOSIS — E1165 Type 2 diabetes mellitus with hyperglycemia: Secondary | ICD-10-CM

## 2015-03-06 DIAGNOSIS — E1129 Type 2 diabetes mellitus with other diabetic kidney complication: Secondary | ICD-10-CM

## 2015-03-06 LAB — GLUCOSE, CAPILLARY
Glucose-Capillary: 232 mg/dL — ABNORMAL HIGH (ref 65–99)
Glucose-Capillary: 235 mg/dL — ABNORMAL HIGH (ref 65–99)
Glucose-Capillary: 169 mg/dL — ABNORMAL HIGH (ref 65–99)
Glucose-Capillary: 212 mg/dL — ABNORMAL HIGH (ref 65–99)

## 2015-03-06 LAB — COMPREHENSIVE METABOLIC PANEL
ALBUMIN: 3 g/dL — AB (ref 3.5–5.0)
ALT: 27 U/L (ref 14–54)
AST: 40 U/L (ref 15–41)
Alkaline Phosphatase: 50 U/L (ref 38–126)
Anion gap: 7 (ref 5–15)
BUN: 16 mg/dL (ref 6–20)
CHLORIDE: 101 mmol/L (ref 101–111)
CO2: 25 mmol/L (ref 22–32)
CREATININE: 0.86 mg/dL (ref 0.44–1.00)
Calcium: 8.5 mg/dL — ABNORMAL LOW (ref 8.9–10.3)
GFR calc Af Amer: >60 mL/min (ref >60)
GLUCOSE: 277 mg/dL — AB (ref 65–99)
Potassium: 4.2 mmol/L (ref 3.5–5.1)
Sodium: 133 mmol/L — ABNORMAL LOW (ref 135–145)
Total Bilirubin: 0.8 mg/dL (ref 0.3–1.2)
Total Protein: 5.8 g/dL — ABNORMAL LOW (ref 6.5–8.1)

## 2015-03-06 LAB — LIPID PANEL
CHOL/HDL RATIO: 7 ratio
CHOLESTEROL: 204 mg/dL — AB (ref 0–200)
HDL: 29 mg/dL — ABNORMAL LOW (ref >40)
LDL Cholesterol: 97 mg/dL (ref 0–99)
TRIGLYCERIDES: 391 mg/dL — AB (ref <150)
VLDL: 78 mg/dL — AB (ref 0–40)

## 2015-03-06 MED ORDER — PROPRANOLOL HCL ER 80 MG PO CP24
80.0000 mg | ORAL_CAPSULE | Freq: Every day | ORAL | Status: DC
  Administered 2015-03-07 – 2015-03-09 (×3): 80 mg via ORAL
  Filled 2015-03-06 (×3): qty 1

## 2015-03-06 MED ORDER — ACETAMINOPHEN 325 MG PO TABS
650.0000 mg | ORAL_TABLET | Freq: Four times a day (QID) | ORAL | Status: DC | PRN
  Administered 2015-03-06 – 2015-03-07 (×2): 650 mg via ORAL
  Filled 2015-03-06 (×2): qty 2

## 2015-03-06 MED ORDER — ACETAMINOPHEN 500 MG PO TABS: 500.0000 mg | ORAL_TABLET | Freq: Four times a day (QID) | ORAL | Status: DC | PRN

## 2015-03-06 MED ORDER — ATORVASTATIN CALCIUM 10 MG PO TABS
20.0000 mg | ORAL_TABLET | Freq: Every day | ORAL | Status: DC
  Administered 2015-03-06 – 2015-03-08 (×3): 20 mg via ORAL
  Filled 2015-03-06 (×3): qty 2

## 2015-03-06 NOTE — Progress Notes (Signed)
STROKE TEAM PROGRESS NOTE   HISTORY Leah Levine is an 81 y.o. female who was in her normal state of health this am until around noon at which point she began having slurred speech and saying things that were not correct. She denies numbness or weakness. She was able to communicate fairly well, but when showed pctures, she has marked difficulty with naming of objects.    LKW: noone tpa given?: no, outside of IV tpa window   SUBJECTIVE (INTERVAL HISTORY) Her family is large in number and present at the bedside.  Overall she feels her condition is gradually improving. She has complaints of word finding difficulty   OBJECTIVE Temp:  [97.2 F (36.2 C)-98.6 F (37 C)] 97.9 F (36.6 C) (12/11 1026) Pulse Rate:  [56-91] 61 (12/11 1026) Cardiac Rhythm:  [-] Sinus bradycardia (12/11 0837) Resp:  [16-25] 20 (12/11 1026) BP: (102-146)/(57-118) 124/57 mmHg (12/11 1026) SpO2:  [93 %-100 %] 96 % (12/11 1026) Weight:  [80.7 kg (177 lb 14.6 oz)] 80.7 kg (177 lb 14.6 oz) (12/10 1638)  CBC:  Recent Labs Lab 03/05/15 1630 03/05/15 1636  WBC 9.6  --   NEUTROABS 5.8  --   HGB 14.2 15.3*  HCT 42.5 45.0  MCV 82.4  --   PLT 252  --     Basic Metabolic Panel:  Recent Labs Lab 03/05/15 1630 03/05/15 1636 03/05/15 1825 03/06/15 0936  NA 131* 133*  --  133*  K 6.8* 6.5* 4.6 4.2  CL 99* 100*  --  101  CO2 18*  --   --  25  GLUCOSE 338* 345*  --  277*  BUN 17 29*  --  16  CREATININE 0.97 0.80  --  0.86  CALCIUM 8.5*  --   --  8.5*    Lipid Panel:    Component Value Date/Time   CHOL 204* 03/06/2015 0235   CHOL 235* 11/11/2014 1125   CHOL 230* 09/03/2012 1456   TRIG 391* 03/06/2015 0235   TRIG 379* 06/18/2013 0911   TRIG 527* 09/03/2012 1456   HDL 29* 03/06/2015 0235   HDL 48 11/11/2014 1125   HDL 36* 06/18/2013 0911   HDL 35* 09/03/2012 1456   CHOLHDL 7.0 03/06/2015 0235   CHOLHDL 4.9* 11/11/2014 1125   VLDL 78* 03/06/2015 0235   LDLCALC 97 03/06/2015 0235    LDLCALC 137* 11/11/2014 1125   LDLCALC 58 06/18/2013 0911   LDLCALC 90 09/03/2012 1456   HgbA1c:  Lab Results  Component Value Date   HGBA1C 13.0 11/11/2014   Urine Drug Screen:    Component Value Date/Time   LABOPIA NONE DETECTED 03/05/2015 1848   COCAINSCRNUR NONE DETECTED 03/05/2015 1848   LABBENZ NONE DETECTED 03/05/2015 1848   AMPHETMU NONE DETECTED 03/05/2015 1848   THCU NONE DETECTED 03/05/2015 1848   LABBARB NONE DETECTED 03/05/2015 1848      IMAGING  Dg Chest 2 View 03/05/2015   Mild vascular congestion noted.  Lungs remain grossly clear.    Ct Head Wo Contrast 03/05/2015   No definite acute cortical infarction. Interval small area of decreased attenuation left basal ganglia just posterior to caudate nucleus measures 9 mm. Probable due to ischemia indeterminate age. Clinical correlation is necessary. Please see axial image 13. Mild cerebral atrophy. Mild periventricular white matter decreased attenuation probable due to chronic small vessel ischemic changes.    Mr Jodene Nam Head/brain Wo Cm 03/05/2015    MRI HEAD:  11 x 13 mm acute ischemia LEFT internal  capsule/basal ganglia corresponding to today's CT finding. Otherwise negative MRI of the brain for age.   MRA HEAD:  No acute large vessel occlusion. High-grade stenoses bilateral posterior cerebral arteries. Mild luminal irregularity of the anterior and middle cerebral arteries compatible with atherosclerosis. Mild bilateral supraclinoid internal carotid artery status stenosis corresponding to calcific atherosclerosis on today's head CT.    PHYSICAL EXAM Physical Exam General - Well nourished, well developed, in NAD   Cardiovascular - Regular rate and rhythm; murmur Pulmonary: CTA Abdomen: NT, ND, normal bowel sounds; obese Extremities: No C/C/E  Neurological Exam Mental Status: Normal Orientation:  Oriented to person, place and time Speech:  Naming difficulty; follows commands very well  Cranial Nerves:   PERRL; EOMI; visual fields full, face grossly symmetric, hearing grossly intact; shrug symmetric and tongue midline  Motor Exam:  Tone:  Within normal limits; Strength: 5/5 throughout left; 4/5 in right LE; 3/5 in right upper extrenmity  Sensory: Intact to light touch throughout  Coordination:  Intact finger to nose on left  Gait: Deferred   ASSESSMENT/PLAN Ms. Leah Levine is a 81 y.o. female with history of hyperlipidemia, history of syncope with loop implanted September 2015, diabetes mellitus, hypertension, hypothyroidism, coronary artery disease, and anemia presenting with speech difficulties. She did not receive IV t-PA due to late presentation.    Stroke:  Dominant infarct secondary to cerebrovascular disease  Resultant  Right sided weakness and aphasia  MRI  11 x 13 mm acute ischemia LEFT internal capsule/basal ganglia  MRA  High-grade stenoses bilateral posterior cerebral arteries.  Carotid Doppler  1-39% ICA plaquing. Vertebral artery flow is antegrade  2D Echo  pending  LDL - 97  HgbA1c pending  VTE prophylaxis - subcutaneous heparin  Diet Carb Modified Fluid consistency:: Thin; Room service appropriate?: Yes  aspirin 325 mg daily prior to admission, now on aspirin 325 mg daily  Patient counseled to be compliant with her antithrombotic medications  Ongoing aggressive stroke risk factor management  Therapy recommendations: Pending  Disposition:  Pending  Hypertension  Blood pressure runs mildly low at times. (Patient currently on propranolol ER 80 mg twice daily)  Permissive hypertension (OK if < 220/120) but gradually normalize in 5-7 days  Hyperlipidemia  Home meds:  No lipid lowering medications prior to admission  LDL 97, goal < 70   Now on Lipitor 20 mg daily   Statin allergy listed - myalgias, muscle aches, and leg pain   Diabetes  HgbA1c pending, goal < 7.0  Uncontrolled  Other Stroke Risk Factors  Advanced  age  Obesity, Body mass index is 34.75 kg/(m^2).   Family hx stroke (daughter)  Coronary artery disease   Other Active Problems  Mild vascular congestion on chest x-ray - monitor (no history of congestive heart failure )  [employer] loop implanted 2015  - may need cardiology to interrogate.  Consider decreasing propranolol dose  Urine culture pending  Hospital day # 1  Will continue to follow for stroke work-up.  Had a lengthy talk with patient and family about risk factors, specifically diabetes  To contact Stroke Continuity provider, please refer to [email or web address] After hours, contact General Neurology

## 2015-03-06 NOTE — Progress Notes (Signed)
VASCULAR LAB PRELIMINARY  PRELIMINARY  PRELIMINARY  PRELIMINARY  Carotid duplex completed.    Preliminary report:  1-39% ICA plaquing.  Vertebral artery flow is antegrade.   Severiano Utsey, RVT 03/06/2015, 12:23 PM

## 2015-03-06 NOTE — Evaluation (Signed)
Physical Therapy Evaluation Patient Details Name: Leah Levine MRN: [id number] DOB: [birth year] Today's Date: 03/06/2015   History of Present Illness  Leah Levine is an 81 y.o. female who was in her normal state of health this am until around noon at which point she began having slurred speech and saying things that were not correct. She denies numbness or weakness. She was able to communicate fairly well, but when showed pctures, she has marked difficulty with naming of objects. PMH: tremor, B TKA, B CTR, DM2, HTN, CAD, back surgery, c-spine fusion, facial reconstruction with one eyebrow permanently raised and eyelid left.  Clinical Impression  Pt admitted with above diagnosis. Pt currently with functional limitations due to the deficits listed below (see PT Problem List). Pt fatigues very quickly with activity, requires min/ mod A for safety with out of bed mobility. Is running into objects right side with ambulation as well as having difficulty reading and skipping more words on right side of page.  Pt will benefit from skilled PT to increase their independence and safety with mobility to allow discharge to the venue listed below.       Follow Up Recommendations Home health PT;Supervision/Assistance - 24 hour    Equipment Recommendations  None recommended by PT    Recommendations for Other Services OT consult     Precautions / Restrictions Precautions Precautions: Fall Restrictions Weight Bearing Restrictions: No      Mobility  Bed Mobility Overal bed mobility: Needs Assistance Bed Mobility: Supine to Sit     Supine to sit: Mod assist;HOB elevated     General bed mobility comments: pt unable to get to EOB herself, daughter reports that she occasionally needed help PTA. HOB elevated, vc's for pt to grasp rail to which she responded "no" but followed the command. Mod A for scooting hips to EOB  Transfers Overall transfer level: Needs  assistance Equipment used: Rolling walker (2 wheeled) Transfers: Sit to/from Stand Sit to Stand: Min assist         General transfer comment: min A for power up and steadying from bed and toilet as well as min A for safety to sit  Ambulation/Gait Ambulation/Gait assistance: Min assist Ambulation Distance (Feet): 100 Feet Assistive device: Rolling walker (2 wheeled);1 person hand held assist Gait Pattern/deviations: Step-through pattern;Decreased stride length;Trunk flexed;Drifts right/left Gait velocity: decreased Gait velocity interpretation: <1.8 ft/sec, indicative of risk for recurrent falls General Gait Details: pt drifted right and ran into objects on right side, even when cued to attend to that side. HHA directed gait but pt fatigued faster without RW and began reaching for wall rail with left hand.   Stairs            Wheelchair Mobility    Modified Rankin (Stroke Patients Only) Modified Rankin (Stroke Patients Only) Pre-Morbid Rankin Score: Moderate disability Modified Rankin: Moderately severe disability     Balance Overall balance assessment: Needs assistance Sitting-balance support: Feet supported;No upper extremity supported Sitting balance-Leahy Scale: Fair     Standing balance support: Single extremity supported Standing balance-Leahy Scale: Poor Standing balance comment: when standing for perineal care, required one hand on RW.                              Pertinent Vitals/Pain Pain Assessment: Faces Faces Pain Scale: Hurts a little bit Pain Location: headache Pain Descriptors / Indicators: Aching Pain Intervention(s): Premedicated before session    Home Living  Family/patient expects to be discharged to:: Private residence Living Arrangements: Other relatives Available Help at Discharge: Family;Available 24 hours/day Type of Home: House Home Access: Stairs to enter Entrance Stairs-Rails: Can reach both;Right;Left Entrance  Stairs-Number of Steps: 2 Home Layout: Multi-level;Able to live on main level with bedroom/bathroom Home Equipment: Gilford Rile - 2 wheels;Cane - single point Additional Comments: pt relays that she only uses cane but family reports that she uses RW as well. Pt not a reliable historian today, sometimes cannot find words and other times relays incorrect information per family. Nephew stays with her at night. She has been OK during the day but family nearby that can stay with her.     Prior Function Level of Independence: Needs assistance   Gait / Transfers Assistance Needed: needed occasional supervision  ADL's / Homemaking Assistance Needed: assist for meals        Hand Dominance        Extremity/Trunk Assessment   Upper Extremity Assessment: Defer to OT evaluation (weak grip bilaterally)           Lower Extremity Assessment: Generalized weakness;RLE deficits/detail;LLE deficits/detail RLE Deficits / Details: 3/5 throughout but right=left LLE Deficits / Details: same as RLE  Cervical / Trunk Assessment: Kyphotic  Communication   Communication: Receptive difficulties;Expressive difficulties  Cognition Arousal/Alertness: Awake/alert Behavior During Therapy: WFL for tasks assessed/performed Overall Cognitive Status: Impaired/Different from baseline Area of Impairment: Memory;Following commands;Safety/judgement;Awareness;Problem solving;Attention   Current Attention Level: Sustained Memory: Decreased short-term memory Following Commands: Follows one step commands inconsistently;Follows multi-step commands inconsistently Safety/Judgement: Decreased awareness of deficits;Decreased awareness of safety Awareness: Emergent Problem Solving: Difficulty sequencing;Requires verbal cues General Comments: pt with nonsensical speech, word finding difficulties, occasional clear speech    General Comments General comments (skin integrity, edema, etc.): pt fatigues very quickly. Had pt try to  read at end of session, was missing some words, especially on right side of page or reading different word which did not make sense. Could not then comprehend what she had read.     Exercises General Exercises - Lower Extremity Ankle Circles/Pumps: AROM;Both;20 reps;Seated      Assessment/Plan    PT Assessment Patient needs continued PT services  PT Diagnosis Difficulty walking;Abnormality of gait;Generalized weakness;Altered mental status;Acute pain   PT Problem List Decreased strength;Decreased activity tolerance;Decreased balance;Decreased mobility;Decreased cognition;Decreased knowledge of use of DME;Decreased safety awareness;Decreased knowledge of precautions;Pain  PT Treatment Interventions DME instruction;Gait training;Stair training;Functional mobility training;Therapeutic activities;Therapeutic exercise;Balance training;Neuromuscular re-education;Cognitive remediation;Patient/family education   PT Goals (Current goals can be found in the Care Plan section) Acute Rehab PT Goals Patient Stated Goal: return home PT Goal Formulation: With patient Time For Goal Achievement: 03/20/15 Potential to Achieve Goals: Good    Frequency Min 4X/week   Barriers to discharge        Co-evaluation               End of Session Equipment Utilized During Treatment: Gait belt Activity Tolerance: Patient tolerated treatment well Patient left: in chair;with call bell/phone within reach;with family/visitor present Nurse Communication: Mobility status         Time: [id number] PT Time Calculation (min) (ACUTE ONLY): 26 min   Charges:   PT Evaluation $Initial PT Evaluation Tier I: 1 Procedure PT Treatments $Gait Training: 8-22 mins   PT G Codes:      Leighton Roach, PT  Acute Rehab Services  [place], [place] 03/06/2015, 3:04 PM

## 2015-03-06 NOTE — Progress Notes (Signed)
PROGRESS NOTE  Leah Levine ZOX:[id number] DOB: 1932-04-08 DOA: 03/05/2015 PCP: Bennie Pierini, FNP  Brief History 81 year old female with history of coronary artery disease with history of CABG, diabetes mellitus, hypertension, hyperlipidemia presented with dysphasia that started on the morning of admission.  Currently, the patient was out to lunch with her sisters when she had word finding difficulties. In addition, the patient has had some lower extremity weakness which has been chronic. Patient denied any fevers, chills, chest discomfort, shortness breath, visual disturbance, focal extremity weakness. Evaluation in the emergency department revealed hyperkalemia with potassium 6.4, hyponatremia with sodium 131. CT of the brain revealed an area of decreased attenuation in left basal ganglia. MRI of the brain revealed acute ischemia left internal capsule.  Neurology was consulted  Assessment/Plan: Acute nonhemorrhagic stroke -03/05/2015 MR brain--acute ischemia left internal capsule/basal ganglia -MRA brain negative for large vessel occlusion -Hemoglobin A1c -LDL 97 -PT/OT -Carotid duplex -Echo -Continue aspirin Diabetes mellitus type 2 -11/11/2014 hemoglobin A1c 13.0 -Repeat hemoglobin A1c -Hold glipizide and Jaunmet -novolog sliding scale Hypertension -continue Inderal Hyperlipidemia -start statin Gait instability -PT/OT evaluation CAD with hx of CABG -continue ASA -continue BB -no anginal symptoms Hyperkalemia -improved Hyponatremia -improving Pyuria -add urine culture  Family Communication:   Family update at beside Disposition Plan:   Home when medically stable       Procedures/Studies: Dg Chest 2 View  03/05/2015  CLINICAL DATA:  Acute CVA.  Initial encounter. EXAM: CHEST  2 VIEW COMPARISON:  Chest radiograph performed 12/09/2013 FINDINGS: The lungs are well-aerated. Mild vascular congestion is noted. There is no evidence of focal  opacification, pleural effusion or pneumothorax. A calcified granuloma is again noted at the left midlung zone. The heart is borderline normal in size. The patient is status post median sternotomy, with evidence of prior CABG. A loop recorder is noted. No acute osseous abnormalities are seen. Anterior bridging osteophytes are noted along the thoracic spine. IMPRESSION: Mild vascular congestion noted.  Lungs remain grossly clear. Electronically Signed   By: Roanna Raider M.D.   On: 03/05/2015 21:17   Ct Head Wo Contrast  03/05/2015  CLINICAL DATA:  Slurred speech, confusion, code stroke EXAM: CT HEAD WITHOUT CONTRAST TECHNIQUE: Contiguous axial images were obtained from the base of the skull through the vertex without intravenous contrast. COMPARISON:  11/25/2012 FINDINGS: No skull fracture is noted. No intracranial hemorrhage, mass effect or midline shift. Paranasal sinuses and mastoid air cells are unremarkable. Mild atherosclerotic calcifications of carotid siphon. Mild cerebral atrophy. Mild periventricular white matter decreased attenuation probable due to chronic small vessel ischemic changes. No definite acute cortical infarction. No mass lesion is noted on this unenhanced scan. There is small area of decreased attenuation in left basal ganglia just posterior to caudate nucleus measures about 9 mm. This is probable due to ischemia of indeterminate age. This was not present from prior exam. IMPRESSION: No definite acute cortical infarction. Interval small area of decreased attenuation left basal ganglia just posterior to caudate nucleus measures 9 mm. Probable due to ischemia indeterminate age. Clinical correlation is necessary. Please see axial image 13. Mild cerebral atrophy. Mild periventricular white matter decreased attenuation probable due to chronic small vessel ischemic changes. These results were called by telephone at the time of interpretation on 03/05/2015 at 4:52 pm to Dr. Amada Jupiter, who  verbally acknowledged these results. Electronically Signed   By: Natasha Mead M.D.   On: 03/05/2015 16:52   Mr Brain  Wo Contrast  03/05/2015  CLINICAL DATA:  Expressive aphasia for 5 hours prior to admission, confusion. Assess acute stroke. History of hyperlipidemia, hypertension and diabetes. EXAM: MRI HEAD WITHOUT CONTRAST MRA HEAD WITHOUT CONTRAST TECHNIQUE: Multiplanar, multiecho pulse sequences of the brain and surrounding structures were obtained without intravenous contrast. Angiographic images of the head were obtained using MRA technique without contrast. COMPARISON:  CT head March 05, 2015 at 1635 hours and MRI head February 09, 2014 FINDINGS: MRI HEAD FINDINGS 11 x 13 mm focus of acute ischemia, LEFT internal capsule genu extending to the globus pallidus with corresponding low ADC values. No additional foci of acute ischemia. No susceptibility artifact to suggest hemorrhage. No midline shift, mass effect or mass lesions. Ventricles and sulci are normal for patient's age. Scattered subcentimeter supratentorial white matter FLAIR T2 hyperintensities are less than expected for age most compatible with chronic small vessel ischemic disease. Small inferior basal ganglia perivascular spaces. No abnormal extra-axial fluid collections. Status post bilateral ocular lens implants. Trace paranasal sinus mucosal thickening without air-fluid levels. The mastoid air cells are well aerated. No abnormal sellar expansion. No cerebellar tonsillar ectopia. No suspicious calvarial bone marrow signal. MRA HEAD FINDINGS Anterior circulation: Normal flow related enhancement of the included cervical, petrous, cavernous and supraclinoid internal carotid arteries very mild narrowing of the bilateral supraclinoid internal carotid arteries, corresponding to calcific atherosclerosis on today's head CT. Patent anterior communicating artery. Normal flow related enhancement of the anterior and middle cerebral arteries, including  distal segments. Tiny supernumerary anterior cerebral artery arises from the RIGHT A1-2 junction. Mild luminal irregularity of the mid to distal anterior and middle cerebral arteries most compatible with atherosclerosis. No large vessel occlusion, high-grade stenosis, aneurysm. Posterior circulation: LEFT vertebral artery is dominant. Basilar artery is patent, with normal flow related enhancement of the main branch vessels. Patent bilateral posterior cerebral arteries. High-grade stenosis origin RIGHT P1. Tandem high-grade stenosis bilateral P2 and P3 segments. No large vessel occlusion, abnormal luminal irregularity, aneurysm. IMPRESSION: MRI HEAD: 11 x 13 mm acute ischemia LEFT internal capsule/basal ganglia corresponding to today's CT finding. Otherwise negative MRI of the brain for age. MRA HEAD: No acute large vessel occlusion. High-grade stenoses bilateral posterior cerebral arteries. Mild luminal irregularity of the anterior and middle cerebral arteries compatible with atherosclerosis. Mild bilateral supraclinoid internal carotid artery status stenosis corresponding to calcific atherosclerosis on today's head CT. Electronically Signed   By: Awilda Metro M.D.   On: 03/05/2015 22:34   Mr Bunny Glenn Head/brain Wo Cm  03/05/2015  CLINICAL DATA:  Expressive aphasia for 5 hours prior to admission, confusion. Assess acute stroke. History of hyperlipidemia, hypertension and diabetes. EXAM: MRI HEAD WITHOUT CONTRAST MRA HEAD WITHOUT CONTRAST TECHNIQUE: Multiplanar, multiecho pulse sequences of the brain and surrounding structures were obtained without intravenous contrast. Angiographic images of the head were obtained using MRA technique without contrast. COMPARISON:  CT head March 05, 2015 at 1635 hours and MRI head February 09, 2014 FINDINGS: MRI HEAD FINDINGS 11 x 13 mm focus of acute ischemia, LEFT internal capsule genu extending to the globus pallidus with corresponding low ADC values. No additional foci of  acute ischemia. No susceptibility artifact to suggest hemorrhage. No midline shift, mass effect or mass lesions. Ventricles and sulci are normal for patient's age. Scattered subcentimeter supratentorial white matter FLAIR T2 hyperintensities are less than expected for age most compatible with chronic small vessel ischemic disease. Small inferior basal ganglia perivascular spaces. No abnormal extra-axial fluid collections. Status post bilateral ocular lens implants.  Trace paranasal sinus mucosal thickening without air-fluid levels. The mastoid air cells are well aerated. No abnormal sellar expansion. No cerebellar tonsillar ectopia. No suspicious calvarial bone marrow signal. MRA HEAD FINDINGS Anterior circulation: Normal flow related enhancement of the included cervical, petrous, cavernous and supraclinoid internal carotid arteries very mild narrowing of the bilateral supraclinoid internal carotid arteries, corresponding to calcific atherosclerosis on today's head CT. Patent anterior communicating artery. Normal flow related enhancement of the anterior and middle cerebral arteries, including distal segments. Tiny supernumerary anterior cerebral artery arises from the RIGHT A1-2 junction. Mild luminal irregularity of the mid to distal anterior and middle cerebral arteries most compatible with atherosclerosis. No large vessel occlusion, high-grade stenosis, aneurysm. Posterior circulation: LEFT vertebral artery is dominant. Basilar artery is patent, with normal flow related enhancement of the main branch vessels. Patent bilateral posterior cerebral arteries. High-grade stenosis origin RIGHT P1. Tandem high-grade stenosis bilateral P2 and P3 segments. No large vessel occlusion, abnormal luminal irregularity, aneurysm. IMPRESSION: MRI HEAD: 11 x 13 mm acute ischemia LEFT internal capsule/basal ganglia corresponding to today's CT finding. Otherwise negative MRI of the brain for age. MRA HEAD: No acute large vessel  occlusion. High-grade stenoses bilateral posterior cerebral arteries. Mild luminal irregularity of the anterior and middle cerebral arteries compatible with atherosclerosis. Mild bilateral supraclinoid internal carotid artery status stenosis corresponding to calcific atherosclerosis on today's head CT. Electronically Signed   By: Awilda Metro M.D.   On: 03/05/2015 22:34         Subjective: Patient denies fevers, chills, headache, chest pain, dyspnea, nausea, vomiting, diarrhea, abdominal pain, dysuria, hematuria.  Patient is still having some word finding difficulties but denies any visual disturbance, headache, focal extremity weakness.   Objective: Filed Vitals:   03/06/15 0549 03/06/15 0740 03/06/15 0930 03/06/15 1026  BP: 102/67 105/78 119/77 124/57  Pulse: 56 59 91 61  Temp: 97.7 F (36.5 C) 97.2 F (36.2 C)  97.9 F (36.6 C)  TempSrc: Oral Oral  Oral  Resp: 20 18  20   Height:      Weight:      SpO2:  95%  96%    Intake/Output Summary (Last 24 hours) at 03/06/15 1105 Last data filed at 03/06/15 0900  Gross per 24 hour  Intake    360 ml  Output      0 ml  Net    360 ml   Weight change:  Exam:   General:  Pt is alert, follows commands appropriately, not in acute distress  HEENT: No icterus, No thrush, No neck mass, [place]/AT  Cardiovascular: RRR, S1/S2, no rubs, no gallops  Respiratory: CTA bilaterally, no wheezing, no crackles, no rhonchi  Abdomen: Soft/+BS, non tender, non distended, no guarding  Extremities: No edema, No lymphangitis, No petechiae, No rashes, no synovitis; no clubbing  Data Reviewed: Basic Metabolic Panel:  Recent Labs Lab 03/05/15 1630 03/05/15 1636 03/05/15 1825 03/06/15 0936  NA 131* 133*  --  133*  K 6.8* 6.5* 4.6 4.2  CL 99* 100*  --  101  CO2 18*  --   --  25  GLUCOSE 338* 345*  --  277*  BUN 17 29*  --  16  CREATININE 0.97 0.80  --  0.86  CALCIUM 8.5*  --   --  8.5*   Liver Function Tests:  Recent Labs Lab  03/05/15 1630 03/06/15 0936  AST 81* 40  ALT 37 27  ALKPHOS 58 50  BILITOT 1.1 0.8  PROT RESULTS UNAVAILABLE DUE TO  INTERFERING SUBSTANCE 5.8*  ALBUMIN 3.4* 3.0*   No results for input(s): LIPASE, AMYLASE in the last 168 hours. No results for input(s): AMMONIA in the last 168 hours. CBC:  Recent Labs Lab 03/05/15 1630 03/05/15 1636  WBC 9.6  --   NEUTROABS 5.8  --   HGB 14.2 15.3*  HCT 42.5 45.0  MCV 82.4  --   PLT 252  --    Cardiac Enzymes: No results for input(s): CKTOTAL, CKMB, CKMBINDEX, TROPONINI in the last 168 hours. BNP: Invalid input(s): POCBNP CBG:  Recent Labs Lab 03/05/15 1643 03/05/15 2321 03/06/15 0638  GLUCAP 289* 244* 232*    No results found for this or any previous visit (from the past 240 hour(s)).   Scheduled Meds: . aspirin  300 mg Rectal Daily   Or  . aspirin  325 mg Oral Daily  . heparin  5,000 Units Subcutaneous 3 times per day  . insulin aspart  0-15 Units Subcutaneous TID WC  . insulin aspart  0-5 Units Subcutaneous QHS  . insulin aspart  3 Units Subcutaneous TID WC  . insulin detemir  10 Units Subcutaneous BID  . levothyroxine  100 mcg Oral QAC breakfast  . pantoprazole  40 mg Oral Daily  . propranolol ER  80 mg Oral BID   Continuous Infusions:    Leah Piontek, DO  [facility] Pager [phone]  If 7PM-7AM, please contact night-coverage [email or web address] Password [email or web address] 03/06/2015, 11:05 AM   LOS: 1 day

## 2015-03-06 NOTE — Consult Note (Signed)
Neurology Consultation Reason for Consult: Confusion Referring Physician: Tat, D  CC: Confusion  History is obtained from:PAtient  HPI: Leah Levine is a 81 y.o. female who was in her normal state of health this am until around noon at which point she began having slurred speech and saying things that were not correct. She denies numbness or weakness. She is able to communicate fairly well, but when showed pctures, she has marked difficulty with naming of objects.    LKW: noone tpa given?: no, outside of IV tpa window    ROS: A 14 point ROS was performed and is negative except as noted in the HPI.   Past Medical History  Diagnosis Date  . Tremor   . GERD (gastroesophageal reflux disease)   . Hyperlipidemia   . Allergy   . Diabetes mellitus type 2, controlled ([place])   . Hypertension     patient denies ever having hypertension  . Personal history of colonic polyps 11/20/2010    tubular adenomas  . Hypothyroidism   . Anemia   . Skin cancer     ear, right side of cheek  . CAD (coronary artery disease) 11-2012    CABG x 4 utilizing LIMA to LAD, SVG to Diagonal, SVG to Left Circumflex, and SVG to RCA     Family History  Problem Relation Age of Onset  . Ovarian cancer Mother   . Diabetes Father   . Pneumonia Father   . Diabetes Sister   . Stroke Daughter   . Heart attack Sister   . Heart attack Brother      Social History:  reports that she has never smoked. She has never used smokeless tobacco. She reports that she does not drink alcohol or use illicit drugs.   Exam: Current vital signs: BP 102/67 mmHg  Pulse 56  Temp(Src) 97.7 F (36.5 C) (Oral)  Resp 20  Ht 5' (1.524 m)  Wt 80.7 kg (177 lb 14.6 oz)  BMI 34.75 kg/m2  SpO2 97% Vital signs in last 24 hours: Temp:  [97.7 F (36.5 C)-98.6 F (37 C)] 97.7 F (36.5 C) (12/11 0549) Pulse Rate:  [56-72] 56 (12/11 0549) Resp:  [16-25] 20 (12/11 0549) BP: (102-146)/(61-118) 102/67 mmHg (12/11  0549) SpO2:  [93 %-100 %] 97 % (12/11 0240) Weight:  [80.7 kg (177 lb 14.6 oz)] 80.7 kg (177 lb 14.6 oz) (12/10 1638)   Physical Exam  Constitutional: Appears well-developed and well-nourished.  Psych: Affect appropriate to situation Eyes: No scleral injection HENT: No OP obstrucion Head: Normocephalic.  Cardiovascular: Normal rate and regular rhythm.  Respiratory: Effort normal  GI: Soft.  No distension. There is no tenderness.  Skin: WDI  Neuro: Mental Status: Patient is awake, alert, oriented to person,  Age. Difficulty with naming month, but able to tell me that christmas is coming.  Patient is able to give a clear and coherent history. No signs of  Neglect. Signifciant difficulty with naming.  Cranial Nerves: II: Visual Fields are full. Pupils are equal, round, and reactive to light.   III,IV, VI: EOMI without ptosis or diploplia.  V: Facial sensation is symmetric to temperature VII: Facial movement is symmetric.  VIII: hearing is intact to voice X: Uvula elevates symmetrically XI: Shoulder shrug is symmetric. XII: tongue is midline without atrophy or fasciculations.  Motor: Tone is normal. Bulk is normal. 5/5 strength was present in all four extremities.  Sensory: Sensation is symmetric to pin Cerebellar: No clear ataxia.    I  have reviewed labs in epic and the results pertinent to this consultation are: cmp - hyperkalemia  I have reviewed the images obtained:CT head - negative.   Impression: 81 yo F with new anomic aphasia. I suspect that she had a small stroke, but is out of the IV tpa window. She will need to be admitted for further workup.   Recommendations: 1. HgbA1c, fasting lipid panel 2. MRI, MRA  of the brain without contrast 3. Frequent neuro checks 4. Echocardiogram 5. Carotid dopplers 6. Prophylactic therapy-Antiplatelet med: Aspirin - dose 325mg  PO or 300mg  PR 7. Risk factor modification 8. Telemetry monitoring 9. Speech consult    Roland Rack, MD [facility] [phone]  If 7pm- 7am, please page neurology on call as listed in [facility].

## 2015-03-06 NOTE — Progress Notes (Signed)
Utilization review completed.  

## 2015-03-06 NOTE — Discharge Summary (Signed)
Physician Discharge Summary  Leah Levine GNF:[id number] DOB: 06/28/1932 DOA: 03/05/2015  PCP: Bennie Pierini, FNP  Admit date: 03/05/2015 Discharge date: 03/08/15 Recommendations for Outpatient Follow-up:  1. Pt will need to follow up with PCP in 2 weeks post discharge 2. Please obtain BMP   Discharge Diagnoses:  Acute nonhemorrhagic stroke -03/05/2015 MR brain--acute ischemia left internal capsule/basal ganglia -MRA brain negative for large vessel occlusion -Hemoglobin A1c--11.0 -LDL 97 -PT/OT--> home health PT and OT -home health ST -Carotid duplex--negative for hemodynamically significant lesions -Echo--EF 55-60%, grade 1 DD -Continue aspirin Diabetes mellitus type 2, uncontrolled -11/11/2014 hemoglobin A1c 13.0 -03/06/2015 hemoglobin A1c--11.0 -Hold glipizide and Jaunmet -After discussion with the patient and her daughter, pt agrees to take insulin at home -increase levemir to 14 units bid -novolog 4 units with meals -home with lantus 30 units at bedtime -novolog sliding scale during the hospitalization Hypertension -continue Inderal once daily Hyperlipidemia -start statin Gait instability -PT/OT evaluation--home health PT/OT CAD with hx of CABG -continue ASA -continue BB--decrease Inderal to 80 mg once daily -no anginal symptoms Hyperkalemia -improved Hyponatremia -improved Pyuria/UTI -add urine culture-->GNR -home with cefuroxime x 5 days  Discharge Condition: stable  Disposition: home Follow-up Information    [facility]   Why:  Home Health will contact you to set up an appointment.    Contact information:   [phone]    home  Diet:carb modified Wt Readings from Last 3 Encounters:  03/05/15 80.7 kg (177 lb 14.6 oz)  02/23/15 79.924 kg (176 lb 3.2 oz)  02/02/15 75.297 kg (166 lb)    History of present illness:  81 year old female with history of coronary artery disease with history  of CABG, diabetes mellitus, hypertension, hyperlipidemia presented with dysphasia that started on the morning of admission. Currently, the patient was out to lunch with her sisters when she had word finding difficulties. In addition, the patient has had some lower extremity weakness which has been chronic. Patient denied any fevers, chills, chest discomfort, shortness breath, visual disturbance, focal extremity weakness. Evaluation in the emergency department revealed hyperkalemia with potassium 6.4, hyponatremia with sodium 131. CT of the brain revealed an area of decreased attenuation in left basal ganglia. MRI of the brain revealed acute ischemia left internal capsule. Neurology was consulted.  Full stroke work up was done.  PT/OT recommended home health.  HH ST was also set up.  Consultants: neurology  Discharge Exam: Filed Vitals:   03/08/15 0610 03/08/15 0950  BP: 141/61 121/56  Pulse: 62 63  Temp: 97.3 F (36.3 C) 98.4 F (36.9 C)  Resp: 18 15   Filed Vitals:   03/07/15 2114 03/08/15 0133 03/08/15 0610 03/08/15 0950  BP: 115/54 134/71 141/61 121/56  Pulse: 60 71 62 63  Temp: 97.7 F (36.5 C) 98.6 F (37 C) 97.3 F (36.3 C) 98.4 F (36.9 C)  TempSrc: Oral Oral Oral Oral  Resp: 18 18 18 15   Height:      Weight:      SpO2: 96% 95% 97% 95%   General: A&O x 3, NAD, pleasant, cooperative Cardiovascular: RRR, no rub, no gallop, no S3 Respiratory: CTAB, no wheeze, no rhonchi Abdomen:soft, nontender, nondistended, positive bowel sounds Extremities: No edema, No lymphangitis, no petechiae  Discharge Instructions      Discharge Instructions    Ambulatory referral to Neurology    Complete by:  As directed   Refer to [facility]     Diet Carb Modified    Complete  by:  As directed      Increase activity slowly    Complete by:  As directed             Medication List    STOP taking these medications        ALEVE 220 MG tablet  Generic drug:  naproxen sodium      glipiZIDE 5 MG tablet  Commonly known as:  GLUCOTROL      TAKE these medications        aspirin 325 MG EC tablet  Take 1 tablet (325 mg total) by mouth daily.     atorvastatin 20 MG tablet  Commonly known as:  LIPITOR  Take 1 tablet (20 mg total) by mouth daily at 6 PM.     cefUROXime 250 MG tablet  Commonly known as:  CEFTIN  Take 1 tablet (250 mg total) by mouth 2 (two) times daily with a meal.     glucose blood test strip  Commonly known as:  ONE TOUCH ULTRA TEST  Test one time a day and prn  Dx E11.9     Insulin Glargine 100 UNIT/ML Solostar Pen  Commonly known as:  LANTUS SOLOSTAR  Inject 30 Units into the skin daily at 10 pm.     Insulin Pen Needle 32G X 4 MM Misc  Use with insulin pen to dispense insulin daily     insulin starter kit- pen needles Misc  1 kit by Other route once.     levothyroxine 100 MCG tablet  Commonly known as:  SYNTHROID, LEVOTHROID  Take 1 tablet (100 mcg total) by mouth daily.     meclizine 25 MG tablet  Commonly known as:  ANTIVERT  TAKE 1 TABLET (25 MG TOTAL) BY MOUTH 3 (THREE) TIMES DAILY AS NEEDED FOR DIZZINESS.     omeprazole 20 MG capsule  Commonly known as:  PRILOSEC  Take 1 capsule (20 mg total) by mouth daily.     onetouch ultrasoft lancets  Test one time a day and prn  Dx e11.9     propranolol ER 80 MG 24 hr capsule  Commonly known as:  INDERAL LA  Take 1 capsule (80 mg total) by mouth daily.     sitaGLIPtin-metformin 50-1000 MG tablet  Commonly known as:  JANUMET  Take 1 tablet by mouth 2 (two) times daily with a meal.     Trospium Chloride 60 MG Cp24  TAKE 1 CAPSULE (60 MG TOTAL) BY MOUTH DAILY.         The results of significant diagnostics from this hospitalization (including imaging, microbiology, ancillary and laboratory) are listed below for reference.    Significant Diagnostic Studies: Dg Chest 2 View  03/05/2015  CLINICAL DATA:  Acute CVA.  Initial encounter. EXAM: CHEST  2 VIEW COMPARISON:  Chest  radiograph performed 12/09/2013 FINDINGS: The lungs are well-aerated. Mild vascular congestion is noted. There is no evidence of focal opacification, pleural effusion or pneumothorax. A calcified granuloma is again noted at the left midlung zone. The heart is borderline normal in size. The patient is status post median sternotomy, with evidence of prior CABG. A loop recorder is noted. No acute osseous abnormalities are seen. Anterior bridging osteophytes are noted along the thoracic spine. IMPRESSION: Mild vascular congestion noted.  Lungs remain grossly clear. Electronically Signed   By: Roanna Raider M.D.   On: 03/05/2015 21:17   Ct Head Wo Contrast  03/05/2015  CLINICAL DATA:  Slurred speech, confusion, code stroke EXAM: CT  HEAD WITHOUT CONTRAST TECHNIQUE: Contiguous axial images were obtained from the base of the skull through the vertex without intravenous contrast. COMPARISON:  11/25/2012 FINDINGS: No skull fracture is noted. No intracranial hemorrhage, mass effect or midline shift. Paranasal sinuses and mastoid air cells are unremarkable. Mild atherosclerotic calcifications of carotid siphon. Mild cerebral atrophy. Mild periventricular white matter decreased attenuation probable due to chronic small vessel ischemic changes. No definite acute cortical infarction. No mass lesion is noted on this unenhanced scan. There is small area of decreased attenuation in left basal ganglia just posterior to caudate nucleus measures about 9 mm. This is probable due to ischemia of indeterminate age. This was not present from prior exam. IMPRESSION: No definite acute cortical infarction. Interval small area of decreased attenuation left basal ganglia just posterior to caudate nucleus measures 9 mm. Probable due to ischemia indeterminate age. Clinical correlation is necessary. Please see axial image 13. Mild cerebral atrophy. Mild periventricular white matter decreased attenuation probable due to chronic small vessel  ischemic changes. These results were called by telephone at the time of interpretation on 03/05/2015 at 4:52 pm to Dr. Amada Jupiter, who verbally acknowledged these results. Electronically Signed   By: Natasha Mead M.D.   On: 03/05/2015 16:52   Mr Brain Wo Contrast  03/05/2015  CLINICAL DATA:  Expressive aphasia for 5 hours prior to admission, confusion. Assess acute stroke. History of hyperlipidemia, hypertension and diabetes. EXAM: MRI HEAD WITHOUT CONTRAST MRA HEAD WITHOUT CONTRAST TECHNIQUE: Multiplanar, multiecho pulse sequences of the brain and surrounding structures were obtained without intravenous contrast. Angiographic images of the head were obtained using MRA technique without contrast. COMPARISON:  CT head March 05, 2015 at 1635 hours and MRI head February 09, 2014 FINDINGS: MRI HEAD FINDINGS 11 x 13 mm focus of acute ischemia, LEFT internal capsule genu extending to the globus pallidus with corresponding low ADC values. No additional foci of acute ischemia. No susceptibility artifact to suggest hemorrhage. No midline shift, mass effect or mass lesions. Ventricles and sulci are normal for patient's age. Scattered subcentimeter supratentorial white matter FLAIR T2 hyperintensities are less than expected for age most compatible with chronic small vessel ischemic disease. Small inferior basal ganglia perivascular spaces. No abnormal extra-axial fluid collections. Status post bilateral ocular lens implants. Trace paranasal sinus mucosal thickening without air-fluid levels. The mastoid air cells are well aerated. No abnormal sellar expansion. No cerebellar tonsillar ectopia. No suspicious calvarial bone marrow signal. MRA HEAD FINDINGS Anterior circulation: Normal flow related enhancement of the included cervical, petrous, cavernous and supraclinoid internal carotid arteries very mild narrowing of the bilateral supraclinoid internal carotid arteries, corresponding to calcific atherosclerosis on today's  head CT. Patent anterior communicating artery. Normal flow related enhancement of the anterior and middle cerebral arteries, including distal segments. Tiny supernumerary anterior cerebral artery arises from the RIGHT A1-2 junction. Mild luminal irregularity of the mid to distal anterior and middle cerebral arteries most compatible with atherosclerosis. No large vessel occlusion, high-grade stenosis, aneurysm. Posterior circulation: LEFT vertebral artery is dominant. Basilar artery is patent, with normal flow related enhancement of the main branch vessels. Patent bilateral posterior cerebral arteries. High-grade stenosis origin RIGHT P1. Tandem high-grade stenosis bilateral P2 and P3 segments. No large vessel occlusion, abnormal luminal irregularity, aneurysm. IMPRESSION: MRI HEAD: 11 x 13 mm acute ischemia LEFT internal capsule/basal ganglia corresponding to today's CT finding. Otherwise negative MRI of the brain for age. MRA HEAD: No acute large vessel occlusion. High-grade stenoses bilateral posterior cerebral arteries. Mild luminal irregularity of  the anterior and middle cerebral arteries compatible with atherosclerosis. Mild bilateral supraclinoid internal carotid artery status stenosis corresponding to calcific atherosclerosis on today's head CT. Electronically Signed   By: Awilda Metro M.D.   On: 03/05/2015 22:34   Mr Ayodele Glenn Head/brain Wo Cm  03/05/2015  CLINICAL DATA:  Expressive aphasia for 5 hours prior to admission, confusion. Assess acute stroke. History of hyperlipidemia, hypertension and diabetes. EXAM: MRI HEAD WITHOUT CONTRAST MRA HEAD WITHOUT CONTRAST TECHNIQUE: Multiplanar, multiecho pulse sequences of the brain and surrounding structures were obtained without intravenous contrast. Angiographic images of the head were obtained using MRA technique without contrast. COMPARISON:  CT head March 05, 2015 at 1635 hours and MRI head February 09, 2014 FINDINGS: MRI HEAD FINDINGS 11 x 13 mm focus  of acute ischemia, LEFT internal capsule genu extending to the globus pallidus with corresponding low ADC values. No additional foci of acute ischemia. No susceptibility artifact to suggest hemorrhage. No midline shift, mass effect or mass lesions. Ventricles and sulci are normal for patient's age. Scattered subcentimeter supratentorial white matter FLAIR T2 hyperintensities are less than expected for age most compatible with chronic small vessel ischemic disease. Small inferior basal ganglia perivascular spaces. No abnormal extra-axial fluid collections. Status post bilateral ocular lens implants. Trace paranasal sinus mucosal thickening without air-fluid levels. The mastoid air cells are well aerated. No abnormal sellar expansion. No cerebellar tonsillar ectopia. No suspicious calvarial bone marrow signal. MRA HEAD FINDINGS Anterior circulation: Normal flow related enhancement of the included cervical, petrous, cavernous and supraclinoid internal carotid arteries very mild narrowing of the bilateral supraclinoid internal carotid arteries, corresponding to calcific atherosclerosis on today's head CT. Patent anterior communicating artery. Normal flow related enhancement of the anterior and middle cerebral arteries, including distal segments. Tiny supernumerary anterior cerebral artery arises from the RIGHT A1-2 junction. Mild luminal irregularity of the mid to distal anterior and middle cerebral arteries most compatible with atherosclerosis. No large vessel occlusion, high-grade stenosis, aneurysm. Posterior circulation: LEFT vertebral artery is dominant. Basilar artery is patent, with normal flow related enhancement of the main branch vessels. Patent bilateral posterior cerebral arteries. High-grade stenosis origin RIGHT P1. Tandem high-grade stenosis bilateral P2 and P3 segments. No large vessel occlusion, abnormal luminal irregularity, aneurysm. IMPRESSION: MRI HEAD: 11 x 13 mm acute ischemia LEFT internal  capsule/basal ganglia corresponding to today's CT finding. Otherwise negative MRI of the brain for age. MRA HEAD: No acute large vessel occlusion. High-grade stenoses bilateral posterior cerebral arteries. Mild luminal irregularity of the anterior and middle cerebral arteries compatible with atherosclerosis. Mild bilateral supraclinoid internal carotid artery status stenosis corresponding to calcific atherosclerosis on today's head CT. Electronically Signed   By: Awilda Metro M.D.   On: 03/05/2015 22:34     Microbiology: Recent Results (from the past 240 hour(s))  Culture, Urine     Status: None (Preliminary result)   Collection Time: 03/06/15  2:27 PM  Result Value Ref Range Status   Specimen Description URINE, RANDOM  Final   Special Requests NONE  Final   Culture >=100,000 COLONIES/mL GRAM NEGATIVE RODS  Final   Report Status PENDING  Incomplete     Labs: Basic Metabolic Panel:  Recent Labs Lab 03/05/15 1630 03/05/15 1636  03/06/15 0936 03/07/15 0542  NA 131* 133*  --  133* 136  K 6.8* 6.5*  < > 4.2 4.3  CL 99* 100*  --  101 102  CO2 18*  --   --  25 25  GLUCOSE 338* 345*  --  277* 197*  BUN 17 29*  --  16 15  CREATININE 0.97 0.80  --  0.86 0.84  CALCIUM 8.5*  --   --  8.5* 9.1  < > = values in this interval not displayed. Liver Function Tests:  Recent Labs Lab 03/05/15 1630 03/06/15 0936  AST 81* 40  ALT 37 27  ALKPHOS 58 50  BILITOT 1.1 0.8  PROT RESULTS UNAVAILABLE DUE TO INTERFERING SUBSTANCE 5.8*  ALBUMIN 3.4* 3.0*   No results for input(s): LIPASE, AMYLASE in the last 168 hours. No results for input(s): AMMONIA in the last 168 hours. CBC:  Recent Labs Lab 03/05/15 1630 03/05/15 1636  WBC 9.6  --   NEUTROABS 5.8  --   HGB 14.2 15.3*  HCT 42.5 45.0  MCV 82.4  --   PLT 252  --    Cardiac Enzymes: No results for input(s): CKTOTAL, CKMB, CKMBINDEX, TROPONINI in the last 168 hours. BNP: Invalid input(s): POCBNP CBG:  Recent Labs Lab  03/07/15 0636 03/07/15 1153 03/07/15 1650 03/07/15 2151 03/08/15 0646  GLUCAP 225* 153* 224* 199* 197*    Time coordinating discharge:  Greater than 30 minutes  Signed:  Epifania Littrell, DO [facility] Pager: [phone] 03/08/2015, 12:48 PM

## 2015-03-06 NOTE — Progress Notes (Signed)
Pt arrived from ER transported via stretcher with ER staff member x1. Family members at bedside as well. Pt alert and oriented with slurred speech at times or "word salad". Very pleasant and cooperative. Moved from stretcher to bed with +3 assist. No acute episodes at this time.

## 2015-03-07 ENCOUNTER — Ambulatory Visit (INDEPENDENT_AMBULATORY_CARE_PROVIDER_SITE_OTHER): Payer: Federal, State, Local not specified - PPO | Admitting: *Deleted

## 2015-03-07 ENCOUNTER — Ambulatory Visit (HOSPITAL_COMMUNITY): Payer: Medicare Other

## 2015-03-07 DIAGNOSIS — I6789 Other cerebrovascular disease: Secondary | ICD-10-CM

## 2015-03-07 DIAGNOSIS — R55 Syncope and collapse: Secondary | ICD-10-CM | POA: Diagnosis not present

## 2015-03-07 LAB — BASIC METABOLIC PANEL
Anion gap: 9 (ref 5–15)
BUN: 15 mg/dL (ref 6–20)
CO2: 25 mmol/L (ref 22–32)
Calcium: 9.1 mg/dL (ref 8.9–10.3)
Chloride: 102 mmol/L (ref 101–111)
Creatinine, Ser: 0.84 mg/dL (ref 0.44–1.00)
GFR calc Af Amer: >60 mL/min (ref >60)
GFR calc non Af Amer: >60 mL/min (ref >60)
Glucose, Bld: 197 mg/dL — ABNORMAL HIGH (ref 65–99)
Potassium: 4.3 mmol/L (ref 3.5–5.1)
Sodium: 136 mmol/L (ref 135–145)

## 2015-03-07 LAB — GLUCOSE, CAPILLARY
Glucose-Capillary: 225 mg/dL — ABNORMAL HIGH (ref 65–99)
Glucose-Capillary: 153 mg/dL — ABNORMAL HIGH (ref 65–99)
Glucose-Capillary: 224 mg/dL — ABNORMAL HIGH (ref 65–99)
Glucose-Capillary: 199 mg/dL — ABNORMAL HIGH (ref 65–99)

## 2015-03-07 LAB — HEMOGLOBIN A1C
Hgb A1c MFr Bld: 11 % — ABNORMAL HIGH (ref 4.8–5.6)
Mean Plasma Glucose: 269 mg/dL

## 2015-03-07 MED ORDER — ATORVASTATIN CALCIUM 20 MG PO TABS: 20.0000 mg | ORAL_TABLET | Freq: Every day | ORAL | Status: DC

## 2015-03-07 MED ORDER — INSULIN ASPART 100 UNIT/ML ~~LOC~~ SOLN
4.0000 [IU] | Freq: Three times a day (TID) | SUBCUTANEOUS | Status: DC
  Administered 2015-03-08 – 2015-03-09 (×5): 4 [IU] via SUBCUTANEOUS

## 2015-03-07 MED ORDER — INSULIN DETEMIR 100 UNIT/ML ~~LOC~~ SOLN
14.0000 [IU] | Freq: Two times a day (BID) | SUBCUTANEOUS | Status: DC
  Administered 2015-03-07 – 2015-03-09 (×4): 14 [IU] via SUBCUTANEOUS
  Filled 2015-03-07 (×5): qty 0.14

## 2015-03-07 MED ORDER — PROPRANOLOL HCL ER 80 MG PO CP24: 80.0000 mg | ORAL_CAPSULE | Freq: Every day | ORAL | Status: DC

## 2015-03-07 NOTE — Progress Notes (Signed)
Pt sister reported to this nurse that she had concerns about pt diet and requested that this nurse educate pt and daughter. Reported to pts room noted pt and daughter eating a burger and fries. Pt and daughter educated on importance of following diet.  Daughter stated that she was going to eat the burger today but was not going to allow her to eat unhealthy at home as she would be moving in to care for her. Pt denies any discomfort. No noted distress. Md paged to notify. Will continue to monitor.

## 2015-03-07 NOTE — Progress Notes (Signed)
[facility] Summary Report / Loop Recorder 

## 2015-03-07 NOTE — Care Management Note (Signed)
Case Management Note  Patient Details  Name: Leah Levine MRN: [id number] Date of Birth: [birth year]  Subjective/Objective:                    Action/Plan: Patient admitted with CVA. Patient is from home with family. Plan is to discharge home with home health services. CM met with the patient and her daughter and provided them a list of home health agencies in the [facility] area. They selected [facility] which has become [facility]. CM spoke with [facility] and faxed them the information requested. CM will continue to follow for further dishcarge needs.   Expected Discharge Date:  03/07/15               Expected Discharge Plan:  [place]  In-House Referral:     Discharge planning Services  CM Consult  Post Acute Care Choice:  Home Health Choice offered to:  Patient, Adult Children  DME Arranged:    DME Agency:     HH Arranged:  PT, OT, Social Work [place] Agency:  [facility]  Status of Service:  In process, will continue to follow  Medicare Important Message Given:    Date Medicare IM Given:    Medicare IM give by:    Date Additional Medicare IM Given:    Additional Medicare Important Message give by:     If discussed at [place] [facility] of Stay Meetings, dates discussed:    Additional Comments:  Pollie Friar, RN 03/07/2015, 10:43 AM

## 2015-03-07 NOTE — Progress Notes (Signed)
Physical Therapy Treatment Patient Details Name: Leah Levine MRN: [id number] DOB: [birth year] Today's Date: 03/07/2015    History of Present Illness Leah Levine is an 81 y.o. female who was in her normal state of health this am until around noon at which point she began having slurred speech and saying things that were not correct. She denies numbness or weakness. She was able to communicate fairly well, but when showed pctures, she has marked difficulty with naming of objects. PMH: tremor, B TKA, B CTR, DM2, HTN, CAD, back surgery, c-spine fusion, facial reconstruction with one eyebrow permanently raised and eyelid left.    PT Comments    Pt progressing towards physical therapy goals. Was able to perform transfers and ambulation with gross min assist for walker management and balance support. Pt frequently holding walker up in air to negotiate around tight spaces. Per pt's family member their bathroom space is very limited, and managing a walker around the toilet area may be difficult. Family members were educated on safety with RW use during bathroom time (recommended RW vs. rollator which is also available). Will continue to follow.   Follow Up Recommendations  Home health PT;Supervision/Assistance - 24 hour     Equipment Recommendations  None recommended by PT    Recommendations for Other Services OT consult     Precautions / Restrictions Precautions Precautions: Fall Restrictions Weight Bearing Restrictions: No    Mobility  Bed Mobility Overal bed mobility: Needs Assistance Bed Mobility: Supine to Sit     Supine to sit: Min assist     General bed mobility comments: Assist to elevate trunk to full sitting position at EOB. Pt was able to initiate transfer and scoot fairly well.   Transfers Overall transfer level: Needs assistance Equipment used: Rolling walker (2 wheeled) Transfers: Sit to/from Stand Sit to Stand: Min assist         General  transfer comment: Pt required min assist for steadying and support as she powered-up to full standing position. VC's for proper hand placement on seated surface for safety.   Ambulation/Gait Ambulation/Gait assistance: Min assist Ambulation Distance (Feet): 200 Feet Assistive device: Rolling walker (2 wheeled) Gait Pattern/deviations: Step-through pattern;Decreased stride length;Trunk flexed;Drifts right/left Gait velocity: decreased Gait velocity interpretation: Below normal speed for age/gender General Gait Details: Pt drifting to the left side this session, but was able to correct with cued to walk in the middle of the hallway. Pt was motivated for distance, however appeared fatigued at end of session with heavy breathing noted. This resolved within 30 seconds of sitting rest break.    Stairs            Wheelchair Mobility    Modified Rankin (Stroke Patients Only) Modified Rankin (Stroke Patients Only) Pre-Morbid Rankin Score: Moderate disability Modified Rankin: Moderately severe disability     Balance Overall balance assessment: Needs assistance Sitting-balance support: Feet supported;No upper extremity supported Sitting balance-Leahy Scale: Fair     Standing balance support: Bilateral upper extremity supported;During functional activity Standing balance-Leahy Scale: Poor Standing balance comment: when standing for perineal care, required one hand on RW.                     Cognition Arousal/Alertness: Awake/alert Behavior During Therapy: WFL for tasks assessed/performed Overall Cognitive Status: Impaired/Different from baseline Area of Impairment: Memory;Following commands;Safety/judgement;Awareness;Problem solving;Attention   Current Attention Level: Sustained Memory: Decreased short-term memory Following Commands: Follows one step commands inconsistently;Follows multi-step commands inconsistently Safety/Judgement: Decreased awareness of  deficits;Decreased awareness of safety Awareness: Emergent Problem Solving: Difficulty sequencing;Requires verbal cues      Exercises      General Comments        Pertinent Vitals/Pain Pain Assessment: Faces Faces Pain Scale: No hurt    Home Living                      Prior Function            PT Goals (current goals can now be found in the care plan section) Acute Rehab PT Goals Patient Stated Goal: return home PT Goal Formulation: With patient Time For Goal Achievement: 03/20/15 Potential to Achieve Goals: Good Progress towards PT goals: Progressing toward goals    Frequency  Min 4X/week    PT Plan Current plan remains appropriate    Co-evaluation             End of Session Equipment Utilized During Treatment: Gait belt Activity Tolerance: Patient tolerated treatment well Patient left: in chair;with call bell/phone within reach;with family/visitor present;with chair alarm set     Time: [id number] PT Time Calculation (min) (ACUTE ONLY): 26 min  Charges:  $Gait Training: 23-37 mins                    G Codes:      Rolinda Roan Mar 14, 2015, 2:18 PM   Rolinda Roan, PT, DPT Acute Rehabilitation Services Pager: [phone]

## 2015-03-07 NOTE — Progress Notes (Signed)
Pt stood to pivot with assist x1 to sit up in chair to eat breakfast. No noted distress. Safety measures in place. Daughter by her side. Call bell within reach. Will continue to monitor.

## 2015-03-07 NOTE — Evaluation (Signed)
Speech Language Pathology Evaluation Patient Details Name: Maybellene Famularo MRN: [id number] DOB: 1932/05/18 Today's Date: 03/07/2015 Time: 5284-1324 SLP Time Calculation (min) (ACUTE ONLY): 22 min  Problem List:  Patient Active Problem List   Diagnosis Date Noted  . Stroke (cerebrum) (HCC) 03/05/2015  . Expressive aphasia 03/05/2015  . Acute CVA (cerebrovascular accident) (HCC) 03/05/2015  . Syncope 12/09/2013  . CAD (coronary artery disease) 09/18/2013  . Hypothyroidism 06/18/2013  . S/P CABG x 4 12/01/2012  . Vitamin B12 deficiency 12/05/2010  . Personal history of colonic polyps 12/05/2010  . Esophageal dysphagia 11/20/2010  . Benign neoplasm of colon 11/20/2010  . Gastritis, chronic 11/20/2010  . Allergic rhinitis 07/03/2010  . Tremor   . GERD (gastroesophageal reflux disease)   . Hyperlipidemia   . Hypertension   . Urinary incontinence   . Diabetes mellitus type 2, uncontrolled (HCC)    Past Medical History:  Past Medical History  Diagnosis Date  . Tremor   . GERD (gastroesophageal reflux disease)   . Hyperlipidemia   . Allergy   . Diabetes mellitus type 2, controlled (HCC)   . Hypertension     patient denies ever having hypertension  . Personal history of colonic polyps 11/20/2010    tubular adenomas  . Hypothyroidism   . Anemia   . Skin cancer     ear, right side of cheek  . CAD (coronary artery disease) 11-2012    CABG x 4 utilizing LIMA to LAD, SVG to Diagonal, SVG to Left Circumflex, and SVG to RCA   Past Surgical History:  Past Surgical History  Procedure Laterality Date  . Appendectomy    . Cholecystectomy    . Abdominal hysterectomy    . Knee arthroscopy      bilateral  . Carpal tunnel release    . Back surgery    . Coronary artery bypass graft N/A 11/26/2012    Procedure: CORONARY ARTERY BYPASS GRAFTING (CABG);  Surgeon: Delight Ovens, MD;  Location: [facility] OR;  Service: Open Heart Surgery;  Laterality: N/A;  . Intraoperative  transesophageal echocardiogram N/A 11/26/2012    Procedure: INTRAOPERATIVE TRANSESOPHAGEAL ECHOCARDIOGRAM;  Surgeon: Delight Ovens, MD;  Location: [facility] OR;  Service: Open Heart Surgery;  Laterality: N/A;  . Left heart catheterization with coronary angiogram N/A 11/25/2012    Procedure: LEFT HEART CATHETERIZATION WITH CORONARY ANGIOGRAM;  Surgeon: [name], MD;  Location: [facility] CATH LAB;  Service: Cardiovascular;  Laterality: N/A;  . Loop recorder implant N/A 12/10/2013    Procedure: LOOP RECORDER IMPLANT;  Surgeon: Gardiner Rhyme, MD;  Location: [facility] CATH LAB;  Service: Cardiovascular;  Laterality: N/A;  . Neck surgery     HPI:  Nialah Torgeson is an 81 y.o. female who was admitted with slurred speech and aphasia.  PMH: tremor, B TKA, B CTR, DM2, HTN, CAD, back surgery, c-spine fusion, facial reconstruction with one eyebrow permanently raised and eyelid left.   Assessment / Plan / Recommendation Clinical Impression  Speech-language evaluation complete.  Patient demonstrates a Broca's like aphasia with expressive language more impaired then receptive language abilities.  Patient with appropriate spontaneous utterances and questionable language of confusion at times.  Written, sentence completion and phonemic cues were effective to increase accuracy with confrontational naming.  Patient able to follow 2 step commands with increased time and written cues.  Patient will required skilled acute and next level of care SLP services in order to maximize functional independence with 24/7 supervision upon discharge.  SLP Assessment  Patient needs continued Speech Lanaguage Pathology Services    Follow Up Recommendations  Outpatient SLP;24 hour supervision/assistance;Home health SLP;Inpatient Rehab    Frequency and Duration min 2x/week  1 week      SLP Evaluation Prior Functioning  Cognitive/Linguistic Baseline: Within functional limits Type of Home: House  Lives With: Alone Available  Help at Discharge: Family;Available 24 hours/day   Cognition  Overall Cognitive Status: Impaired/Different from baseline Orientation Level: Oriented to person;Oriented to place;Disoriented to time;Disoriented to situation Attention: Sustained Sustained Attention: Impaired Sustained Attention Impairment: Verbal basic;Functional basic Memory:  (difficult to assess) Awareness: Impaired Awareness Impairment: Intellectual impairment Problem Solving: Impaired Executive Function: Self Monitoring;Self Correcting Self Monitoring: Impaired Self Monitoring Impairment: Verbal basic Self Correcting: Impaired Self Correcting Impairment: Verbal basic Safety/Judgment: Impaired    Comprehension  Auditory Comprehension Overall Auditory Comprehension: Impaired Yes/No Questions: Impaired Basic Immediate Environment Questions: 75-100% accurate Commands: Impaired Multistep Basic Commands: 50-74% accurate Conversation: Simple Interfering Components: Attention;Pain EffectiveTechniques: Extra processing time;Other (Comment) (written instructions) Visual Recognition/Discrimination Discrimination: Within Function Limits (with basic) Reading Comprehension Reading Status: Impaired Sentence Level: Within functional limits Paragraph Level: Impaired Effective Techniques: Eye glasses;Large print    Expression Expression Primary Mode of Expression: Verbal Verbal Expression Overall Verbal Expression: Impaired Initiation: No impairment Level of Generative/Spontaneous Verbalization: Phrase;Sentence Repetition: No impairment Naming: Impairment Confrontation: Impaired Convergent: Not tested Divergent: Not tested Verbal Errors: Semantic paraphasias;Not aware of errors Interfering Components: Attention Effective Techniques: Sentence completion;Phonemic cues;Written cues Non-Verbal Means of Communication: Not applicable Written Expression Dominant Hand: Right Written Expression: Exceptions to [facility] Self  Formulation Ability: Word (name)   Oral / Motor Oral Motor/Sensory Function Overall Oral Motor/Sensory Function: Within functional limits Motor Speech Overall Motor Speech: Appears within functional limits for tasks assessed    Charlane Ferretti., CCC-SLP [phone]  Jassiel Flye 03/07/2015, 3:39 PM

## 2015-03-07 NOTE — Progress Notes (Signed)
Occupational Therapy Evaluation Patient Details Name: Leah Levine MRN: [id number] DOB: 04/01/1932 Today's Date: 03/07/2015    History of Present Illness Leah Levine is an 81 y.o. female who was in her normal state of health this am until around noon at which point she began having slurred speech and saying things that were not correct. She denies numbness or weakness. She was able to communicate fairly well, but when showed pctures, she has marked difficulty with naming of objects. PMH: tremor, B TKA, B CTR, DM2, HTN, CAD, back surgery, c-spine fusion, facial reconstruction with one eyebrow permanently raised and eyelid left.   Clinical Impression   PTA, pt was independent with most ADLs and used a SPC or RW for mobility. Pt currently presents with receptive/expressive aphasia, impaired balance, increased fatigue, and possibly L inattention (difficult to discern from L visual deficit due to aphasia). Pt was min assist for transfers and mod assist for LB ADLs. Pt will benefit from acute skilled OT to increase independence and safety with ADLs and functional mobility to allow for safe discharge home with assist from family. Recommending HHOT for ADL retraining and fall risk evaluation. No DME needs at this time, pt already has necessary equipment.     Follow Up Recommendations  Home health OT;Supervision/Assistance - 24 hour    Equipment Recommendations  None recommended by OT    Recommendations for Other Services       Precautions / Restrictions Precautions Precautions: Fall Restrictions Weight Bearing Restrictions: No      Mobility Bed Mobility Overal bed mobility: Needs Assistance Bed Mobility: Rolling;Sidelying to Sit Rolling: Min assist Sidelying to sit: Min assist Supine to sit: Min assist     General bed mobility comments: HOB flat, use of bedrails. Pt unable to progress supine-sit and daughter attempting to lift pt. Suggested rolling onto side pt  required min assist for this and to elevate/support trunk into sitting position. Assist from daughter to scoot hips to EOB.  Transfers Overall transfer level: Needs assistance Equipment used: Rolling walker (2 wheeled) Transfers: Sit to/from Stand Sit to Stand: Min assist         General transfer comment: Min assist for balance upon standing. Verbal cues for safe hand placement on seated surface.    Balance Overall balance assessment: Needs assistance Sitting-balance support: No upper extremity supported;Feet supported Sitting balance-Leahy Scale: Fair     Standing balance support: No upper extremity supported;During functional activity Standing balance-Leahy Scale: Poor Standing balance comment: One hand on RW or grab bar for pericare and to don pants                            ADL Overall ADL's : Needs assistance/impaired     Grooming: Wash/dry hands;Min guard;Standing;Cueing for safety Grooming Details (indicate cue type and reason): Verbal cues for safe positioning of RW             Lower Body Dressing: Moderate assistance;Sit to/from stand   Toilet Transfer: Minimal assistance;Ambulation;BSC;RW;Grab bars;Cueing for safety Toilet Transfer Details (indicate cue type and reason): Verbal cues to position self in safe place before sitting Toileting- Clothing Manipulation and Hygiene: Min guard;Sit to/from stand       Functional mobility during ADLs: Minimal assistance;Cueing for safety;Rolling walker General ADL Comments: Pt is very independent and somewhat impulsive during session. Pt is a questionable historian due to receptive/expressive aphasia. Pt is min assist for transfers and ambulation - pt with possible  L side inattention? Not attending to L side and bumps into obstacles and walls with RW. Verbal cues for safe hand placement and use of RW.  Mod assist for LB ADLs due to fatigue. Pt's daughter supportive but attempts to do a lot for the pt and requires  some cues to allow pt to do for herself.     Vision Vision Assessment?: Yes Eye Alignment: Within Functional Limits Ocular Range of Motion: Within Functional Limits Alignment/Gaze Preference: Within Defined Limits Tracking/Visual Pursuits: Decreased smoothness of horizontal tracking;Decreased smoothness of vertical tracking Convergence: Impaired (comment) Visual Fields: Left visual field deficit Additional Comments: Nystagmus noted with horizontal and vertical eye movement. With extinction testing pt only attending to L side stimulus - question L side neglect? despite running into obstacles on L side when ambulating    Perception     Praxis      Pertinent Vitals/Pain Pain Assessment: No/denies pain Faces Pain Scale: Hurts even more Pain Location: left jaw, ear, neck area Pain Descriptors / Indicators: Grimacing;Aching Pain Intervention(s): Limited activity within patient's tolerance;RN gave pain meds during session     Hand Dominance Right   Extremity/Trunk Assessment Upper Extremity Assessment Upper Extremity Assessment: Generalized weakness   Lower Extremity Assessment Lower Extremity Assessment: Defer to PT evaluation   Cervical / Trunk Assessment Cervical / Trunk Assessment: Kyphotic   Communication Communication Communication: Receptive difficulties;Expressive difficulties   Cognition Arousal/Alertness: Awake/alert Behavior During Therapy: WFL for tasks assessed/performed Overall Cognitive Status: Impaired/Different from baseline Area of Impairment: Memory;Following commands;Safety/judgement;Awareness;Problem solving;Attention   Current Attention Level: Sustained Memory: Decreased short-term memory Following Commands: Follows one step commands inconsistently;Follows multi-step commands inconsistently Safety/Judgement: Decreased awareness of deficits;Decreased awareness of safety Awareness: Emergent Problem Solving: Difficulty sequencing;Requires verbal  cues General Comments: pt with nonsensical speech, word finding difficulties, occasional clear speech   General Comments       Exercises       Shoulder Instructions      Home Living Family/patient expects to be discharged to:: Private residence Living Arrangements: Alone Available Help at Discharge: Family;Available 24 hours/day Type of Home: House Home Access: Stairs to enter [employer] of Steps: 2 Entrance Stairs-Rails: Can reach both;Right;Left Home Layout: Multi-level;Able to live on main level with bedroom/bathroom     Bathroom Shower/Tub: Tub/shower unit;Curtain Shower/tub characteristics: Architectural technologist: Standard     Home Equipment: Environmental consultant - 2 wheels;Bedside commode;Cane - single point      Lives With: Alone    Prior Functioning/Environment Level of Independence: Needs assistance  Gait / Transfers Assistance Needed: Uses SPC and RW ADL's / Homemaking Assistance Needed: Assist to get in/out of shower, assist with meals        OT Diagnosis: Generalized weakness;Disturbance of vision;Cognitive deficits   OT Problem List: Decreased strength;Decreased range of motion;Decreased activity tolerance;Impaired balance (sitting and/or standing);Impaired vision/perception;Decreased cognition;Decreased coordination;Decreased knowledge of use of DME or AE;Decreased safety awareness;Obesity   OT Treatment/Interventions: Self-care/ADL training;Therapeutic exercise;DME and/or AE instruction;Energy conservation;Therapeutic activities;Visual/perceptual remediation/compensation;Patient/family education;Balance training;Cognitive remediation/compensation    OT Goals(Current goals can be found in the care plan section) Acute Rehab OT Goals Patient Stated Goal: to go to family reunion on Saturday OT Goal Formulation: With patient Time For Goal Achievement: 03/21/15 Potential to Achieve Goals: Good ADL Goals Pt Will Perform Lower Body Dressing: with modified  independence;sitting/lateral leans;sit to/from stand Pt Will Transfer to Toilet: with supervision;ambulating;bedside commode Pt Will Perform Toileting - Clothing Manipulation and hygiene: with supervision;sitting/lateral leans;sit to/from stand Additional ADL Goal #1: Pt will accurately identify  2/3 objects on L side when ambulating to increase safety with ADLs.  OT Frequency: Min 3X/week   Barriers to D/C:            Co-evaluation              End of Session Equipment Utilized During Treatment: Gait belt;Rolling walker Nurse Communication: Mobility status  Activity Tolerance: Patient limited by fatigue Patient left: in chair;with call bell/phone within reach;with chair alarm set;with family/visitor present   Time: [id number] OT Time Calculation (min): 31 min Charges:  OT General Charges $OT Visit: 1 Procedure OT Evaluation $Initial OT Evaluation Tier I: 1 Procedure OT Treatments $Self Care/Home Management : 8-22 mins G-Codes:    Redmond Baseman, OTR/L 01-Apr-2015, 5:27 PM

## 2015-03-07 NOTE — Progress Notes (Signed)
Inpatient Diabetes Program Recommendations  AACE/ADA: New Consensus Statement on Inpatient Glycemic Control (2015)  Target Ranges:  Prepandial:   less than 140 mg/dL      Peak postprandial:   less than 180 mg/dL (1-2 hours)      Critically ill patients:  140 - 180 mg/dL    Results for Leah Levine, Leah Levine (MRN [id number]) as of 03/07/2015 14:08  Ref. Range 11/11/2014 11:36 03/06/2015 02:35  Hemoglobin A1C Latest Ref Range: 4.8-5.6 % 13.0 11.0 (H)    Results for Leah Levine, Leah Levine (MRN [id number]) as of 03/07/2015 14:08  Ref. Range 03/06/2015 06:38 03/06/2015 11:18 03/06/2015 16:30 03/06/2015 22:31  Glucose-Capillary Latest Ref Range: 65-99 mg/dL 232 (H) 235 (H) 169 (H) 212 (H)    Admit with: CVA  History: DM, CABG  Home DM Meds: Glipizide 5 mg bid       Janumet 50/1000- 1 tablet bid  Current Insulin Orders: Levemir 10 units bid      Novolog Moderate SSI (0-15 units) TID AC + HS      Novolog 3 units tidwc     -Note through Chart Review that patient saw her PCP with [facility] on 11/11/14.  No changes were made to patient's home oral DM medication regimen.  -Patient then contacted PCP on 02/02/15 with CBG of 400 mg/dl at home.  Per notes, patient ran out of her DM meds and was eating ice cream that same day.  Unknown how may days patient had been out of her DM meds.  Was given 10 units Humalog in the PCP office and her Janumet was increased to 50/1000- 1 tablet bid (former dose was Janumet 50/500- 1 tablet bid).    -At her visit with PCP on 02/02/15, PCP wanted to start patient on long-acting basal insulin.  Patient refused starting insulin at that visit.  -Note SW has been contacted about this patient.  Per notes, patient's sister feels like patient is not being cared for at home.  Would like to speak with Social Work.    MD- Patient will likely refuse to take insulin at home as she has recently refused to start insulin as an outpatient.  Will  ask RD to visit with patient and family to review DM nutritional principles for home.    --Will follow patient during hospitalization--  Wyn Quaker RN, MSN, CDE Diabetes Coordinator Inpatient Glycemic Control Team Team Pager: [phone] (8a-5p)

## 2015-03-07 NOTE — Progress Notes (Addendum)
STROKE TEAM PROGRESS NOTE   HISTORY Leah Levine is an 81 y.o. female who was in her normal state of health this am until around noon (LKW 03/05/2015 at 12n) at which point she began having slurred speech and saying things that were not correct. She denies numbness or weakness. She was able to communicate fairly well, but when showed pctures, she has marked difficulty with naming of objects. tpa given?: no, outside of IV tpa window   SUBJECTIVE (INTERVAL HISTORY) Patient is still having difficulties particularly with word finding and some paraphasic errors.   OBJECTIVE Temp:  [97.3 F (36.3 C)-98.6 F (37 C)] 98.3 F (36.8 C) (12/12 0913) Pulse Rate:  [61-68] 61 (12/12 0913) Cardiac Rhythm:  [-] Normal sinus rhythm;Bundle branch block (12/12 0700) Resp:  [16-20] 18 (12/12 0913) BP: (101-145)/(57-80) 127/60 mmHg (12/12 0913) SpO2:  [94 %-98 %] 98 % (12/12 0913)  CBC:   Recent Labs Lab 03/05/15 1630 03/05/15 1636  WBC 9.6  --   NEUTROABS 5.8  --   HGB 14.2 15.3*  HCT 42.5 45.0  MCV 82.4  --   PLT 252  --     Basic Metabolic Panel:   Recent Labs Lab 03/06/15 0936 03/07/15 0542  NA 133* 136  K 4.2 4.3  CL 101 102  CO2 25 25  GLUCOSE 277* 197*  BUN 16 15  CREATININE 0.86 0.84  CALCIUM 8.5* 9.1   Lipid Panel:     Component Value Date/Time   CHOL 204* 03/06/2015 0235   CHOL 235* 11/11/2014 1125   CHOL 230* 09/03/2012 1456   TRIG 391* 03/06/2015 0235   TRIG 379* 06/18/2013 0911   TRIG 527* 09/03/2012 1456   HDL 29* 03/06/2015 0235   HDL 48 11/11/2014 1125   HDL 36* 06/18/2013 0911   HDL 35* 09/03/2012 1456   CHOLHDL 7.0 03/06/2015 0235   CHOLHDL 4.9* 11/11/2014 1125   VLDL 78* 03/06/2015 0235   LDLCALC 97 03/06/2015 0235   LDLCALC 137* 11/11/2014 1125   LDLCALC 58 06/18/2013 0911   LDLCALC 90 09/03/2012 1456   HgbA1c:  Lab Results  Component Value Date   HGBA1C 13.0 11/11/2014   Urine Drug Screen:     Component Value Date/Time   LABOPIA NONE DETECTED 03/05/2015 1848   COCAINSCRNUR NONE DETECTED 03/05/2015 1848   LABBENZ NONE DETECTED 03/05/2015 1848   AMPHETMU NONE DETECTED 03/05/2015 1848   THCU NONE DETECTED 03/05/2015 1848   LABBARB NONE DETECTED 03/05/2015 1848     IMAGING  Dg Chest 2 View 03/05/2015   Mild vascular congestion noted.  Lungs remain grossly clear.   Ct Head Wo Contrast 03/05/2015   No definite acute cortical infarction. Interval small area of decreased attenuation left basal ganglia just posterior to caudate nucleus measures 9 mm. Probable due to ischemia indeterminate age. Clinical correlation is necessary. Please see axial image 13. Mild cerebral atrophy. Mild periventricular white matter decreased attenuation probable due to chronic small vessel ischemic changes.   MRI HEAD:  03/05/2015   11 x 13 mm acute ischemia LEFT internal capsule/basal ganglia corresponding to today's CT finding. Otherwise negative MRI of the brain for age.   MRA HEAD:  03/05/2015   No acute large vessel occlusion. High-grade stenoses bilateral posterior cerebral arteries. Mild luminal irregularity of the anterior and middle cerebral arteries compatible with atherosclerosis. Mild bilateral supraclinoid internal carotid artery status stenosis corresponding to calcific atherosclerosis on today's head CT.   2D Echocardiogram  Left ventricle: The cavity size  was normal. Wall thickness wasnormal. Systolic function was normal. The estimated ejectionfraction was in the range of 55% to 60%. There was an increasedrelative contribution of atrial contraction to ventricularfilling. Doppler parameters are consistent with abnormal leftventricular relaxation (grade 1 diastolic dysfunction).   PHYSICAL EXAM General - Well nourished, well developed, in NAD   Cardiovascular - Regular rate and rhythm; murmur Pulmonary: CTA Abdomen: NT, ND, normal bowel sounds; obese Extremities: No C/C/E  Neurological Exam Mental  Status: Normal Orientation:  Oriented to person, place and time Speech:  Naming difficulty; follows commands very well. Occasional paraphasic errors. Nonfluent speech. Good comprehension difficulty with naming. Cranial Nerves:  PERRL; EOMI; visual fields full, face grossly symmetric, hearing grossly intact; shrug symmetric and tongue midline Motor Exam:  Tone:  Within normal limits; Strength: 5/5 throughout left; 4/5 in right LE; 3/5 in right upper extrenmity Sensory: Intact to light touch throughout Coordination:  Intact finger to nose on left Gait: Deferred   ASSESSMENT/PLAN Ms. Leah Levine is a 81 y.o. female with history of hyperlipidemia, history of syncope with loop implanted September 2015, diabetes mellitus, hypertension, hypothyroidism, coronary artery disease, and anemia presenting with speech difficulties. She did not receive IV t-PA due to late presentation.   Stroke:  L internal capsule/basal ganglia infarct secondary to small vessel disease   Resultant  Right sided weakness and aphasia  MRI  11 x 13 mm acute ischemia LEFT internal capsule/basal ganglia  MRA  High-grade stenoses bilateral posterior cerebral arteries.  Carotid Doppler  1-39% ICA plaquing. Vertebral artery flow is antegrade  2D Echo  No source of embolus   LDL - 97  HgbA1c  13.0 in Aug  VTE prophylaxis - subcutaneous heparin Diet Carb Modified Fluid consistency:: Thin; Room service appropriate?: Yes  aspirin 325 mg daily prior to admission, now on aspirin 325 mg daily. Continue at discharge  Patient counseled to be compliant with her antithrombotic medications  Ongoing aggressive stroke risk factor management  Therapy recommendations: ST for language, HH PT  Disposition:  Return home   [place]  Patient has a 10-15% risk of having another stroke over the next year, the highest risk is within 2 weeks of the most recent stroke/TIA (risk of  having a stroke following a stroke or TIA is the same).  Ongoing risk factor control by Primary Care Physician  Stroke Service will sign off. Please call should any needs arise.  Follow-up with Dr. Carles Collet  Hypertension Improved today  Hyperlipidemia  Home meds:  No lipid lowering medications prior to admission  LDL 97, goal < 70   Now on Lipitor 20 mg daily   Statin allergy listed - myalgias, muscle aches, and leg pain  Follow as an OP to assess tolerance  Diabetes  HgbA1c 13.0 in Aug, goal < 7.0  Uncontrolled  Other Stroke Risk Factors  Advanced age  Obesity, Body mass index is 34.75 kg/(m^2).   Family hx stroke (daughter)  Coronary artery disease  Other Active Problems  Mild vascular congestion on chest x-ray - monitor (no history of congestive heart failure )  [employer] loop implanted 2015  - no need for interrogation related to current stroke  Urine culture no growth, reincubated  Hospital day # [facility] for Pager information 03/07/2015 2:59 PM  I have personally examined this patient, reviewed notes, independently viewed imaging studies, participated in medical decision making and plan of care. I have  made any additions or clarifications directly to the above note. Agree with note above. She presented with speech difficulties and trouble finding words and MRI scan shows a left subcortical infarct etiology likely small vessel disease. She has prior strokes and has a loop recorder implanted but AFIB has not had been found. She remains at risk for neurological worsening, recurrent stroke, TIA needs ongoing evaluation and close neurological monitoring. Had a long discussion the patient and sister had a bedside regarding the stroke and plan for evaluation and answered questions I have attempted to contact this patient by phone with the following results:  Call for questions.. For questions. Stroke team will sign off. Antony Contras, MD Medical Director [facility] Pager: [phone] 03/07/2015 5:18 PM  To contact Stroke Continuity provider, please refer to [email or web address] After hours, contact General Neurology

## 2015-03-07 NOTE — Progress Notes (Signed)
Sister asked to speak with me and verbalized concerns about pt's home situation.  She feels that her family does not take good care of her (i.e. They encourage her to eat what she wants; daughter gives her "anxiety" meds that are prescribed to the daughter; states she falls and they do not have her evaluated, etc.).  Marjorie Smolder, [place] notified.

## 2015-03-07 NOTE — Progress Notes (Signed)
PROGRESS NOTE  Leah Levine WUJ:[id number] DOB: 02-Nov-1932 DOA: 03/05/2015 PCP: Bennie Pierini, FNP  Brief History 81 year old female with history of coronary artery disease with history of CABG, diabetes mellitus, hypertension, hyperlipidemia presented with dysphasia that started on the morning of admission. Currently, the patient was out to lunch with her sisters when she had word finding difficulties. In addition, the patient has had some lower extremity weakness which has been chronic. Patient denied any fevers, chills, chest discomfort, shortness breath, visual disturbance, focal extremity weakness. Evaluation in the emergency department revealed hyperkalemia with potassium 6.4, hyponatremia with sodium 131. CT of the brain revealed an area of decreased attenuation in left basal ganglia. MRI of the brain revealed acute ischemia left internal capsule. Neurology was consulted  Assessment/Plan: Acute nonhemorrhagic stroke -03/05/2015 MR brain--acute ischemia left internal capsule/basal ganglia -MRA brain negative for large vessel occlusion -Hemoglobin A1c--11.0 -LDL 97 -PT/OT--> home health PT and OT -home health ST -Carotid duplex--negative for hemodynamically significant lesions -Echo--EF 55-60%, grade 1 DD -Continue aspirin Diabetes mellitus type 2, uncontrolled -11/11/2014 hemoglobin A1c 13.0 -03/06/2015 hemoglobin A1c--11.0 -Hold glipizide and Jaunmet -After discussion with the patient and her daughter, pt agrees to take insulin at home -increase levemir to 14 units bid -novolog 4 units with meals -novolog sliding scale Hypertension -continue Inderal Hyperlipidemia -start statin Gait instability -PT/OT evaluation--home health PT/OT CAD with hx of CABG -continue ASA -continue BB--decrease Inderal to 80 mg once daily -no anginal symptoms Hyperkalemia -improved Hyponatremia -improved Pyuria -add urine culture  Family Communication:  Family update at beside Disposition Plan: Home 03/08/15 if stable     Procedures/Studies: Dg Chest 2 View  03/05/2015  CLINICAL DATA:  Acute CVA.  Initial encounter. EXAM: CHEST  2 VIEW COMPARISON:  Chest radiograph performed 12/09/2013 FINDINGS: The lungs are well-aerated. Mild vascular congestion is noted. There is no evidence of focal opacification, pleural effusion or pneumothorax. A calcified granuloma is again noted at the left midlung zone. The heart is borderline normal in size. The patient is status post median sternotomy, with evidence of prior CABG. A loop recorder is noted. No acute osseous abnormalities are seen. Anterior bridging osteophytes are noted along the thoracic spine. IMPRESSION: Mild vascular congestion noted.  Lungs remain grossly clear. Electronically Signed   By: Roanna Raider M.D.   On: 03/05/2015 21:17   Ct Head Wo Contrast  03/05/2015  CLINICAL DATA:  Slurred speech, confusion, code stroke EXAM: CT HEAD WITHOUT CONTRAST TECHNIQUE: Contiguous axial images were obtained from the base of the skull through the vertex without intravenous contrast. COMPARISON:  11/25/2012 FINDINGS: No skull fracture is noted. No intracranial hemorrhage, mass effect or midline shift. Paranasal sinuses and mastoid air cells are unremarkable. Mild atherosclerotic calcifications of carotid siphon. Mild cerebral atrophy. Mild periventricular white matter decreased attenuation probable due to chronic small vessel ischemic changes. No definite acute cortical infarction. No mass lesion is noted on this unenhanced scan. There is small area of decreased attenuation in left basal ganglia just posterior to caudate nucleus measures about 9 mm. This is probable due to ischemia of indeterminate age. This was not present from prior exam. IMPRESSION: No definite acute cortical infarction. Interval small area of decreased attenuation left basal ganglia just posterior to caudate nucleus measures 9 mm. Probable  due to ischemia indeterminate age. Clinical correlation is necessary. Please see axial image 13. Mild cerebral atrophy. Mild periventricular white matter decreased attenuation probable due to chronic small vessel ischemic  changes. These results were called by telephone at the time of interpretation on 03/05/2015 at 4:52 pm to Dr. Amada Jupiter, who verbally acknowledged these results. Electronically Signed   By: Natasha Mead M.D.   On: 03/05/2015 16:52   Mr Brain Wo Contrast  03/05/2015  CLINICAL DATA:  Expressive aphasia for 5 hours prior to admission, confusion. Assess acute stroke. History of hyperlipidemia, hypertension and diabetes. EXAM: MRI HEAD WITHOUT CONTRAST MRA HEAD WITHOUT CONTRAST TECHNIQUE: Multiplanar, multiecho pulse sequences of the brain and surrounding structures were obtained without intravenous contrast. Angiographic images of the head were obtained using MRA technique without contrast. COMPARISON:  CT head March 05, 2015 at 1635 hours and MRI head February 09, 2014 FINDINGS: MRI HEAD FINDINGS 11 x 13 mm focus of acute ischemia, LEFT internal capsule genu extending to the globus pallidus with corresponding low ADC values. No additional foci of acute ischemia. No susceptibility artifact to suggest hemorrhage. No midline shift, mass effect or mass lesions. Ventricles and sulci are normal for patient's age. Scattered subcentimeter supratentorial white matter FLAIR T2 hyperintensities are less than expected for age most compatible with chronic small vessel ischemic disease. Small inferior basal ganglia perivascular spaces. No abnormal extra-axial fluid collections. Status post bilateral ocular lens implants. Trace paranasal sinus mucosal thickening without air-fluid levels. The mastoid air cells are well aerated. No abnormal sellar expansion. No cerebellar tonsillar ectopia. No suspicious calvarial bone marrow signal. MRA HEAD FINDINGS Anterior circulation: Normal flow related enhancement of the  included cervical, petrous, cavernous and supraclinoid internal carotid arteries very mild narrowing of the bilateral supraclinoid internal carotid arteries, corresponding to calcific atherosclerosis on today's head CT. Patent anterior communicating artery. Normal flow related enhancement of the anterior and middle cerebral arteries, including distal segments. Tiny supernumerary anterior cerebral artery arises from the RIGHT A1-2 junction. Mild luminal irregularity of the mid to distal anterior and middle cerebral arteries most compatible with atherosclerosis. No large vessel occlusion, high-grade stenosis, aneurysm. Posterior circulation: LEFT vertebral artery is dominant. Basilar artery is patent, with normal flow related enhancement of the main branch vessels. Patent bilateral posterior cerebral arteries. High-grade stenosis origin RIGHT P1. Tandem high-grade stenosis bilateral P2 and P3 segments. No large vessel occlusion, abnormal luminal irregularity, aneurysm. IMPRESSION: MRI HEAD: 11 x 13 mm acute ischemia LEFT internal capsule/basal ganglia corresponding to today's CT finding. Otherwise negative MRI of the brain for age. MRA HEAD: No acute large vessel occlusion. High-grade stenoses bilateral posterior cerebral arteries. Mild luminal irregularity of the anterior and middle cerebral arteries compatible with atherosclerosis. Mild bilateral supraclinoid internal carotid artery status stenosis corresponding to calcific atherosclerosis on today's head CT. Electronically Signed   By: Awilda Metro M.D.   On: 03/05/2015 22:34   Mr [facility] Glenn Head/brain Wo Cm  03/05/2015  CLINICAL DATA:  Expressive aphasia for 5 hours prior to admission, confusion. Assess acute stroke. History of hyperlipidemia, hypertension and diabetes. EXAM: MRI HEAD WITHOUT CONTRAST MRA HEAD WITHOUT CONTRAST TECHNIQUE: Multiplanar, multiecho pulse sequences of the brain and surrounding structures were obtained without intravenous contrast.  Angiographic images of the head were obtained using MRA technique without contrast. COMPARISON:  CT head March 05, 2015 at 1635 hours and MRI head February 09, 2014 FINDINGS: MRI HEAD FINDINGS 11 x 13 mm focus of acute ischemia, LEFT internal capsule genu extending to the globus pallidus with corresponding low ADC values. No additional foci of acute ischemia. No susceptibility artifact to suggest hemorrhage. No midline shift, mass effect or mass lesions. Ventricles and sulci are  normal for patient's age. Scattered subcentimeter supratentorial white matter FLAIR T2 hyperintensities are less than expected for age most compatible with chronic small vessel ischemic disease. Small inferior basal ganglia perivascular spaces. No abnormal extra-axial fluid collections. Status post bilateral ocular lens implants. Trace paranasal sinus mucosal thickening without air-fluid levels. The mastoid air cells are well aerated. No abnormal sellar expansion. No cerebellar tonsillar ectopia. No suspicious calvarial bone marrow signal. MRA HEAD FINDINGS Anterior circulation: Normal flow related enhancement of the included cervical, petrous, cavernous and supraclinoid internal carotid arteries very mild narrowing of the bilateral supraclinoid internal carotid arteries, corresponding to calcific atherosclerosis on today's head CT. Patent anterior communicating artery. Normal flow related enhancement of the anterior and middle cerebral arteries, including distal segments. Tiny supernumerary anterior cerebral artery arises from the RIGHT A1-2 junction. Mild luminal irregularity of the mid to distal anterior and middle cerebral arteries most compatible with atherosclerosis. No large vessel occlusion, high-grade stenosis, aneurysm. Posterior circulation: LEFT vertebral artery is dominant. Basilar artery is patent, with normal flow related enhancement of the main branch vessels. Patent bilateral posterior cerebral arteries. High-grade  stenosis origin RIGHT P1. Tandem high-grade stenosis bilateral P2 and P3 segments. No large vessel occlusion, abnormal luminal irregularity, aneurysm. IMPRESSION: MRI HEAD: 11 x 13 mm acute ischemia LEFT internal capsule/basal ganglia corresponding to today's CT finding. Otherwise negative MRI of the brain for age. MRA HEAD: No acute large vessel occlusion. High-grade stenoses bilateral posterior cerebral arteries. Mild luminal irregularity of the anterior and middle cerebral arteries compatible with atherosclerosis. Mild bilateral supraclinoid internal carotid artery status stenosis corresponding to calcific atherosclerosis on today's head CT. Electronically Signed   By: Awilda Metro M.D.   On: 03/05/2015 22:34         Subjective: Patient still with word finding difficulties and dysarthria. Denies any headache, dizziness, chest pain, shortness breath, nausea, vomiting, diarrhea, bone pain. No dysuria or hematuria. No rashes.  Objective: Filed Vitals:   03/07/15 0100 03/07/15 0458 03/07/15 0913 03/07/15 1359  BP: 131/60 143/61 127/60 118/61  Pulse: 65 66 61 66  Temp: 98 F (36.7 C) 97.3 F (36.3 C) 98.3 F (36.8 C) 97.8 F (36.6 C)  TempSrc: Oral Oral Oral Oral  Resp: 18 17 18 20   Height:      Weight:      SpO2: 94% 98% 98% 99%   No intake or output data in the 24 hours ending 03/07/15 1800 Weight change:  Exam:   General:  Pt is alert, follows commands appropriately, not in acute distress  HEENT: No icterus, No thrush, No neck mass, [place]/AT  Cardiovascular: RRR, S1/S2, no rubs, no gallops  Respiratory: CTA bilaterally, no wheezing, no crackles, no rhonchi  Abdomen: Soft/+BS, non tender, non distended, no guarding; no hepatosplenomegaly  Extremities: No edema, No lymphangitis, No petechiae, No rashes, no synovitis; no cyanosis or clubbing  Data Reviewed: Basic Metabolic Panel:  Recent Labs Lab 03/05/15 1630 03/05/15 1636 03/05/15 1825 03/06/15 0936  03/07/15 0542  NA 131* 133*  --  133* 136  K 6.8* 6.5* 4.6 4.2 4.3  CL 99* 100*  --  101 102  CO2 18*  --   --  25 25  GLUCOSE 338* 345*  --  277* 197*  BUN 17 29*  --  16 15  CREATININE 0.97 0.80  --  0.86 0.84  CALCIUM 8.5*  --   --  8.5* 9.1   Liver Function Tests:  Recent Labs Lab 03/05/15 1630 03/06/15 0936  AST  81* 40  ALT 37 27  ALKPHOS 58 50  BILITOT 1.1 0.8  PROT RESULTS UNAVAILABLE DUE TO INTERFERING SUBSTANCE 5.8*  ALBUMIN 3.4* 3.0*   No results for input(s): LIPASE, AMYLASE in the last 168 hours. No results for input(s): AMMONIA in the last 168 hours. CBC:  Recent Labs Lab 03/05/15 1630 03/05/15 1636  WBC 9.6  --   NEUTROABS 5.8  --   HGB 14.2 15.3*  HCT 42.5 45.0  MCV 82.4  --   PLT 252  --    Cardiac Enzymes: No results for input(s): CKTOTAL, CKMB, CKMBINDEX, TROPONINI in the last 168 hours. BNP: Invalid input(s): POCBNP CBG:  Recent Labs Lab 03/06/15 1630 03/06/15 2231 03/07/15 0636 03/07/15 1153 03/07/15 1650  GLUCAP 169* 212* 225* 153* 224*    Recent Results (from the past 240 hour(s))  Culture, Urine     Status: None (Preliminary result)   Collection Time: 03/06/15  2:27 PM  Result Value Ref Range Status   Specimen Description URINE, RANDOM  Final   Special Requests NONE  Final   Culture CULTURE REINCUBATED FOR BETTER GROWTH  Final   Report Status PENDING  Incomplete     Scheduled Meds: . aspirin  300 mg Rectal Daily   Or  . aspirin  325 mg Oral Daily  . atorvastatin  20 mg Oral q1800  . heparin  5,000 Units Subcutaneous 3 times per day  . insulin aspart  0-15 Units Subcutaneous TID WC  . insulin aspart  0-5 Units Subcutaneous QHS  . [START ON 03/08/2015] insulin aspart  4 Units Subcutaneous TID WC  . insulin detemir  14 Units Subcutaneous BID  . levothyroxine  100 mcg Oral QAC breakfast  . pantoprazole  40 mg Oral Daily  . propranolol ER  80 mg Oral Daily   Continuous Infusions:    Bartlett Enke, DO  [facility] Pager [phone]  If 7PM-7AM, please contact night-coverage [email or web address] Password [email or web address] 03/07/2015, 6:00 PM   LOS: 2 days

## 2015-03-07 NOTE — Progress Notes (Signed)
  Echocardiogram 2D Echocardiogram has been performed.  Diamond Nickel 03/07/2015, 11:55 AM

## 2015-03-08 LAB — GLUCOSE, CAPILLARY
GLUCOSE-CAPILLARY: 197 mg/dL — AB (ref 65–99)
GLUCOSE-CAPILLARY: 158 mg/dL — AB (ref 65–99)
GLUCOSE-CAPILLARY: 155 mg/dL — AB (ref 65–99)
Glucose-Capillary: 152 mg/dL — ABNORMAL HIGH (ref 65–99)

## 2015-03-08 MED ORDER — CEFUROXIME AXETIL 250 MG PO TABS: 250.0000 mg | ORAL_TABLET | Freq: Two times a day (BID) | ORAL | Status: DC

## 2015-03-08 MED ORDER — INSULIN GLARGINE 100 UNIT/ML SOLOSTAR PEN: 30.0000 [IU] | PEN_INJECTOR | Freq: Every day | SUBCUTANEOUS | Status: DC

## 2015-03-08 MED ORDER — INSULIN PEN NEEDLE 32G X 4 MM MISC: Status: DC

## 2015-03-08 MED ORDER — INSULIN STARTER KIT- PEN NEEDLES (ENGLISH): 1.0000 | Freq: Once | Status: DC

## 2015-03-08 MED ORDER — INSULIN STARTER KIT- PEN NEEDLES (ENGLISH)
1.0000 | Freq: Once | Status: AC
  Administered 2015-03-08: 1
  Filled 2015-03-08: qty 1

## 2015-03-08 NOTE — Progress Notes (Signed)
RN tried to educate patient on diabetes, but patient seemed not to be understanding what was been taught. Will repeat.

## 2015-03-08 NOTE — Progress Notes (Signed)
Patient's sister said can't take the patient home. MD notified.

## 2015-03-08 NOTE — Progress Notes (Signed)
Inpatient Diabetes Program Recommendations  AACE/ADA: New Consensus Statement on Inpatient Glycemic Control (2015)  Target Ranges:  Prepandial:   less than 140 mg/dL      Peak postprandial:   less than 180 mg/dL (1-2 hours)      Critically ill patients:  140 - 180 mg/dL   Review of Glycemic Control:  Results for MALAYAH, DUSCH (MRN [id number]) as of 03/08/2015 14:58  Ref. Range 03/07/2015 16:50 03/07/2015 21:51 03/08/2015 06:46 03/08/2015 11:12  Glucose-Capillary Latest Ref Range: 65-99 mg/dL 224 (H) 199 (H) 197 (H) 152 (H)    Diabetes history: Type 2 diabetes Current orders for Inpatient glycemic control: Levemir 14 units bid, Novolog moderate tid with meals and HS  Inpatient Diabetes Program Recommendations:    Note that patient is new to insulin.  She is supposed to go home with assistance from her daughter however daughter is currently in ER.  Sister is at bedside.  Patient was sleeping when I went in.  She woke up and I attempted to discuss diabetes and insulin, however she states that the diabetes videos she watched were about "guitar strings".  Nurse states she tried to let pt. Self administer insulin at lunch however she was unable.  Patient seems to need lots of support and will likely need a family member to assist with insulin administration and blood sugar checking.  Discussed with case management.  Will follow.  Thanks, Adah Perl, RN, BC-ADM Inpatient Diabetes Coordinator Pager [phone] (8a-5p)

## 2015-03-08 NOTE — Progress Notes (Signed)
Patient educated on diabetes and education channel on diabetes shown to the patient.

## 2015-03-08 NOTE — Progress Notes (Signed)
MD called and wanted RN to talk to the social worker about the situation with the patient. Social called and she will get back with RN.

## 2015-03-08 NOTE — Progress Notes (Signed)
  RD consulted for nutrition education regarding diabetes.   Lab Results  Component Value Date   HGBA1C 11.0* 03/06/2015    RD met with patient to discussed diet related to blood glucose control. Pt denies any nutrition education needs. She reports taking insulin 3 times daily. Reviewed patient's diet recall. She reports eating low carb vegetables at each meal. She may eat potatoes, vegetables, and chicken and if she wants to eat more, she will eat a second portion of chicken. She demonstrates good knowledge of which foods have carbohydrates and which foods does not. Meals appear balanced. She denies use of sugar-sweetened beverages and reports drinking diet only.  RD emphasized the importance of taking insulin. Reinforced patient's good habits of eating protein and vegetables with meals and avoiding sugar-sweetened beverages. Reviewed portion sizes of sweets/desserts and emphasized the importance of limiting amount per meal.  Pt was not interested in any additional diet education and declines handouts at this time.   Teach back method used.  Expect fair compliance.  Body mass index is 34.75 kg/(m^2). Pt meets criteria for Obesity based on current BMI.  Current diet order is Carb Modified, patient is consuming approximately 100% of meals at this time. Labs and medications reviewed. No further nutrition interventions warranted at this time. RD contact information provided. If additional nutrition issues arise, please re-consult RD.  Scarlette Ar RD, LDN Inpatient Clinical Dietitian Pager: [phone] After Hours Pager: [phone]

## 2015-03-08 NOTE — Progress Notes (Signed)
Physical Therapy Treatment Patient Details Name: Leah Levine MRN: [id number] DOB: 06/22/1932 Today's Date: 03/08/2015    History of Present Illness Leah Levine is an 81 y.o. female who was in her normal state of health this am until around noon at which point she began having slurred speech and saying things that were not correct. She denies numbness or weakness. She was able to communicate fairly well, but when showed pctures, she has marked difficulty with naming of objects. PMH: tremor, B TKA, B CTR, DM2, HTN, CAD, back surgery, c-spine fusion, facial reconstruction with one eyebrow permanently raised and eyelid left.    PT Comments    Pt making progress with mobility functionally though still requires significant cues for safety due to impairments noted below. Pt overall min assist using RW with mod verbal cues for attention, safety, awareness, and sequencing. Daughter is no longer able to provide assistance at d/c due to being in the hospital herself, and there does not appear to be anyone else who can provide 24/7 supervision. Recommending d/c plan change to short term SNF at this time due to pt requiring 24/7 supervision assist. Pt will benefit from continued skilled PT services to address impairments and increase functional independence and safety with mobility.     Follow Up Recommendations  SNF;Supervision/Assistance - 24 hour (Daughter no longer able to provide assitance at d/c)     Equipment Recommendations  None recommended by PT    Recommendations for Other Services       Precautions / Restrictions Precautions Precautions: Fall Restrictions Weight Bearing Restrictions: No    Mobility  Bed Mobility Overal bed mobility: Needs Assistance Bed Mobility: Supine to Sit;Sit to Supine     Supine to sit: Min assist Sit to supine: Min assist   General bed mobility comments: requires cues for sequencing and min assist to scoot to edge of bed before  attempting to stand up. required assist to get LE back onto the bed second time returning to supine. (Assist pt in and out of bed for mobility and then once positioned pt reported she needed to use the bathroom so assisted patient a second time)  Transfers Overall transfer level: Needs assistance Equipment used: Rolling walker (2 wheeled) Transfers: Sit to/from [facility] Sit to Stand: Min assist Stand pivot transfers: Min assist       General transfer comment: Min assist for balance and safe use of RW with verbal cues needed. Performed basic transfers in/out of bed as well as on and off elevated toilet seat.  Ambulation/Gait Ambulation/Gait assistance: Min assist Ambulation Distance (Feet): 120 Feet Assistive device: Rolling walker (2 wheeled) Gait Pattern/deviations: Step-to pattern;Decreased step length - right;Trunk flexed     General Gait Details: Pt with decreased awareness to obstacles on L side and required verbal cues as well as tactile cueing with Rw to get on straight path.  Cues for keeping body inside of RW.   Stairs            Wheelchair Mobility    Modified Rankin (Stroke Patients Only)       Balance Overall balance assessment: Needs assistance Sitting-balance support: Bilateral upper extremity supported;Feet supported;Single extremity supported Sitting balance-Leahy Scale: Fair Sitting balance - Comments: required min assist initially to stabilize   Standing balance support: Bilateral upper extremity supported;During functional activity;Single extremity supported Standing balance-Leahy Scale: Poor Standing balance comment: assessed during hygiene with toileting as well as functionally during gait and transfers  Cognition Arousal/Alertness: Awake/alert Behavior During Therapy: Impulsive Overall Cognitive Status: Impaired/Different from baseline Area of Impairment: Memory;Following  commands;Safety/judgement;Awareness;Problem solving;Attention   Current Attention Level: Sustained Memory: Decreased short-term memory Following Commands: Follows one step commands consistently;Follows multi-step commands inconsistently Safety/Judgement: Decreased awareness of deficits;Decreased awareness of safety Awareness: Emergent Problem Solving: Difficulty sequencing;Requires verbal cues;Requires tactile cues General Comments: mod verbal cues for safety with mobility, safe use of RW, attentinon to obstacles on the L when going down the hallway, and overall memory of cues during session    Exercises      General Comments General comments (skin integrity, edema, etc.): Pt with nonsensical words at times.       Pertinent Vitals/Pain Pain Assessment: No/denies pain    Home Living                      Prior Function            PT Goals (current goals can now be found in the care plan section) Acute Rehab PT Goals Patient Stated Goal: none stated PT Goal Formulation: With patient Time For Goal Achievement: 03/20/15 Potential to Achieve Goals: Good Progress towards PT goals: Progressing toward goals    Frequency  Min 4X/week    PT Plan Discharge plan needs to be updated    Co-evaluation             End of Session   Activity Tolerance: Patient tolerated treatment well Patient left: in bed;with call bell/phone within reach;with bed alarm set;with family/visitor present     Time: 1440-1501 PT Time Calculation (min) (ACUTE ONLY): 21 min  Charges:  $Therapeutic Activity: 8-22 mins                    G Codes:      Juanna Cao, PT, DPT Pager #: [phone]  03/08/2015, 3:13 PM

## 2015-03-08 NOTE — Progress Notes (Signed)
Speech Language Pathology Treatment: Cognitive-Linquistic  Patient Details Name: Leah Levine MRN: [id number] DOB: 04-21-1932 Today's Date: 03/08/2015 Time: [id number] SLP Time Calculation (min) (ACUTE ONLY): 20 min  Assessment / Plan / Recommendation Clinical Impression  Skilled treatment session focused on addressing aphasia goals.  SLP facilitated session with structured confrontational and reponsive naming tasks and Min-Mod phonemic cues for accuracy.  Patient demonstrated increased awareness of errors today and was able to utilize description to self cues with Max assist multimodal cues.  Sister present and advocating for patient to get more therapy before returning home, along with daughter no longer being able to provide 24/7.  SLP now recommending SNF.    HPI HPI: Leah Levine is an 81 y.o. female who was admitted with slurred speech and aphasia.  PMH: tremor, B TKA, B CTR, DM2, HTN, CAD, back surgery, c-spine fusion, facial reconstruction with one eyebrow permanently raised and eyelid left.      SLP Plan  Continue with current plan of care     Recommendations                 Follow up Recommendations: SNF, 24/7 Supervision  Plan: Continue with current plan of care  Carmelia Roller., CCC-SLP [place] 03/08/2015, 4:33 PM

## 2015-03-09 ENCOUNTER — Telehealth: Payer: Self-pay | Admitting: Nurse Practitioner

## 2015-03-09 DIAGNOSIS — E1149 Type 2 diabetes mellitus with other diabetic neurological complication: Secondary | ICD-10-CM

## 2015-03-09 DIAGNOSIS — E118 Type 2 diabetes mellitus with unspecified complications: Secondary | ICD-10-CM

## 2015-03-09 DIAGNOSIS — Z794 Long term (current) use of insulin: Secondary | ICD-10-CM

## 2015-03-09 LAB — URINE CULTURE: Culture: >=100000

## 2015-03-09 LAB — GLUCOSE, CAPILLARY
GLUCOSE-CAPILLARY: 228 mg/dL — AB (ref 65–99)
Glucose-Capillary: 158 mg/dL — ABNORMAL HIGH (ref 65–99)

## 2015-03-09 NOTE — NC FL2 (Signed)
[place] LEVEL OF CARE SCREENING TOOL     IDENTIFICATION  Patient Name: Leah Levine Birthdate: 1932/12/04 Sex: female Admission Date (Current Location): 03/05/2015  [facility] and [place] Number: [place] and Address:  [facility] [place], [place] [address] [place], [place] [address]      Provider Number: [id number]  Attending Physician Name and Address:  Debbe Odea, MD  Relative Name and Phone Number:       Current Level of Care: Hospital Recommended Level of Care: [place] Prior Approval Number:    Date Approved/Denied:   PASRR Number:    Discharge Plan: SNF    Current Diagnoses: Patient Active Problem List   Diagnosis Date Noted  . Stroke (cerebrum) ([place]) 03/05/2015  . Expressive aphasia 03/05/2015  . Acute CVA (cerebrovascular accident) ([name]) 03/05/2015  . Syncope 12/09/2013  . CAD (coronary artery disease) 09/18/2013  . Hypothyroidism 06/18/2013  . S/P CABG x 4 12/01/2012  . Vitamin B12 deficiency 12/05/2010  . Personal history of colonic polyps 12/05/2010  . Esophageal dysphagia 11/20/2010  . Benign neoplasm of colon 11/20/2010  . Gastritis, chronic 11/20/2010  . Allergic rhinitis 07/03/2010  . Tremor   . GERD (gastroesophageal reflux disease)   . Hyperlipidemia   . Hypertension   . Urinary incontinence   . Diabetes mellitus type 2, uncontrolled ([place])     Orientation RESPIRATION BLADDER Height & Weight    Self, Time, Situation, Place  Normal Continent 5' (152.4 cm) 177 lbs.  BEHAVIORAL SYMPTOMS/MOOD NEUROLOGICAL BOWEL NUTRITION STATUS   (NONE)  (NONE) Continent Diet (CARB MODIFIED)  AMBULATORY STATUS COMMUNICATION OF NEEDS Skin   Limited Assist Verbally Normal                       Personal Care Assistance Level of Assistance  Bathing, Dressing Bathing Assistance: Limited assistance   Dressing Assistance: Limited assistance     Functional Limitations Info   (NONE)           [place]  PT (By licensed PT), OT (By licensed OT)     PT Frequency: 4 OT Frequency: 3            Contractures      Additional Factors Info  Code Status, Allergies Code Status Info: FULL CODE  Allergies Info: Celebrex, Fenofibrate, Statins           Current Medications (03/09/2015):  This is the current hospital active medication list Current Facility-Administered Medications  Medication Dose Route Frequency Provider Last Rate Last Dose  . acetaminophen (TYLENOL) tablet 650 mg  650 mg Oral Q6H PRN David L Rinehuls, PA-C   650 mg at 03/07/15 1525  . aspirin suppository 300 mg  300 mg Rectal Daily Charlynne Cousins, MD       Or  . aspirin tablet 325 mg  325 mg Oral Daily Charlynne Cousins, MD   325 mg at 03/08/15 1017  . atorvastatin (LIPITOR) tablet 20 mg  20 mg Oral q1800 Orson Eva, MD   20 mg at 03/08/15 1717  . heparin injection 5,000 Units  5,000 Units Subcutaneous 3 times per day Charlynne Cousins, MD   5,000 Units at 03/09/15 [id number]  . insulin aspart (novoLOG) injection 0-15 Units  0-15 Units Subcutaneous TID WC Charlynne Cousins, MD   3 Units at 03/08/15 1718  . insulin aspart (novoLOG) injection 0-5 Units  0-5 Units Subcutaneous QHS Bess Harvest  Olevia Bowens, MD   2 Units at 03/06/15 2255  . insulin aspart (novoLOG) injection 4 Units  4 Units Subcutaneous TID WC Orson Eva, MD   4 Units at 03/08/15 1718  . insulin detemir (LEVEMIR) injection 14 Units  14 Units Subcutaneous BID Orson Eva, MD   14 Units at 03/08/15 2125  . levothyroxine (SYNTHROID, LEVOTHROID) tablet 100 mcg  100 mcg Oral QAC breakfast Charlynne Cousins, MD   100 mcg at 03/08/15 0805  . pantoprazole (PROTONIX) EC tablet 40 mg  40 mg Oral Daily Charlynne Cousins, MD   40 mg at 03/08/15 1017  . propranolol ER (INDERAL LA) 24 hr capsule 80 mg  80 mg Oral Daily Orson Eva, MD   80 mg at 03/08/15 1017  . senna-docusate (Senokot-S) tablet 1 tablet  1 tablet Oral QHS PRN  Charlynne Cousins, MD         Discharge Medications: Please see discharge summary for a list of discharge medications.  Relevant Imaging Results:  Relevant Lab Results:   Additional Information SSN [id number]  Glendon Axe, MSW, LCSWA [phone] 03/09/2015 7:31 AM

## 2015-03-09 NOTE — Progress Notes (Signed)
Occupational Therapy Treatment Patient Details Name: Leah Levine MRN: [id number] DOB: 1932/07/07 Today's Date: 03/09/2015    History of present illness Leah Levine is an 81 y.o. female who was in her normal state of health this am until around noon at which point she began having slurred speech and saying things that were not correct. She denies numbness or weakness. She was able to communicate fairly well, but when showed pctures, she has marked difficulty with naming of objects. PMH: tremor, B TKA, B CTR, DM2, HTN, CAD, back surgery, c-spine fusion, facial reconstruction with one eyebrow permanently raised and eyelid left.   OT comments  Pt progressing toward OT goals. Pt required min guard assist for safety due to some impulsivity and balance deficits. Pt had 1 LOB during session requiring mod assist to regain balance - discussed fall prevention strategies and RW safety with pt and daughter. Continue to recommend [place] for ADL retraining and fall risk evaluation.    Follow Up Recommendations  Home health OT;Supervision/Assistance - 24 hour    Equipment Recommendations  None recommended by OT    Recommendations for Other Services      Precautions / Restrictions Precautions Precautions: Fall Restrictions Weight Bearing Restrictions: No       Mobility Bed Mobility Overal bed mobility: Needs Assistance Bed Mobility: Supine to Sit     Supine to sit: Min assist     General bed mobility comments: Pt sitting EOB on arrival  Transfers Overall transfer level: Needs assistance Equipment used: Rolling walker (2 wheeled) Transfers: Sit to/from Stand Sit to Stand: Min guard         General transfer comment: Min guard assist for balance and to avoid obstacles on L side.     Balance Overall balance assessment: Needs assistance Sitting-balance support: No upper extremity supported;Feet supported Sitting balance-Leahy Scale: Fair     Standing balance  support: Bilateral upper extremity supported;During functional activity Standing balance-Leahy Scale: Fair Standing balance comment: 1 LOB to R side.                    ADL Overall ADL's : Needs assistance/impaired                 Upper Body Dressing : Minimal assistance;With caregiver independent assisting;Sitting   Lower Body Dressing: Moderate assistance;With caregiver independent assisting;Sit to/from stand   Toilet Transfer: Min guard;Ambulation;BSC;RW;Cueing for safety   Toileting- Clothing Manipulation and Hygiene: Min guard;Cueing for safety;Sit to/from stand       Functional mobility during ADLs: Min guard;Cueing for safety;Cueing for sequencing;Rolling walker General ADL Comments: Pt still impulsive. Min guard assist for transfers and ambulation. 1 LOB to R side when attempting to pick up socks and carry them while ambulating to EOB w/ mod assist to regain balance - educated pt to avoid doing this at home. Verbal cues throughout to avoid obtacles on L side and to use RW safely. Pt required min-mod assist from daughter for dressing and daughter reports she will be able to continue providing this level of care 24/7. Discussed with daughter to keep important items on pt's R side, but continue to  encouraging pt to attend to L side.      Vision                     Perception     Praxis      Cognition   Behavior During Therapy: Impulsive Overall Cognitive Status: Impaired/Different from baseline Area  of Impairment: Attention;Memory;Following commands;Safety/judgement;Awareness;Problem solving   Current Attention Level: Sustained Memory: Decreased short-term memory  Following Commands: Follows one step commands consistently;Follows multi-step commands inconsistently Safety/Judgement: Decreased awareness of deficits;Decreased awareness of safety Awareness: Emergent Problem Solving: Difficulty sequencing;Requires verbal cues;Requires tactile  cues General Comments: Verbal cues to stay focused on task at hand. Verbal cues for safety and mobility with RW and to attend to L side obtacles    Extremity/Trunk Assessment               Exercises General Exercises - Lower Extremity Long Arc Quad: AROM;Strengthening;Both;20 reps;Seated Hip Flexion/Marching: AROM;Strengthening;20 reps;Both;Seated Toe Raises: AROM;Strengthening;Both;20 reps;Seated Heel Raises: AROM;Strengthening;20 reps;Both;Seated Other Exercises Other Exercises: Educated family on HEP for pt to be able to do when sitting up in chair during the day   Shoulder Instructions       General Comments      Pertinent Vitals/ Pain       Pain Assessment: No/denies pain  Home Living                                          Prior Functioning/Environment              Frequency Min 3X/week     Progress Toward Goals  OT Goals(current goals can now be found in the care plan section)  Progress towards OT goals: Progressing toward goals  Acute Rehab OT Goals Patient Stated Goal: to go home OT Goal Formulation: With patient Time For Goal Achievement: 03/21/15 Potential to Achieve Goals: Good ADL Goals Pt Will Perform Lower Body Dressing: with modified independence;sitting/lateral leans;sit to/from stand Pt Will Transfer to Toilet: with supervision;ambulating;bedside commode Pt Will Perform Toileting - Clothing Manipulation and hygiene: with supervision;sitting/lateral leans;sit to/from stand Additional ADL Goal #1: Pt will accurately identify 2/3 objects on L side when ambulating to increase safety with ADLs.  Plan Discharge plan remains appropriate    Co-evaluation                 End of Session Equipment Utilized During Treatment: Gait belt;Rolling walker   Activity Tolerance Patient tolerated treatment well   Patient Left in bed;with call bell/phone within reach;with family/visitor present;with nursing/sitter in room    Nurse Communication Mobility status        Time: 1359-1415 OT Time Calculation (min): 16 min  Charges: OT General Charges $OT Visit: 1 Procedure OT Treatments $Self Care/Home Management : 8-22 mins  Redmond Baseman, OTR/L 03/09/2015, 2:37 PM

## 2015-03-09 NOTE — Telephone Encounter (Signed)
I did not see this patient, nor any patient, in the hospital this morning. It does not appear this patient is on our service nor is she is a patient of the [facility]. Please call sister and advise I think she has reached the wrong clinic.  Leeanne Rio, MD

## 2015-03-09 NOTE — Care Management Note (Addendum)
Case Management Note  Patient Details  Name: Jersie Tosto MRN: [id number] Date of Birth: 1932/11/19  Subjective/Objective:                    Action/Plan: Plan is for patient to discharge home today with [facility] home health. Daughter states between her and her daughter in law the patient will have 24 hour care. CM asked the daughter about checking the patients sugars and giving insulin at home and she states she is comfortable with this. Daughter states she has given insulin to other family members in the past. Bedside RN to observe daughter giving insulin at next med pass. Bedside RN updated on plan.   Expected Discharge Date:  03/07/15               Expected Discharge Plan:  [place]  In-House Referral:     Discharge planning Services  CM Consult  Post Acute Care Choice:  Home Health Choice offered to:  Patient, Adult Children  DME Arranged:    DME Agency:     HH Arranged:  PT, OT, Social Work [place] Agency:  [place]  Status of Service:  In process, will continue to follow  Medicare Important Message Given:    Date Medicare IM Given:    Medicare IM give by:    Date Additional Medicare IM Given:    Additional Medicare Important Message give by:     If discussed at [place] of Stay Meetings, dates discussed:    Additional Comments:  Pollie Friar, RN 03/09/2015, 10:45 AM

## 2015-03-09 NOTE — Telephone Encounter (Signed)
Left below message on patient voicemail.

## 2015-03-09 NOTE — Progress Notes (Signed)
PROGRESS NOTE  Leah Levine OAC:[id number] DOB: 10/06/1932 DOA: 03/05/2015 PCP: Bennie Pierini, FNP  Brief History 81 year old female with history of coronary artery disease with history of CABG, diabetes mellitus, hypertension, hyperlipidemia presented with dysphasia that started on the morning of admission. Currently, the patient was out to lunch with her sisters when she had word finding difficulties. In addition, the patient has had some lower extremity weakness which has been chronic. Patient denied any fevers, chills, chest discomfort, shortness breath, visual disturbance, focal extremity weakness. Evaluation in the emergency department revealed hyperkalemia with potassium 6.4, hyponatremia with sodium 131. CT of the brain revealed an area of decreased attenuation in left basal ganglia. MRI of the brain revealed acute ischemia left internal capsule. Neurology was consulted. A full neurologic workup was undertaken. Ultimately, the patient was cleared for discharge. Unfortunately, on the day of discharge, the patient's daughter with whom the patient was to go home and had a syncopal episode and went to the emergency department. Plans were temporarily made for the patient to go to skilled nursing facility. However the patient's daughter recovered medically, and the patient will ultimately go home with the patient's daughter. Home health physical therapy, OT, and social work were set up. The patient was ultimately discharged on 03/09/2015  Assessment/Plan:  Acute nonhemorrhagic stroke -03/05/2015 MR brain--acute ischemia left internal capsule/basal ganglia -MRA brain negative for large vessel occlusion -Hemoglobin A1c--11.0 -LDL 97 -PT/OT--> home health PT and OT -home health ST -Carotid duplex--negative for hemodynamically significant lesions -Echo--EF 55-60%, grade 1 DD -Continue aspirin Diabetes mellitus type 2, uncontrolled -11/11/2014 hemoglobin A1c  13.0 -03/06/2015 hemoglobin A1c--11.0 -Hold glipizide and Jaunmet -After discussion with the patient and her daughter, pt agrees to take insulin at home -increase levemir to 14 units bid -novolog 4 units with meals -home with lantus 30 units at bedtime to start am 03/10/15 -novolog sliding scale during the hospitalization Hypertension -continue Inderal once daily Hyperlipidemia -start statin Gait instability -PT/OT evaluation--home health PT/OT CAD with hx of CABG -continue ASA -continue BB--decrease Inderal to 80 mg once daily -no anginal symptoms Hyperkalemia -improved Hyponatremia -improved Pyuria/UTI -add urine culture-->klebsiella -home with cefuroxime x 5 days Family Communication: Family update at beside Disposition Plan: Home 03/09/15     Procedures/Studies: Dg Chest 2 View  03/05/2015  CLINICAL DATA:  Acute CVA.  Initial encounter. EXAM: CHEST  2 VIEW COMPARISON:  Chest radiograph performed 12/09/2013 FINDINGS: The lungs are well-aerated. Mild vascular congestion is noted. There is no evidence of focal opacification, pleural effusion or pneumothorax. A calcified granuloma is again noted at the left midlung zone. The heart is borderline normal in size. The patient is status post median sternotomy, with evidence of prior CABG. A loop recorder is noted. No acute osseous abnormalities are seen. Anterior bridging osteophytes are noted along the thoracic spine. IMPRESSION: Mild vascular congestion noted.  Lungs remain grossly clear. Electronically Signed   By: Roanna Raider M.D.   On: 03/05/2015 21:17   Ct Head Wo Contrast  03/05/2015  CLINICAL DATA:  Slurred speech, confusion, code stroke EXAM: CT HEAD WITHOUT CONTRAST TECHNIQUE: Contiguous axial images were obtained from the base of the skull through the vertex without intravenous contrast. COMPARISON:  11/25/2012 FINDINGS: No skull fracture is noted. No intracranial hemorrhage, mass effect or midline shift. Paranasal  sinuses and mastoid air cells are unremarkable. Mild atherosclerotic calcifications of carotid siphon. Mild cerebral atrophy. Mild periventricular white matter decreased attenuation probable due to  chronic small vessel ischemic changes. No definite acute cortical infarction. No mass lesion is noted on this unenhanced scan. There is small area of decreased attenuation in left basal ganglia just posterior to caudate nucleus measures about 9 mm. This is probable due to ischemia of indeterminate age. This was not present from prior exam. IMPRESSION: No definite acute cortical infarction. Interval small area of decreased attenuation left basal ganglia just posterior to caudate nucleus measures 9 mm. Probable due to ischemia indeterminate age. Clinical correlation is necessary. Please see axial image 13. Mild cerebral atrophy. Mild periventricular white matter decreased attenuation probable due to chronic small vessel ischemic changes. These results were called by telephone at the time of interpretation on 03/05/2015 at 4:52 pm to Dr. Amada Jupiter, who verbally acknowledged these results. Electronically Signed   By: Natasha Mead M.D.   On: 03/05/2015 16:52   Mr Brain Wo Contrast  03/05/2015  CLINICAL DATA:  Expressive aphasia for 5 hours prior to admission, confusion. Assess acute stroke. History of hyperlipidemia, hypertension and diabetes. EXAM: MRI HEAD WITHOUT CONTRAST MRA HEAD WITHOUT CONTRAST TECHNIQUE: Multiplanar, multiecho pulse sequences of the brain and surrounding structures were obtained without intravenous contrast. Angiographic images of the head were obtained using MRA technique without contrast. COMPARISON:  CT head March 05, 2015 at 1635 hours and MRI head February 09, 2014 FINDINGS: MRI HEAD FINDINGS 11 x 13 mm focus of acute ischemia, LEFT internal capsule genu extending to the globus pallidus with corresponding low ADC values. No additional foci of acute ischemia. No susceptibility artifact to  suggest hemorrhage. No midline shift, mass effect or mass lesions. Ventricles and sulci are normal for patient's age. Scattered subcentimeter supratentorial white matter FLAIR T2 hyperintensities are less than expected for age most compatible with chronic small vessel ischemic disease. Small inferior basal ganglia perivascular spaces. No abnormal extra-axial fluid collections. Status post bilateral ocular lens implants. Trace paranasal sinus mucosal thickening without air-fluid levels. The mastoid air cells are well aerated. No abnormal sellar expansion. No cerebellar tonsillar ectopia. No suspicious calvarial bone marrow signal. MRA HEAD FINDINGS Anterior circulation: Normal flow related enhancement of the included cervical, petrous, cavernous and supraclinoid internal carotid arteries very mild narrowing of the bilateral supraclinoid internal carotid arteries, corresponding to calcific atherosclerosis on today's head CT. Patent anterior communicating artery. Normal flow related enhancement of the anterior and middle cerebral arteries, including distal segments. Tiny supernumerary anterior cerebral artery arises from the RIGHT A1-2 junction. Mild luminal irregularity of the mid to distal anterior and middle cerebral arteries most compatible with atherosclerosis. No large vessel occlusion, high-grade stenosis, aneurysm. Posterior circulation: LEFT vertebral artery is dominant. Basilar artery is patent, with normal flow related enhancement of the main branch vessels. Patent bilateral posterior cerebral arteries. High-grade stenosis origin RIGHT P1. Tandem high-grade stenosis bilateral P2 and P3 segments. No large vessel occlusion, abnormal luminal irregularity, aneurysm. IMPRESSION: MRI HEAD: 11 x 13 mm acute ischemia LEFT internal capsule/basal ganglia corresponding to today's CT finding. Otherwise negative MRI of the brain for age. MRA HEAD: No acute large vessel occlusion. High-grade stenoses bilateral posterior  cerebral arteries. Mild luminal irregularity of the anterior and middle cerebral arteries compatible with atherosclerosis. Mild bilateral supraclinoid internal carotid artery status stenosis corresponding to calcific atherosclerosis on today's head CT. Electronically Signed   By: Awilda Metro M.D.   On: 03/05/2015 22:34   Mr Machaela Glenn Head/brain Wo Cm  03/05/2015  CLINICAL DATA:  Expressive aphasia for 5 hours prior to admission, confusion. Assess acute stroke.  History of hyperlipidemia, hypertension and diabetes. EXAM: MRI HEAD WITHOUT CONTRAST MRA HEAD WITHOUT CONTRAST TECHNIQUE: Multiplanar, multiecho pulse sequences of the brain and surrounding structures were obtained without intravenous contrast. Angiographic images of the head were obtained using MRA technique without contrast. COMPARISON:  CT head March 05, 2015 at 1635 hours and MRI head February 09, 2014 FINDINGS: MRI HEAD FINDINGS 11 x 13 mm focus of acute ischemia, LEFT internal capsule genu extending to the globus pallidus with corresponding low ADC values. No additional foci of acute ischemia. No susceptibility artifact to suggest hemorrhage. No midline shift, mass effect or mass lesions. Ventricles and sulci are normal for patient's age. Scattered subcentimeter supratentorial white matter FLAIR T2 hyperintensities are less than expected for age most compatible with chronic small vessel ischemic disease. Small inferior basal ganglia perivascular spaces. No abnormal extra-axial fluid collections. Status post bilateral ocular lens implants. Trace paranasal sinus mucosal thickening without air-fluid levels. The mastoid air cells are well aerated. No abnormal sellar expansion. No cerebellar tonsillar ectopia. No suspicious calvarial bone marrow signal. MRA HEAD FINDINGS Anterior circulation: Normal flow related enhancement of the included cervical, petrous, cavernous and supraclinoid internal carotid arteries very mild narrowing of the bilateral  supraclinoid internal carotid arteries, corresponding to calcific atherosclerosis on today's head CT. Patent anterior communicating artery. Normal flow related enhancement of the anterior and middle cerebral arteries, including distal segments. Tiny supernumerary anterior cerebral artery arises from the RIGHT A1-2 junction. Mild luminal irregularity of the mid to distal anterior and middle cerebral arteries most compatible with atherosclerosis. No large vessel occlusion, high-grade stenosis, aneurysm. Posterior circulation: LEFT vertebral artery is dominant. Basilar artery is patent, with normal flow related enhancement of the main branch vessels. Patent bilateral posterior cerebral arteries. High-grade stenosis origin RIGHT P1. Tandem high-grade stenosis bilateral P2 and P3 segments. No large vessel occlusion, abnormal luminal irregularity, aneurysm. IMPRESSION: MRI HEAD: 11 x 13 mm acute ischemia LEFT internal capsule/basal ganglia corresponding to today's CT finding. Otherwise negative MRI of the brain for age. MRA HEAD: No acute large vessel occlusion. High-grade stenoses bilateral posterior cerebral arteries. Mild luminal irregularity of the anterior and middle cerebral arteries compatible with atherosclerosis. Mild bilateral supraclinoid internal carotid artery status stenosis corresponding to calcific atherosclerosis on today's head CT. Electronically Signed   By: Awilda Metro M.D.   On: 03/05/2015 22:34        Subjective: Patient denies fevers, chills, headache, chest pain, dyspnea, nausea, vomiting, diarrhea, abdominal pain, dysuria, hematuria   Objective: Filed Vitals:   03/08/15 2231 03/09/15 0244 03/09/15 0604 03/09/15 0918  BP: 134/61 123/78 107/50 120/61  Pulse: 69 64 71 64  Temp: 98.5 F (36.9 C) 98 F (36.7 C) 97.9 F (36.6 C) 98.4 F (36.9 C)  TempSrc: Oral Oral Oral Oral  Resp: 18 18 18 18   Height:      Weight:      SpO2: 97% 94% 94% 96%   No intake or output data in  the 24 hours ending 03/09/15 1212 Weight change:  Exam:   General:  Pt is alert, follows commands appropriately, not in acute distress  HEENT: No icterus, No thrush, No neck mass, [place]/AT  Cardiovascular: RRR, S1/S2, no rubs, no gallops  Respiratory: CTA bilaterally, no wheezing, no crackles, no rhonchi  Abdomen: Soft/+BS, non tender, non distended, no guarding  Extremities: trace LE edema, No lymphangitis, No petechiae, No rashes, no synovitis  Data Reviewed: Basic Metabolic Panel:  Recent Labs Lab 03/05/15 1630 03/05/15 1636 03/05/15 1825 03/06/15  2595 03/07/15 0542  NA 131* 133*  --  133* 136  K 6.8* 6.5* 4.6 4.2 4.3  CL 99* 100*  --  101 102  CO2 18*  --   --  25 25  GLUCOSE 338* 345*  --  277* 197*  BUN 17 29*  --  16 15  CREATININE 0.97 0.80  --  0.86 0.84  CALCIUM 8.5*  --   --  8.5* 9.1   Liver Function Tests:  Recent Labs Lab 03/05/15 1630 03/06/15 0936  AST 81* 40  ALT 37 27  ALKPHOS 58 50  BILITOT 1.1 0.8  PROT RESULTS UNAVAILABLE DUE TO INTERFERING SUBSTANCE 5.8*  ALBUMIN 3.4* 3.0*   No results for input(s): LIPASE, AMYLASE in the last 168 hours. No results for input(s): AMMONIA in the last 168 hours. CBC:  Recent Labs Lab 03/05/15 1630 03/05/15 1636  WBC 9.6  --   NEUTROABS 5.8  --   HGB 14.2 15.3*  HCT 42.5 45.0  MCV 82.4  --   PLT 252  --    Cardiac Enzymes: No results for input(s): CKTOTAL, CKMB, CKMBINDEX, TROPONINI in the last 168 hours. BNP: Invalid input(s): POCBNP CBG:  Recent Labs Lab 03/08/15 1112 03/08/15 1642 03/08/15 2123 03/09/15 0638 03/09/15 1142  GLUCAP 152* 158* 155* 158* 228*    Recent Results (from the past 240 hour(s))  Culture, Urine     Status: None   Collection Time: 03/06/15  2:27 PM  Result Value Ref Range Status   Specimen Description URINE, RANDOM  Final   Special Requests NONE  Final   Culture >=100,000 COLONIES/mL KLEBSIELLA PNEUMONIAE  Final   Report Status 03/09/2015 FINAL  Final    Organism ID, Bacteria KLEBSIELLA PNEUMONIAE  Final      Susceptibility   Klebsiella pneumoniae - MIC*    AMPICILLIN 16 RESISTANT Resistant     CEFAZOLIN <=4 SENSITIVE Sensitive     CEFTRIAXONE <=1 SENSITIVE Sensitive     CIPROFLOXACIN <=0.25 SENSITIVE Sensitive     GENTAMICIN <=1 SENSITIVE Sensitive     IMIPENEM <=0.25 SENSITIVE Sensitive     NITROFURANTOIN 32 SENSITIVE Sensitive     TRIMETH/SULFA <=20 SENSITIVE Sensitive     AMPICILLIN/SULBACTAM 4 SENSITIVE Sensitive     PIP/TAZO <=4 SENSITIVE Sensitive     * >=100,000 COLONIES/mL KLEBSIELLA PNEUMONIAE     Scheduled Meds: . aspirin  300 mg Rectal Daily   Or  . aspirin  325 mg Oral Daily  . atorvastatin  20 mg Oral q1800  . heparin  5,000 Units Subcutaneous 3 times per day  . insulin aspart  0-15 Units Subcutaneous TID WC  . insulin aspart  0-5 Units Subcutaneous QHS  . insulin aspart  4 Units Subcutaneous TID WC  . insulin detemir  14 Units Subcutaneous BID  . levothyroxine  100 mcg Oral QAC breakfast  . pantoprazole  40 mg Oral Daily  . propranolol ER  80 mg Oral Daily   Continuous Infusions:    Olando Willems, DO  [facility] Pager [phone]  If 7PM-7AM, please contact night-coverage [email or web address] Password [email or web address] 03/09/2015, 12:12 PM   LOS: 4 days

## 2015-03-09 NOTE — Progress Notes (Signed)
Discharge orders received, Pt for discharge home today with home health. IV d/c'd. D/c instructions and RX given with verbalized understanding. Family at bedside to assist patient with discharge. Staff bought pt downstairs via wheelchair. 03/09/15 1431

## 2015-03-09 NOTE — Telephone Encounter (Signed)
Sister states pt is in hospital and was seen by dr Ardelia Mems this morning. Sister would like for dr to call her to talk about her sister.  She needs to talk to dr before pt is discharged  [phone]

## 2015-03-09 NOTE — Progress Notes (Signed)
Physical Therapy Treatment Patient Details Name: Leah Levine MRN: [id number] DOB: 1932-12-10 Today's Date: 03/09/2015    History of Present Illness Leah Levine is an 81 y.o. female who was in her normal state of health this am until around noon at which point she began having slurred speech and saying things that were not correct. She denies numbness or weakness. She was able to communicate fairly well, but when showed pctures, she has marked difficulty with naming of objects. PMH: tremor, B TKA, B CTR, DM2, HTN, CAD, back surgery, c-spine fusion, facial reconstruction with one eyebrow permanently raised and eyelid left.    PT Comments    Pt with improved mobility today though still requiring verbal cues for safety and due to impaired cognition. Pt overall min assist using RW. D/c plan has been updated to reflect going home with daughter (she is now again able to take her home) and recommending follow up HHPT for continued mobility training.    Follow Up Recommendations  Supervision/Assistance - 24 hour;Home health PT (daughter again is able to provide assist per notes)     Equipment Recommendations  None recommended by PT    Recommendations for Other Services       Precautions / Restrictions Precautions Precautions: Fall Restrictions Weight Bearing Restrictions: No    Mobility  Bed Mobility Overal bed mobility: Needs Assistance Bed Mobility: Supine to Sit     Supine to sit: Min assist     General bed mobility comments: Requires cues for sequencing and min assist to scoot to edge of bed. Attempting to stand up prior to feet being on the floor.  Transfers Overall transfer level: Needs assistance Equipment used: Rolling walker (2 wheeled) Transfers: Sit to/from Stand Sit to Stand: Min assist         General transfer comment: Min assist for balance and safe use of RW with verbal cues needed.   Ambulation/Gait Ambulation/Gait assistance: Min  guard Ambulation Distance (Feet): 120 Feet Assistive device: Rolling walker (2 wheeled) Gait Pattern/deviations: Step-through pattern;Decreased step length - right;Trunk flexed;Drifts right/left     General Gait Details: Pt with decreased awareness to obstacles on L side and required verbal cues as well as tactile cueing with Rw to get on straight path though this improved since yesterday.  Cues for keeping body inside of RW.   Stairs            Wheelchair Mobility    Modified Rankin (Stroke Patients Only)       Balance Overall balance assessment: Needs assistance Sitting-balance support: Feet supported;Single extremity supported Sitting balance-Leahy Scale: Fair     Standing balance support: Bilateral upper extremity supported;Single extremity supported;During functional activity;No upper extremity supported Standing balance-Leahy Scale: Fair                      Cognition Arousal/Alertness: Awake/alert Behavior During Therapy: Impulsive (less so than yesterday) Overall Cognitive Status: Impaired/Different from baseline Area of Impairment: Memory;Following commands;Safety/judgement;Awareness;Problem solving;Attention   Current Attention Level: Sustained Memory: Decreased short-term memory Following Commands: Follows one step commands consistently;Follows multi-step commands inconsistently Safety/Judgement: Decreased awareness of deficits;Decreased awareness of safety Awareness: Emergent Problem Solving: Difficulty sequencing;Requires verbal cues;Requires tactile cues General Comments: verbal cues for safety with mobility and safe use of RW. Pt improved with obstacle negotiation today though still required cues for attention to L    Exercises General Exercises - Lower Extremity Long Arc Quad: AROM;Strengthening;Both;20 reps;Seated Hip Flexion/Marching: AROM;Strengthening;20 reps;Both;Seated Toe Raises: AROM;Strengthening;Both;20 reps;Seated  Heel Raises:  AROM;Strengthening;20 reps;Both;Seated Other Exercises Other Exercises: Educated family on HEP for pt to be able to do when sitting up in chair during the day    General Comments        Pertinent Vitals/Pain Pain Assessment: No/denies pain    Home Living                      Prior Function            PT Goals (current goals can now be found in the care plan section) Acute Rehab PT Goals PT Goal Formulation: With patient/family Time For Goal Achievement: 03/20/15 Potential to Achieve Goals: Good Progress towards PT goals: Progressing toward goals    Frequency  Min 4X/week    PT Plan Discharge plan needs to be updated    Co-evaluation             End of Session Equipment Utilized During Treatment: Gait belt Activity Tolerance: Patient tolerated treatment well Patient left: in chair;with call bell/phone within reach;with chair alarm set;with family/visitor present     Time: [id number] PT Time Calculation (min) (ACUTE ONLY): 16 min  Charges:  $Therapeutic Exercise: 8-22 mins                    G Codes:      Juanna Cao, PT, DPT Pager #: [phone]  03/09/2015, 11:09 AM

## 2015-03-10 ENCOUNTER — Telehealth: Payer: Self-pay | Admitting: Nurse Practitioner

## 2015-03-10 NOTE — Telephone Encounter (Signed)
Detailed message left that Ronnald Collum, FNP agrees.

## 2015-03-10 NOTE — Telephone Encounter (Signed)
OK 

## 2015-03-11 ENCOUNTER — Telehealth: Payer: Self-pay | Admitting: *Deleted

## 2015-03-11 MED ORDER — NYSTATIN 100000 UNIT/GM EX CREA: 1.0000 "application " | TOPICAL_CREAM | Freq: Two times a day (BID) | CUTANEOUS | Status: DC

## 2015-03-11 NOTE — Telephone Encounter (Signed)
Call Completed and Appointment Scheduled: Yes, Date: 03/14/15 Dr Wendi Snipes  Will need BMP at office visit.   DISCHARGE INFORMATION Date of Discharge: 03/09/15  Discharge Facility: [facility]    Principal Discharge Diagnosis: Acute CVA, Aphasia  Patient and/or caregiver is knowledgeable of his/her condition(s) and treatment: Yes   MEDICATION RECONCILIATION Medication list reviewed with patient: Yes  Patient is able to obtain needed medications: Yes   ACTIVITIES OF DAILY LIVING  Is the patient able to perform his/her own ADLs: No: Difficulty walking. Patient has family to help with this.   Patient is receiving home health services: yes twice weekly   PATIENT EDUCATION Questions/Concerns Discussed: Daughter is concerned that she has a yeast infection on her abdomen. She has a history of this. She did not show the home health nurse yesterday. Will forward message to provider to see if they can prescribe nystatin powders.

## 2015-03-11 NOTE — Telephone Encounter (Signed)
Patient informed that prescription was sent in.

## 2015-03-11 NOTE — Telephone Encounter (Signed)
Nystatin sent for intertrigo  Laroy Apple, MD [facility] 03/11/2015, 1:56 PM

## 2015-03-11 NOTE — Addendum Note (Signed)
Addended by: Timmothy Euler on: 03/11/2015 01:57 PM   Modules accepted: Orders

## 2015-03-14 ENCOUNTER — Ambulatory Visit (INDEPENDENT_AMBULATORY_CARE_PROVIDER_SITE_OTHER): Payer: Federal, State, Local not specified - PPO | Admitting: Family Medicine

## 2015-03-14 ENCOUNTER — Encounter: Payer: Self-pay | Admitting: Family Medicine

## 2015-03-14 VITALS — BP 116/77 | HR 66 | Temp 97.0°F | Ht 61.2 in | Wt 160.6 lb

## 2015-03-14 DIAGNOSIS — N3000 Acute cystitis without hematuria: Secondary | ICD-10-CM

## 2015-03-14 DIAGNOSIS — L304 Erythema intertrigo: Secondary | ICD-10-CM | POA: Diagnosis not present

## 2015-03-14 MED ORDER — FLUCONAZOLE 150 MG PO TABS: ORAL_TABLET | ORAL | Status: DC

## 2015-03-14 NOTE — Progress Notes (Signed)
   HPI  Patient presents today here today for transitional care after an acute stroke.  Patient was admitted to [facility] and dc'd on 04/08/2014.   We have carefully reviewed her medicines and she does not have any questions  Her daughter is concerned that her UTI has not resolved that she has increased frequency of  Urination. No dysuria fever, chills, or foul-smelling  She has right-sided lower extremity weakness which is stable from this stroke. She's getting home health physical therapy  She's taking Lipitor and aspirin every day  PMH: Smoking status noted ROS: Per HPI  Objective: BP 116/77 mmHg  Pulse 66  Temp(Src) 97 F (36.1 C) (Oral)  Ht 5' 1.2" (1.554 m)  Wt 160 lb 9.6 oz (72.848 kg)  BMI 30.17 kg/m2 Gen: NAD, alert, cooperative with exam HEENT: NCAT, EOMI, PERRL CV: RRR, good S1/S2, no murmur Resp: CTABL, no wheezes, non-labored Abd: SNTND, BS present, no guarding or organomegaly Ext: No edema, warm Neuro: Alert and conversational, right-sided facial droop, otherwise her neurologic exam is normal  Except for 4/5 but symmetric strength in bilateral lower and upper extremities,  Intact sensation throughout, cranial nerves II through XII intact except for right-sided facial droop,  walks with a cane on the R hand  Assessment and plan:  # S/p Acute CVA Doing well, stable symptoms Meds are clear Emphasized ASA and lipitor F/u wihth neuro is arrnaged  # DM2 Uncontrolled, new Lantus start in teh hospital No hypoglycemia Reccommended f/u with clinical pharmacist in 2 weeks  # intertrigo Severe intertrigo unhelped for several months  Diflucan weekly X 6 weeks Continue nystatin  Repeat Urine culture  Orders Placed This Encounter  Procedures  . Urine culture    Meds ordered this encounter  Medications  . fluconazole (DIFLUCAN) 150 MG tablet    Sig: One pill once a week    Dispense:  6 tablet    Refill:  0    Laroy Apple, MD [facility] 03/14/2015, 4:32 PM

## 2015-03-14 NOTE — Patient Instructions (Signed)
Great to meet you!  Lets have you see Leah Levine for diabetes follow up in 2 weeks  Please come back to see [place] in about 1 month  We will call with results from her urine study within 1 week  Try diflucan 1 pill weekly for 6 weeks, continue the cream twice daily during this time.

## 2015-03-15 ENCOUNTER — Telehealth: Payer: Self-pay | Admitting: Nurse Practitioner

## 2015-03-15 NOTE — Telephone Encounter (Signed)
Dr Tat put pt on atorvastatin, they want to make sure that is ok?

## 2015-03-15 NOTE — Telephone Encounter (Signed)
That is fine 

## 2015-03-16 ENCOUNTER — Telehealth: Payer: Self-pay | Admitting: Nurse Practitioner

## 2015-03-16 LAB — URINE CULTURE

## 2015-03-16 NOTE — Telephone Encounter (Signed)
Don @ [facility] aware

## 2015-03-16 NOTE — Telephone Encounter (Signed)
Don at [facility] aware

## 2015-03-17 ENCOUNTER — Telehealth: Payer: Self-pay | Admitting: *Deleted

## 2015-03-17 ENCOUNTER — Other Ambulatory Visit: Payer: Self-pay | Admitting: Family Medicine

## 2015-03-17 DIAGNOSIS — R1310 Dysphagia, unspecified: Secondary | ICD-10-CM

## 2015-03-17 MED ORDER — CIPROFLOXACIN HCL 250 MG PO TABS: 250.0000 mg | ORAL_TABLET | Freq: Two times a day (BID) | ORAL | Status: DC

## 2015-03-17 NOTE — Telephone Encounter (Signed)
[facility] speech therapist would like to order a modified barium swallow on patient. Pt feels like food is getting stuck in her throat and her family is only giving her soup. Please send a referral to [facility]bi

## 2015-03-18 ENCOUNTER — Other Ambulatory Visit: Payer: Self-pay | Admitting: Nurse Practitioner

## 2015-03-18 DIAGNOSIS — R1310 Dysphagia, unspecified: Secondary | ICD-10-CM

## 2015-03-18 NOTE — Addendum Note (Signed)
Addended by: Chevis Pretty on: 03/18/2015 09:21 AM   Modules accepted: Orders

## 2015-03-18 NOTE — Telephone Encounter (Signed)
Barium swallow ordered

## 2015-03-19 ENCOUNTER — Other Ambulatory Visit: Payer: Self-pay | Admitting: Nurse Practitioner

## 2015-03-23 ENCOUNTER — Telehealth: Payer: Self-pay | Admitting: Family Medicine

## 2015-03-23 NOTE — Telephone Encounter (Signed)
V.o. Given per Dr. Laurance Flatten

## 2015-03-24 ENCOUNTER — Other Ambulatory Visit: Payer: Self-pay | Admitting: Nurse Practitioner

## 2015-03-24 ENCOUNTER — Ambulatory Visit (HOSPITAL_COMMUNITY): Payer: Federal, State, Local not specified - PPO | Attending: Nurse Practitioner | Admitting: Speech Pathology

## 2015-03-24 ENCOUNTER — Ambulatory Visit (HOSPITAL_COMMUNITY)
Admission: RE | Admit: 2015-03-24 | Discharge: 2015-03-24 | Disposition: A | Discharge status: Home / Self Care | Payer: Federal, State, Local not specified - PPO | Source: Ambulatory Visit | Attending: Nurse Practitioner | Admitting: Nurse Practitioner

## 2015-03-24 DIAGNOSIS — R1310 Dysphagia, unspecified: Secondary | ICD-10-CM | POA: Insufficient documentation

## 2015-03-24 DIAGNOSIS — Z8673 Personal history of transient ischemic attack (TIA), and cerebral infarction without residual deficits: Secondary | ICD-10-CM | POA: Insufficient documentation

## 2015-03-24 NOTE — Therapy (Signed)
[place], [place], [address] Phone: [phone]   Fax:  [phone]  Modified Barium Swallow  Patient Details  Name: Leah Levine MRN: [id number] Date of Birth: 28-Sep-1932 No Data Recorded  Encounter Date: 03/24/2015      End of Session - 03/24/15 1840    Visit Number 1   Number of Visits 1   Authorization Type [place]   SLP Start Time 1300   SLP Stop Time  1330   SLP Time Calculation (min) 30 min   Activity Tolerance Patient tolerated treatment well      Past Medical History  Diagnosis Date  . Tremor   . GERD (gastroesophageal reflux disease)   . Hyperlipidemia   . Allergy   . Diabetes mellitus type 2, controlled ([place])   . Hypertension     patient denies ever having hypertension  . Personal history of colonic polyps 11/20/2010    tubular adenomas  . Hypothyroidism   . Anemia   . Skin cancer     ear, right side of cheek  . CAD (coronary artery disease) 11-2012    CABG x 4 utilizing LIMA to LAD, SVG to Diagonal, SVG to Left Circumflex, and SVG to RCA    Past Surgical History  Procedure Laterality Date  . Appendectomy    . Cholecystectomy    . Abdominal hysterectomy    . Knee arthroscopy      bilateral  . Carpal tunnel release    . Back surgery    . Coronary artery bypass graft N/A 11/26/2012    Procedure: CORONARY ARTERY BYPASS GRAFTING (CABG);  Surgeon: Grace Isaac, MD;  Location: [place];  Service: Open Heart Surgery;  Laterality: N/A;  . Intraoperative transesophageal echocardiogram N/A 11/26/2012    Procedure: INTRAOPERATIVE TRANSESOPHAGEAL ECHOCARDIOGRAM;  Surgeon: Grace Isaac, MD;  Location: [place];  Service: Open Heart Surgery;  Laterality: N/A;  . Left heart catheterization with coronary angiogram N/A 11/25/2012    Procedure: LEFT HEART CATHETERIZATION WITH CORONARY ANGIOGRAM;  Surgeon: Larey Dresser, MD;  Location: [facility] CATH LAB;  Service: Cardiovascular;   Laterality: N/A;  . Loop recorder implant N/A 12/10/2013    Procedure: LOOP RECORDER IMPLANT;  Surgeon: Coralyn Mark, MD;  Location: [place] CATH LAB;  Service: Cardiovascular;  Laterality: N/A;  . Neck surgery      There were no vitals filed for this visit.  Visit Diagnosis: Dysphagia - Plan: SLP modified barium swallow      Subjective Assessment - 03/24/15 1837    Subjective "Somebody thinks I have a problem swallowing."   Patient is accompained by: Family member   Special Tests MBSS   Currently in Pain? No/denies             General - 03/24/15 1838    General Information   Date of Onset 03/05/15   HPI Leah Levine is an 81 yo woman who was admitted to [facility] from 03/05/2015-03/10/2015 for L internal capsule/basal ganglia infarct secondary to small vessel disease. She was referred for MBSS by Mary-Margaret Hassell Done, FNP due to home health SLP concerns over dysphagia. The patient tells me that she does not have a problem swallowing, she just has "no appetite".   Type of Study MBS-Modified Barium Swallow Study   Previous Swallow Assessment None on record   Diet Prior to this Study Regular;Thin liquids   Temperature Spikes Noted No   Respiratory Status Room air  History of Recent Intubation No   Behavior/Cognition Alert;Cooperative;Pleasant mood   Oral Cavity Assessment Within Functional Limits   Oral Care Completed by SLP No   Oral Cavity - Dentition Adequate natural dentition   Vision Functional for self feeding   Self-Feeding Abilities Able to feed self   Patient Positioning Upright in chair   Baseline Vocal Quality Normal   Volitional Cough Strong   Volitional Swallow Able to elicit   Anatomy Within functional limits   Pharyngeal Secretions Not observed secondary MBS            Oral Preparation/Oral Phase - 03/24/15 1839    Oral Preparation/Oral Phase   Oral Phase Within functional limits   Electrical stimulation - Oral Phase   Was Electrical  Stimulation Used No          Pharyngeal Phase - 03/24/15 1839    Pharyngeal Phase   Pharyngeal Phase Within functional limits   Electrical Stimulation - Pharyngeal Phase   Was Electrical Stimulation Used No          Cricopharyngeal Phase - 03/24/15 1839    Cervical Esophageal Phase   Cervical Esophageal Phase Within functional limits           Plan - 03/24/15 1840    Clinical Impression Statement Pt presented with barium tinged thin, puree, and regular textures as well as a barium tablet in the lateral position for fluoroscopy. Oropharyngeal swallow was WNL (no penetration/aspiration or residue). Esophageal sweep was also completed and was unremarkable. The study was reviewed with the pt and her family.Th pt states that "nothing tastes good now" and has a poor appetite. Perhaps pt's smell/taste impacted by recent stroke. Pt/family encouraged to experiment with sweet, salty, bitter, and sour to help increase appetite. No futher dysphagia services indicated at this time.    Consulted and Agree with Plan of Care Patient;Family member/caregiver            Recommendations/Treatment - 03/24/15 1839    Swallow Evaluation Recommendations   SLP Diet Recommendations Age appropriate regular;Thin   Liquid Administration via Cup;Straw   Medication Administration Whole meds with liquid   Supervision Patient able to self feed   Postural Changes Seated upright at 90 degrees;Remain upright for at least 30 minutes after feeds/meals          Prognosis - 03/24/15 1839    Prognosis   Prognosis for Safe Diet Advancement Good   Individuals Consulted   Consulted and Agree with Results and Recommendations Patient;Family member/caregiver   Report Sent to  Referring physician      Problem List Patient Active Problem List   Diagnosis Date Noted  . Intertrigo 03/14/2015  . Stroke (cerebrum) ([place]) 03/05/2015  . Expressive aphasia 03/05/2015  . Acute CVA (cerebrovascular accident)  ([place]) 03/05/2015  . Syncope 12/09/2013  . CAD (coronary artery disease) 09/18/2013  . Hypothyroidism 06/18/2013  . S/P CABG x 4 12/01/2012  . Vitamin B12 deficiency 12/05/2010  . Personal history of colonic polyps 12/05/2010  . Esophageal dysphagia 11/20/2010  . Benign neoplasm of colon 11/20/2010  . Gastritis, chronic 11/20/2010  . Allergic rhinitis 07/03/2010  . Tremor   . GERD (gastroesophageal reflux disease)   . Hyperlipidemia   . Hypertension   . Urinary incontinence   . Diabetes mellitus type 2, uncontrolled ([place])     PORTER,DABNEY 03/24/2015, 6:46 PM  [place], [place], [address] Phone: [phone]   Fax:  [phone]  Name: Leah Levine MRN: [id number] Date of Birth: 09/03/1932

## 2015-03-29 ENCOUNTER — Ambulatory Visit: Payer: Federal, State, Local not specified - PPO | Admitting: Neurology

## 2015-03-30 ENCOUNTER — Other Ambulatory Visit: Payer: Self-pay | Admitting: Nurse Practitioner

## 2015-03-30 ENCOUNTER — Encounter: Payer: Self-pay | Admitting: Pharmacist

## 2015-03-30 ENCOUNTER — Ambulatory Visit (INDEPENDENT_AMBULATORY_CARE_PROVIDER_SITE_OTHER): Payer: Federal, State, Local not specified - PPO | Admitting: Pharmacist

## 2015-03-30 VITALS — BP 120/75 | HR 67 | Ht 61.0 in | Wt 172.0 lb

## 2015-03-30 DIAGNOSIS — E1165 Type 2 diabetes mellitus with hyperglycemia: Secondary | ICD-10-CM

## 2015-03-30 DIAGNOSIS — Z794 Long term (current) use of insulin: Secondary | ICD-10-CM

## 2015-03-30 DIAGNOSIS — IMO0001 Reserved for inherently not codable concepts without codable children: Secondary | ICD-10-CM

## 2015-03-30 MED ORDER — INSULIN GLARGINE 100 UNIT/ML SOLOSTAR PEN: 32.0000 [IU] | PEN_INJECTOR | Freq: Every day | SUBCUTANEOUS | Status: DC

## 2015-03-30 NOTE — Patient Instructions (Signed)
Appointment with Dr Tat Friday, January 6th at 8:30 am  Increase Lantus to 32 units each night for next 7 days.  If blood glucose is still over 150 in the morning (fasting) then increase to 34 units once a day.  Diabetes and Standards of Medical Care   Diabetes is complicated. You may find that your diabetes team includes a dietitian, nurse, diabetes educator, eye doctor, and more. To help everyone know what is going on and to help you get the care you deserve, the following schedule of care was developed to help keep you on track. Below are the tests, exams, vaccines, medicines, education, and plans you will need.  Blood Glucose Goals Prior to meals = 80 - 130 Within 2 hours of the start of a meal = less than 180  HbA1c test (goal is less than 7.0% - your last value was 11.0%) This test shows how well you have controlled your glucose over the past 2 to 3 months. It is used to see if your diabetes management plan needs to be adjusted.   It is performed at least 2 times a year if you are meeting treatment goals.  It is performed 4 times a year if therapy has changed or if you are not meeting treatment goals.  Blood pressure test  This test is performed at every routine medical visit. The goal is less than 140/90 mmHg for most people, but 130/80 mmHg in some cases. Ask your health care provider about your goal.  Dental exam  Follow up with the dentist regularly.  Eye exam  If you are diagnosed with type 1 diabetes as a child, get an exam upon reaching the age of 17 years or older and have had diabetes for 3 to 5 years. Yearly eye exams are recommended after that initial eye exam.  If you are diagnosed with type 1 diabetes as an adult, get an exam within 5 years of diagnosis and then yearly.  If you are diagnosed with type 2 diabetes, get an exam as soon as possible after the diagnosis and then yearly.  Foot care exam  Visual foot exams are performed at every routine medical visit.  The exams check for cuts, injuries, or other problems with the feet.  A comprehensive foot exam should be done yearly. This includes visual inspection as well as assessing foot pulses and testing for loss of sensation.  Check your feet nightly for cuts, injuries, or other problems with your feet. Tell your health care provider if anything is not healing.  Kidney function test (urine microalbumin)  This test is performed once a year.  Type 1 diabetes: The first test is performed 5 years after diagnosis.  Type 2 diabetes: The first test is performed at the time of diagnosis.  A serum creatinine and estimated glomerular filtration rate (eGFR) test is done once a year to assess the level of chronic kidney disease (CKD), if present.  Lipid profile (cholesterol, HDL, LDL, triglycerides)  Performed every 5 years for most people.  The goal for LDL is less than 100 mg/dL. If you are at high risk, the goal is less than 70 mg/dL.  The goal for HDL is 40 mg/dL to 50 mg/dL for men and 50 mg/dL to 60 mg/dL for women. An HDL cholesterol of 60 mg/dL or higher gives some protection against heart disease.  The goal for triglycerides is less than 150 mg/dL.  Influenza vaccine, pneumococcal vaccine, and hepatitis B vaccine  The influenza vaccine  is recommended yearly.  The pneumococcal vaccine is generally given once in a lifetime. However, there are some instances when another vaccination is recommended. Check with your health care provider.  The hepatitis B vaccine is also recommended for adults with diabetes.  Diabetes self-management education  Education is recommended at diagnosis and ongoing as needed.  Treatment plan  Your treatment plan is reviewed at every medical visit.  Document Released: 01/07/2009 Document Revised: 11/12/2012 Document Reviewed: 08/12/2012 [facility] Patient Information 2014 [place].

## 2015-03-30 NOTE — Progress Notes (Signed)
  Subjective:    Leah Levine is a 82 y.o. female who presents for uncontrolled Type 2 diabetes mellitus.  She was recently hospitalized 02/2015 for stroke.  While in hospital she was started in Lantus insulin due to A1c of 11%.  She has previously refused insulin therapy.  She is doing well with daily injection and her daughter is administering.    HBG reading have ranges from 122 to 360.    Known diabetic complications: cardiovascular disease Cardiovascular risk factors: advanced age (older than 61 for men, 80 for women), diabetes mellitus, dyslipidemia and obesity (BMI >= 30 kg/m2) Current diabetic medications include Lantus 30 units qpm; janumet 50/1000mg  bid;  Eye exam current (within one year): no  Any episodes of hypoglycemia? no   Objective:    BP 120/75 mmHg  Pulse 67  Ht 5\' 1"  (1.549 m)  Wt 172 lb (78.019 kg)  BMI 32.52 kg/m2   FBG = 134 in office today A1c = 11% )03/06/2015)   Lab Review GLUCOSE (mg/dL)  Date Value  11/11/2014 252*  10/29/2014 384*  04/23/2014 169*   GLUCOSE, BLD (mg/dL)  Date Value  03/07/2015 197*  03/06/2015 277*  03/05/2015 345*   CO2 (mmol/L)  Date Value  03/07/2015 25  03/06/2015 25  03/05/2015 18*   BUN (mg/dL)  Date Value  03/07/2015 15  03/06/2015 16  03/05/2015 29*  11/11/2014 19  10/29/2014 13  04/23/2014 14   CREAT (mg/dL)  Date Value  09/03/2012 0.88   CREATININE, SER (mg/dL)  Date Value  03/07/2015 0.84  03/06/2015 0.86  03/05/2015 0.80   Assessment:    Diabetes Mellitus type II, under improving  Plan:    1.  Rx changes:    Changed Lantus to Basaglar as patient's formulary has changed in 2017 and lantus is not covered.  Also increased to 32 units daily for the next 7 days.  If BG is still over 150 in the am then she is to increase to 34 units daily.    Continue Janumet 50/1000mg  bid with food 2.  Education: Reviewed 'ABCs' of diabetes management (respective goals in parentheses):  A1C (<7),  blood pressure (<130/80), and cholesterol (LDL <100).  Discussed s/s of hypoglycemia and handout given.  This is especially improtant since patient is taking propranolol which can mask hypoglycemia.  However until after patient sees neurologist and she is stable CV wise will not change beta blocker since she has a history of syncope.   3.  Briefly reviewed high CHO foods - patient and daughter felt they know about CHO content of various foods   4.  Patient declined influenza and pneumonia vaccines today  Cherre Robins, PharmD, CPP, CDE

## 2015-04-01 ENCOUNTER — Ambulatory Visit: Payer: Federal, State, Local not specified - PPO | Admitting: Neurology

## 2015-04-01 ENCOUNTER — Encounter: Payer: Self-pay | Admitting: Neurology

## 2015-04-04 ENCOUNTER — Ambulatory Visit (INDEPENDENT_AMBULATORY_CARE_PROVIDER_SITE_OTHER): Payer: Federal, State, Local not specified - PPO | Admitting: *Deleted

## 2015-04-04 DIAGNOSIS — R55 Syncope and collapse: Secondary | ICD-10-CM | POA: Diagnosis not present

## 2015-04-05 ENCOUNTER — Ambulatory Visit: Payer: Federal, State, Local not specified - PPO | Admitting: Family Medicine

## 2015-04-05 ENCOUNTER — Ambulatory Visit (INDEPENDENT_AMBULATORY_CARE_PROVIDER_SITE_OTHER): Payer: Federal, State, Local not specified - PPO | Admitting: Family

## 2015-04-05 ENCOUNTER — Other Ambulatory Visit: Payer: Self-pay

## 2015-04-05 ENCOUNTER — Encounter: Payer: Self-pay | Admitting: Family

## 2015-04-05 ENCOUNTER — Other Ambulatory Visit: Payer: Self-pay | Admitting: Family

## 2015-04-05 VITALS — BP 131/71 | HR 65 | Temp 97.2°F | Ht 61.0 in | Wt 165.8 lb

## 2015-04-05 DIAGNOSIS — E538 Deficiency of other specified B group vitamins: Secondary | ICD-10-CM | POA: Diagnosis not present

## 2015-04-05 DIAGNOSIS — R630 Anorexia: Secondary | ICD-10-CM | POA: Diagnosis not present

## 2015-04-05 DIAGNOSIS — E039 Hypothyroidism, unspecified: Secondary | ICD-10-CM | POA: Diagnosis not present

## 2015-04-05 DIAGNOSIS — I63312 Cerebral infarction due to thrombosis of left middle cerebral artery: Secondary | ICD-10-CM | POA: Diagnosis not present

## 2015-04-05 DIAGNOSIS — R4701 Aphasia: Secondary | ICD-10-CM

## 2015-04-05 DIAGNOSIS — R5383 Other fatigue: Secondary | ICD-10-CM | POA: Diagnosis not present

## 2015-04-05 LAB — POCT UA - MICROSCOPIC ONLY
BACTERIA, U MICROSCOPIC: NEGATIVE
Casts, Ur, LPF, POC: NEGATIVE
Crystals, Ur, HPF, POC: NEGATIVE
Yeast, UA: NEGATIVE

## 2015-04-05 LAB — POCT URINALYSIS DIPSTICK
Bilirubin, UA: NEGATIVE
Blood, UA: NEGATIVE
GLUCOSE UA: NEGATIVE
Ketones, UA: NEGATIVE
Nitrite, UA: NEGATIVE
PROTEIN UA: NEGATIVE
Spec Grav, UA: >=1.03
UROBILINOGEN UA: NEGATIVE
pH, UA: 5

## 2015-04-05 MED ORDER — CIPROFLOXACIN HCL 500 MG PO TABS: 500.0000 mg | ORAL_TABLET | Freq: Two times a day (BID) | ORAL | Status: DC

## 2015-04-05 NOTE — Addendum Note (Signed)
Addended by: Selmer Dominion on: 04/05/2015 11:52 AM   Modules accepted: Orders

## 2015-04-05 NOTE — Progress Notes (Signed)
   Subjective:    Patient ID: Leah Levine, female    DOB: 1932/04/29, 82 y.o.   MRN: [id number]  HPI Pt presents to the office today with daughter and daughter-in-law and states states has been very sleepy and has had no appetite. Pt's daughter states she has been like this since being discharged from the hospital on 03/07/15 from a CVA. Pt denies any fevers, dysuria, SOB, chest pain, edema, hematuria, or GI bleed. Daughter denies any snoring or sleep apnea. Pt states she feels well rested when she wakes, but is just feels fatigue and can sleep 16 hours or more at a time.    Review of Systems  Constitutional: Negative.   HENT: Negative.   Eyes: Negative.   Respiratory: Negative.  Negative for shortness of breath.   Cardiovascular: Negative.  Negative for palpitations.  Gastrointestinal: Negative.   Endocrine: Negative.   Genitourinary: Negative.   Musculoskeletal: Negative.   Neurological: Negative.  Negative for headaches.  Hematological: Negative.   Psychiatric/Behavioral: Negative.   All other systems reviewed and are negative.      Objective:   Physical Exam  Constitutional: She is oriented to person, place, and time. She appears well-developed and well-nourished. No distress.  HENT:  Head: Normocephalic and atraumatic.  Right Ear: External ear normal.  Left Ear: External ear normal.  Eyes: Pupils are equal, round, and reactive to light.  Neck: Normal range of motion. Neck supple. No thyromegaly present.  Cardiovascular: Normal rate, regular rhythm, normal heart sounds and intact distal pulses.   No murmur heard. Pulmonary/Chest: Effort normal and breath sounds normal. No respiratory distress. She has no wheezes.  Abdominal: Soft. Bowel sounds are normal. She exhibits no distension. There is no tenderness.  Musculoskeletal: Normal range of motion. She exhibits no edema or tenderness.  Neurological: She is alert and oriented to person, place, and time. She has  normal reflexes. No cranial nerve deficit.  Skin: Skin is warm and dry.  Psychiatric: She has a normal mood and affect. Her behavior is normal. Judgment and thought content normal.  Pt has expressive aphasia at times  Vitals reviewed.     BP 131/71 mmHg  Pulse 65  Temp(Src) 97.2 F (36.2 C) (Oral)  Ht '5\' 1"'$  (1.549 m)  Wt 165 lb 12.8 oz (75.206 kg)  BMI 31.34 kg/m2  SpO2 96%     Assessment & Plan:  1. Other fatigue - Anemia Profile B - CMP14+EGFR - Thyroid Panel With TSH - POCT urinalysis dipstick - POCT UA - Microscopic Only  2. No appetite - Anemia Profile B - CMP14+EGFR - Thyroid Panel With TSH - POCT urinalysis dipstick - POCT UA - Microscopic Only  3. Cerebrovascular accident (CVA) due to thrombosis of left middle cerebral artery ([place]) -Continue working with PT - CMP14+EGFR  4. Vitamin B12 deficiency - Anemia Profile B - CMP14+EGFR  5. Hypothyroidism, unspecified hypothyroidism type - CMP14+EGFR - Thyroid Panel With TSH  6. Expressive aphasia - CMP14+EGFR   Continue all meds Labs pending Health Maintenance reviewed Diet and exercise encouraged and continue with PT RTO as needed and keep chronic follow up appts  Evelina Dun, FNP

## 2015-04-05 NOTE — Patient Instructions (Signed)
Fatigue Fatigue is feeling tired all of the time, a lack of energy, or a lack of motivation. Occasional or mild fatigue is often a normal response to activity or life in general. However, long-lasting (chronic) or extreme fatigue may indicate an underlying medical condition. HOME CARE INSTRUCTIONS  Watch your fatigue for any changes. The following actions may help to lessen any discomfort you are feeling:  Talk to your health care provider about how much sleep you need each night. Try to get the required amount every night.  Take medicines only as directed by your health care provider.  Eat a healthy and nutritious diet. Ask your health care provider if you need help changing your diet.  Drink enough fluid to keep your urine clear or pale yellow.  Practice ways of relaxing, such as yoga, meditation, massage therapy, or acupuncture.  Exercise regularly.   Change situations that cause you stress. Try to keep your work and personal routine reasonable.  Do not abuse illegal drugs.  Limit alcohol intake to no more than 1 drink per day for nonpregnant women and 2 drinks per day for men. One drink equals 12 ounces of beer, 5 ounces of wine, or 1 ounces of hard liquor.  Take a multivitamin, if directed by your health care provider. SEEK MEDICAL CARE IF:   Your fatigue does not get better.  You have a fever.   You have unintentional weight loss or gain.  You have headaches.   You have difficulty:   Falling asleep.  Sleeping throughout the night.  You feel angry, guilty, anxious, or sad.   You are unable to have a bowel movement (constipation).   You skin is dry.   Your legs or another part of your body is swollen.  SEEK IMMEDIATE MEDICAL CARE IF:   You feel confused.   Your vision is blurry.  You feel faint or pass out.   You have a severe headache.   You have severe abdominal, pelvic, or back pain.   You have chest pain, shortness of breath, or an  irregular or fast heartbeat.   You are unable to urinate or you urinate less than normal.   You develop abnormal bleeding, such as bleeding from the rectum, vagina, nose, lungs, or nipples.  You vomit blood.   You have thoughts about harming yourself or committing suicide.   You are worried that you might harm someone else.    This information is not intended to replace advice given to you by your health care provider. Make sure you discuss any questions you have with your health care provider.   Document Released: 01/07/2007 Document Revised: 04/02/2014 Document Reviewed: 07/14/2013 Elsevier Interactive Patient Education 2016 [place] After a Stroke, Adult A stroke can cause many types of problems. The treatment of stroke involves three stages:  Prevention.  Treatment immediately following a stroke.  Rehabilitation after a stroke. HOW IS MY REHABILITATION PLAN DEVELOPED? A detailed exam by your health care provider helps outline what problems were caused by the stroke. Your health care provider may consult specialists. The specialists may include doctors, occupational and physical therapists, and speech therapists. It is then possible to make a plan that best fits your needs.  Your evaluation might include the following:  Evaluation of your ability to do daily activities that require using muscles, coordination, vision, reasoning, memory, and problem solving. Interviews with you and your health care provider will help determine what you could do and could not do before  the stroke.  Evaluation of your ability to do personal self-care tasks, such as dressing, grooming, and eating.  Tests to see if there are sensory and motor changes due to the stroke, especially in the hands and legs. WHAT ARE THE TYPES OF REHABILITATION? Your health care provider may have you start rehabilitation right away depending on the type and severity of your stroke. Rehabilitation  after a stroke is focused on getting function back and preventing another stroke. Rehabilitation might include:   Physical therapy. This can include help with walking, sitting, lying down, and balance. It may also be designed to help prevent shortening of the muscles (contractures) and swelling (edema).  Occupational therapy. This therapy helps you to relearn skills needed for leading a normal life. These could include eating, using the restroom, dressing, and taking care of yourself. It helps to make you more independent.  Vision therapy. This can help you to retrain, strengthen, and improve your vision after a stroke.  Speech therapy. This can help to improve your speech and communication skills. It also teaches you and your family members to cope with problems of being unable to communicate.  Cognitive therapy. This therapy can help with problems caused by lack of memory, attention, or concentration.  Psychological or psychiatric therapy. This can help you cope with problems of frustration and emotional problems that may develop after a stroke.    This information is not intended to replace advice given to you by your health care provider. Make sure you discuss any questions you have with your health care provider.   Document Released: 04/01/2007 Document Revised: 07/27/2014 Document Reviewed: 08/14/2012 Elsevier Interactive Patient Education [employer].

## 2015-04-05 NOTE — Telephone Encounter (Signed)
Last seen 04/05/15 Leah Levine  This was sent over from [facility]  Not on EPIC list

## 2015-04-05 NOTE — Progress Notes (Signed)
[facility] Summary Report / Loop Recorder 

## 2015-04-06 ENCOUNTER — Encounter: Payer: Federal, State, Local not specified - PPO | Admitting: Internal Medicine

## 2015-04-06 ENCOUNTER — Other Ambulatory Visit: Payer: Self-pay | Admitting: Family

## 2015-04-06 LAB — ANEMIA PROFILE B
BASOS ABS: 0 10*3/uL (ref 0.0–0.2)
BASOS: 0 %
EOS (ABSOLUTE): 0.2 10*3/uL (ref 0.0–0.4)
Eos: 2 %
FERRITIN: 303 ng/mL — AB (ref 15–150)
FOLATE: 11 ng/mL (ref >3.0)
Hematocrit: 40.7 % (ref 34.0–46.6)
Hemoglobin: 13.5 g/dL (ref 11.1–15.9)
IRON: 72 ug/dL (ref 27–139)
Immature Grans (Abs): 0 10*3/uL (ref 0.0–0.1)
Immature Granulocytes: 0 %
Iron Saturation: 27 % (ref 15–55)
LYMPHS ABS: 2.6 10*3/uL (ref 0.7–3.1)
Lymphs: 32 %
MCH: 26.6 pg (ref 26.6–33.0)
MCHC: 33.2 g/dL (ref 31.5–35.7)
MCV: 80 fL (ref 79–97)
MONOCYTES: 8 %
Monocytes Absolute: 0.6 10*3/uL (ref 0.1–0.9)
NEUTROS ABS: 4.7 10*3/uL (ref 1.4–7.0)
Neutrophils: 58 %
PLATELETS: 221 10*3/uL (ref 150–379)
RBC: 5.08 x10E6/uL (ref 3.77–5.28)
RDW: 15.6 % — AB (ref 12.3–15.4)
RETIC CT PCT: 1 % (ref 0.6–2.6)
TIBC: 270 ug/dL (ref 250–450)
UIBC: 198 ug/dL (ref 118–369)
VITAMIN B 12: 358 pg/mL (ref 211–946)
WBC: 8.1 10*3/uL (ref 3.4–10.8)

## 2015-04-06 LAB — CMP14+EGFR
A/G RATIO: 1.5 (ref 1.1–2.5)
ALBUMIN: 4 g/dL (ref 3.5–4.7)
ALK PHOS: 59 IU/L (ref 39–117)
ALT: 10 IU/L (ref 0–32)
AST: 25 IU/L (ref 0–40)
BILIRUBIN TOTAL: 0.4 mg/dL (ref 0.0–1.2)
BUN / CREAT RATIO: 8 — AB (ref 11–26)
BUN: 10 mg/dL (ref 8–27)
CHLORIDE: 100 mmol/L (ref 96–106)
CO2: 24 mmol/L (ref 18–29)
Calcium: 9.4 mg/dL (ref 8.7–10.3)
Creatinine, Ser: 1.26 mg/dL — ABNORMAL HIGH (ref 0.57–1.00)
GFR calc non Af Amer: 40 mL/min/{1.73_m2} — ABNORMAL LOW (ref >59)
GFR, EST AFRICAN AMERICAN: 46 mL/min/{1.73_m2} — AB (ref >59)
Globulin, Total: 2.7 g/dL (ref 1.5–4.5)
Glucose: 159 mg/dL — ABNORMAL HIGH (ref 65–99)
POTASSIUM: 4.3 mmol/L (ref 3.5–5.2)
Sodium: 141 mmol/L (ref 134–144)
TOTAL PROTEIN: 6.7 g/dL (ref 6.0–8.5)

## 2015-04-06 LAB — URINE CULTURE

## 2015-04-06 LAB — THYROID PANEL WITH TSH
Free Thyroxine Index: 3 (ref 1.2–4.9)
T3 Uptake Ratio: 25 % (ref 24–39)
T4, Total: 12 ug/dL (ref 4.5–12.0)
TSH: 0.383 u[IU]/mL — AB (ref 0.450–4.500)

## 2015-04-06 MED ORDER — LEVOTHYROXINE SODIUM 88 MCG PO TABS: 88.0000 ug | ORAL_TABLET | Freq: Every day | ORAL | Status: DC

## 2015-04-07 ENCOUNTER — Encounter: Payer: Self-pay | Admitting: Internal Medicine

## 2015-04-08 ENCOUNTER — Telehealth: Payer: Self-pay | Admitting: Family Medicine

## 2015-04-11 ENCOUNTER — Other Ambulatory Visit: Payer: Self-pay | Admitting: Nurse Practitioner

## 2015-04-11 NOTE — Telephone Encounter (Signed)
V.o. Given per Dr. Bradshaw 

## 2015-04-14 ENCOUNTER — Encounter: Payer: Self-pay | Admitting: Family Medicine

## 2015-04-14 ENCOUNTER — Ambulatory Visit (INDEPENDENT_AMBULATORY_CARE_PROVIDER_SITE_OTHER): Payer: Federal, State, Local not specified - PPO | Admitting: Family Medicine

## 2015-04-14 VITALS — BP 115/68 | HR 68 | Temp 97.6°F | Ht 61.0 in | Wt 166.4 lb

## 2015-04-14 DIAGNOSIS — E118 Type 2 diabetes mellitus with unspecified complications: Secondary | ICD-10-CM

## 2015-04-14 DIAGNOSIS — M19041 Primary osteoarthritis, right hand: Secondary | ICD-10-CM

## 2015-04-14 DIAGNOSIS — IMO0002 Reserved for concepts with insufficient information to code with codable children: Secondary | ICD-10-CM

## 2015-04-14 DIAGNOSIS — L304 Erythema intertrigo: Secondary | ICD-10-CM

## 2015-04-14 DIAGNOSIS — E1165 Type 2 diabetes mellitus with hyperglycemia: Secondary | ICD-10-CM | POA: Diagnosis not present

## 2015-04-14 DIAGNOSIS — M19042 Primary osteoarthritis, left hand: Secondary | ICD-10-CM

## 2015-04-14 DIAGNOSIS — I10 Essential (primary) hypertension: Secondary | ICD-10-CM

## 2015-04-14 DIAGNOSIS — Z794 Long term (current) use of insulin: Secondary | ICD-10-CM

## 2015-04-14 NOTE — Patient Instructions (Signed)
Great to see you!   Come back in mid March to discuss diabetes, sooner if you need [place].

## 2015-04-14 NOTE — Progress Notes (Signed)
   HPI  Patient presents today for routine follow-up.  She was in the hospital in December for a stroke, she recovered well and feels well today.  She is using Lantus regularly, has no complaints about it. No hypoglycemia Average fasting blood sugars 1:30 to 150.  No complaints of chest pain, dyspnea, palpitations.  Her rash has nearly resolved with the interventions that we gave last time.  She does have some right hand greater than left hand osteoarthritis, helped by Tylenol but she does not use this when necessary frequently.  PMH: Smoking status noted ROS: Per HPI  Objective: BP 115/68 mmHg  Pulse 68  Temp(Src) 97.6 F (36.4 C) (Oral)  Ht '5\' 1"'$  (1.549 m)  Wt 166 lb 6.4 oz (75.479 kg)  BMI 31.46 kg/m2 Gen: NAD, alert, cooperative with exam HEENT: NCAT CV: RRR, good S1/S2, no murmur Resp: CTABL, no wheezes, non-labored Ext: No edema, warm Neuro: Alert and oriented, No gross deficits  Assessment and plan:  # Intertrigo Resolving Continue with however, stop Diflucan whenever she is finished with the pills prescribed  # Hypertension Well-controlled on current meds Continue beta blocker Checking BMP as her creatinine was elevated from her baseline on last check 9 days ago   # Type 2 diabetes Doing well, fasting blood sugar sounds like it's a goal A1c in 2 months  # Osteoarthritis Lateral hands the right greater than left Discussed Aspercreme, 1 Tylenol up to 3 times a day     Orders Placed This Encounter  Procedures  . [place], MD [facility] 04/14/2015, 9:40 AM

## 2015-04-15 LAB — BMP8+EGFR
BUN/Creatinine Ratio: 8 — ABNORMAL LOW (ref 11–26)
BUN: 8 mg/dL (ref 8–27)
CALCIUM: 9.2 mg/dL (ref 8.7–10.3)
CO2: 25 mmol/L (ref 18–29)
CREATININE: 1.04 mg/dL — AB (ref 0.57–1.00)
Chloride: 99 mmol/L (ref 96–106)
GFR calc Af Amer: 59 mL/min/{1.73_m2} — ABNORMAL LOW (ref >59)
GFR, EST NON AFRICAN AMERICAN: 51 mL/min/{1.73_m2} — AB (ref >59)
GLUCOSE: 180 mg/dL — AB (ref 65–99)
POTASSIUM: 4.5 mmol/L (ref 3.5–5.2)
SODIUM: 139 mmol/L (ref 134–144)

## 2015-04-18 ENCOUNTER — Telehealth: Payer: Self-pay | Admitting: Neurology

## 2015-04-18 NOTE — Telephone Encounter (Signed)
Patient dismissed from [facility] by Wells Guiles Tat DO , effective April 01, 2015. Dismissal letter sent out by certified / registered mail.  [facility]  Certified dismissal letter returned as undeliverable, unclaimed, return to sender after three attempts by [place]. Letter placed in another envelope and resent as 1st class mail which does not require a signature. 05/12/15 DAJ

## 2015-04-19 ENCOUNTER — Telehealth: Payer: Self-pay | Admitting: Family Medicine

## 2015-04-19 NOTE — Telephone Encounter (Signed)
She was seen by neurology on 09/22/2014 at [facility] neuro, she is due for a six-month follow-up.  She does not need a referral  Laroy Apple, MD [facility] 04/19/2015, 6:07 PM

## 2015-04-20 NOTE — Telephone Encounter (Signed)
Spoke to [facility]) advised don't need referral and to just call to schedule appt. She voiced understanding.

## 2015-04-23 LAB — CUP PACEART REMOTE DEVICE CHECK
Date Time Interrogation Session: 20161012000559
MDC IDC SESS DTM: 20161211010528

## 2015-04-26 ENCOUNTER — Ambulatory Visit (INDEPENDENT_AMBULATORY_CARE_PROVIDER_SITE_OTHER): Payer: Federal, State, Local not specified - PPO | Admitting: Family Medicine

## 2015-04-26 ENCOUNTER — Encounter: Payer: Self-pay | Admitting: Internal Medicine

## 2015-04-26 DIAGNOSIS — E119 Type 2 diabetes mellitus without complications: Secondary | ICD-10-CM | POA: Diagnosis not present

## 2015-04-26 DIAGNOSIS — I69320 Aphasia following cerebral infarction: Secondary | ICD-10-CM | POA: Diagnosis not present

## 2015-04-26 DIAGNOSIS — I10 Essential (primary) hypertension: Secondary | ICD-10-CM

## 2015-04-26 DIAGNOSIS — I69351 Hemiplegia and hemiparesis following cerebral infarction affecting right dominant side: Secondary | ICD-10-CM | POA: Diagnosis not present

## 2015-04-27 ENCOUNTER — Encounter: Payer: Self-pay | Admitting: Cardiology

## 2015-04-28 ENCOUNTER — Other Ambulatory Visit: Payer: Self-pay | Admitting: Family

## 2015-05-03 ENCOUNTER — Telehealth: Payer: Self-pay | Admitting: Family Medicine

## 2015-05-03 ENCOUNTER — Other Ambulatory Visit: Payer: Self-pay

## 2015-05-03 NOTE — Telephone Encounter (Signed)
No refills of antibiotics without eval.   Laroy Apple, MD [facility] 05/03/2015, 4:42 PM

## 2015-05-03 NOTE — Telephone Encounter (Signed)
Pt aware and states she doesn't think she needs them as she is feeling much better.

## 2015-05-04 ENCOUNTER — Ambulatory Visit (INDEPENDENT_AMBULATORY_CARE_PROVIDER_SITE_OTHER): Payer: Federal, State, Local not specified - PPO | Admitting: *Deleted

## 2015-05-04 DIAGNOSIS — R55 Syncope and collapse: Secondary | ICD-10-CM | POA: Diagnosis not present

## 2015-05-05 ENCOUNTER — Encounter: Payer: Self-pay | Admitting: Family Medicine

## 2015-05-05 ENCOUNTER — Encounter: Payer: Self-pay | Admitting: Internal Medicine

## 2015-05-05 ENCOUNTER — Ambulatory Visit (INDEPENDENT_AMBULATORY_CARE_PROVIDER_SITE_OTHER): Payer: Federal, State, Local not specified - PPO | Admitting: Family Medicine

## 2015-05-05 VITALS — BP 126/62 | HR 63 | Temp 97.4°F | Ht 61.0 in | Wt 166.6 lb

## 2015-05-05 DIAGNOSIS — G4719 Other hypersomnia: Secondary | ICD-10-CM

## 2015-05-05 MED ORDER — TRAZODONE HCL 50 MG PO TABS: ORAL_TABLET | ORAL | Status: DC

## 2015-05-05 NOTE — Patient Instructions (Signed)
   Try to set a regular bedtime and waking time No TV in Bed Stop caffeine (sweet tea, sodas, coffee) at 3 pm  See the handout  Try trazodone 30 minutes before bed.

## 2015-05-05 NOTE — Progress Notes (Signed)
[facility] Summary Report / Loop Recorder 

## 2015-05-05 NOTE — Progress Notes (Signed)
   HPI  Patient presents today here with questions about narcolepsy  He patient and her family explains that over the last 5 years she's had difficulty staying awake during the daytime. They described rapid onset of sleep multiple times a day lasting 15 minutes to 3 hours She does not have frequent somnolent episodes lasting less than 5 minutes.  She explains that she has not been sleeping very well since her stroke. She goes to sleeps somewhere between 11  Pm . and 2 AM has multiple awakenings, and gets up every day around 10 AM to 11 AM  she drinks caffeinated drinks throughout the day She also watches TV at night in bed.   PMH: Smoking status noted ROS: Per HPI  Objective: BP 126/62 mmHg  Pulse 63  Temp(Src) 97.4 F (36.3 C) (Oral)  Ht 5\' 1"  (1.549 m)  Wt 166 lb 9.6 oz (75.569 kg)  BMI 31.49 kg/m2 Gen: NAD, alert, cooperative with exam HEENT: NCAT CV: RRR, good S1/S2, no murmur Resp: CTABL, no wheezes, non-labored Ext: No edema, warm Neuro: Alert and oriented, No gross deficits  Assessment and plan:  # Daytime sleepiness Not consistent with Narcolepsy in my opinion, however given their request I havre referred to Dr. Annamaria Boots for discussion and possible testing if he deems necessary Attempt to improve night time sleep- Sleep Hygiene and Trazodone.   F/u with me in 1 month for diabetes  Discouraged drinking sugar sweetened beverages  Orders Placed This Encounter  Procedures  . Ambulatory referral to Pulmonology    Referral Priority:  Routine    Referral Type:  Consultation    Referral Reason:  Specialty Services Required    Requested Specialty:  Pulmonary Disease    Number of Visits Requested:  1    Meds ordered this encounter  Medications  . traZODone (DESYREL) 50 MG tablet    Sig: 1 to 2 tablets 30 minutes before bed as needed for difficulty sleeping    Dispense:  60 tablet    Refill:  [place], MD [facility] 05/05/2015,  10:11 AM

## 2015-05-06 ENCOUNTER — Encounter: Payer: Self-pay | Admitting: Cardiology

## 2015-05-18 ENCOUNTER — Ambulatory Visit (INDEPENDENT_AMBULATORY_CARE_PROVIDER_SITE_OTHER): Payer: Federal, State, Local not specified - PPO | Admitting: Cardiology

## 2015-05-18 ENCOUNTER — Encounter: Payer: Self-pay | Admitting: Cardiology

## 2015-05-18 ENCOUNTER — Ambulatory Visit (INDEPENDENT_AMBULATORY_CARE_PROVIDER_SITE_OTHER): Payer: Federal, State, Local not specified - PPO

## 2015-05-18 ENCOUNTER — Encounter: Payer: Self-pay | Admitting: Family Medicine

## 2015-05-18 ENCOUNTER — Ambulatory Visit (INDEPENDENT_AMBULATORY_CARE_PROVIDER_SITE_OTHER): Payer: Federal, State, Local not specified - PPO | Admitting: Family Medicine

## 2015-05-18 VITALS — BP 118/72 | HR 72 | Ht 60.0 in | Wt 165.0 lb

## 2015-05-18 VITALS — BP 128/68 | HR 64 | Temp 97.2°F | Ht 60.0 in | Wt 166.6 lb

## 2015-05-18 DIAGNOSIS — L89151 Pressure ulcer of sacral region, stage 1: Secondary | ICD-10-CM

## 2015-05-18 DIAGNOSIS — M25551 Pain in right hip: Secondary | ICD-10-CM

## 2015-05-18 DIAGNOSIS — M25511 Pain in right shoulder: Secondary | ICD-10-CM | POA: Diagnosis not present

## 2015-05-18 DIAGNOSIS — I639 Cerebral infarction, unspecified: Secondary | ICD-10-CM

## 2015-05-18 DIAGNOSIS — W1809XA Striking against other object with subsequent fall, initial encounter: Secondary | ICD-10-CM

## 2015-05-18 NOTE — Patient Instructions (Signed)
Medication Instructions:  The current medical regimen is effective;  continue present plan and medications.  Follow-Up: Follow up in 6 months with Dr. Hochrein.  You will receive a letter in the mail 2 months before you are due.  Please call us when you receive this letter to schedule your follow up appointment.  If you need a refill on your cardiac medications before your next appointment, please call your pharmacy.  Thank you for choosing [facility]!!       

## 2015-05-18 NOTE — Progress Notes (Signed)
HPI The patient presents for evaluation of CAD/CABG.   Since I last saw her she was in the hospital for a CVA wth acute ischemia left internal capsule/basal ganglia.  The etiology of this is not clear.  I reviewed the records from this hospitalization in Dec and her echo demonstrated a normal EF and she did not have a clear source of emboli.  She had mild carotid stenosis.  She has some weakness and speech disturbance.  She has had falls with one happening this morning.  She has not had syncope.  The patient denies any new symptoms such as chest discomfort, neck or arm discomfort. There has been no new shortness of breath, PND or orthopnea. There have been no reported palpitations, presyncope or syncope.   Allergies  Allergen Reactions  . Celebrex [Celecoxib] Other (See Comments)    Muscle aches and leg pains  . Fenofibrate Other (See Comments)    Muscle aches and leg pains  . Statins Other (See Comments)    Myalgias; muscle aches and leg pains    Current Outpatient Prescriptions  Medication Sig Dispense Refill  . aspirin EC 325 MG EC tablet Take 1 tablet (325 mg total) by mouth daily. 30 tablet 0  . atorvastatin (LIPITOR) 20 MG tablet TAKE 1 TABLET (20 MG TOTAL) BY MOUTH DAILY AT 6 PM. 30 tablet 4  . glipiZIDE (GLUCOTROL) 5 MG tablet TAKE 1 TABLET (5 MG TOTAL) BY MOUTH 2 (TWO) TIMES DAILY BEFORE A MEAL. 180 tablet 0  . ibuprofen (ADVIL,MOTRIN) 600 MG tablet Take 600 mg by mouth every 4 (four) hours as needed for moderate pain.     . Insulin Glargine (BASAGLAR KWIKPEN) 100 UNIT/ML Solostar Pen Inject 32-34 Units into the skin daily at 10 pm. Replaces Lantus (insulin glargine) due to insurance formulary change in 2017 15 mL 1  . levothyroxine (SYNTHROID) 88 MCG tablet Take 1 tablet (88 mcg total) by mouth daily before breakfast. 90 tablet 1  . meclizine (ANTIVERT) 25 MG tablet Take 25 mg by mouth 2 (two) times daily.    Marland Kitchen omeprazole (PRILOSEC) 20 MG capsule TAKE 1 CAPSULE (20 MG TOTAL) BY  MOUTH DAILY. 90 capsule 1  . penicillin v potassium (VEETID) 500 MG tablet Take 500 mg by mouth 2 (two) times daily.     . propranolol ER (INDERAL LA) 80 MG 24 hr capsule Take 1 capsule (80 mg total) by mouth daily. 30 capsule 0  . sitaGLIPtin-metformin (JANUMET) 50-1000 MG tablet Take 1 tablet by mouth 2 (two) times daily with a meal. 90 tablet 0  . traZODone (DESYREL) 50 MG tablet 1 to 2 tablets 30 minutes before bed as needed for difficulty sleeping 60 tablet 3  . glucose blood (ONE TOUCH ULTRA TEST) test strip Test one time a day and prn  Dx E11.9 100 each 12  . Insulin Pen Needle 32G X 4 MM MISC Use with insulin pen to dispense insulin daily 100 each 1  . insulin starter kit- pen needles MISC 1 kit by Other route once. 1 kit 0  . Lancets (ONETOUCH ULTRASOFT) lancets Test one time a day and prn  Dx e11.9 100 each 12   No current facility-administered medications for this visit.    Past Medical History  Diagnosis Date  . Tremor   . GERD (gastroesophageal reflux disease)   . Hyperlipidemia   . Allergy   . Diabetes mellitus type 2, controlled ([place])   . Hypertension     patient  denies ever having hypertension  . Personal history of colonic polyps 11/20/2010    tubular adenomas  . Hypothyroidism   . Anemia   . Skin cancer     ear, right side of cheek  . CAD (coronary artery disease) 11-2012    CABG x 4 utilizing LIMA to LAD, SVG to Diagonal, SVG to Left Circumflex, and SVG to RCA    Past Surgical History  Procedure Laterality Date  . Appendectomy    . Cholecystectomy    . Abdominal hysterectomy    . Knee arthroscopy      bilateral  . Carpal tunnel release    . Back surgery    . Coronary artery bypass graft N/A 11/26/2012    Procedure: CORONARY ARTERY BYPASS GRAFTING (CABG);  Surgeon: Grace Isaac, MD;  Location: [facility];  Service: Open Heart Surgery;  Laterality: N/A;  . Intraoperative transesophageal echocardiogram N/A 11/26/2012    Procedure: INTRAOPERATIVE TRANSESOPHAGEAL  ECHOCARDIOGRAM;  Surgeon: Grace Isaac, MD;  Location: [facility];  Service: Open Heart Surgery;  Laterality: N/A;  . Left heart catheterization with coronary angiogram N/A 11/25/2012    Procedure: LEFT HEART CATHETERIZATION WITH CORONARY ANGIOGRAM;  Surgeon: Larey Dresser, MD;  Location: [facility] CATH LAB;  Service: Cardiovascular;  Laterality: N/A;  . Loop recorder implant N/A 12/10/2013    Procedure: LOOP RECORDER IMPLANT;  Surgeon: Coralyn Mark, MD;  Location: [facility] CATH LAB;  Service: Cardiovascular;  Laterality: N/A;  . Neck surgery      ROS:  Back and hip pain .  Otherwise as stated in the HPI and negative for all other systems.  PHYSICAL EXAM BP 118/72 mmHg  Pulse 72  Ht 5' (1.524 m)  Wt 165 lb (74.844 kg)  BMI 32.22 kg/m2 GENERAL:  Well appearing NECK:  No jugular venous distention, waveform within normal limits, carotid upstroke brisk and symmetric, no bruits, no thyromegaly LUNGS:  Clear to auscultation bilaterally BACK:  No CVA tenderness CHEST:  Well healed sternotomy scar. HEART:  PMI not displaced or sustained,S1 and S2 within normal limits, no S3, no S4, no clicks, no rubs,  soft apical systolic murmur, no diastolicrs ABD:  Flat, positive bowel sounds normal in frequency in pitch, no bruits, no rebound, no guarding, no midline pulsatile mass, no hepatomegaly, no splenomegaly EXT:  2 plus pulses throughout, trace edema, no cyanosis no clubbing NEURO:  Nonfocal SKIN:  No rashes, no nodules   ASSESSMENT AND PLAN  CAD/CABG:  The patient has no new sypmtoms.  No further cardiovascular testing is indicated.  We will continue with aggressive risk reduction and meds as listed.  Of note she did have her beta blocker dose decreased in the hospital.    HTN:  The blood pressure is at target. No change in medications is indicated. We will continue with therapeutic lifestyle changes (TLC).  DYSLIPIDEMIA:      Per Kenn File, MD  SYNCOPE:  She has had no further episodes. Her loop  recorder was interrogated and I have reviewed these.  There is no dysrhythmia and in particular no atrial fib.    DM:  A1C was greater than 11 in the hospital.  She will follow with Kenn File, [facility] records reviewed.

## 2015-05-18 NOTE — Progress Notes (Addendum)
BP 128/68 mmHg  Pulse 64  Temp(Src) 97.2 F (36.2 C) (Oral)  Ht 5' (1.524 m)  Wt 166 lb 9.6 oz (75.569 kg)  BMI 32.54 kg/m2   Subjective:    Patient ID: Leah Levine, female    DOB: 08/19/1932, 82 y.o.   MRN: [id number]  HPI: Leah Levine is a 82 y.o. female presenting on 05/18/2015 for Right hip pain & head pain after falling this morning   HPI Fall against hard object Patient has no significant balance issues and was bending forward to try and pick something up off the ground and when she tried to get back up she overcorrected and fell onto the right side of her hip and buttocks and hit the side of her right shoulder/neck on a small lip from a dresser. This happened early this morning. She denies any lightheadedness or dizziness or blurred vision or chest pain. She denies any shortness of breath during that time. She denies any headaches. She just more has pain going down the right side of the musculature of her neck into her right shoulder. She also has pain on the right posterior hip.  Pressure ulcer of buttocks Patient has started to develop a pressure ulcer over her right buttocks in the past couple weeks. It is tender but there is been no drainage out of it. He is mostly reserved to skin breakdown. She denies any fevers or chills. She does have difficulty ambulating that is in either a chair or bed most of the day. She does have a walker to help her ambulate but she still has weakness that prevents that at times.  Relevant past medical, surgical, family and social history reviewed and updated as indicated. Interim medical history since our last visit reviewed. Allergies and medications reviewed and updated.  Review of Systems  Constitutional: Negative for fever and chills.  HENT: Negative for congestion, ear discharge and ear pain.   Eyes: Negative for redness and visual disturbance.  Respiratory: Negative for chest tightness and shortness of breath.     Cardiovascular: Negative for chest pain and leg swelling.  Genitourinary: Negative for dysuria and difficulty urinating.  Musculoskeletal: Positive for myalgias and arthralgias. Negative for back pain, joint swelling and gait problem.  Skin: Positive for wound. Negative for color change and rash.  Neurological: Negative for light-headedness and headaches.  Psychiatric/Behavioral: Negative for behavioral problems and agitation.  All other systems reviewed and are negative.   Per HPI unless specifically indicated above     Medication List       This list is accurate as of: 05/18/15  1:44 PM.  Always use your most recent med list.               aspirin 325 MG EC tablet  Take 1 tablet (325 mg total) by mouth daily.     atorvastatin 20 MG tablet  Commonly known as:  LIPITOR  TAKE 1 TABLET (20 MG TOTAL) BY MOUTH DAILY AT 6 PM.     glipiZIDE 5 MG tablet  Commonly known as:  GLUCOTROL  TAKE 1 TABLET (5 MG TOTAL) BY MOUTH 2 (TWO) TIMES DAILY BEFORE A MEAL.     glucose blood test strip  Commonly known as:  ONE TOUCH ULTRA TEST  Test one time a day and prn  Dx E11.9     ibuprofen 600 MG tablet  Commonly known as:  ADVIL,MOTRIN  Take 600 mg by mouth every 4 (four) hours as needed for moderate  pain.     Insulin Glargine 100 UNIT/ML Solostar Pen  Commonly known as:  BASAGLAR KWIKPEN  Inject 32-34 Units into the skin daily at 10 pm. Replaces Lantus (insulin glargine) due to insurance formulary change in 2017     Insulin Pen Needle 32G X 4 MM Misc  Use with insulin pen to dispense insulin daily     insulin starter kit- pen needles Misc  1 kit by Other route once.     levothyroxine 88 MCG tablet  Commonly known as:  SYNTHROID  Take 1 tablet (88 mcg total) by mouth daily before breakfast.     meclizine 25 MG tablet  Commonly known as:  ANTIVERT  Take 25 mg by mouth 2 (two) times daily.     omeprazole 20 MG capsule  Commonly known as:  PRILOSEC  TAKE 1 CAPSULE (20 MG TOTAL)  BY MOUTH DAILY.     onetouch ultrasoft lancets  Test one time a day and prn  Dx e11.9     penicillin v potassium 500 MG tablet  Commonly known as:  VEETID  Take 500 mg by mouth 2 (two) times daily.     propranolol ER 80 MG 24 hr capsule  Commonly known as:  INDERAL LA  Take 1 capsule (80 mg total) by mouth daily.     sitaGLIPtin-metformin 50-1000 MG tablet  Commonly known as:  JANUMET  Take 1 tablet by mouth 2 (two) times daily with a meal.     traZODone 50 MG tablet  Commonly known as:  DESYREL  1 to 2 tablets 30 minutes before bed as needed for difficulty sleeping           Objective:    BP 128/68 mmHg  Pulse 64  Temp(Src) 97.2 F (36.2 C) (Oral)  Ht 5' (1.524 m)  Wt 166 lb 9.6 oz (75.569 kg)  BMI 32.54 kg/m2  Wt Readings from Last 3 Encounters:  05/18/15 166 lb 9.6 oz (75.569 kg)  05/18/15 165 lb (74.844 kg)  05/05/15 166 lb 9.6 oz (75.569 kg)    Physical Exam  Constitutional: She is oriented to person, place, and time. She appears well-developed and well-nourished. No distress.  Eyes: Conjunctivae and EOM are normal. Pupils are equal, round, and reactive to light.  Cardiovascular: Normal rate, regular rhythm, normal heart sounds and intact distal pulses.   No murmur heard. Pulmonary/Chest: Effort normal and breath sounds normal. No respiratory distress. She has no wheezes.  Musculoskeletal: Normal range of motion. She exhibits no edema.       Right shoulder: She exhibits tenderness (Tenderness to palpation over the right superior shoulder musculature. No bony tenderness.). She exhibits normal range of motion, no bony tenderness, no swelling, no deformity and no laceration.       Legs: Neurological: She is alert and oriented to person, place, and time. Coordination normal.  Skin: Skin is warm and dry. Lesion noted. No rash noted. She is not diaphoretic.     Psychiatric: She has a normal mood and affect. Her behavior is normal.  Nursing note and vitals  reviewed.   Results for orders placed or performed in visit on 04/14/15  [facility]  Result Value Ref Range   Glucose 180 (H) 65 - 99 mg/dL   BUN 8 8 - 27 mg/dL   Creatinine, Ser 1.04 (H) 0.57 - 1.00 mg/dL   GFR calc non Af Amer 51 (L) >59 mL/min/1.73   GFR calc Af Amer 59 (L) >59 mL/min/1.73   BUN/Creatinine  Ratio 8 (L) 11 - 26   Sodium 139 134 - 144 mmol/L   Potassium 4.5 3.5 - 5.2 mmol/L   Chloride 99 96 - 106 mmol/L   CO2 25 18 - 29 mmol/L   Calcium 9.2 8.7 - 10.3 mg/dL   Hip x-ray: As I joint arthritis, no acute bony abnormalities noted. Await final read by radiology.  Shoulder x-ray: No acute bony abnormalities are visualized, await final read by radiologist.    Assessment & Plan:   Problem List Items Addressed This Visit    None    Visit Diagnoses    Stage I pressure ulcer of sacral region    -  Primary    Use topical ointment to protect the skin barrier and cleaning well. Return if worsens    Fall against object, initial encounter        Patient fell against a hard object in her bedroom from a standing position onto carpet and fell back against a little dresser. X-rays normal, likely contusion    Relevant Orders    DG Shoulder Right    DG HIP UNILAT W OR W/O PELVIS 2-3 VIEWS RIGHT        Follow up plan: Return if symptoms worsen or fail to improve.  Counseling provided for all of the vaccine components Orders Placed This Encounter  Procedures  . DG Shoulder Right  . DG HIP UNILAT W OR W/O PELVIS 2-3 VIEWS RIGHT    Caryl Pina, MD [facility] 05/18/2015, 1:44 PM

## 2015-05-22 ENCOUNTER — Other Ambulatory Visit: Payer: Self-pay | Admitting: Nurse Practitioner

## 2015-05-23 ENCOUNTER — Other Ambulatory Visit: Payer: Self-pay | Admitting: *Deleted

## 2015-05-24 ENCOUNTER — Ambulatory Visit: Payer: Federal, State, Local not specified - PPO | Admitting: Family Medicine

## 2015-05-25 LAB — CUP PACEART REMOTE DEVICE CHECK: Date Time Interrogation Session: 20170110010856

## 2015-05-25 NOTE — Progress Notes (Signed)
Carelink summary report received. Battery status OK. Normal device function. No new symptom episodes, tachy episodes, brady, or pause episodes. No new AF episodes. Monthly summary reports and ROV/PRN 

## 2015-05-26 ENCOUNTER — Other Ambulatory Visit: Payer: Self-pay | Admitting: Nurse Practitioner

## 2015-05-26 ENCOUNTER — Other Ambulatory Visit: Payer: Self-pay | Admitting: Pharmacist

## 2015-05-31 LAB — CUP PACEART REMOTE DEVICE CHECK: MDC IDC SESS DTM: 20170209013927

## 2015-05-31 NOTE — Progress Notes (Signed)
Carelink summary report received. Battery status OK. Normal device function. No new symptom episodes, tachy episodes, brady, or pause episodes. No new AF episodes. Monthly summary reports and ROV/PRN 

## 2015-06-02 ENCOUNTER — Telehealth: Payer: Self-pay | Admitting: Family Medicine

## 2015-06-02 NOTE — Telephone Encounter (Signed)
Appt made to see Dr. Wendi Snipes on Tues for possible UTI

## 2015-06-03 ENCOUNTER — Ambulatory Visit (INDEPENDENT_AMBULATORY_CARE_PROVIDER_SITE_OTHER): Payer: Federal, State, Local not specified - PPO | Admitting: *Deleted

## 2015-06-03 DIAGNOSIS — R55 Syncope and collapse: Secondary | ICD-10-CM | POA: Diagnosis not present

## 2015-06-06 NOTE — Progress Notes (Signed)
[facility] Summary Report / Loop Recorder 

## 2015-06-07 ENCOUNTER — Ambulatory Visit: Payer: Federal, State, Local not specified - PPO | Admitting: Family Medicine

## 2015-06-08 ENCOUNTER — Encounter: Payer: Self-pay | Admitting: Family Medicine

## 2015-06-15 ENCOUNTER — Ambulatory Visit: Payer: Federal, State, Local not specified - PPO | Admitting: Cardiology

## 2015-06-23 ENCOUNTER — Encounter: Payer: Self-pay | Admitting: Family Medicine

## 2015-06-23 ENCOUNTER — Ambulatory Visit (INDEPENDENT_AMBULATORY_CARE_PROVIDER_SITE_OTHER): Payer: Federal, State, Local not specified - PPO | Admitting: Family Medicine

## 2015-06-23 VITALS — BP 143/75 | HR 72 | Temp 97.6°F | Ht 61.52 in | Wt 166.6 lb

## 2015-06-23 DIAGNOSIS — G479 Sleep disorder, unspecified: Secondary | ICD-10-CM

## 2015-06-23 NOTE — Patient Instructions (Signed)
Great to see you!  Today try to skip naps Set a bedtime and lay down every night at that time, do not watch tv in your bedroom, do not read in bed  You may try 2 trazodone if you need it to help you sleep  Call or come back with any concerns  See the handout I gave you on sleep hygiene

## 2015-06-23 NOTE — Progress Notes (Signed)
   HPI  Patient presents today here with trouble sleeping  Pt explains she has had trouble sleeping for months but especially bad over th elast 3 days, she did not sleep last night She denies any mood d/o, anxiety, depression, Hx of bipolar, or mania She typically gets up to go to the bedroom and watch tv in bed at 11:00 she usually watches tv until 230 or 3 am, she sleeps until 730  She gets an afternoon nap usually around 2-3 hours  She drinks caffeine all day NO alcohol Has tried trazodone 50 mg with no help  She denies any signs of illness currently, cough sob, cough etc  PMH: Smoking status noted ROS: Per HPI  Objective: BP 143/75 mmHg  Pulse 72  Temp(Src) 97.6 F (36.4 C) (Oral)  Ht 5' 1.52" (1.563 m)  Wt 166 lb 9.6 oz (75.569 kg)  BMI 30.93 kg/m2 Gen: NAD, alert, cooperative with exam HEENT: NCAT CV: RRR, good S1/S2, no murmur Resp: CTABL, no wheezes, non-labored Ext: No edema, warm Neuro: Alert and oriented, No gross deficits  Assessment and plan:  # Difficulty sleeping Had a long conversation about sleep hygiene Specific reccs including no naps today, make a routine bedtime, np tv in bed, stop caffeine at 4oclcok Increase trazodone to 100 mg if needed RTC with any concerns   Laroy Apple, MD [facility] 06/23/2015, 9:36 AM

## 2015-07-04 ENCOUNTER — Ambulatory Visit (INDEPENDENT_AMBULATORY_CARE_PROVIDER_SITE_OTHER): Payer: Federal, State, Local not specified - PPO | Admitting: *Deleted

## 2015-07-04 DIAGNOSIS — R55 Syncope and collapse: Secondary | ICD-10-CM | POA: Diagnosis not present

## 2015-07-04 NOTE — Progress Notes (Signed)
[facility] Summary Report / Loop Recorder 

## 2015-08-02 ENCOUNTER — Ambulatory Visit (INDEPENDENT_AMBULATORY_CARE_PROVIDER_SITE_OTHER): Payer: Federal, State, Local not specified - PPO | Admitting: *Deleted

## 2015-08-02 DIAGNOSIS — R55 Syncope and collapse: Secondary | ICD-10-CM | POA: Diagnosis not present

## 2015-08-03 NOTE — Progress Notes (Signed)
[facility] Summary Report / Loop Recorder 

## 2015-08-06 ENCOUNTER — Other Ambulatory Visit: Payer: Self-pay | Admitting: Family

## 2015-08-07 ENCOUNTER — Other Ambulatory Visit: Payer: Self-pay | Admitting: Family Medicine

## 2015-08-17 ENCOUNTER — Institutional Professional Consult (permissible substitution): Payer: Federal, State, Local not specified - PPO | Admitting: Internal Medicine

## 2015-08-17 LAB — CUP PACEART REMOTE DEVICE CHECK: MDC IDC SESS DTM: 20170311013625

## 2015-08-21 LAB — CUP PACEART REMOTE DEVICE CHECK: MDC IDC SESS DTM: 20170410013547

## 2015-08-21 NOTE — Progress Notes (Signed)
Carelink summary report received. Battery status OK. Normal device function. No new symptom episodes, tachy episodes, brady, or pause episodes. No new AF episodes. Monthly summary reports and ROV/PRN 

## 2015-08-26 ENCOUNTER — Ambulatory Visit (INDEPENDENT_AMBULATORY_CARE_PROVIDER_SITE_OTHER): Payer: Federal, State, Local not specified - PPO

## 2015-08-26 ENCOUNTER — Ambulatory Visit (INDEPENDENT_AMBULATORY_CARE_PROVIDER_SITE_OTHER): Payer: Federal, State, Local not specified - PPO | Admitting: Family Medicine

## 2015-08-26 ENCOUNTER — Encounter: Payer: Self-pay | Admitting: Family Medicine

## 2015-08-26 VITALS — BP 148/77 | HR 72 | Temp 97.4°F | Ht 61.52 in | Wt 167.2 lb

## 2015-08-26 DIAGNOSIS — E118 Type 2 diabetes mellitus with unspecified complications: Secondary | ICD-10-CM

## 2015-08-26 DIAGNOSIS — Z794 Long term (current) use of insulin: Secondary | ICD-10-CM

## 2015-08-26 DIAGNOSIS — E1122 Type 2 diabetes mellitus with diabetic chronic kidney disease: Secondary | ICD-10-CM | POA: Diagnosis not present

## 2015-08-26 DIAGNOSIS — N183 Chronic kidney disease, stage 3 unspecified: Secondary | ICD-10-CM | POA: Insufficient documentation

## 2015-08-26 DIAGNOSIS — E785 Hyperlipidemia, unspecified: Secondary | ICD-10-CM

## 2015-08-26 DIAGNOSIS — M6248 Contracture of muscle, other site: Secondary | ICD-10-CM

## 2015-08-26 DIAGNOSIS — M62838 Other muscle spasm: Secondary | ICD-10-CM

## 2015-08-26 DIAGNOSIS — E1165 Type 2 diabetes mellitus with hyperglycemia: Secondary | ICD-10-CM | POA: Diagnosis not present

## 2015-08-26 DIAGNOSIS — IMO0002 Reserved for concepts with insufficient information to code with codable children: Secondary | ICD-10-CM

## 2015-08-26 LAB — BAYER DCA HB A1C WAIVED: HB A1C: 7.5 % — AB (ref <7.0)

## 2015-08-26 MED ORDER — DICLOFENAC SODIUM 1 % TD GEL
4.0000 g | Freq: Four times a day (QID) | TRANSDERMAL | Status: DC
Start: 2015-08-26 — End: 2016-03-07

## 2015-08-26 MED ORDER — BACLOFEN 10 MG PO TABS: ORAL_TABLET | ORAL | Status: DC

## 2015-08-26 NOTE — Patient Instructions (Signed)
Great to see you!  I am starting baclofen for neck pain Take 1/2 pill twice daily for 1 week, then 1 pill twice daily If you are not getting better you may go up to 2 pills twice daily  Stop aleve, try tylenol 1-2 pills 3 times daily  Try the voltaren gel I have prescribed as well  We will call with results

## 2015-08-26 NOTE — Addendum Note (Signed)
Addended by: Timmothy Euler on: 08/26/2015 01:14 PM   Modules accepted: Miquel Dunn

## 2015-08-26 NOTE — Progress Notes (Addendum)
   HPI  Patient presents today here with neck pain.  Neck pain Has been going on for about 6 months, started after her stroke No injury States that she has a right sided paraspinal muscle pain between her neck and shoulder, it's nonradiating, it hurts with head turned to the right, to the left, up, or down. She's been trying Aleve without much improvement.  Patient states that she fell couple of times in February, he got worse at this time. Recent falls.  Diabetes Not taking insulin at all, she is taking glipizide twice daily, Janumet twice daily States that it is life-threatening she would be willing to take insulin shots.   PMH: Smoking status noted ROS: Per HPI  Objective: BP 148/77 mmHg  Pulse 72  Temp(Src) 97.4 F (36.3 C) (Oral)  Ht 5' 1.52" (1.563 m)  Wt 167 lb 3.2 oz (75.841 kg)  BMI 31.04 kg/m2 Gen: NAD, alert, cooperative with exam HEENT: NCAT CV: RRR, good S1/S2, no murmur Resp: CTABL, no wheezes, non-labored Ext: No edema, warm Neuro: Alert and oriented, No gross deficits  MSK:  Limited range of motion to about 30-40 of head turn to the right, left, up, or rebound. Tenderness to palpation of trapezius on the right side between shoulder and neck, tenderness to palpation over the SCM Tenderness with left port head movement against force, this would stress the right-sided SCM  DG cervical spine Narrowing of disc space at C6/C7, no acute findings  Assessment and plan:  # Muscle spasm of the neck Unclear why she is having such severe muscle spasms, I have started her on baclofen Stop NSAIDs Start Tylenol for pain Also Voltaren gel Follow-up in one month, titrating baclofen relatively aggressively given symptoms. Her creatinine clearance is 52 today  # Type 2 diabetes Noncompliant with medication, I have asked her to follow-up in one week with clinical pharmacist for diabetic education Continue glipizide, Janumet for now A1c on the way out, will  follow-up by phone and with clinical pharmacist next week I suspect that her control has gotten worse since her last A1c of 11 six months ago  # Hyperlipidemia Labs Continue statin Discussed diet, I am more concerned about her carbohydrate intake then her fatty food intake at this time  CKD stage 3 Avoiding NSAIDs Monitor, labs   Orders Placed This Encounter  Procedures  . DG Cervical Spine 2 or 3 views    Standing Status: Future     Number of Occurrences: 1     Standing Expiration Date: 08/25/2016    Order Specific Question:  Reason for Exam (SYMPTOM  OR DIAGNOSIS REQUIRED)    Answer:  neck pain for 6 months    Order Specific Question:  Preferred imaging location?    Answer:  External  . CMP14+EGFR  . Bayer DCA Hb A1c Waived    Meds ordered this encounter  Medications  . baclofen (LIORESAL) 10 MG tablet    Sig: 1/2 pill twice daily for 1 week, then 1 pill twice daily for 1 week, increase to 2 pills twice daily in 1 week if no improvement    Dispense:  80 each    Refill:  [place], MD [facility] 08/26/2015, 12:24 PM

## 2015-08-27 LAB — CMP14+EGFR
ALT: 9 IU/L (ref 0–32)
AST: 18 IU/L (ref 0–40)
Albumin/Globulin Ratio: 1.4 (ref 1.2–2.2)
Albumin: 4.2 g/dL (ref 3.5–4.7)
Alkaline Phosphatase: 70 IU/L (ref 39–117)
BUN/Creatinine Ratio: 13 (ref 12–28)
BUN: 14 mg/dL (ref 8–27)
Bilirubin Total: 0.3 mg/dL (ref 0.0–1.2)
CALCIUM: 9.6 mg/dL (ref 8.7–10.3)
CO2: 24 mmol/L (ref 18–29)
CREATININE: 1.07 mg/dL — AB (ref 0.57–1.00)
Chloride: 95 mmol/L — ABNORMAL LOW (ref 96–106)
GFR calc Af Amer: 57 mL/min/{1.73_m2} — ABNORMAL LOW (ref >59)
GFR, EST NON AFRICAN AMERICAN: 49 mL/min/{1.73_m2} — AB (ref >59)
Globulin, Total: 3.1 g/dL (ref 1.5–4.5)
Glucose: 267 mg/dL — ABNORMAL HIGH (ref 65–99)
POTASSIUM: 4.7 mmol/L (ref 3.5–5.2)
Sodium: 135 mmol/L (ref 134–144)
Total Protein: 7.3 g/dL (ref 6.0–8.5)

## 2015-08-27 LAB — LIPID PANEL
CHOL/HDL RATIO: 6.6 ratio — AB (ref 0.0–4.4)
Cholesterol, Total: 244 mg/dL — ABNORMAL HIGH (ref 100–199)
HDL: 37 mg/dL — AB (ref >39)
TRIGLYCERIDES: 449 mg/dL — AB (ref 0–149)

## 2015-09-01 ENCOUNTER — Ambulatory Visit (INDEPENDENT_AMBULATORY_CARE_PROVIDER_SITE_OTHER): Payer: Federal, State, Local not specified - PPO | Admitting: *Deleted

## 2015-09-01 DIAGNOSIS — R55 Syncope and collapse: Secondary | ICD-10-CM | POA: Diagnosis not present

## 2015-09-02 NOTE — Progress Notes (Signed)
[facility] Summary Report / Loop Recorder 

## 2015-09-05 ENCOUNTER — Encounter: Payer: Self-pay | Admitting: Family Medicine

## 2015-09-05 ENCOUNTER — Ambulatory Visit (INDEPENDENT_AMBULATORY_CARE_PROVIDER_SITE_OTHER): Payer: Federal, State, Local not specified - PPO | Admitting: Family Medicine

## 2015-09-05 VITALS — BP 145/72 | HR 69 | Temp 97.1°F | Ht 61.52 in | Wt 167.4 lb

## 2015-09-05 DIAGNOSIS — E781 Pure hyperglyceridemia: Secondary | ICD-10-CM

## 2015-09-05 DIAGNOSIS — M62838 Other muscle spasm: Secondary | ICD-10-CM

## 2015-09-05 DIAGNOSIS — N183 Chronic kidney disease, stage 3 (moderate): Secondary | ICD-10-CM | POA: Diagnosis not present

## 2015-09-05 DIAGNOSIS — E118 Type 2 diabetes mellitus with unspecified complications: Secondary | ICD-10-CM | POA: Diagnosis not present

## 2015-09-05 DIAGNOSIS — E785 Hyperlipidemia, unspecified: Secondary | ICD-10-CM | POA: Diagnosis not present

## 2015-09-05 DIAGNOSIS — E1122 Type 2 diabetes mellitus with diabetic chronic kidney disease: Secondary | ICD-10-CM

## 2015-09-05 DIAGNOSIS — IMO0002 Reserved for concepts with insufficient information to code with codable children: Secondary | ICD-10-CM

## 2015-09-05 DIAGNOSIS — Z794 Long term (current) use of insulin: Secondary | ICD-10-CM

## 2015-09-05 DIAGNOSIS — E1165 Type 2 diabetes mellitus with hyperglycemia: Secondary | ICD-10-CM | POA: Diagnosis not present

## 2015-09-05 DIAGNOSIS — M6249 Contracture of muscle, multiple sites: Secondary | ICD-10-CM | POA: Diagnosis not present

## 2015-09-05 MED ORDER — ICOSAPENT ETHYL 1 G PO CAPS: 2.0000 g | ORAL_CAPSULE | Freq: Two times a day (BID) | ORAL | Status: DC

## 2015-09-05 MED ORDER — ATORVASTATIN CALCIUM 40 MG PO TABS: 40.0000 mg | ORAL_TABLET | Freq: Every day | ORAL | Status: DC

## 2015-09-05 NOTE — Patient Instructions (Signed)
Great to see you!  I am glad you are doing well, I am glad your neck is getting better.  Please come back in 3 weeks to see how the pain is.   Lets see you for diabetes again in 3 months

## 2015-09-05 NOTE — Progress Notes (Signed)
   HPI  Patient presents today for follow-up.  Patient was seen last week with severe neck spasm. It's improved quite a bit on baclofen, she's going to go up on dose as discussed. She has improvement but not complete resolution. She has history of neck surgery. No injury.  Diabetes Not taking insulin However she is taking all of her oral medications Not checking blood sugars or watching her diet Has no complaints about the oral medications.  Hyperlipidemia, hypertriglyceridemia She was fasting last visit Taking statin daily  PMH: Smoking status noted ROS: Per HPI  Objective: BP 145/72 mmHg  Pulse 69  Temp(Src) 97.1 F (36.2 C) (Oral)  Ht 5' 1.52" (1.563 m)  Wt 167 lb 6.4 oz (75.932 kg)  BMI 31.08 kg/m2 Gen: NAD, alert, cooperative with exam HEENT: NCAT CV: RRR, good S1/S2, no murmur Resp: CTABL, no wheezes, non-labored Abd: SNTND, BS present, no guarding or organomegaly Ext: No edema, warm Neuro: Alert and oriented, No gross deficits  Musculoskeletal Tenderness to palpation of the muscle belly of the trapezius on the right side between shoulder and neck, seems to be improved from last visit, range of motion is approximately 50 on the right and 80 on the left  Assessment and plan:  # Muscle spasm, neck pain Improving on baclofen Continue titrate as discussed Follow-up in 3 weeks Clearance is 50  # Type 2 diabetes Surprisingly well controlled considering complete insulin noncompliance Continue Janumet and glipizide Discontinue insulin, hasnt taken in 5 months, previously 32 units daily basaglar She's borderline for Januvia and metformin on her renal function, her creatinine clearance is 50. Monitor every 3 months  # Hypertriglyceridemia Start Vascepa Repeat in 3 months  # Hyperlipidemia Considering total cholesterol 244 IV increase Lipitor to 40 mg, tolerating well Repeat labs in 3 months Ldl goal less than 70     Meds ordered this encounter    Medications  . atorvastatin (LIPITOR) 40 MG tablet    Sig: Take 1 tablet (40 mg total) by mouth daily.    Dispense:  90 tablet    Refill:  3  . Icosapent Ethyl (VASCEPA) 1 g CAPS    Sig: Take 2 g by mouth 2 (two) times daily.    Dispense:  120 capsule    Refill:  [place], MD [facility] 09/05/2015, 10:00 AM

## 2015-09-09 LAB — CUP PACEART REMOTE DEVICE CHECK
Date Time Interrogation Session: 20170510020932
Date Time Interrogation Session: 20170609020918

## 2015-09-14 ENCOUNTER — Other Ambulatory Visit: Payer: Self-pay | Admitting: Family Medicine

## 2015-09-14 MED ORDER — MECLIZINE HCL 25 MG PO TABS: 25.0000 mg | ORAL_TABLET | Freq: Two times a day (BID) | ORAL | Status: DC

## 2015-09-14 NOTE — Telephone Encounter (Signed)
Cannot refill hydrocodone over the phone and I do not recommend using after age 80 for routine use.   Laroy Apple, MD [facility] 09/14/2015, 12:14 PM

## 2015-09-14 NOTE — Telephone Encounter (Signed)
I do not see hydrocodone on list? Route to pool with response

## 2015-09-21 NOTE — Telephone Encounter (Signed)
Pt given appt with Dr.Bradshaw 6/30.

## 2015-09-23 ENCOUNTER — Ambulatory Visit: Payer: Federal, State, Local not specified - PPO | Admitting: Family Medicine

## 2015-09-26 ENCOUNTER — Encounter: Payer: Self-pay | Admitting: Family Medicine

## 2015-10-03 ENCOUNTER — Ambulatory Visit (INDEPENDENT_AMBULATORY_CARE_PROVIDER_SITE_OTHER): Payer: Self-pay | Admitting: *Deleted

## 2015-10-03 DIAGNOSIS — R55 Syncope and collapse: Secondary | ICD-10-CM

## 2015-10-03 NOTE — Progress Notes (Signed)
[facility] Summary Report / Loop Recorder 

## 2015-10-07 ENCOUNTER — Ambulatory Visit (INDEPENDENT_AMBULATORY_CARE_PROVIDER_SITE_OTHER): Payer: Federal, State, Local not specified - PPO | Admitting: Family Medicine

## 2015-10-07 ENCOUNTER — Ambulatory Visit (INDEPENDENT_AMBULATORY_CARE_PROVIDER_SITE_OTHER): Payer: Federal, State, Local not specified - PPO

## 2015-10-07 ENCOUNTER — Encounter: Payer: Self-pay | Admitting: Family Medicine

## 2015-10-07 VITALS — BP 126/70 | HR 70 | Temp 97.2°F | Ht 61.52 in | Wt 170.2 lb

## 2015-10-07 DIAGNOSIS — M79644 Pain in right finger(s): Secondary | ICD-10-CM

## 2015-10-07 DIAGNOSIS — W19XXXA Unspecified fall, initial encounter: Secondary | ICD-10-CM | POA: Diagnosis not present

## 2015-10-07 DIAGNOSIS — M542 Cervicalgia: Secondary | ICD-10-CM | POA: Diagnosis not present

## 2015-10-07 NOTE — Progress Notes (Signed)
BP 126/70 mmHg  Pulse 70  Temp(Src) 97.2 F (36.2 C) (Oral)  Ht 5' 1.52" (1.563 m)  Wt 170 lb 3.2 oz (77.202 kg)  BMI 31.60 kg/m2   Subjective:    Patient ID: Leah Levine, female    DOB: 10-24-1932, 82 y.o.   MRN: [id number]  HPI: Leah Levine is a 82 y.o. female presenting on 10/07/2015 for Fall   HPI Fall and neck pain and finger pain and shoulder pain Patient had a fall at home alone and fell back onto the back of her head and hit against the wall on the back of her head. This happened earlier today and she is being brought here by her daughter currently. Her daughter says she may have been down for like a half hour 40 minutes before she found her. She has had other falls recently and most recently she actually had a pelvic fracture when she fell. She feels very unstable and because daughter works and grandson who she lives with also works there are a lot of issues with her being home alone for a good portion of the day. Discussed home health care in assisted living facilities and patient is more willing to try for home health care and see what she can get. The pain that she is having is 8 out of 10 and is bilateral cervical midline and extending down into both sides of her shoulder. She is also having pain in her right finger because she feels like she twisted it when she went down. She denies any loss of range of motion or numbness or weakness in either leg or either hand.  Relevant past medical, surgical, family and social history reviewed and updated as indicated. Interim medical history since our last visit reviewed. Allergies and medications reviewed and updated.  Review of Systems  Constitutional: Negative for fever and chills.  HENT: Negative for congestion, ear discharge and ear pain.   Eyes: Negative for redness and visual disturbance.  Respiratory: Negative for chest tightness and shortness of breath.   Cardiovascular: Negative for chest pain and leg  swelling.  Genitourinary: Negative for dysuria and difficulty urinating.  Musculoskeletal: Positive for myalgias, back pain, arthralgias and neck pain. Negative for gait problem.  Skin: Negative for rash.  Neurological: Negative for dizziness, weakness, light-headedness, numbness and headaches.  Psychiatric/Behavioral: Negative for behavioral problems and agitation.  All other systems reviewed and are negative.   Per HPI unless specifically indicated above     Medication List       This list is accurate as of: 10/07/15 12:58 PM.  Always use your most recent med list.               aspirin 325 MG EC tablet  Take 1 tablet (325 mg total) by mouth daily.     atorvastatin 40 MG tablet  Commonly known as:  LIPITOR  Take 1 tablet (40 mg total) by mouth daily.     baclofen 10 MG tablet  Commonly known as:  LIORESAL  1/2 pill twice daily for 1 week, then 1 pill twice daily for 1 week, increase to 2 pills twice daily in 1 week if no improvement     diclofenac sodium 1 % Gel  Commonly known as:  VOLTAREN  Apply 4 g topically 4 (four) times daily.     glipiZIDE 5 MG tablet  Commonly known as:  GLUCOTROL  TAKE 1 TABLET (5 MG TOTAL) BY MOUTH 2 (TWO) TIMES DAILY BEFORE  A MEAL.     glucose blood test strip  Commonly known as:  ONE TOUCH ULTRA TEST  Test one time a day and prn  Dx E11.9     Icosapent Ethyl 1 g Caps  Commonly known as:  VASCEPA  Take 2 g by mouth 2 (two) times daily.     Insulin Pen Needle 32G X 4 MM Misc  Use with insulin pen to dispense insulin daily     insulin starter kit- pen needles Misc  1 kit by Other route once.     JANUMET 50-1000 MG tablet  Generic drug:  sitaGLIPtin-metformin  TAKE 1 TABLET TWICE A DAY WITH A MEAL     levothyroxine 88 MCG tablet  Commonly known as:  SYNTHROID  Take 1 tablet (88 mcg total) by mouth daily before breakfast.     meclizine 25 MG tablet  Commonly known as:  ANTIVERT  Take 1 tablet (25 mg total) by mouth 2 (two)  times daily.     omeprazole 20 MG capsule  Commonly known as:  PRILOSEC  TAKE 1 CAPSULE (20 MG TOTAL) BY MOUTH DAILY.     onetouch ultrasoft lancets  Test one time a day and prn  Dx e11.9     propranolol ER 80 MG 24 hr capsule  Commonly known as:  INDERAL LA  Take 1 capsule (80 mg total) by mouth daily.     traZODone 50 MG tablet  Commonly known as:  DESYREL  1 to 2 tablets 30 minutes before bed as needed for difficulty sleeping           Objective:    BP 126/70 mmHg  Pulse 70  Temp(Src) 97.2 F (36.2 C) (Oral)  Ht 5' 1.52" (1.563 m)  Wt 170 lb 3.2 oz (77.202 kg)  BMI 31.60 kg/m2  Wt Readings from Last 3 Encounters:  10/07/15 170 lb 3.2 oz (77.202 kg)  09/05/15 167 lb 6.4 oz (75.932 kg)  08/26/15 167 lb 3.2 oz (75.841 kg)    Physical Exam  Constitutional: She is oriented to person, place, and time. She appears well-developed and well-nourished. No distress.  Eyes: Conjunctivae and EOM are normal. Pupils are equal, round, and reactive to light.  Neck: Neck supple. No thyromegaly present.  Cardiovascular: Normal rate, regular rhythm, normal heart sounds and intact distal pulses.   No murmur heard. Pulmonary/Chest: Effort normal and breath sounds normal. No respiratory distress. She has no wheezes.  Musculoskeletal: Normal range of motion. She exhibits no edema or tenderness.  Lymphadenopathy:    She has no cervical adenopathy.  Neurological: She is alert and oriented to person, place, and time. Coordination normal.  Skin: Skin is warm and dry. No rash noted. She is not diaphoretic.  Psychiatric: She has a normal mood and affect. Her behavior is normal.  Nursing note and vitals reviewed.  X-ray cervical spine: Read by radiologist as no acute fractures.  X-ray right index finger: Read by radiologist as degenerative changes but no acute fracture.    Assessment & Plan:   Problem List Items Addressed This Visit    None    Visit Diagnoses    Finger pain, right    -   Primary    Relevant Orders    Home Health    Face-to-face encounter (required for Medicare/Medicaid patients)    DG Finger Index Right (Completed)    Neck pain        Relevant Orders    Home Health  Face-to-face encounter (required for Medicare/Medicaid patients)    DG Cervical Spine Complete (Completed)    Fall, initial encounter        Relevant Orders    Home Health    Face-to-face encounter (required for Medicare/Medicaid patients)       Follow up plan: Return if symptoms worsen or fail to improve.  Counseling provided for all of the vaccine components Orders Placed This Encounter  Procedures  . DG Finger Index Right  . DG Cervical Spine Complete  . Home Health  . Face-to-face encounter (required for Medicare/Medicaid patients)    Caryl Pina, MD [facility] 10/07/2015, 12:58 PM

## 2015-10-17 ENCOUNTER — Encounter: Payer: Self-pay | Admitting: Gastroenterology

## 2015-10-21 ENCOUNTER — Telehealth: Payer: Self-pay | Admitting: Cardiology

## 2015-10-21 NOTE — Telephone Encounter (Signed)
Spoke w/ pt and requested that she send a manual transmission b/c her home monitor has not updated in at least 14 days.   

## 2015-10-31 ENCOUNTER — Ambulatory Visit (INDEPENDENT_AMBULATORY_CARE_PROVIDER_SITE_OTHER): Payer: Self-pay | Admitting: *Deleted

## 2015-10-31 DIAGNOSIS — R55 Syncope and collapse: Secondary | ICD-10-CM

## 2015-11-01 NOTE — Progress Notes (Signed)
[facility] Summary Report / Loop Recorder 

## 2015-11-02 NOTE — Progress Notes (Signed)
HPI The patient presents for evaluation of CAD/CABG.  She had a CVA prior to the last office appt.    She returns for follow up.  Since I last saw her she has done well.  She is limited by her mobility.  She has chronic joint pain including hip problems.  She does report that she had a fall. She said this was in July. She ran into some drawers that were pulled out on a dresser. She said she did not lose consciousness leading to the fall. There was some vague description of loss of consciousness afterward but she refused any further evaluation and has had no apparent residual. She does have residual weakness and speech deficits from her stroke. She walks with a cane. She's not noticing any palpitations, presyncope or syncope. She's had no chest discomfort, neck or arm discomfort. Of note her implanted loop recorder has not demonstrated any arrhythmias.  Allergies  Allergen Reactions  . Celebrex [Celecoxib] Other (See Comments)    Muscle aches and leg pains  . Fenofibrate Other (See Comments)    Muscle aches and leg pains  . Statins Other (See Comments)    Myalgias; muscle aches and leg pains    Current Outpatient Prescriptions  Medication Sig Dispense Refill  . aspirin EC 325 MG EC tablet Take 1 tablet (325 mg total) by mouth daily. 30 tablet 0  . atorvastatin (LIPITOR) 40 MG tablet Take 1 tablet (40 mg total) by mouth daily. 90 tablet 3  . baclofen (LIORESAL) 10 MG tablet 1/2 pill twice daily for 1 week, then 1 pill twice daily for 1 week, increase to 2 pills twice daily in 1 week if no improvement 80 each 1  . diclofenac sodium (VOLTAREN) 1 % GEL Apply 4 g topically 4 (four) times daily. 100 g 3  . glipiZIDE (GLUCOTROL) 5 MG tablet TAKE 1 TABLET (5 MG TOTAL) BY MOUTH 2 (TWO) TIMES DAILY BEFORE A MEAL. 180 tablet 0  . glucose blood (ONE TOUCH ULTRA TEST) test strip Test one time a day and prn  Dx E11.9 100 each 12  . Icosapent Ethyl (VASCEPA) 1 g CAPS Take 2 g by mouth 2 (two) times daily.  120 capsule 11  . Insulin Pen Needle 32G X 4 MM MISC Use with insulin pen to dispense insulin daily 100 each 1  . insulin starter kit- pen needles MISC 1 kit by Other route once. 1 kit 0  . JANUMET 50-1000 MG tablet TAKE 1 TABLET TWICE A DAY WITH A MEAL 90 tablet 0  . Lancets (ONETOUCH ULTRASOFT) lancets Test one time a day and prn  Dx e11.9 100 each 12  . levothyroxine (SYNTHROID) 88 MCG tablet Take 1 tablet (88 mcg total) by mouth daily before breakfast. 90 tablet 1  . meclizine (ANTIVERT) 25 MG tablet Take 1 tablet (25 mg total) by mouth 2 (two) times daily. 30 tablet 0  . omeprazole (PRILOSEC) 20 MG capsule TAKE 1 CAPSULE (20 MG TOTAL) BY MOUTH DAILY. 90 capsule 1  . propranolol ER (INDERAL LA) 80 MG 24 hr capsule Take 1 capsule (80 mg total) by mouth daily. 30 capsule 0  . traZODone (DESYREL) 50 MG tablet 1 to 2 tablets 30 minutes before bed as needed for difficulty sleeping 60 tablet 3   No current facility-administered medications for this visit.     Past Medical History:  Diagnosis Date  . Allergy   . Anemia   . CAD (coronary artery disease) 11-2012  CABG x 4 utilizing LIMA to LAD, SVG to Diagonal, SVG to Left Circumflex, and SVG to RCA  . Diabetes mellitus type 2, controlled ([place])   . GERD (gastroesophageal reflux disease)   . Hyperlipidemia   . Hypertension    patient denies ever having hypertension  . Hypothyroidism   . Personal history of colonic polyps 11/20/2010   tubular adenomas  . Skin cancer    ear, right side of cheek  . Stroke ([place])   . Tremor     Past Surgical History:  Procedure Laterality Date  . ABDOMINAL HYSTERECTOMY    . APPENDECTOMY    . BACK SURGERY    . CARPAL TUNNEL RELEASE    . CHOLECYSTECTOMY    . CORONARY ARTERY BYPASS GRAFT N/A 11/26/2012   Procedure: CORONARY ARTERY BYPASS GRAFTING (CABG);  Surgeon: Grace Isaac, MD;  Location: [facility];  Service: Open Heart Surgery;  Laterality: N/A;  . INTRAOPERATIVE TRANSESOPHAGEAL ECHOCARDIOGRAM N/A  11/26/2012   Procedure: INTRAOPERATIVE TRANSESOPHAGEAL ECHOCARDIOGRAM;  Surgeon: Grace Isaac, MD;  Location: [facility];  Service: Open Heart Surgery;  Laterality: N/A;  . KNEE ARTHROSCOPY     bilateral  . LEFT HEART CATHETERIZATION WITH CORONARY ANGIOGRAM N/A 11/25/2012   Procedure: LEFT HEART CATHETERIZATION WITH CORONARY ANGIOGRAM;  Surgeon: Larey Dresser, MD;  Location: [facility] C[facility] LAB;  Service: Cardiovascular;  Laterality: N/A;  . LOOP RECORDER IMPLANT N/A 12/10/2013   Procedure: LOOP RECORDER IMPLANT;  Surgeon: Coralyn Mark, MD;  Location: [facility] CATH LAB;  Service: Cardiovascular;  Laterality: N/A;  . NECK SURGERY      ROS:  Back and hip pain .  Otherwise as stated in the HPI and negative for all other systems.  PHYSICAL EXAM BP (!) 142/82   Pulse 86   Ht 5' (1.524 m)   Wt 172 lb (78 kg)   SpO2 97%   BMI 33.59 kg/m  GENERAL:  Well appearing NECK:  No jugular venous distention, waveform within normal limits, carotid upstroke brisk and symmetric, no bruits, no thyromegaly LUNGS:  Clear to auscultation bilaterally CHEST:  Well healed sternotomy scar. HEART:  PMI not displaced or sustained,S1 and S2 within normal limits, no S3, no S4, no clicks, no rubs,  soft apical systolic murmur, no diastolicrs ABD:  Flat, positive bowel sounds normal in frequency in pitch, no bruits, no rebound, no guarding, no midline pulsatile mass, no hepatomegaly, no splenomegaly EXT:  2 plus pulses throughout, trace edema, no cyanosis no clubbing   ASSESSMENT AND PLAN  CAD/CABG:  The patient has no new sypmtoms.  No further cardiovascular testing is indicated.  We will continue with aggressive risk reduction and meds as listed.   HTN:  The blood pressure is at target. No change in medications is indicated. We will continue with therapeutic lifestyle changes (TLC).  DYSLIPIDEMIA:      Per Kenn File, MD  SYNCOPE:    Her implanted loop recorder has not identified any arrhythmias.  She had a fall in July  but after careful questioning this was not syncope.  I will ask the device clinic for the latest update from her Linq.    DM:  A1C was 7.5 which is down from 11!  She will follow with Kenn File, MD

## 2015-11-03 ENCOUNTER — Encounter: Payer: Self-pay | Admitting: Cardiology

## 2015-11-03 ENCOUNTER — Ambulatory Visit (INDEPENDENT_AMBULATORY_CARE_PROVIDER_SITE_OTHER): Payer: Medicare Other | Admitting: Cardiology

## 2015-11-03 VITALS — BP 142/82 | HR 86 | Ht 60.0 in | Wt 172.0 lb

## 2015-11-03 DIAGNOSIS — I2510 Atherosclerotic heart disease of native coronary artery without angina pectoris: Secondary | ICD-10-CM

## 2015-11-03 DIAGNOSIS — I639 Cerebral infarction, unspecified: Secondary | ICD-10-CM

## 2015-11-03 LAB — CUP PACEART REMOTE DEVICE CHECK: MDC IDC SESS DTM: 20170709030909

## 2015-11-03 NOTE — Patient Instructions (Signed)
Medication Instructions:  Continue all current medications.  Labwork: none  Testing/Procedures: none  Follow-Up: Your physician wants you to follow up in:  1 year.  You will receive a reminder letter in the mail one-two months in advance.  If you don't receive a letter, please call our office to schedule the follow up appointment - [place].   Any Other Special Instructions Will Be Listed Below (If Applicable).  If you need a refill on your cardiac medications before your next appointment, please call your pharmacy.

## 2015-11-07 LAB — CUP PACEART REMOTE DEVICE CHECK: MDC IDC SESS DTM: 20170808031011

## 2015-11-10 ENCOUNTER — Ambulatory Visit (INDEPENDENT_AMBULATORY_CARE_PROVIDER_SITE_OTHER): Payer: Federal, State, Local not specified - PPO | Admitting: Family Medicine

## 2015-11-10 ENCOUNTER — Encounter: Payer: Self-pay | Admitting: Family Medicine

## 2015-11-10 VITALS — BP 124/73 | HR 78 | Temp 97.9°F | Ht 60.0 in | Wt 173.0 lb

## 2015-11-10 DIAGNOSIS — R296 Repeated falls: Secondary | ICD-10-CM | POA: Insufficient documentation

## 2015-11-10 DIAGNOSIS — M62838 Other muscle spasm: Secondary | ICD-10-CM

## 2015-11-10 DIAGNOSIS — I6930 Unspecified sequelae of cerebral infarction: Secondary | ICD-10-CM | POA: Insufficient documentation

## 2015-11-10 DIAGNOSIS — I6990 Unspecified sequelae of unspecified cerebrovascular disease: Secondary | ICD-10-CM

## 2015-11-10 DIAGNOSIS — M4802 Spinal stenosis, cervical region: Secondary | ICD-10-CM | POA: Insufficient documentation

## 2015-11-10 DIAGNOSIS — M6248 Contracture of muscle, other site: Secondary | ICD-10-CM

## 2015-11-10 NOTE — Patient Instructions (Signed)
Great to see you!  Try baclofen, home health will call you about physical therapy

## 2015-11-10 NOTE — Progress Notes (Signed)
HPI  Patient presents today here with frequent falls, neck pain, right lower extremity weakness after a stroke.  She explains that over the last 3 months or so she's had worsening right lower extremity coordination and subjective weakness. She states that she nearly falls several times a week had several falls in the last few months. She also complains of severe neck muscle tightness and pain with moving her neck to the right or to the left. She denies any fever, chills, sweats, new injuries, or recent severe changes.  Her daughter with her out of the room explains that she seems to be doing much worse, she seems to have lower appetite.   PMH: Smoking status noted ROS: Per HPI  Objective: BP 124/73   Pulse 78   Temp 97.9 F (36.6 C) (Oral)   Ht 5' (1.524 m)   Wt 173 lb (78.5 kg)   BMI 33.79 kg/m  Gen: NAD, alert, cooperative with exam HEENT: NCAT CV: RRR, good S1/S2, no murmur Resp: CTABL, no wheezes, non-labored Ext: No edema, warm Neuro: Alert and oriented, No gross deficits  Msk: Limited range of motion of the neck turning her head approximately 30 to the right and 20 to the left, can touch her chin to her chest, also can look up approximate 60  Assessment and plan:  # Late effects of CVA Decreased coordination and weakness of the right lower extremity Likely leading to her frequent falls I recommended and ordered home health physical therapy today   # Frequent falls Likely due to review of CVA See above  # Neck pain and neck muscle spasm, foraminal stenosis of the cervical region After previous neck surgery several years ago, no new injuries Patient with severely limited range of motion and signs of severe arthritic changes of the neck on x-ray last month. Recommended over-the-counter pain medication, primarily Tylenol Also encouraged her to try baclofen that was prescribed 2 months ago. Physical therapy as above     Orders Placed This Encounter    Procedures  . Home Health    Order Specific Question:   To provide the following care/treatments    Answer:   PT  . Face-to-face encounter (required for Medicare/Medicaid patients)    I Kenn File certify that this patient is under my care and that I, or a nurse practitioner or physician's assistant working with me, had a face-to-face encounter that meets the physician face-to-face encounter requirements with this patient on 11/10/2015. The encounter with the patient was in whole, or in part for the following medical condition(s) which is the primary reason for home health care (List medical condition): Late effects after stroke,  neck pain due to cervical foraminal stenosis, right lower extremity weakness, neck muscle spasm.    Order Specific Question:   The encounter with the patient was in whole, or in part, for the following medical condition, which is the primary reason for home health care    Answer:   Late effects after stroke,  neck pain due to cervical foraminal stenosis, right lower extremity weakness, neck muscle spasm.    Order Specific Question:   I certify that, based on my findings, the following services are medically necessary home health services    Answer:   Physical therapy    Order Specific Question:   Reason for Medically Necessary Home Health Services    Answer:   Therapy- Personnel officer, Adult nurse Specific Question:   Reason for  Medically Necessary Home Health Services    Answer:   Therapy- Therapeutic Exercises to Increase Strength and Endurance    Order Specific Question:   Reason for Medically Necessary Home Health Services    Answer:   Therapy- Home Adaptation to Facilitate Safety    Order Specific Question:   My clinical findings support the need for the above services    Answer:   Unable to leave home safely without assistance and/or assistive device    Order Specific Question:   Further, I certify that my clinical findings  support that this patient is homebound due to:    Answer:   Unable to leave home safely without assistance    Laroy Apple, MD [facility] 11/10/2015, 4:25 PM

## 2015-11-11 ENCOUNTER — Other Ambulatory Visit: Payer: Self-pay | Admitting: Nurse Practitioner

## 2015-11-11 ENCOUNTER — Other Ambulatory Visit: Payer: Self-pay | Admitting: Family Medicine

## 2015-11-11 DIAGNOSIS — E039 Hypothyroidism, unspecified: Secondary | ICD-10-CM

## 2015-11-14 ENCOUNTER — Telehealth: Payer: Self-pay | Admitting: *Deleted

## 2015-11-14 NOTE — Telephone Encounter (Signed)
Will ask nursing to call in verbal order.   I agree with the theapy.   Laroy Apple, MD [facility] 11/14/2015, 3:22 PM

## 2015-11-16 NOTE — Telephone Encounter (Signed)
Must have a complete face to face again, no exceptions. Called pt.

## 2015-11-16 NOTE — Telephone Encounter (Signed)
Talked to Wheeler at [facility], no face to face needed. He will take care of it all. Pt was called back by me

## 2015-11-21 ENCOUNTER — Telehealth: Payer: Self-pay | Admitting: Family Medicine

## 2015-11-21 ENCOUNTER — Ambulatory Visit: Payer: Self-pay | Admitting: Family Medicine

## 2015-11-21 NOTE — Telephone Encounter (Signed)
Blood sugar of 89 is not hypoglycemia. Although in internal diabetes it could be perceived as hypoglycemia, consider this relative hypoglycemia.  Appreciate fyi  Laroy Apple, MD [facility] 11/21/2015, 5:05 PM

## 2015-11-22 ENCOUNTER — Telehealth: Payer: Self-pay

## 2015-11-22 NOTE — Telephone Encounter (Signed)
Needs to be seen for swelling and dsypnea.   Laroy Apple, MD [facility] 11/22/2015, 3:52 PM

## 2015-11-24 ENCOUNTER — Encounter: Payer: Self-pay | Admitting: Family Medicine

## 2015-11-24 ENCOUNTER — Ambulatory Visit (INDEPENDENT_AMBULATORY_CARE_PROVIDER_SITE_OTHER): Payer: Self-pay | Admitting: Family Medicine

## 2015-11-24 VITALS — BP 131/70 | HR 66 | Temp 97.0°F | Ht 61.52 in | Wt 176.4 lb

## 2015-11-24 DIAGNOSIS — R42 Dizziness and giddiness: Secondary | ICD-10-CM

## 2015-11-24 NOTE — Progress Notes (Signed)
   HPI  Patient presents today here for dizziness with leg swelling.  Dizziness Less than 1 week symptoms, described as unsteadiness symptoms, worse with head turning, however no room spinning sensation No new weakness or numbness or tingling. Patient has recent history of CVA in December, 8-9 months ago. Her daughter says that she has stable right-sided facial droop from that.  She is taking baclofen for neck stiffness which does seem to be helping. He is also taking meclizine 2 times daily. Closing is not helping the dizziness.  She is taking aspirin daily.  She denies shortness of breath, cough, or wheezing. No fevers.  Like swelling is bilateral, mostly feet, not tender and not painful.   PMH: Smoking status noted ROS: Per HPI  Objective: BP 131/70   Pulse 66   Temp 97 F (36.1 C) (Oral)   Ht 5' 1.52" (1.563 m)   Wt 176 lb 6.4 oz (80 kg)   BMI 32.77 kg/m  Gen: NAD, alert, cooperative with exam HEENT: NCAT, PERRLA, EOMI CV: RRR, good S1/S2, no murmur Resp: CTABL, no wheezes, non-labored Ext: No swelling at all appreciated on bilateral lower extremities Neuro: Alert and oriented, 5/5 and sensation intact in bilateral lower extremities, strength 4/5 and symmetric in bilateral upper extremities, 2+ patellar tendon reflexes symmetric Normal gait Right-sided facial droop, daughter states this is stable since last stroke.  Assessment and plan:  # Dizziness Blood pressure is good, no signs of new CVA. For some reason patient has lost insurance coverage, normally I would order MRI today to rule out new CVA.  I've explained this to the family and they would like to defer MRI, they will watch carefully for additional signs or symptoms. MRI is likely not necessary given her lack of new neurologic symptoms. He has recently started baclofen, she's also on twice a day meclizine. Baclofen is helping, meclizine is not, discontinue meclizine. Low threshold for MRI, low threshold  for ED visit. Family is addressing the uninsured status  # Leg swelling Appreciated on exam Reassurance provided, no dyspnea or other signs of CHF. Last EF was 55-60% with grade 1 diastolic dysfunction at the time of her stroke 9 months ago.   Laroy Apple, MD [facility] 11/24/2015, 9:06 AM

## 2015-11-30 ENCOUNTER — Ambulatory Visit (INDEPENDENT_AMBULATORY_CARE_PROVIDER_SITE_OTHER): Payer: Self-pay | Admitting: *Deleted

## 2015-11-30 DIAGNOSIS — R55 Syncope and collapse: Secondary | ICD-10-CM

## 2015-12-01 ENCOUNTER — Telehealth: Payer: Self-pay

## 2015-12-01 NOTE — Telephone Encounter (Signed)
Appreciate FYI, ED follow up recommended.   Laroy Apple, MD [facility] 12/01/2015, 2:36 PM

## 2015-12-01 NOTE — Progress Notes (Signed)
[facility] Summary Report / Loop Recorder 

## 2015-12-01 NOTE — Telephone Encounter (Signed)
appt made

## 2015-12-05 ENCOUNTER — Emergency Department (HOSPITAL_COMMUNITY)
Admission: EM | Admit: 2015-12-05 | Discharge: 2015-12-05 | Disposition: A | Discharge status: Home / Self Care | Payer: Medicare Other | Attending: Emergency Medicine | Admitting: Emergency Medicine

## 2015-12-05 ENCOUNTER — Encounter: Payer: Self-pay | Admitting: Family Medicine

## 2015-12-05 ENCOUNTER — Ambulatory Visit (INDEPENDENT_AMBULATORY_CARE_PROVIDER_SITE_OTHER): Payer: Self-pay | Admitting: Family Medicine

## 2015-12-05 ENCOUNTER — Emergency Department (HOSPITAL_COMMUNITY): Payer: Medicare Other

## 2015-12-05 ENCOUNTER — Other Ambulatory Visit: Payer: Self-pay

## 2015-12-05 ENCOUNTER — Encounter (HOSPITAL_COMMUNITY): Payer: Self-pay | Admitting: Emergency Medicine

## 2015-12-05 VITALS — BP 136/70 | HR 67

## 2015-12-05 DIAGNOSIS — R4182 Altered mental status, unspecified: Secondary | ICD-10-CM

## 2015-12-05 DIAGNOSIS — R531 Weakness: Secondary | ICD-10-CM | POA: Diagnosis not present

## 2015-12-05 DIAGNOSIS — Z85828 Personal history of other malignant neoplasm of skin: Secondary | ICD-10-CM | POA: Diagnosis not present

## 2015-12-05 DIAGNOSIS — Z951 Presence of aortocoronary bypass graft: Secondary | ICD-10-CM | POA: Diagnosis not present

## 2015-12-05 DIAGNOSIS — I6990 Unspecified sequelae of unspecified cerebrovascular disease: Secondary | ICD-10-CM

## 2015-12-05 DIAGNOSIS — I129 Hypertensive chronic kidney disease with stage 1 through stage 4 chronic kidney disease, or unspecified chronic kidney disease: Secondary | ICD-10-CM | POA: Diagnosis not present

## 2015-12-05 DIAGNOSIS — Z79899 Other long term (current) drug therapy: Secondary | ICD-10-CM | POA: Diagnosis not present

## 2015-12-05 DIAGNOSIS — R410 Disorientation, unspecified: Secondary | ICD-10-CM | POA: Diagnosis not present

## 2015-12-05 DIAGNOSIS — Z8673 Personal history of transient ischemic attack (TIA), and cerebral infarction without residual deficits: Secondary | ICD-10-CM | POA: Insufficient documentation

## 2015-12-05 DIAGNOSIS — I2510 Atherosclerotic heart disease of native coronary artery without angina pectoris: Secondary | ICD-10-CM | POA: Diagnosis not present

## 2015-12-05 DIAGNOSIS — N183 Chronic kidney disease, stage 3 (moderate): Secondary | ICD-10-CM | POA: Insufficient documentation

## 2015-12-05 DIAGNOSIS — E039 Hypothyroidism, unspecified: Secondary | ICD-10-CM | POA: Diagnosis not present

## 2015-12-05 DIAGNOSIS — Z794 Long term (current) use of insulin: Secondary | ICD-10-CM

## 2015-12-05 DIAGNOSIS — IMO0002 Reserved for concepts with insufficient information to code with codable children: Secondary | ICD-10-CM

## 2015-12-05 DIAGNOSIS — E1165 Type 2 diabetes mellitus with hyperglycemia: Secondary | ICD-10-CM

## 2015-12-05 DIAGNOSIS — E118 Type 2 diabetes mellitus with unspecified complications: Secondary | ICD-10-CM

## 2015-12-05 DIAGNOSIS — Z7982 Long term (current) use of aspirin: Secondary | ICD-10-CM | POA: Diagnosis not present

## 2015-12-05 DIAGNOSIS — B49 Unspecified mycosis: Secondary | ICD-10-CM

## 2015-12-05 DIAGNOSIS — N390 Urinary tract infection, site not specified: Secondary | ICD-10-CM

## 2015-12-05 DIAGNOSIS — E1122 Type 2 diabetes mellitus with diabetic chronic kidney disease: Secondary | ICD-10-CM | POA: Insufficient documentation

## 2015-12-05 LAB — COMPREHENSIVE METABOLIC PANEL
ALT: 11 U/L — AB (ref 14–54)
AST: 21 U/L (ref 15–41)
Albumin: 3.9 g/dL (ref 3.5–5.0)
Alkaline Phosphatase: 51 U/L (ref 38–126)
Anion gap: 8 (ref 5–15)
BUN: 17 mg/dL (ref 6–20)
CHLORIDE: 106 mmol/L (ref 101–111)
CO2: 25 mmol/L (ref 22–32)
CREATININE: 1.09 mg/dL — AB (ref 0.44–1.00)
Calcium: 9.3 mg/dL (ref 8.9–10.3)
GFR calc Af Amer: 54 mL/min — ABNORMAL LOW (ref >60)
GFR, EST NON AFRICAN AMERICAN: 46 mL/min — AB (ref >60)
Glucose, Bld: 67 mg/dL (ref 65–99)
POTASSIUM: 4.2 mmol/L (ref 3.5–5.1)
SODIUM: 139 mmol/L (ref 135–145)
Total Bilirubin: 0.6 mg/dL (ref 0.3–1.2)
Total Protein: 7.4 g/dL (ref 6.5–8.1)

## 2015-12-05 LAB — CBC WITH DIFFERENTIAL/PLATELET
BASOS ABS: 0 10*3/uL (ref 0.0–0.1)
BASOS PCT: 0 %
EOS ABS: 0.2 10*3/uL (ref 0.0–0.7)
EOS PCT: 2 %
HCT: 42 % (ref 36.0–46.0)
Hemoglobin: 13.4 g/dL (ref 12.0–15.0)
Lymphocytes Relative: 19 %
Lymphs Abs: 2.1 10*3/uL (ref 0.7–4.0)
MCH: 26.6 pg (ref 26.0–34.0)
MCHC: 31.9 g/dL (ref 30.0–36.0)
MCV: 83.5 fL (ref 78.0–100.0)
MONO ABS: 0.6 10*3/uL (ref 0.1–1.0)
Monocytes Relative: 5 %
Neutro Abs: 8.6 10*3/uL — ABNORMAL HIGH (ref 1.7–7.7)
Neutrophils Relative %: 74 %
PLATELETS: 240 10*3/uL (ref 150–400)
RBC: 5.03 MIL/uL (ref 3.87–5.11)
RDW: 15.7 % — AB (ref 11.5–15.5)
WBC: 11.5 10*3/uL — AB (ref 4.0–10.5)

## 2015-12-05 LAB — URINE MICROSCOPIC-ADD ON: RBC / HPF: NONE SEEN RBC/hpf (ref 0–5)

## 2015-12-05 LAB — URINALYSIS, ROUTINE W REFLEX MICROSCOPIC
BILIRUBIN URINE: NEGATIVE
Glucose, UA: NEGATIVE mg/dL
HGB URINE DIPSTICK: NEGATIVE
Ketones, ur: NEGATIVE mg/dL
NITRITE: NEGATIVE
PH: 5 (ref 5.0–8.0)
Protein, ur: NEGATIVE mg/dL
SPECIFIC GRAVITY, URINE: 1.023 (ref 1.005–1.030)

## 2015-12-05 LAB — CBG MONITORING, ED
GLUCOSE-CAPILLARY: 112 mg/dL — AB (ref 65–99)
GLUCOSE-CAPILLARY: 79 mg/dL (ref 65–99)
Glucose-Capillary: 59 mg/dL — ABNORMAL LOW (ref 65–99)

## 2015-12-05 LAB — GLUCOSE HEMOCUE WAIVED: GLU HEMOCUE WAIVED: 51 mg/dL — AB (ref 65–99)

## 2015-12-05 MED ORDER — CEPHALEXIN 250 MG/5ML PO SUSR: 1000.0000 mg | Freq: Two times a day (BID) | ORAL | 0 refills | Status: DC

## 2015-12-05 MED ORDER — CEPHALEXIN 250 MG/5ML PO SUSR
1000.0000 mg | Freq: Once | ORAL | Status: AC
  Administered 2015-12-05: 1000 mg via ORAL
  Filled 2015-12-05: qty 20

## 2015-12-05 MED ORDER — NYSTATIN 100000 UNIT/GM EX POWD: Freq: Four times a day (QID) | CUTANEOUS | 1 refills | Status: DC

## 2015-12-05 MED ORDER — NYSTATIN 100000 UNIT/GM EX POWD
Freq: Once | CUTANEOUS | Status: AC
  Administered 2015-12-05: 23:00:00 via TOPICAL
  Filled 2015-12-05: qty 15

## 2015-12-05 MED ORDER — DEXTROSE 50 % IV SOLN
INTRAVENOUS | Status: AC
  Administered 2015-12-05: 25 mL via INTRAVENOUS
  Filled 2015-12-05: qty 50

## 2015-12-05 MED ORDER — DEXTROSE 50 % IV SOLN
25.0000 mL | Freq: Once | INTRAVENOUS | Status: AC
  Administered 2015-12-05: 25 mL via INTRAVENOUS

## 2015-12-05 NOTE — ED Provider Notes (Signed)
[facility] DEPT Provider Note   CSN: [id number] Arrival date & time: 12/05/15  1454     History   Chief Complaint Chief Complaint  Patient presents with  . Altered Mental Status    HPI Leah Levine is a 82 y.o. female.  Patient is a 82 year old female with a history of a CVA this past December with residual right-sided weakness and speech deficits. She also has a history of coronary artery disease status post bypass surgery, hypertension, hyperlipidemia and diabetes. She presents with altered mental status. The family states that she was fine yesterday morning. However after church she went home and went to sleep. She's had increased sleepiness. She's had decreased activity and generalized weakness. They've noted that her blood sugars have been ranging between 60 and 180. It had a hard time getting her up and walking her. She's had decreased oral intake. She has residual right-sided weakness from her prior stroke but it seems to be unchanged from baseline per the family. They noted her speech is unchanged. Patient denies any chest pain or shortness of breath. No abdominal pain. No other recent illnesses. No cough or congestion. She was seen by her PCP earlier today and apparently at that time she had increased weakness and reportedly an increase in her deficit. They were concerned that she might have had another stroke and sent her here for further evaluation. The family states that she is markedly improved since that time. She did have a recent fall about 2 weeks ago where she struck the back of her head. She has a scabbed area to the back of her head. History is limited due to her expressive aphasia.      Past Medical History:  Diagnosis Date  . Allergy   . Anemia   . CAD (coronary artery disease) 11-2012   CABG x 4 utilizing LIMA to LAD, SVG to Diagonal, SVG to Left Circumflex, and SVG to RCA  . Diabetes mellitus type 2, controlled ([place])   . GERD (gastroesophageal  reflux disease)   . Hyperlipidemia   . Hypertension    patient denies ever having hypertension  . Hypothyroidism   . Personal history of colonic polyps 11/20/2010   tubular adenomas  . Skin cancer    ear, right side of cheek  . Stroke ([place])   . Tremor     Patient Active Problem List   Diagnosis Date Noted  . Late effects of CVA (cerebrovascular accident) 11/10/2015  . Foraminal stenosis of cervical region 11/10/2015  . Frequent falls 11/10/2015  . Hypertriglyceridemia 09/05/2015  . Neck muscle spasm 08/26/2015  . CKD stage 3 due to type 2 diabetes mellitus ([place]) 08/26/2015  . Intertrigo 03/14/2015  . Stroke (cerebrum) ([place]) 03/05/2015  . Expressive aphasia 03/05/2015  . Syncope 12/09/2013  . CAD (coronary artery disease) 09/18/2013  . Hypothyroidism 06/18/2013  . S/P CABG x 4 12/01/2012  . Vitamin B12 deficiency 12/05/2010  . Personal history of colonic polyps 12/05/2010  . Esophageal dysphagia 11/20/2010  . Benign neoplasm of colon 11/20/2010  . Gastritis, chronic 11/20/2010  . Allergic rhinitis 07/03/2010  . Tremor   . GERD (gastroesophageal reflux disease)   . Hyperlipidemia   . Hypertension   . Urinary incontinence   . Diabetes mellitus type 2, uncontrolled ([place])     Past Surgical History:  Procedure Laterality Date  . ABDOMINAL HYSTERECTOMY    . APPENDECTOMY    . BACK SURGERY    . CARPAL TUNNEL RELEASE    .  CHOLECYSTECTOMY    . CORONARY ARTERY BYPASS GRAFT N/A 11/26/2012   Procedure: CORONARY ARTERY BYPASS GRAFTING (CABG);  Surgeon: Grace Isaac, MD;  Location: [facility];  Service: Open Heart Surgery;  Laterality: N/A;  . INTRAOPERATIVE TRANSESOPHAGEAL ECHOCARDIOGRAM N/A 11/26/2012   Procedure: INTRAOPERATIVE TRANSESOPHAGEAL ECHOCARDIOGRAM;  Surgeon: Grace Isaac, MD;  Location: [facility];  Service: Open Heart Surgery;  Laterality: N/A;  . KNEE ARTHROSCOPY     bilateral  . LEFT HEART CATHETERIZATION WITH CORONARY ANGIOGRAM N/A 11/25/2012   Procedure: LEFT HEART  CATHETERIZATION WITH CORONARY ANGIOGRAM;  Surgeon: Larey Dresser, MD;  Location: [facility] CATH LAB;  Service: Cardiovascular;  Laterality: N/A;  . LOOP RECORDER IMPLANT N/A 12/10/2013   Procedure: LOOP RECORDER IMPLANT;  Surgeon: Coralyn Mark, MD;  Location: [facility] CATH LAB;  Service: Cardiovascular;  Laterality: N/A;  . NECK SURGERY      OB History    No data available       Home Medications    Prior to Admission medications   Medication Sig Start Date End Date Taking? Authorizing Provider  aspirin EC 325 MG EC tablet Take 1 tablet (325 mg total) by mouth daily. 12/11/13   Nita Sells, MD  atorvastatin (LIPITOR) 40 MG tablet Take 1 tablet (40 mg total) by mouth daily. 09/05/15   Timmothy Euler, MD  baclofen (LIORESAL) 10 MG tablet 1/2 pill twice daily for 1 week, then 1 pill twice daily for 1 week, increase to 2 pills twice daily in 1 week if no improvement 08/26/15   Timmothy Euler, MD  diclofenac sodium (VOLTAREN) 1 % GEL Apply 4 g topically 4 (four) times daily. 08/26/15   Timmothy Euler, MD  glucose blood (ONE TOUCH ULTRA TEST) test strip Test one time a day and prn  Dx E11.9 11/30/14   Mary-Margaret Hassell Done, FNP  Insulin Pen Needle 32G X 4 MM MISC Use with insulin pen to dispense insulin daily 03/08/15   Orson Eva, MD  insulin starter kit- pen needles MISC 1 kit by Other route once. 03/08/15   Orson Eva, MD  JANUMET 50-1000 MG tablet TAKE 1 TABLET TWICE A DAY WITH A MEAL 08/08/15   Timmothy Euler, MD  Lancets [facility] ULTRASOFT) lancets Test one time a day and prn  Dx e11.9 11/30/14   Mary-Margaret Hassell Done, FNP  levothyroxine (SYNTHROID) 88 MCG tablet Take 1 tablet (88 mcg total) by mouth daily before breakfast. 04/06/15   Sharion Balloon, FNP  levothyroxine (SYNTHROID, LEVOTHROID) 100 MCG tablet TAKE 1 TABLET (100 MCG TOTAL) BY MOUTH DAILY. 11/14/15   Timmothy Euler, MD  meclizine (ANTIVERT) 25 MG tablet TAKE 1 TABLET (25 MG TOTAL) BY MOUTH 2 (TWO) TIMES DAILY. 11/11/15   Timmothy Euler, MD  omeprazole (PRILOSEC) 20 MG capsule TAKE 1 CAPSULE (20 MG TOTAL) BY MOUTH DAILY. 03/30/15   Timmothy Euler, MD  propranolol ER (INDERAL LA) 80 MG 24 hr capsule Take 1 capsule (80 mg total) by mouth daily. 03/07/15   Orson Eva, MD  traZODone (DESYREL) 50 MG tablet 1 to 2 tablets 30 minutes before bed as needed for difficulty sleeping 05/05/15   Timmothy Euler, MD    Family History Family History  Problem Relation Age of Onset  . Ovarian cancer Mother   . Diabetes Father   . Pneumonia Father   . Diabetes Sister   . Stroke Daughter   . Heart attack Sister   . Heart attack Brother  Social History Social History  Substance Use Topics  . Smoking status: Never Smoker  . Smokeless tobacco: Never Used  . Alcohol use No     Allergies   Celebrex [celecoxib]; Fenofibrate; and Statins   Review of Systems Review of Systems  Unable to perform ROS: Mental status change     Physical Exam Updated Vital Signs BP 147/71   Pulse 75   Temp 98.2 F (36.8 C)   Resp (!) 30   SpO2 96%   Physical Exam  Constitutional: She appears well-developed and well-nourished.  HENT:  Head: Normocephalic and atraumatic.  Eyes: Pupils are equal, round, and reactive to light.  Neck: Normal range of motion. Neck supple.  Cardiovascular: Normal rate, regular rhythm and normal heart sounds.   Pulmonary/Chest: Effort normal and breath sounds normal. No respiratory distress. She has no wheezes. She has no rales. She exhibits no tenderness.  Abdominal: Soft. Bowel sounds are normal. There is no tenderness. There is no rebound and no guarding.  Musculoskeletal: Normal range of motion. She exhibits no edema.  Patient has no pain along the cervical thoracic or lumbosacral spine. There is no pain on palpation or range of motion of the extremities other than she has a small area of ecchymosis and tenderness to her lateral left foot.  Lymphadenopathy:    She has no cervical adenopathy.    Neurological: She is alert.  Patient is oriented to person and place only. She has weakness in her right arm and right leg which is at baseline per family. She also has some weakness in her left leg which appears to be possibly new. Sensation is grossly intact to light touch all extremities. Pedal pulses are intact. She does have an expressive aphasia but will answer questions. There is no obvious facial drooping or other cranial nerve abnormality. Finger-nose is intact.  Skin: Skin is warm and dry. No rash noted.  Psychiatric: She has a normal mood and affect.     ED Treatments / Results  Labs (all labs ordered are listed, but only abnormal results are displayed) Labs Reviewed  CBC WITH DIFFERENTIAL/PLATELET - Abnormal; Notable for the following:       Result Value   WBC 11.5 (*)    RDW 15.7 (*)    Neutro Abs 8.6 (*)    All other components within normal limits  COMPREHENSIVE METABOLIC PANEL - Abnormal; Notable for the following:    Creatinine, Ser 1.09 (*)    ALT 11 (*)    GFR calc non Af Amer 46 (*)    GFR calc Af Amer 54 (*)    All other components within normal limits  CBG MONITORING, ED - Abnormal; Notable for the following:    Glucose-Capillary 59 (*)    All other components within normal limits  URINALYSIS, ROUTINE W REFLEX MICROSCOPIC (NOT AT [facility])    EKG  EKG Interpretation  Date/Time:  Monday December 05 2015 14:39:40 EDT Ventricular Rate:  80 PR Interval:  110 QRS Duration: 86 QT Interval:  442 QTC Calculation: 509 R Axis:   -17 Text Interpretation:  Sinus rhythm with short PR with occasional Premature ventricular complexes Abnormal QRS-T angle, consider primary T wave abnormality Prolonged QT Abnormal ECG since last tracing no significant change Confirmed by Kallee Nam  MD, Angelise Petrich ([id number]) on 12/05/2015 3:12:21 PM       Radiology Ct Head Wo Contrast  Result Date: 12/05/2015 CLINICAL DATA:  Altered mental status EXAM: CT HEAD WITHOUT CONTRAST TECHNIQUE:  Contiguous axial  images were obtained from the base of the skull through the vertex without intravenous contrast. COMPARISON:  03/05/2015 FINDINGS: Brain: No intracranial hemorrhage, mass effect or midline shift. Mild cerebral atrophy. Mild periventricular and patchy subcortical white matter decreased attenuation probable due to chronic small vessel ischemic changes. There is old appearing infarct in left basal ganglia. No definite acute cortical infarction. No mass lesion is noted on this unenhanced scan. Vascular: Mild atherosclerotic calcifications of carotid siphon. Skull: Normal. Negative for fracture or focal lesion. Sinuses/Orbits: No acute finding. Other: None. IMPRESSION: No acute intracranial abnormality. Stable atrophy and chronic white matter disease. Old appearing lacunar infarct in left basal ganglia. No definite acute cortical infarction. Electronically Signed   By: Lahoma Crocker M.D.   On: 12/05/2015 15:46    Procedures Procedures (including critical care time)  Medications Ordered in ED Medications  dextrose 50 % solution 25 mL (25 mLs Intravenous Given 12/05/15 1555)     Initial Impression / Assessment and Plan / ED Course  I have reviewed the triage vital signs and the nursing notes.  Pertinent labs & imaging results that were available during my care of the patient were reviewed by me and considered in my medical decision making (see chart for details).  Clinical Course    Pt presents with confusion and AMS.  She is almost back to baseline per family now, Although she seems to have some generalized weakness. I don't get any clear evidence of a new focal deficit although again she did have some increase speech deficits at her physician's office. She has failed the swallow screen here in the ED. Her head CT is negative. She will likely need an MRI. Her glucose is low L need to be monitored. Her family states she did take her Janumet this morning. Her imaging studies are still  pending. Her urinalysis is still pending.  Dr. Dayna Barker to follow  Final Clinical Impressions(s) / ED Diagnoses   Final diagnoses:  Confusion  Weakness    New Prescriptions New Prescriptions   No medications on file     Malvin Johns, MD 12/05/15 1635

## 2015-12-05 NOTE — ED Notes (Signed)
Care handoff to [place],. RN

## 2015-12-05 NOTE — Care Management (Signed)
ED CM met with patient and family at bedside, Patient 's family reports patient lives at home with daughter Phill Mutter [phone] as primary caretaker. Daughter states, patient is active with [facility] receiving HHRN,PT,OT.SLP services. Updated Dr. Dayna Barker EDP, No further ED CM needs identified.

## 2015-12-05 NOTE — ED Notes (Signed)
PT remains in MRI. Visitors have no requests at this time.

## 2015-12-05 NOTE — ED Notes (Signed)
Patient Alert and oriented X4. Stable and ambulatory. Patient verbalized understanding of the discharge instructions.  Patient belongings were taken by the patient.  

## 2015-12-05 NOTE — Patient Instructions (Signed)
Discontinue glipizide from here.   Please come back within 1 week after your trip to the ED today.

## 2015-12-05 NOTE — ED Triage Notes (Signed)
Per EMS- pt was brought into MD office for evaluation of stroke like symptoms starting yesterday at 1200. Reported she was not obeying commands, could not stand or walk, was disoriented and had a right sided droop with expressive aphasia. Pt CBG was noted to be 50. Pt was given two cookies. CBG 70 for EMS. Most of symptoms resolved. Pt still having some expressive aphasia. Pt also has dried blood to back of head.

## 2015-12-05 NOTE — Progress Notes (Signed)
   HPI  Patient presents today here with approximately 24-hour history of decreased responsiveness and altered mental status.  Her granddaughter and will and daughter are present. They state that she was able unable to walk at church yesterday. She used her walker to get into church but could not leave. Since that time she's had a blood sugar measured at 60, 1 measured at 110 and 1 measured at 187.  They were concerned of his hypoglycemia.  Since the event occurred at church she has not been able to stand up.  Today when asked she checks herself head no when I ask her if she can tell me her name or where she has. She shakes her head yes when she's when I ask what they have, however she cannot say it.  The last 24 hours she has only used the words yes or no, and not slurred.    PMH: Smoking status noted ROS: Per HPI  Objective: BP 136/70   Pulse 67  Gen: NAD, alert, cooperative with exam HEENT: NCAT, EOMI, PERRL CV: RRR, good S1/S2, no murmur Resp: CTABL, no wheezes, non-labored Abd: SNTND, BS present, no guarding or organomegaly Ext: No edema, warm Neuro: Alert and oriented, No gross deficits  CBG 51  Assessment and plan:  # Altered mental status With right-sided facial droop and CBG of 51 today She has been given some crackers in clinic. Discontinue glipizide Transfer to ED for possible new stroke, although symptoms may be due to hypoglycemia however only 51 in clinic.   # Late effects of CVA Has had right-sided lower extremity discoordination and weakness previously, this does not appear to be changed, however the patient does not seem to afford any effort today. Given this could be due to low blood sugar, even if it's relatively low for her.  Right-sided facial droop has not been noted before.   Orders Placed This Encounter  Procedures  . Glucose Hemocue Copperhill, MD [facility] 12/05/2015, 1:14 PM

## 2015-12-05 NOTE — ED Notes (Signed)
Patient returned from MRI.

## 2015-12-05 NOTE — ED Provider Notes (Signed)
12:51 AM Assumed care from Dr. Tamera Punt, please see their note for full history, physical and decision making until this point. In brief this is a 82 y.o. year old female who presented to the ED tonight with Altered Mental Status     Hypoglycemia of unclear etiology, awaiting MRI to eval for cva, urine to eval for infxn, repeat cbg's as well.   ua concerning for possible infection, makes sense with symptoms,w ill treat.  Mri neg for acute stroke.  Already has PT/OT/speech at home.  Ambulates with assistance similar to baseline. Stable for discharge.   Discharge instructions, including strict return precautions for new or worsening symptoms, given. Patient and/or family verbalized understanding and agreement with the plan as described.   Labs, studies and imaging reviewed by myself and considered in medical decision making if ordered. Imaging interpreted by radiology.  Labs Reviewed  CBC WITH DIFFERENTIAL/PLATELET - Abnormal; Notable for the following:       Result Value   WBC 11.5 (*)    RDW 15.7 (*)    Neutro Abs 8.6 (*)    All other components within normal limits  COMPREHENSIVE METABOLIC PANEL - Abnormal; Notable for the following:    Creatinine, Ser 1.09 (*)    ALT 11 (*)    GFR calc non Af Amer 46 (*)    GFR calc Af Amer 54 (*)    All other components within normal limits  URINALYSIS, ROUTINE W REFLEX MICROSCOPIC (NOT AT [facility]) - Abnormal; Notable for the following:    APPearance HAZY (*)    Leukocytes, UA MODERATE (*)    All other components within normal limits  URINE MICROSCOPIC-ADD ON - Abnormal; Notable for the following:    Squamous Epithelial / LPF 6-30 (*)    Bacteria, UA FEW (*)    All other components within normal limits  CBG MONITORING, ED - Abnormal; Notable for the following:    Glucose-Capillary 59 (*)    All other components within normal limits  CBG MONITORING, ED - Abnormal; Notable for the following:    Glucose-Capillary 112 (*)    All other components  within normal limits  CBG MONITORING, ED    MR Brain Wo Contrast  Final Result    DG Chest 2 View  Final Result    DG Foot Complete Left  Final Result    CT Head Wo Contrast  Final Result      No Follow-up on file.    Merrily Pew, MD 12/06/15 [phone]

## 2015-12-06 ENCOUNTER — Telehealth: Payer: Self-pay

## 2015-12-06 NOTE — Telephone Encounter (Signed)
Ok to DC glipizide and [place]. With recent hypoglycemia in ED yesterday I think this is the right step even if she could afford it.   Laroy Apple, MD [facility] 12/06/2015, 4:45 PM

## 2015-12-07 ENCOUNTER — Ambulatory Visit (INDEPENDENT_AMBULATORY_CARE_PROVIDER_SITE_OTHER): Payer: Self-pay | Admitting: Family Medicine

## 2015-12-07 ENCOUNTER — Encounter: Payer: Self-pay | Admitting: Family Medicine

## 2015-12-07 VITALS — BP 132/70 | HR 58 | Temp 97.9°F | Ht 61.52 in | Wt 172.2 lb

## 2015-12-07 DIAGNOSIS — N3000 Acute cystitis without hematuria: Secondary | ICD-10-CM

## 2015-12-07 DIAGNOSIS — Z794 Long term (current) use of insulin: Secondary | ICD-10-CM

## 2015-12-07 DIAGNOSIS — IMO0002 Reserved for concepts with insufficient information to code with codable children: Secondary | ICD-10-CM

## 2015-12-07 DIAGNOSIS — E1165 Type 2 diabetes mellitus with hyperglycemia: Secondary | ICD-10-CM

## 2015-12-07 DIAGNOSIS — E118 Type 2 diabetes mellitus with unspecified complications: Secondary | ICD-10-CM

## 2015-12-07 MED ORDER — METFORMIN HCL 1000 MG PO TABS: 1000.0000 mg | ORAL_TABLET | Freq: Two times a day (BID) | ORAL | 3 refills | Status: DC

## 2015-12-07 NOTE — Progress Notes (Signed)
   HPI  Patient presents today here for hospital follow-up.  Patient was seen a few days ago and sent to the emergency room from clinic for concern of a new CVA, hypoglycemia, and altered mental status.  In the hospital she was found to have likely UTI, low blood sugar, and new CVA was ruled out with MRI and CT scan.  She feels much better. She's taking cephalexin as prescribed.  She recently lost insurance coverage and cannot afford Janumet.  PMH: Smoking status noted ROS: Per HPI  Objective: BP 132/70   Pulse (!) 58   Temp 97.9 F (36.6 C) (Oral)   Ht 5' 1.52" (1.563 m)   Wt 172 lb 3.2 oz (78.1 kg)   BMI 31.99 kg/m  Gen: NAD, alert, cooperative with exam HEENT: NCAT CV: RRR Resp: CTABL, no wheezes, non-labored Abd: SNTND, BS present, no guarding or organomegaly Ext: No edema, warm Neuro: Alert and oriented, right-sided facial droop, strength 4/5 and symmetric in bilateral upper and lower extremities, cranial nerves II through XII intact with slight decrease of right-sided shoulder shrug  Scalp with heme crusting over the occiput, no signs of infection.  Assessment and plan:  # UTI Treated in the emergency room, subsequent encounter Improving overall Finish cephalexin as prescribed  # Type 2 diabetes Recent hypoglycemia, possibly due to UTI Reducing risk of hypoglycemia by stopping Janumet and glipizide Starting plain metformin Her GFR is 46, continue to monitor GFR every 3 months.    Meds ordered this encounter  Medications  . metFORMIN (GLUCOPHAGE) 1000 MG tablet    Sig: Take 1 tablet (1,000 mg total) by mouth 2 (two) times daily with a meal.    Dispense:  60 tablet    Refill:  Jones, MD [facility] 12/07/2015, 2:25 PM

## 2015-12-07 NOTE — Telephone Encounter (Signed)
Aware to stop the two medications due to hypoglycemia.

## 2015-12-07 NOTE — Patient Instructions (Addendum)
Great to see you!  For your diavbetes: Stop glipizide and janumet Start plain metformin  Come back in 1 month, keep a blood sugar log

## 2015-12-09 ENCOUNTER — Telehealth: Payer: Self-pay | Admitting: Family Medicine

## 2015-12-12 NOTE — Telephone Encounter (Signed)
Appreciate FYI- family concerns for caretakers not doing a good job.  Laroy Apple, MD [facility] 12/12/2015, 7:40 AM

## 2015-12-24 ENCOUNTER — Ambulatory Visit (INDEPENDENT_AMBULATORY_CARE_PROVIDER_SITE_OTHER): Payer: Medicare Other | Admitting: Family Medicine

## 2015-12-24 DIAGNOSIS — I69341 Monoplegia of lower limb following cerebral infarction affecting right dominant side: Secondary | ICD-10-CM

## 2015-12-24 DIAGNOSIS — E119 Type 2 diabetes mellitus without complications: Secondary | ICD-10-CM | POA: Diagnosis not present

## 2015-12-24 DIAGNOSIS — Z7982 Long term (current) use of aspirin: Secondary | ICD-10-CM | POA: Diagnosis not present

## 2015-12-24 DIAGNOSIS — Z7984 Long term (current) use of oral hypoglycemic drugs: Secondary | ICD-10-CM

## 2015-12-24 DIAGNOSIS — I69320 Aphasia following cerebral infarction: Secondary | ICD-10-CM | POA: Diagnosis not present

## 2015-12-24 DIAGNOSIS — R296 Repeated falls: Secondary | ICD-10-CM

## 2015-12-24 DIAGNOSIS — M4802 Spinal stenosis, cervical region: Secondary | ICD-10-CM | POA: Diagnosis not present

## 2015-12-30 ENCOUNTER — Ambulatory Visit (INDEPENDENT_AMBULATORY_CARE_PROVIDER_SITE_OTHER): Payer: Self-pay | Admitting: *Deleted

## 2015-12-30 DIAGNOSIS — R55 Syncope and collapse: Secondary | ICD-10-CM

## 2015-12-31 LAB — CUP PACEART REMOTE DEVICE CHECK: Date Time Interrogation Session: 20170907030814

## 2015-12-31 NOTE — Progress Notes (Signed)
Carelink summary report received. Battery status OK. Normal device function. No new symptom episodes, tachy episodes, brady, or pause episodes. No new AF episodes. Monthly summary reports and ROV/PRN 

## 2016-01-02 NOTE — Progress Notes (Signed)
[facility] Summary Report / Loop Recorder 

## 2016-01-27 ENCOUNTER — Encounter (HOSPITAL_COMMUNITY): Payer: Self-pay | Admitting: Oncology

## 2016-01-27 ENCOUNTER — Emergency Department (HOSPITAL_COMMUNITY)
Admission: EM | Admit: 2016-01-27 | Discharge: 2016-01-28 | Disposition: A | Discharge status: Home / Self Care | Payer: Medicare Other | Attending: Emergency Medicine | Admitting: Emergency Medicine

## 2016-01-27 DIAGNOSIS — S0083XA Contusion of other part of head, initial encounter: Secondary | ICD-10-CM | POA: Insufficient documentation

## 2016-01-27 DIAGNOSIS — R931 Abnormal findings on diagnostic imaging of heart and coronary circulation: Secondary | ICD-10-CM | POA: Insufficient documentation

## 2016-01-27 DIAGNOSIS — R55 Syncope and collapse: Secondary | ICD-10-CM | POA: Insufficient documentation

## 2016-01-27 DIAGNOSIS — Y92000 Kitchen of unspecified non-institutional (private) residence as  the place of occurrence of the external cause: Secondary | ICD-10-CM | POA: Diagnosis not present

## 2016-01-27 DIAGNOSIS — Y939 Activity, unspecified: Secondary | ICD-10-CM | POA: Insufficient documentation

## 2016-01-27 DIAGNOSIS — Y999 Unspecified external cause status: Secondary | ICD-10-CM | POA: Insufficient documentation

## 2016-01-27 DIAGNOSIS — S0993XA Unspecified injury of face, initial encounter: Secondary | ICD-10-CM | POA: Diagnosis present

## 2016-01-27 DIAGNOSIS — W19XXXA Unspecified fall, initial encounter: Secondary | ICD-10-CM

## 2016-01-27 DIAGNOSIS — W010XXA Fall on same level from slipping, tripping and stumbling without subsequent striking against object, initial encounter: Secondary | ICD-10-CM | POA: Insufficient documentation

## 2016-01-27 DIAGNOSIS — M545 Low back pain: Secondary | ICD-10-CM

## 2016-01-27 DIAGNOSIS — G8929 Other chronic pain: Secondary | ICD-10-CM

## 2016-01-27 MED ORDER — MORPHINE SULFATE (PF) 2 MG/ML IV SOLN
2.0000 mg | Freq: Once | INTRAVENOUS | Status: AC
  Administered 2016-01-28: 2 mg via INTRAVENOUS
  Filled 2016-01-27: qty 1

## 2016-01-27 NOTE — ED Triage Notes (Signed)
Pt reports that she felt dizzy prior to the fall.  Pt cannot recall her birthday.  States she thinks she may have had LOC.

## 2016-01-27 NOTE — ED Notes (Signed)
Bed: WA15 Expected date:  Expected time:  Means of arrival:  Comments: EMS-fall 

## 2016-01-27 NOTE — ED Provider Notes (Signed)
[facility] DEPT Provider Note   CSN: [id number] Arrival date & time: 01/27/16  2306 By signing my name below, I, Doran Stabler, attest that this documentation has been prepared under the direction and in the presence of Charlann Lange, PA-C. Electronically Signed: Doran Stabler, ED Scribe. 01/27/16. 11:29 PM.  History   Chief Complaint Chief Complaint  Patient presents with  . Fall    The history is provided by the patient. No language interpreter was used.   HPI Comments: Leah Levine is a 82 y.o. female who presents to the Emergency Department via EMS for an evaluation s/p unwittnessed fall prior to arrival. Pt reports that she was in standing in the kitchen when she suddenly lost her balance and fell. She states she may have had a syncopal episode. Since then she reports head pain and back pain. Pt denies any leg pain, hip pain, CP, SOB, N/V/D, or other symptoms at this time. NKDA. Pt is not on any blood thinners.  Past Medical History:  Diagnosis Date  . Allergy   . Anemia   . CAD (coronary artery disease) 11-2012   CABG x 4 utilizing LIMA to LAD, SVG to Diagonal, SVG to Left Circumflex, and SVG to RCA  . Diabetes mellitus type 2, controlled ([place])   . GERD (gastroesophageal reflux disease)   . Hyperlipidemia   . Hypertension    patient denies ever having hypertension  . Hypothyroidism   . Personal history of colonic polyps 11/20/2010   tubular adenomas  . Skin cancer    ear, right side of cheek  . Stroke ([place])   . Tremor    Patient Active Problem List   Diagnosis Date Noted  . Late effects of CVA (cerebrovascular accident) 11/10/2015  . Foraminal stenosis of cervical region 11/10/2015  . Frequent falls 11/10/2015  . Hypertriglyceridemia 09/05/2015  . Neck muscle spasm 08/26/2015  . CKD stage 3 due to type 2 diabetes mellitus ([place]) 08/26/2015  . Intertrigo 03/14/2015  . Stroke (cerebrum) ([place]) 03/05/2015  . Expressive aphasia 03/05/2015  . Syncope  12/09/2013  . CAD (coronary artery disease) 09/18/2013  . Hypothyroidism 06/18/2013  . S/P CABG x 4 12/01/2012  . Vitamin B12 deficiency 12/05/2010  . Personal history of colonic polyps 12/05/2010  . Esophageal dysphagia 11/20/2010  . Benign neoplasm of colon 11/20/2010  . Gastritis, chronic 11/20/2010  . Allergic rhinitis 07/03/2010  . Tremor   . GERD (gastroesophageal reflux disease)   . Hyperlipidemia   . Hypertension   . Urinary incontinence   . Diabetes mellitus type 2, uncontrolled ([place])    Past Surgical History:  Procedure Laterality Date  . ABDOMINAL HYSTERECTOMY    . APPENDECTOMY    . BACK SURGERY    . CARPAL TUNNEL RELEASE    . CHOLECYSTECTOMY    . CORONARY ARTERY BYPASS GRAFT N/A 11/26/2012   Procedure: CORONARY ARTERY BYPASS GRAFTING (CABG);  Surgeon: Grace Isaac, MD;  Location: [facility];  Service: Open Heart Surgery;  Laterality: N/A;  . INTRAOPERATIVE TRANSESOPHAGEAL ECHOCARDIOGRAM N/A 11/26/2012   Procedure: INTRAOPERATIVE TRANSESOPHAGEAL ECHOCARDIOGRAM;  Surgeon: Grace Isaac, MD;  Location: [facility];  Service: Open Heart Surgery;  Laterality: N/A;  . KNEE ARTHROSCOPY     bilateral  . LEFT HEART CATHETERIZATION WITH CORONARY ANGIOGRAM N/A 11/25/2012   Procedure: LEFT HEART CATHETERIZATION WITH CORONARY ANGIOGRAM;  Surgeon: Larey Dresser, MD;  Location: [facility] CATH LAB;  Service: Cardiovascular;  Laterality: N/A;  . LOOP RECORDER IMPLANT N/A 12/10/2013   Procedure:  LOOP RECORDER IMPLANT;  Surgeon: Coralyn Mark, MD;  Location: [facility] CATH LAB;  Service: Cardiovascular;  Laterality: N/A;  . NECK SURGERY     OB History    No data available     Home Medications    Prior to Admission medications   Medication Sig Start Date End Date Taking? Authorizing Provider  aspirin EC 325 MG EC tablet Take 1 tablet (325 mg total) by mouth daily. 12/11/13   Nita Sells, MD  cephALEXin (KEFLEX) 250 MG/5ML suspension Take 20 mLs (1,000 mg total) by mouth 2 (two) times daily.  12/05/15   Merrily Pew, MD  diclofenac sodium (VOLTAREN) 1 % GEL Apply 4 g topically 4 (four) times daily. 08/26/15   Timmothy Euler, MD  glucose blood (ONE TOUCH ULTRA TEST) test strip Test one time a day and prn  Dx E11.9 11/30/14   Mary-Margaret Hassell Done, FNP  Lancets [facility] ULTRASOFT) lancets Test one time a day and prn  Dx e11.9 11/30/14   Mary-Margaret Hassell Done, FNP  levothyroxine (SYNTHROID, LEVOTHROID) 100 MCG tablet TAKE 1 TABLET (100 MCG TOTAL) BY MOUTH DAILY. 11/14/15   Timmothy Euler, MD  meclizine (ANTIVERT) 25 MG tablet TAKE 1 TABLET (25 MG TOTAL) BY MOUTH 2 (TWO) TIMES DAILY. Patient taking differently: Take 25 mg by mouth 2 (two) times daily as needed for dizziness.  11/11/15   Timmothy Euler, MD  metFORMIN (GLUCOPHAGE) 1000 MG tablet Take 1 tablet (1,000 mg total) by mouth 2 (two) times daily with a meal. 12/07/15   Timmothy Euler, MD  nystatin (MYCOSTATIN/NYSTOP) powder Apply topically 4 (four) times daily. 12/05/15   Merrily Pew, MD  omeprazole (PRILOSEC) 20 MG capsule TAKE 1 CAPSULE (20 MG TOTAL) BY MOUTH DAILY. 03/30/15   Timmothy Euler, MD  propranolol ER (INDERAL LA) 80 MG 24 hr capsule Take 1 capsule (80 mg total) by mouth daily. 03/07/15   Orson Eva, MD  traZODone (DESYREL) 50 MG tablet 1 to 2 tablets 30 minutes before bed as needed for difficulty sleeping Patient taking differently: Take 50-100 mg by mouth See admin instructions. 30 minutes before bed as needed for difficulty sleeping 05/05/15   Timmothy Euler, MD   Family History Family History  Problem Relation Age of Onset  . Ovarian cancer Mother   . Diabetes Father   . Pneumonia Father   . Diabetes Sister   . Stroke Daughter   . Heart attack Sister   . Heart attack Brother    Social History Social History  Substance Use Topics  . Smoking status: Never Smoker  . Smokeless tobacco: Never Used  . Alcohol use No   Allergies   Celebrex [celecoxib]; Fenofibrate; and Statins  Review of Systems Review of  Systems  Constitutional: Negative for appetite change and fever.  HENT: Negative.   Eyes: Negative for visual disturbance.  Respiratory: Negative for shortness of breath.   Cardiovascular: Negative for chest pain.  Gastrointestinal: Negative for diarrhea, nausea and vomiting.  Genitourinary: Negative.   Musculoskeletal: Positive for back pain.  Neurological: Positive for syncope (questionable syncopal episode). Negative for light-headedness and headaches.   Physical Exam Updated Vital Signs BP 159/77 (BP Location: Left Arm)   Pulse 68   Temp 98 F (36.7 C) (Oral)   Resp 12   Ht 5' (1.524 m)   Wt 185 lb (83.9 kg)   SpO2 94%   BMI 36.13 kg/m   Physical Exam  Constitutional: She is oriented to person, place, and time.  She appears well-developed and well-nourished.  HENT:  Head: Normocephalic.  Right Ear: No hemotympanum.  Left Ear: No hemotympanum.  Large hematoma to the central forehead without any lacerations or bleeding. The rest of the head is otherwise atraumatic.  Eyes: Conjunctivae are normal.  Cardiovascular: Normal rate.   Pulmonary/Chest: Effort normal. She exhibits no tenderness.  Abdominal: Soft. There is no tenderness.  Atraumatic  Musculoskeletal:  Moderate to severe lower thoracic midline spinal tenderness. Significant discomfort with movement of T-spine. No hip pain. Full pain free ROM of both lower extremities.   Neurological: She is alert and oriented to person, place, and time.  Skin: Skin is warm and dry.  Psychiatric: She has a normal mood and affect.  Nursing note and vitals reviewed.  ED Treatments / Results  DIAGNOSTIC STUDIES: Oxygen Saturation is 94% on room air, normal by my interpretation.    COORDINATION OF CARE: 11:29 PM Discussed treatment plan with pt at bedside and pt agreed to plan.  Labs (all labs ordered are listed, but only abnormal results are displayed) Labs Reviewed - No data to display  EKG  EKG Interpretation None       Radiology No results found. Ct Head Wo Contrast  Result Date: 01/28/2016 CLINICAL DATA:  Status post unwitnessed fall. Syncopal episode. Head and neck pain. Initial encounter. EXAM: CT HEAD WITHOUT CONTRAST CT CERVICAL SPINE WITHOUT CONTRAST CT THORACIC SPINE WITHOUT CONTRAST TECHNIQUE: Multidetector CT imaging of the head, cervical spine and thoracic spine was performed following the standard protocol without intravenous contrast. Multiplanar CT image reconstructions of the cervical spine and thoracic spine were also generated. COMPARISON:  CT of the head and MRI of the brain performed 12/05/2015 FINDINGS: CT HEAD FINDINGS Brain: No evidence of acute infarction, hemorrhage, hydrocephalus, extra-axial collection or mass lesion/mass effect. Prominence of the ventricles and sulci reflects mild to moderate cortical volume loss. Mild periventricular and subcortical white matter change likely reflects small vessel ischemic microangiopathy. Chronic lacunar infarcts are noted at the basal ganglia bilaterally. Cerebellar atrophy is noted. The brainstem and fourth ventricle are within normal limits. The cerebral hemispheres demonstrate grossly normal gray-white differentiation. No mass effect or midline shift is seen. Vascular: No hyperdense vessel or unexpected calcification. Skull: There is no evidence of fracture; visualized osseous structures are unremarkable in appearance. Sinuses/Orbits: The visualized portions of the orbits are within normal limits. The paranasal sinuses and mastoid air cells are well-aerated. Other: Soft tissue swelling is noted overlying the frontal calvarium. CT CERVICAL SPINE FINDINGS Alignment: There is mild grade 1 anterolisthesis of C3 on C4, reflecting underlying facet disease. Skull base and vertebrae: No acute fracture. No primary bone lesion or focal pathologic process. There is incomplete fusion of the posterior arch of C1. Soft tissues and spinal canal: No prevertebral fluid or  swelling. No visible canal hematoma. Disc levels: Mild disc space narrowing is noted along the lower cervical and upper thoracic spine, with underlying postoperative change at the posterior spinous processes. Upper chest: The visualized lung apices are grossly clear. The thyroid gland is unremarkable. Other: No significant soft tissue abnormalities are seen. CT THORACIC SPINE FINDINGS Alignment: Normal. Skull base and vertebrae: No acute fracture. No primary bone lesion or focal pathologic process. Soft tissues and spinal canal: The spinal canal is unremarkable in appearance. No visible canal hematoma. Disc levels: Intervertebral disc spaces are preserved. Anterior and lateral bridging osteophytes are noted along the thoracic spine. A small posterior disc protrusion is noted at T6-T7. Chest: Mild bilateral atelectasis is  noted. A 1.1 cm calcified granuloma is noted at the left midlung. No pleural effusion or pneumothorax is seen. Diffuse coronary artery calcifications are seen. Scattered calcification is noted along the descending thoracic aorta and proximal abdominal aorta. The patient is status post cholecystectomy, with clips along the gallbladder fossa. The paraspinal musculature is unremarkable. Other: No additional soft tissue abnormalities are seen. IMPRESSION: 1. No evidence of traumatic intracranial injury or fracture. 2. No evidence of fracture or subluxation along the cervical or thoracic spine. 3. Soft tissue swelling overlying the frontal calvarium. 4. Mild to moderate cortical volume loss and scattered small vessel ischemic microangiopathy. 5. Chronic lacunar infarcts at the basal ganglia bilaterally. 6. Mild degenerative change along the lower cervical and upper thoracic spine, and anterior and lateral bridging osteophytes along the thoracic spine. 7. Mild bilateral atelectasis noted. 8. Diffuse coronary artery calcifications seen. 9. Scattered aortic atherosclerosis noted. Electronically Signed   By:  Garald Balding M.D.   On: 01/28/2016 02:12   Ct Cervical Spine Wo Contrast  Result Date: 01/28/2016 CLINICAL DATA:  Status post unwitnessed fall. Syncopal episode. Head and neck pain. Initial encounter. EXAM: CT HEAD WITHOUT CONTRAST CT CERVICAL SPINE WITHOUT CONTRAST CT THORACIC SPINE WITHOUT CONTRAST TECHNIQUE: Multidetector CT imaging of the head, cervical spine and thoracic spine was performed following the standard protocol without intravenous contrast. Multiplanar CT image reconstructions of the cervical spine and thoracic spine were also generated. COMPARISON:  CT of the head and MRI of the brain performed 12/05/2015 FINDINGS: CT HEAD FINDINGS Brain: No evidence of acute infarction, hemorrhage, hydrocephalus, extra-axial collection or mass lesion/mass effect. Prominence of the ventricles and sulci reflects mild to moderate cortical volume loss. Mild periventricular and subcortical white matter change likely reflects small vessel ischemic microangiopathy. Chronic lacunar infarcts are noted at the basal ganglia bilaterally. Cerebellar atrophy is noted. The brainstem and fourth ventricle are within normal limits. The cerebral hemispheres demonstrate grossly normal gray-white differentiation. No mass effect or midline shift is seen. Vascular: No hyperdense vessel or unexpected calcification. Skull: There is no evidence of fracture; visualized osseous structures are unremarkable in appearance. Sinuses/Orbits: The visualized portions of the orbits are within normal limits. The paranasal sinuses and mastoid air cells are well-aerated. Other: Soft tissue swelling is noted overlying the frontal calvarium. CT CERVICAL SPINE FINDINGS Alignment: There is mild grade 1 anterolisthesis of C3 on C4, reflecting underlying facet disease. Skull base and vertebrae: No acute fracture. No primary bone lesion or focal pathologic process. There is incomplete fusion of the posterior arch of C1. Soft tissues and spinal canal: No  prevertebral fluid or swelling. No visible canal hematoma. Disc levels: Mild disc space narrowing is noted along the lower cervical and upper thoracic spine, with underlying postoperative change at the posterior spinous processes. Upper chest: The visualized lung apices are grossly clear. The thyroid gland is unremarkable. Other: No significant soft tissue abnormalities are seen. CT THORACIC SPINE FINDINGS Alignment: Normal. Skull base and vertebrae: No acute fracture. No primary bone lesion or focal pathologic process. Soft tissues and spinal canal: The spinal canal is unremarkable in appearance. No visible canal hematoma. Disc levels: Intervertebral disc spaces are preserved. Anterior and lateral bridging osteophytes are noted along the thoracic spine. A small posterior disc protrusion is noted at T6-T7. Chest: Mild bilateral atelectasis is noted. A 1.1 cm calcified granuloma is noted at the left midlung. No pleural effusion or pneumothorax is seen. Diffuse coronary artery calcifications are seen. Scattered calcification is noted along the descending thoracic  aorta and proximal abdominal aorta. The patient is status post cholecystectomy, with clips along the gallbladder fossa. The paraspinal musculature is unremarkable. Other: No additional soft tissue abnormalities are seen. IMPRESSION: 1. No evidence of traumatic intracranial injury or fracture. 2. No evidence of fracture or subluxation along the cervical or thoracic spine. 3. Soft tissue swelling overlying the frontal calvarium. 4. Mild to moderate cortical volume loss and scattered small vessel ischemic microangiopathy. 5. Chronic lacunar infarcts at the basal ganglia bilaterally. 6. Mild degenerative change along the lower cervical and upper thoracic spine, and anterior and lateral bridging osteophytes along the thoracic spine. 7. Mild bilateral atelectasis noted. 8. Diffuse coronary artery calcifications seen. 9. Scattered aortic atherosclerosis noted.  Electronically Signed   By: Garald Balding M.D.   On: 01/28/2016 02:12   Ct Thoracic Spine Wo Contrast  Result Date: 01/28/2016 CLINICAL DATA:  Status post unwitnessed fall. Syncopal episode. Head and neck pain. Initial encounter. EXAM: CT HEAD WITHOUT CONTRAST CT CERVICAL SPINE WITHOUT CONTRAST CT THORACIC SPINE WITHOUT CONTRAST TECHNIQUE: Multidetector CT imaging of the head, cervical spine and thoracic spine was performed following the standard protocol without intravenous contrast. Multiplanar CT image reconstructions of the cervical spine and thoracic spine were also generated. COMPARISON:  CT of the head and MRI of the brain performed 12/05/2015 FINDINGS: CT HEAD FINDINGS Brain: No evidence of acute infarction, hemorrhage, hydrocephalus, extra-axial collection or mass lesion/mass effect. Prominence of the ventricles and sulci reflects mild to moderate cortical volume loss. Mild periventricular and subcortical white matter change likely reflects small vessel ischemic microangiopathy. Chronic lacunar infarcts are noted at the basal ganglia bilaterally. Cerebellar atrophy is noted. The brainstem and fourth ventricle are within normal limits. The cerebral hemispheres demonstrate grossly normal gray-white differentiation. No mass effect or midline shift is seen. Vascular: No hyperdense vessel or unexpected calcification. Skull: There is no evidence of fracture; visualized osseous structures are unremarkable in appearance. Sinuses/Orbits: The visualized portions of the orbits are within normal limits. The paranasal sinuses and mastoid air cells are well-aerated. Other: Soft tissue swelling is noted overlying the frontal calvarium. CT CERVICAL SPINE FINDINGS Alignment: There is mild grade 1 anterolisthesis of C3 on C4, reflecting underlying facet disease. Skull base and vertebrae: No acute fracture. No primary bone lesion or focal pathologic process. There is incomplete fusion of the posterior arch of C1. Soft  tissues and spinal canal: No prevertebral fluid or swelling. No visible canal hematoma. Disc levels: Mild disc space narrowing is noted along the lower cervical and upper thoracic spine, with underlying postoperative change at the posterior spinous processes. Upper chest: The visualized lung apices are grossly clear. The thyroid gland is unremarkable. Other: No significant soft tissue abnormalities are seen. CT THORACIC SPINE FINDINGS Alignment: Normal. Skull base and vertebrae: No acute fracture. No primary bone lesion or focal pathologic process. Soft tissues and spinal canal: The spinal canal is unremarkable in appearance. No visible canal hematoma. Disc levels: Intervertebral disc spaces are preserved. Anterior and lateral bridging osteophytes are noted along the thoracic spine. A small posterior disc protrusion is noted at T6-T7. Chest: Mild bilateral atelectasis is noted. A 1.1 cm calcified granuloma is noted at the left midlung. No pleural effusion or pneumothorax is seen. Diffuse coronary artery calcifications are seen. Scattered calcification is noted along the descending thoracic aorta and proximal abdominal aorta. The patient is status post cholecystectomy, with clips along the gallbladder fossa. The paraspinal musculature is unremarkable. Other: No additional soft tissue abnormalities are seen. IMPRESSION: 1. No  evidence of traumatic intracranial injury or fracture. 2. No evidence of fracture or subluxation along the cervical or thoracic spine. 3. Soft tissue swelling overlying the frontal calvarium. 4. Mild to moderate cortical volume loss and scattered small vessel ischemic microangiopathy. 5. Chronic lacunar infarcts at the basal ganglia bilaterally. 6. Mild degenerative change along the lower cervical and upper thoracic spine, and anterior and lateral bridging osteophytes along the thoracic spine. 7. Mild bilateral atelectasis noted. 8. Diffuse coronary artery calcifications seen. 9. Scattered aortic  atherosclerosis noted. Electronically Signed   By: Garald Balding M.D.   On: 01/28/2016 02:12    Procedures Procedures (including critical care time)  Medications Ordered in ED Medications - No data to display   Initial Impression / Assessment and Plan / ED Course  I have reviewed the triage vital signs and the nursing notes.  Pertinent labs & imaging results that were available during my care of the patient were reviewed by me and considered in my medical decision making (see chart for details).  Clinical Course    Patient here for evaluation after fall in her kitchen. She reports slipping while wearing socks on slick floor. Hematoma to forehead. CT imaging negative for IC injury.  She is examined by Dr. Randal Buba and found stable for discharge home. She lives at home with family who will be nearby. Patient feels comfortable with discharge home.   Final Clinical Impressions(s) / ED Diagnoses   Final diagnoses:  None   1. Fall 2. Hematoma forehead 3. Chronic low back pain  New Prescriptions New Prescriptions   No medications on file   I personally performed the services described in this documentation, which was scribed in my presence. The recorded information has been reviewed and is accurate.      Charlann Lange, PA-C 02/09/16 2212    Veatrice Kells, MD 02/09/16 2320

## 2016-01-27 NOTE — ED Triage Notes (Signed)
Per EMS pt went to the kitchen to get a drink and did not have slip resistant socks on resulting in a fall.  EMS reports a hematoma to pt's forehead.  Pt also c/o mid back pain 10/10.  Pt is A&O x 4.  Denies neck pain or LOC.

## 2016-01-28 ENCOUNTER — Emergency Department (HOSPITAL_COMMUNITY): Payer: Medicare Other

## 2016-01-28 ENCOUNTER — Encounter (HOSPITAL_COMMUNITY): Payer: Self-pay | Admitting: Emergency Medicine

## 2016-01-28 LAB — CBC WITH DIFFERENTIAL/PLATELET
Basophils Absolute: 0 K/uL (ref 0.0–0.1)
Basophils Relative: 0 %
Eosinophils Absolute: 0.2 K/uL (ref 0.0–0.7)
Eosinophils Relative: 2 %
HCT: 40.1 % (ref 36.0–46.0)
Hemoglobin: 13.5 g/dL (ref 12.0–15.0)
Lymphocytes Relative: 26 %
Lymphs Abs: 2.6 K/uL (ref 0.7–4.0)
MCH: 26.7 pg (ref 26.0–34.0)
MCHC: 33.7 g/dL (ref 30.0–36.0)
MCV: 79.4 fL (ref 78.0–100.0)
Monocytes Absolute: 0.7 K/uL (ref 0.1–1.0)
Monocytes Relative: 7 %
Neutro Abs: 6.5 K/uL (ref 1.7–7.7)
Neutrophils Relative %: 65 %
Platelets: 235 K/uL (ref 150–400)
RBC: 5.05 MIL/uL (ref 3.87–5.11)
RDW: 14.9 % (ref 11.5–15.5)
WBC: 10.1 K/uL (ref 4.0–10.5)

## 2016-01-28 LAB — URINE MICROSCOPIC-ADD ON

## 2016-01-28 LAB — COMPREHENSIVE METABOLIC PANEL WITH GFR
ALT: 16 U/L (ref 14–54)
AST: 29 U/L (ref 15–41)
Albumin: 4 g/dL (ref 3.5–5.0)
Alkaline Phosphatase: 69 U/L (ref 38–126)
Anion gap: 9 (ref 5–15)
BUN: 15 mg/dL (ref 6–20)
CO2: 24 mmol/L (ref 22–32)
Calcium: 9.1 mg/dL (ref 8.9–10.3)
Chloride: 103 mmol/L (ref 101–111)
Creatinine, Ser: 1.2 mg/dL — ABNORMAL HIGH (ref 0.44–1.00)
GFR calc Af Amer: 48 mL/min — ABNORMAL LOW
GFR calc non Af Amer: 41 mL/min — ABNORMAL LOW
Glucose, Bld: 187 mg/dL — ABNORMAL HIGH (ref 65–99)
Potassium: 3.9 mmol/L (ref 3.5–5.1)
Sodium: 136 mmol/L (ref 135–145)
Total Bilirubin: 0.8 mg/dL (ref 0.3–1.2)
Total Protein: 7.8 g/dL (ref 6.5–8.1)

## 2016-01-28 LAB — URINALYSIS, ROUTINE W REFLEX MICROSCOPIC
Bilirubin Urine: NEGATIVE
GLUCOSE, UA: NEGATIVE mg/dL
HGB URINE DIPSTICK: NEGATIVE
Ketones, ur: NEGATIVE mg/dL
Nitrite: NEGATIVE
Protein, ur: NEGATIVE mg/dL
SPECIFIC GRAVITY, URINE: 1.015 (ref 1.005–1.030)
pH: 6 (ref 5.0–8.0)

## 2016-01-28 NOTE — ED Provider Notes (Signed)
Medical screening examination/treatment/procedure(s) were conducted as a shared visit with non-physician practitioner(s) and myself.  I personally evaluated the patient during the encounter.   EKG Interpretation  Date/Time:  Saturday January 28 2016 01:27:21 EDT Ventricular Rate:  67 PR Interval:    QRS Duration: 96 QT Interval:  419 QTC Calculation: 443 R Axis:   -4 Text Interpretation:  Normal sinus rhythm Confirmed by [facility]  MD, Beaux Wedemeyer ([id number]) on 01/28/2016 1:36:37 AM      Seen and personally examined.      Abrasion and cephalohematoma to forehead just right of center, NCAT PERRL, EOMI MMM Neck is supple RRR CTAB NABS FROM x4, non tender.  Gait is normal      Stable for discharge.All questions answered to patient's satisfaction. Based on history and exam patient has been appropriately medically screened and emergency conditions excluded. Patient is stable for discharge at this time. Follow up with your PMD for recheck in 2 days and strict return precautions given.     Veatrice Kells, MD 01/28/16 [phone]

## 2016-01-30 ENCOUNTER — Ambulatory Visit (INDEPENDENT_AMBULATORY_CARE_PROVIDER_SITE_OTHER): Payer: Medicare Other | Admitting: *Deleted

## 2016-01-30 DIAGNOSIS — R55 Syncope and collapse: Secondary | ICD-10-CM | POA: Diagnosis not present

## 2016-01-31 NOTE — Progress Notes (Signed)
[facility] Summary Report / Loop Recorder 

## 2016-02-03 NOTE — ED Provider Notes (Deleted)
Medical screening examination/treatment/procedure(s) were performed by non-physician practitioner and as supervising physician I was immediately available for consultation/collaboration.   EKG Interpretation  Date/Time:  Saturday January 28 2016 01:27:21 EDT Ventricular Rate:  67 PR Interval:    QRS Duration: 96 QT Interval:  419 QTC Calculation: 443 R Axis:   -4 Text Interpretation:  Normal sinus rhythm Confirmed by [facility]  MD, Lucan Riner ([id number]) on 01/28/2016 1:36:37 AM        Leah Antunes, MD 02/03/16 2322

## 2016-02-05 LAB — CUP PACEART REMOTE DEVICE CHECK
Date Time Interrogation Session: 20171007033859
MDC IDC PG IMPLANT DT: 20150917

## 2016-02-05 NOTE — Progress Notes (Signed)
Carelink summary report received. Battery status OK. Normal device function. No new symptom episodes, tachy episodes, brady, or pause episodes. No new AF episodes. Monthly summary reports and ROV/PRN 

## 2016-02-10 ENCOUNTER — Other Ambulatory Visit: Payer: Self-pay | Admitting: Family Medicine

## 2016-02-13 ENCOUNTER — Telehealth: Payer: Self-pay | Admitting: Family Medicine

## 2016-02-13 NOTE — Telephone Encounter (Signed)
Left message to call back jkp 11/20

## 2016-02-21 ENCOUNTER — Ambulatory Visit (INDEPENDENT_AMBULATORY_CARE_PROVIDER_SITE_OTHER): Payer: Medicare Other | Admitting: Family Medicine

## 2016-02-21 ENCOUNTER — Encounter: Payer: Self-pay | Admitting: Family Medicine

## 2016-02-21 VITALS — BP 134/85 | HR 96 | Temp 97.4°F | Ht 61.52 in | Wt 175.0 lb

## 2016-02-21 DIAGNOSIS — I951 Orthostatic hypotension: Secondary | ICD-10-CM | POA: Diagnosis not present

## 2016-02-21 DIAGNOSIS — I6930 Unspecified sequelae of cerebral infarction: Secondary | ICD-10-CM | POA: Diagnosis not present

## 2016-02-21 DIAGNOSIS — R29898 Other symptoms and signs involving the musculoskeletal system: Secondary | ICD-10-CM

## 2016-02-21 DIAGNOSIS — R296 Repeated falls: Secondary | ICD-10-CM | POA: Diagnosis not present

## 2016-02-21 MED ORDER — TRAZODONE HCL 100 MG PO TABS: 100.0000 mg | ORAL_TABLET | Freq: Every day | ORAL | 3 refills | Status: DC

## 2016-02-21 NOTE — Patient Instructions (Addendum)
Great to see you!  Try 100 to 200 mg of trazodone for sleep.   Come back in 1 month to discuss diabetes  Stop meclizine

## 2016-02-21 NOTE — Progress Notes (Signed)
   HPI  Patient presents today for emergency room follow-up from a fall and head injury.  Patient states that she was at her home from bending over to pick up something from the stove when her foot slipped from under her and she fell landing on her for head. She had a large for head hematoma, she was worked up in the emergency room with CT of the head, cervical spine, thoracic spine with no acute findings.  She states that since leaving the hospital she has not felt quite well. She's not been sleeping well at night but has been sleeping with several naps during the day. They have only tried 50 mg of trazodone for sleep.  She requests meclizine for dizziness.  She describes dizziness as an unsteadiness feeling when she first stands up lasting about 10 seconds. She takes propranolol for essential tremor, she states it does not help and she's been taking it for many years.    PMH: Smoking status noted ROS: Per HPI  Objective: BP (!) 156/78 (BP Location: Left Arm, Patient Position: Supine)   Pulse 78   Temp 97.4 F (36.3 C) (Oral)   Ht 5' 1.52" (1.563 m)   Wt 175 lb (79.4 kg)   BMI 32.51 kg/m  Gen: NAD, alert, cooperative with exam HEENT: NCAT CV: RRR, good S1/S2, no murmur Resp: CTABL, no wheezes, non-labored Ext: No edema, warm Neuro: Alert and oriented, strength 5/5 and sensation intact in bilateral lower extremities, reports there seizure in the left lower extremity, strength 3/5 symmetric in bilateral grip and normal sensation Right-sided facial droop with flattening of the right-sided smile, cranial nerves II through XII otherwise intact.      Assessment and plan:  # Orthostatic hypotension Stopping propranolol, this was used for essential tremor. There is no tremor observed on exam today, she states the medication was not very helpful. Consider primidone if needed  # late effects of CVA, bilateral leg weakness Bilateral grip strength weakness, subjective weakness in  the legs, paresthesias in left lower extremity, right-sided drooping of the smile, these are stable Home health physical therapy for strength and home evaluation  Frequent falls HH PT ordered  Meds ordered this encounter  Medications  . traZODone (DESYREL) 100 MG tablet    Sig: Take 1-2 tablets (100-200 mg total) by mouth at bedtime.    Dispense:  60 tablet    Refill:  [place], MD [facility] 02/21/2016, 3:15 PM

## 2016-02-23 ENCOUNTER — Telehealth: Payer: Self-pay | Admitting: Cardiology

## 2016-02-23 NOTE — Telephone Encounter (Signed)
LMOVM requesting that pt send manual transmission b/c home monitor has not updated in at least 14 days.    

## 2016-02-28 ENCOUNTER — Ambulatory Visit (INDEPENDENT_AMBULATORY_CARE_PROVIDER_SITE_OTHER): Payer: Medicare Other | Admitting: Family Medicine

## 2016-02-28 ENCOUNTER — Encounter: Payer: Self-pay | Admitting: Family Medicine

## 2016-02-28 ENCOUNTER — Ambulatory Visit (INDEPENDENT_AMBULATORY_CARE_PROVIDER_SITE_OTHER): Payer: Medicare Other | Admitting: *Deleted

## 2016-02-28 VITALS — BP 126/73 | HR 62 | Temp 97.6°F | Resp 18 | Ht 61.52 in | Wt 175.0 lb

## 2016-02-28 DIAGNOSIS — I6930 Unspecified sequelae of cerebral infarction: Secondary | ICD-10-CM | POA: Diagnosis not present

## 2016-02-28 DIAGNOSIS — H1132 Conjunctival hemorrhage, left eye: Secondary | ICD-10-CM | POA: Diagnosis not present

## 2016-02-28 DIAGNOSIS — H9190 Unspecified hearing loss, unspecified ear: Secondary | ICD-10-CM | POA: Diagnosis not present

## 2016-02-28 DIAGNOSIS — R55 Syncope and collapse: Secondary | ICD-10-CM

## 2016-02-28 MED ORDER — CYCLOSPORINE 0.05 % OP EMUL: 1.0000 [drp] | Freq: Two times a day (BID) | OPHTHALMIC | 5 refills | Status: DC

## 2016-02-28 NOTE — Addendum Note (Signed)
Addended by: Claretta Fraise on: 02/28/2016 03:03 PM   Modules accepted: Orders

## 2016-02-28 NOTE — Progress Notes (Addendum)
Subjective:  Patient ID: Leah Levine, female    DOB: 04-24-1932  Age: 82 y.o. MRN: [id number]  CC: Redness left eye   HPI Leah Levine presents for Redness in the left eye for one day. It burns a bit seems to the affected her vision. There was a second person in Leah Levine room. She did not identify herself. She is presumptively a daughter. She says that the patient is being seen tomorrow morning for evaluation for hearing. And they need a referral done prior to that time. She was not happy to hear that referrals take time and she insisted that we fax it before the end of today.please note that it's almost 3:00 in the afternoon by this time.   History Leah Levine has a past medical history of Allergy; Anemia; CAD (coronary artery disease) (11-2012); Diabetes mellitus type 2, controlled ([place]); GERD (gastroesophageal reflux disease); Hyperlipidemia; Hypertension; Hypothyroidism; Personal history of colonic polyps (11/20/2010); Skin cancer; Stroke ([place]); and Tremor.   She has a past surgical history that includes Appendectomy; Cholecystectomy; Abdominal hysterectomy; Knee arthroscopy; Carpal tunnel release; Back surgery; Coronary artery bypass graft (N/A, 11/26/2012); Intraoprative transesophageal echocardiogram (N/A, 11/26/2012); left heart catheterization with coronary angiogram (N/A, 11/25/2012); loop recorder implant (N/A, 12/10/2013); and Neck surgery.   Her family history includes Diabetes in her father and sister; Heart attack in her brother and sister; Ovarian cancer in her mother; Pneumonia in her father; Stroke in her daughter.She reports that she has never smoked. She has never used smokeless tobacco. She reports that she does not drink alcohol or use drugs.    ROS Review of Systems  Constitutional: Negative for activity change, appetite change and fever.  HENT: Negative for congestion, rhinorrhea and sore throat.   Eyes: Positive for pain and redness. Negative for  photophobia, discharge, itching and visual disturbance.  Respiratory: Negative for cough and shortness of breath.   Cardiovascular: Negative for chest pain and palpitations.  Gastrointestinal: Negative for abdominal pain, diarrhea and nausea.  Genitourinary: Negative for dysuria.  Musculoskeletal: Negative for arthralgias and myalgias.    Objective:  BP 126/73   Pulse 62   Temp 97.6 F (36.4 C) (Oral)   Resp 18   Ht 5' 1.52" (1.563 m)   Wt 175 lb (79.4 kg)   SpO2 97%   BMI 32.51 kg/m   BP Readings from Last 3 Encounters:  02/28/16 126/73  02/21/16 134/85  01/28/16 142/77    Wt Readings from Last 3 Encounters:  02/28/16 175 lb (79.4 kg)  02/21/16 175 lb (79.4 kg)  01/27/16 185 lb (83.9 kg)     Physical Exam  Eyes: Right eye exhibits no chemosis and no hordeolum. No foreign body present in the right eye. Left eye exhibits no chemosis and no hordeolum. No foreign body present in the left eye. Right conjunctiva is not injected. Left conjunctiva has a hemorrhage.     Lab Results  Component Value Date   WBC 10.1 01/27/2016   HGB 13.5 01/27/2016   HCT 40.1 01/27/2016   PLT 235 01/27/2016   GLUCOSE 187 (H) 01/27/2016   CHOL 244 (H) 08/26/2015   TRIG 449 (H) 08/26/2015   HDL 37 (L) 08/26/2015   LDLCALC Comment 08/26/2015   ALT 16 01/27/2016   AST 29 01/27/2016   NA 136 01/27/2016   K 3.9 01/27/2016   CL 103 01/27/2016   CREATININE 1.20 (H) 01/27/2016   BUN 15 01/27/2016   CO2 24 01/27/2016   TSH 0.383 (L) 04/05/2015  INR 1.07 03/05/2015   HGBA1C 11.0 (H) 03/06/2015   MICROALBUR neg 11/11/2014      Assessment & Plan:   Latece was seen today for redness left eye.  Diagnoses and all orders for this visit:  Subconjunctival hematoma, left  Other orders -     cycloSPORINE (RESTASIS) 0.05 % ophthalmic emulsion; Place 1 drop into both eyes 2 (two) times daily.     I have discontinued Leah Levine's cephALEXin. I am also having her start on cycloSPORINE.  Additionally, I am having her maintain her aspirin, glucose blood, onetouch ultrasoft, propranolol ER, omeprazole, diclofenac sodium, meclizine, levothyroxine, nystatin, metFORMIN, traMADol, tiotropium, glipiZIDE, propranolol, and traZODone.  Meds ordered this encounter  Medications  . cycloSPORINE (RESTASIS) 0.05 % ophthalmic emulsion    Sig: Place 1 drop into both eyes 2 (two) times daily.    Dispense:  0.4 mL    Refill:  5     Follow-up: No Follow-up on file.  Claretta Fraise, M.D.

## 2016-02-29 NOTE — Progress Notes (Signed)
[facility] Summary Report / Loop Recorder 

## 2016-03-05 ENCOUNTER — Encounter (HOSPITAL_COMMUNITY): Payer: Self-pay | Admitting: Emergency Medicine

## 2016-03-05 ENCOUNTER — Emergency Department (HOSPITAL_COMMUNITY): Payer: Medicare Other

## 2016-03-05 ENCOUNTER — Observation Stay (HOSPITAL_COMMUNITY)
Admission: EM | Admit: 2016-03-05 | Discharge: 2016-03-07 | Disposition: A | Discharge status: Home / Self Care | Payer: Medicare Other | Attending: Emergency Medicine | Admitting: Emergency Medicine

## 2016-03-05 DIAGNOSIS — R079 Chest pain, unspecified: Secondary | ICD-10-CM | POA: Diagnosis present

## 2016-03-05 DIAGNOSIS — I129 Hypertensive chronic kidney disease with stage 1 through stage 4 chronic kidney disease, or unspecified chronic kidney disease: Secondary | ICD-10-CM | POA: Insufficient documentation

## 2016-03-05 DIAGNOSIS — Z79899 Other long term (current) drug therapy: Secondary | ICD-10-CM | POA: Diagnosis not present

## 2016-03-05 DIAGNOSIS — E1122 Type 2 diabetes mellitus with diabetic chronic kidney disease: Secondary | ICD-10-CM | POA: Diagnosis not present

## 2016-03-05 DIAGNOSIS — Z85828 Personal history of other malignant neoplasm of skin: Secondary | ICD-10-CM | POA: Insufficient documentation

## 2016-03-05 DIAGNOSIS — Z951 Presence of aortocoronary bypass graft: Secondary | ICD-10-CM | POA: Diagnosis not present

## 2016-03-05 DIAGNOSIS — Z7984 Long term (current) use of oral hypoglycemic drugs: Secondary | ICD-10-CM | POA: Diagnosis not present

## 2016-03-05 DIAGNOSIS — I2510 Atherosclerotic heart disease of native coronary artery without angina pectoris: Secondary | ICD-10-CM | POA: Diagnosis not present

## 2016-03-05 DIAGNOSIS — R52 Pain, unspecified: Secondary | ICD-10-CM

## 2016-03-05 DIAGNOSIS — E039 Hypothyroidism, unspecified: Secondary | ICD-10-CM | POA: Diagnosis not present

## 2016-03-05 DIAGNOSIS — Z7982 Long term (current) use of aspirin: Secondary | ICD-10-CM | POA: Diagnosis not present

## 2016-03-05 DIAGNOSIS — N183 Chronic kidney disease, stage 3 (moderate): Secondary | ICD-10-CM | POA: Diagnosis not present

## 2016-03-05 DIAGNOSIS — Z8673 Personal history of transient ischemic attack (TIA), and cerebral infarction without residual deficits: Secondary | ICD-10-CM | POA: Diagnosis not present

## 2016-03-05 DIAGNOSIS — M79602 Pain in left arm: Secondary | ICD-10-CM | POA: Diagnosis present

## 2016-03-05 LAB — CBC
HCT: 38.4 % (ref 36.0–46.0)
Hemoglobin: 12.5 g/dL (ref 12.0–15.0)
MCH: 26.1 pg (ref 26.0–34.0)
MCHC: 32.6 g/dL (ref 30.0–36.0)
MCV: 80.2 fL (ref 78.0–100.0)
PLATELETS: 201 10*3/uL (ref 150–400)
RBC: 4.79 MIL/uL (ref 3.87–5.11)
RDW: 15.1 % (ref 11.5–15.5)
WBC: 9.3 10*3/uL (ref 4.0–10.5)

## 2016-03-05 LAB — BASIC METABOLIC PANEL
Anion gap: 9 (ref 5–15)
BUN: 12 mg/dL (ref 6–20)
CALCIUM: 9 mg/dL (ref 8.9–10.3)
CO2: 22 mmol/L (ref 22–32)
CREATININE: 1.09 mg/dL — AB (ref 0.44–1.00)
Chloride: 104 mmol/L (ref 101–111)
GFR calc non Af Amer: 46 mL/min — ABNORMAL LOW (ref >60)
GFR, EST AFRICAN AMERICAN: 54 mL/min — AB (ref >60)
Glucose, Bld: 215 mg/dL — ABNORMAL HIGH (ref 65–99)
Potassium: 4.3 mmol/L (ref 3.5–5.1)
SODIUM: 135 mmol/L (ref 135–145)

## 2016-03-05 LAB — I-STAT TROPONIN, ED: TROPONIN I, POC: 0 ng/mL (ref 0.00–0.08)

## 2016-03-05 NOTE — ED Provider Notes (Signed)
[facility] DEPT Provider Note   CSN: [id number] Arrival date & time: 03/05/16  2214     History   Chief Complaint Chief Complaint  Patient presents with  . Chest Pain    HPI Leah Levine is a 82 y.o. female.  HPI   Patient is a 82 year old female with history of CAD s/p CABG x4 stents, Loop recorder implant, CVA, GERD, hypertension, hyperlipidemia, diabetes who presents the ED via EMS from home with complaint of chest pain. Patient's daughter reports around 9:30 when the patient was laying in bed resting she started complaining of left-sided chest pain that radiated into her left arm. Daughter reports patient was complaining of pain for approximately 2 hours before her chest pain resolved. Patient currently denies any pain or complaints at this time. Daughter reports patient is mildly confused at baseline status post CVA, but notes she is currently at her baseline mental status. Daughter also reports patient has seemed slightly more tired and weak over the past few days. Patient endorses dysuria that started earlier today. Denies fever, chills, headache, lightheadedness, dizziness, cough, shortness of breath, wheezing, palpitations, abdominal pain, nausea, vomiting, diarrhea, numbness, tingling, leg swelling. Daughter reports patient has been taking her medications as prescribed.  PCP- Wendi Snipes Cardiologist- Dr. Percival Spanish  Past Medical History:  Diagnosis Date  . Allergy   . Anemia   . CAD (coronary artery disease) 11-2012   CABG x 4 utilizing LIMA to LAD, SVG to Diagonal, SVG to Left Circumflex, and SVG to RCA  . Diabetes mellitus type 2, controlled ([place])   . GERD (gastroesophageal reflux disease)   . Hyperlipidemia   . Hypertension    patient denies ever having hypertension  . Hypothyroidism   . Personal history of colonic polyps 11/20/2010   tubular adenomas  . Skin cancer    ear, right side of cheek  . Stroke ([place])   . Tremor     Patient Active Problem  List   Diagnosis Date Noted  . Chest pain 03/06/2016  . Orthostatic hypotension 02/21/2016  . Late effect of cerebrovascular accident 11/10/2015  . Foraminal stenosis of cervical region 11/10/2015  . Frequent falls 11/10/2015  . Hypertriglyceridemia 09/05/2015  . Neck muscle spasm 08/26/2015  . CKD stage 3 due to type 2 diabetes mellitus ([place]) 08/26/2015  . Intertrigo 03/14/2015  . Stroke (cerebrum) ([place]) 03/05/2015  . Expressive aphasia 03/05/2015  . Syncope 12/09/2013  . CAD (coronary artery disease) 09/18/2013  . Hypothyroidism 06/18/2013  . S/P CABG x 4 12/01/2012  . Vitamin B12 deficiency 12/05/2010  . Personal history of colonic polyps 12/05/2010  . Esophageal dysphagia 11/20/2010  . Benign neoplasm of colon 11/20/2010  . Gastritis, chronic 11/20/2010  . Allergic rhinitis 07/03/2010  . Tremor   . GERD (gastroesophageal reflux disease)   . Hyperlipidemia   . Hypertension   . Urinary incontinence   . Type 2 diabetes mellitus with renal manifestations [facility])     Past Surgical History:  Procedure Laterality Date  . ABDOMINAL HYSTERECTOMY    . APPENDECTOMY    . BACK SURGERY    . CARPAL TUNNEL RELEASE    . CHOLECYSTECTOMY    . CORONARY ARTERY BYPASS GRAFT N/A 11/26/2012   Procedure: CORONARY ARTERY BYPASS GRAFTING (CABG);  Surgeon: Grace Isaac, MD;  Location: [facility];  Service: Open Heart Surgery;  Laterality: N/A;  . INTRAOPERATIVE TRANSESOPHAGEAL ECHOCARDIOGRAM N/A 11/26/2012   Procedure: INTRAOPERATIVE TRANSESOPHAGEAL ECHOCARDIOGRAM;  Surgeon: Grace Isaac, MD;  Location: [facility];  Service:  Open Heart Surgery;  Laterality: N/A;  . KNEE ARTHROSCOPY     bilateral  . LEFT HEART CATHETERIZATION WITH CORONARY ANGIOGRAM N/A 11/25/2012   Procedure: LEFT HEART CATHETERIZATION WITH CORONARY ANGIOGRAM;  Surgeon: Larey Dresser, MD;  Location: [facility] C[facility] LAB;  Service: Cardiovascular;  Laterality: N/A;  . LOOP RECORDER IMPLANT N/A 12/10/2013   Procedure: LOOP RECORDER IMPLANT;   Surgeon: Coralyn Mark, MD;  Location: [facility] CATH LAB;  Service: Cardiovascular;  Laterality: N/A;  . NECK SURGERY      OB History    No data available       Home Medications    Prior to Admission medications   Medication Sig Start Date End Date Taking? Authorizing Provider  aspirin EC 325 MG EC tablet Take 1 tablet (325 mg total) by mouth daily. 12/11/13   Nita Sells, MD  cycloSPORINE (RESTASIS) 0.05 % ophthalmic emulsion Place 1 drop into both eyes 2 (two) times daily. 02/28/16   Claretta Fraise, MD  diclofenac sodium (VOLTAREN) 1 % GEL Apply 4 g topically 4 (four) times daily. 08/26/15   Timmothy Euler, MD  glipiZIDE (GLUCOTROL) 5 MG tablet Take 5 mg by mouth 2 (two) times daily before a meal.    Historical Provider, MD  glucose blood (ONE TOUCH ULTRA TEST) test strip Test one time a day and prn  Dx E11.9 11/30/14   Mary-Margaret Hassell Done, FNP  Lancets [facility] ULTRASOFT) lancets Test one time a day and prn  Dx e11.9 11/30/14   Mary-Margaret Hassell Done, FNP  levothyroxine (SYNTHROID, LEVOTHROID) 100 MCG tablet TAKE 1 TABLET (100 MCG TOTAL) BY MOUTH DAILY. 11/14/15   Timmothy Euler, MD  meclizine (ANTIVERT) 25 MG tablet TAKE 1 TABLET (25 MG TOTAL) BY MOUTH 2 (TWO) TIMES DAILY. Patient taking differently: Take 25 mg by mouth 2 (two) times daily as needed for dizziness.  11/11/15   Timmothy Euler, MD  metFORMIN (GLUCOPHAGE) 1000 MG tablet Take 1 tablet (1,000 mg total) by mouth 2 (two) times daily with a meal. 12/07/15   Timmothy Euler, MD  nystatin (MYCOSTATIN/NYSTOP) powder Apply topically 4 (four) times daily. 12/05/15   Merrily Pew, MD  omeprazole (PRILOSEC) 20 MG capsule TAKE 1 CAPSULE (20 MG TOTAL) BY MOUTH DAILY. 03/30/15   Timmothy Euler, MD  propranolol (INDERAL) 60 MG tablet TAKE  (1)  TABLET TWICE A DAY. 02/10/16   Timmothy Euler, MD  propranolol ER (INDERAL LA) 80 MG 24 hr capsule Take 1 capsule (80 mg total) by mouth daily. 03/07/15   Orson Eva, MD  tiotropium (SPIRIVA)  18 MCG inhalation capsule Place 18 mcg into inhaler and inhale daily.    Historical Provider, MD  traMADol (ULTRAM) 50 MG tablet Take 50 mg by mouth every 6 (six) hours as needed for moderate pain or severe pain.    Historical Provider, MD  traZODone (DESYREL) 100 MG tablet Take 1-2 tablets (100-200 mg total) by mouth at bedtime. 02/21/16   Timmothy Euler, MD    Family History Family History  Problem Relation Age of Onset  . Ovarian cancer Mother   . Diabetes Father   . Pneumonia Father   . Diabetes Sister   . Stroke Daughter   . Heart attack Sister   . Heart attack Brother     Social History Social History  Substance Use Topics  . Smoking status: Never Smoker  . Smokeless tobacco: Never Used  . Alcohol use No     Allergies   Celebrex [  celecoxib]; Fenofibrate; and Statins   Review of Systems Review of Systems  HENT: Positive for rhinorrhea.   Cardiovascular: Positive for chest pain.  Genitourinary: Positive for dysuria.  Neurological: Positive for weakness.  All other systems reviewed and are negative.    Physical Exam Updated Vital Signs BP (!) 101/54 (BP Location: Right Arm)   Pulse 64   Temp 97.6 F (36.4 C) (Oral)   Resp 14   Ht 5' (1.524 m)   Wt 78.8 kg   SpO2 96%   BMI 33.92 kg/m   Physical Exam  Constitutional: She is oriented to person, place, and time. She appears well-developed and well-nourished. No distress.  HENT:  Head: Normocephalic and atraumatic.  Right Ear: Tympanic membrane normal.  Left Ear: Tympanic membrane normal.  Nose: Nose normal.  Mouth/Throat: Uvula is midline, oropharynx is clear and moist and mucous membranes are normal. No oropharyngeal exudate, posterior oropharyngeal edema, posterior oropharyngeal erythema or tonsillar abscesses. No tonsillar exudate.  Eyes: Conjunctivae and EOM are normal. Pupils are equal, round, and reactive to light. Right eye exhibits no discharge. Left eye exhibits no discharge. No scleral icterus.   Neck: Normal range of motion. Neck supple.  Cardiovascular: Normal rate, regular rhythm, normal heart sounds and intact distal pulses.   Pulmonary/Chest: Effort normal and breath sounds normal. No respiratory distress. She has no wheezes. She has no rales. She exhibits no tenderness.  Abdominal: Soft. Bowel sounds are normal. She exhibits no distension and no mass. There is no tenderness. There is no rebound and no guarding. No hernia.  Musculoskeletal: Normal range of motion. She exhibits no edema or tenderness.  No midline C, T, or L tenderness. Full range of motion of neck and back. Full range of motion of bilateral upper and lower extremities, with 5/5 strength. Sensation intact. 2+ radial and PT pulses. Cap refill <2 seconds.   Neurological: She is alert and oriented to person, place, and time. She has normal strength. No cranial nerve deficit or sensory deficit. She displays a negative Romberg sign. Coordination normal.  Skin: Skin is warm and dry. She is not diaphoretic.  Nursing note and vitals reviewed.    ED Treatments / Results  Labs (all labs ordered are listed, but only abnormal results are displayed) Labs Reviewed  BASIC METABOLIC PANEL - Abnormal; Notable for the following:       Result Value   Glucose, Bld 215 (*)    Creatinine, Ser 1.09 (*)    GFR calc non Af Amer 46 (*)    GFR calc Af Amer 54 (*)    All other components within normal limits  URINALYSIS, ROUTINE W REFLEX MICROSCOPIC - Abnormal; Notable for the following:    APPearance HAZY (*)    Specific Gravity, Urine >1.030 (*)    Nitrite POSITIVE (*)    Leukocytes, UA SMALL (*)    All other components within normal limits  URINALYSIS, MICROSCOPIC (REFLEX) - Abnormal; Notable for the following:    Bacteria, UA MANY (*)    Squamous Epithelial / LPF 0-5 (*)    All other components within normal limits  CBC - Abnormal; Notable for the following:    MCH 25.8 (*)    RDW 15.6 (*)    All other components within  normal limits  CREATININE, SERUM - Abnormal; Notable for the following:    Creatinine, Ser 1.11 (*)    GFR calc non Af Amer 45 (*)    GFR calc Af Amer 53 (*)  All other components within normal limits  CBC  TROPONIN I  TROPONIN I  TROPONIN I  I-STAT TROPOININ, ED    EKG  EKG Interpretation  Date/Time:  Monday March 05 2016 22:24:26 EST Ventricular Rate:  72 PR Interval:    QRS Duration: 110 QT Interval:  426 QTC Calculation: 467 R Axis:   -29 Text Interpretation:  Sinus rhythm Inferior infarct, old Interpretation limited secondary to artifact low voltated No significant change since last tracing Confirmed by Christy Gentles  MD, DONALD ([id number]) on 03/05/2016 11:14:25 PM       Radiology Dg Chest 2 View  Result Date: 03/05/2016 CLINICAL DATA:  82 year old female with chest pain. EXAM: CHEST  2 VIEW COMPARISON:  Chest radiograph dated 12/05/2015 and 03/05/2015 FINDINGS: The lungs are clear. There is no pleural effusion or pneumothorax. Stable left mid lung field granuloma. There is stable mild cardiomegaly. Median sternotomy wires, CABG vascular clips and a loop recorder is noted. The bones are osteopenic. There is degenerative changes of the spine. No acute fracture. IMPRESSION: No acute cardiopulmonary process. Stable mild cardiomegaly. Electronically Signed   By: Anner Crete M.D.   On: 03/05/2016 23:14    Procedures Procedures (including critical care time)  Medications Ordered in ED Medications  cycloSPORINE (RESTASIS) 0.05 % ophthalmic emulsion 1 drop (not administered)  traZODone (DESYREL) tablet 100-200 mg (100 mg Oral Not Given 03/06/16 0315)  propranolol (INDERAL) tablet 60 mg (not administered)  levothyroxine (SYNTHROID, LEVOTHROID) tablet 100 mcg (not administered)  pantoprazole (PROTONIX) EC tablet 40 mg (not administered)  acetaminophen (TYLENOL) tablet 650 mg (not administered)  ondansetron (ZOFRAN) injection 4 mg (not administered)  insulin aspart  (novoLOG) injection 0-9 Units (not administered)  0.9 %  sodium chloride infusion ( Intravenous New Bag/Given 03/06/16 0421)  enoxaparin (LOVENOX) injection 40 mg (not administered)  aspirin EC tablet 325 mg (not administered)     Initial Impression / Assessment and Plan / ED Course  I have reviewed the triage vital signs and the nursing notes.  Pertinent labs & imaging results that were available during my care of the patient were reviewed by me and considered in my medical decision making (see chart for details).  Clinical Course     Patient presents with chest pain that radiated from the left side of her chest to her left arm. Chest pain occurred at 9:30pm while she was resting in bed. History of CAD s/p CABG x4 stents, Loop recorder implant, CVA, GERD, hypertension, hyperlipidemia, diabetes. Daughter at bedside also reports patient has appeared slightly more weak over the past couple days. Patient endorses dysuria that started today. VSS. Exam unremarkable. EKG showed sinus rhythm with no acute changes from prior. Troponin negative. Chest x-ray unremarkable. UA pending. Discussed pt with Dr. Christy Gentles who evaluated the pt in the ED. Due to pt with significant cardiac hx and comorbidities, plan to admit pt for obs chest pain rule out. Consulted hospitalist. Dr. Hal Hope agrees to admission. Orders placed for obs telemetry. Discussed results and plan for admission with patient and family.  Final Clinical Impressions(s) / ED Diagnoses   Final diagnoses:  Chest pain, unspecified type    New Prescriptions Current Discharge Medication List       Nona Dell, [place] 03/06/16 [place], MD 03/06/16 [phone]

## 2016-03-05 NOTE — ED Provider Notes (Signed)
Patient seen/examined in the Emergency Department in conjunction with Midlevel Provider  Patient reports episode of CP/chest pressure that radiated to left UE.  She is now pain free Exam : awake/alert, no arm drift, no murmurs noted Plan: plan to admit.  Pt with h/o CAD.    Ripley Fraise, MD 03/05/16 [phone]

## 2016-03-05 NOTE — ED Notes (Signed)
Pt does not have a pacemaker nor defibrillator. Pt has a loop recorder.

## 2016-03-05 NOTE — ED Triage Notes (Signed)
Per [facility]. Pt has a hx of stroke, pacemaker/defib, and a CABG. Pt called 911 for CP that radiated to the Left arm doesn't hurt if she doesn't move it.

## 2016-03-06 ENCOUNTER — Observation Stay (HOSPITAL_COMMUNITY): Payer: Medicare Other

## 2016-03-06 ENCOUNTER — Other Ambulatory Visit (HOSPITAL_COMMUNITY): Payer: Medicare Other

## 2016-03-06 ENCOUNTER — Observation Stay (HOSPITAL_BASED_OUTPATIENT_CLINIC_OR_DEPARTMENT_OTHER): Payer: Medicare Other

## 2016-03-06 ENCOUNTER — Encounter (HOSPITAL_COMMUNITY): Payer: Self-pay | Admitting: Internal Medicine

## 2016-03-06 DIAGNOSIS — E039 Hypothyroidism, unspecified: Secondary | ICD-10-CM | POA: Diagnosis not present

## 2016-03-06 DIAGNOSIS — R079 Chest pain, unspecified: Secondary | ICD-10-CM | POA: Diagnosis present

## 2016-03-06 DIAGNOSIS — I25708 Atherosclerosis of coronary artery bypass graft(s), unspecified, with other forms of angina pectoris: Secondary | ICD-10-CM | POA: Diagnosis not present

## 2016-03-06 DIAGNOSIS — M79602 Pain in left arm: Secondary | ICD-10-CM | POA: Diagnosis present

## 2016-03-06 LAB — CREATININE, SERUM
Creatinine, Ser: 1.11 mg/dL — ABNORMAL HIGH (ref 0.44–1.00)
GFR, EST AFRICAN AMERICAN: 53 mL/min — AB (ref >60)
GFR, EST NON AFRICAN AMERICAN: 45 mL/min — AB (ref >60)

## 2016-03-06 LAB — URINALYSIS, ROUTINE W REFLEX MICROSCOPIC
BILIRUBIN URINE: NEGATIVE
Glucose, UA: NEGATIVE mg/dL
Hgb urine dipstick: NEGATIVE
Ketones, ur: NEGATIVE mg/dL
NITRITE: POSITIVE — AB
PROTEIN: NEGATIVE mg/dL
pH: 5 (ref 5.0–8.0)

## 2016-03-06 LAB — GLUCOSE, CAPILLARY
GLUCOSE-CAPILLARY: 192 mg/dL — AB (ref 65–99)
Glucose-Capillary: 176 mg/dL — ABNORMAL HIGH (ref 65–99)
Glucose-Capillary: 108 mg/dL — ABNORMAL HIGH (ref 65–99)
Glucose-Capillary: 208 mg/dL — ABNORMAL HIGH (ref 65–99)

## 2016-03-06 LAB — CBC
HEMATOCRIT: 39.9 % (ref 36.0–46.0)
HEMOGLOBIN: 12.6 g/dL (ref 12.0–15.0)
MCH: 25.8 pg — ABNORMAL LOW (ref 26.0–34.0)
MCHC: 31.6 g/dL (ref 30.0–36.0)
MCV: 81.8 fL (ref 78.0–100.0)
Platelets: 203 10*3/uL (ref 150–400)
RBC: 4.88 MIL/uL (ref 3.87–5.11)
RDW: 15.6 % — AB (ref 11.5–15.5)
WBC: 8 10*3/uL (ref 4.0–10.5)

## 2016-03-06 LAB — TROPONIN I
Troponin I: <0.03 ng/mL (ref <0.03)
Troponin I: 0.05 ng/mL (ref <0.03)

## 2016-03-06 LAB — ECHOCARDIOGRAM COMPLETE
HEIGHTINCHES: 60 in
Weight: 2779.2 oz

## 2016-03-06 LAB — URINALYSIS, MICROSCOPIC (REFLEX): RBC / HPF: NONE SEEN RBC/hpf (ref 0–5)

## 2016-03-06 LAB — TSH: TSH: 5.368 u[IU]/mL — ABNORMAL HIGH (ref 0.350–4.500)

## 2016-03-06 MED ORDER — ONDANSETRON HCL 4 MG/2ML IJ SOLN
4.0000 mg | Freq: Four times a day (QID) | INTRAMUSCULAR | Status: DC | PRN
Start: 2016-03-06 — End: 2016-03-07

## 2016-03-06 MED ORDER — PROPRANOLOL HCL 60 MG PO TABS
60.0000 mg | ORAL_TABLET | Freq: Two times a day (BID) | ORAL | Status: DC
  Administered 2016-03-06 – 2016-03-07 (×3): 60 mg via ORAL
  Filled 2016-03-06 (×3): qty 1

## 2016-03-06 MED ORDER — SODIUM CHLORIDE 0.9 % IV SOLN
INTRAVENOUS | Status: DC
  Administered 2016-03-06: 04:00:00 via INTRAVENOUS

## 2016-03-06 MED ORDER — TRAZODONE HCL 100 MG PO TABS
100.0000 mg | ORAL_TABLET | Freq: Every day | ORAL | Status: DC
  Administered 2016-03-06: 100 mg via ORAL
  Filled 2016-03-06: qty 2
  Filled 2016-03-06: qty 1

## 2016-03-06 MED ORDER — PANTOPRAZOLE SODIUM 40 MG PO TBEC
40.0000 mg | DELAYED_RELEASE_TABLET | Freq: Every day | ORAL | Status: DC
  Administered 2016-03-06 – 2016-03-07 (×2): 40 mg via ORAL
  Filled 2016-03-06 (×2): qty 1

## 2016-03-06 MED ORDER — CYCLOSPORINE 0.05 % OP EMUL
1.0000 [drp] | Freq: Two times a day (BID) | OPHTHALMIC | Status: DC
Start: 2016-03-06 — End: 2016-03-07
  Administered 2016-03-06 – 2016-03-07 (×3): 1 [drp] via OPHTHALMIC
  Filled 2016-03-06 (×3): qty 1

## 2016-03-06 MED ORDER — INSULIN ASPART 100 UNIT/ML ~~LOC~~ SOLN
0.0000 [IU] | Freq: Three times a day (TID) | SUBCUTANEOUS | Status: DC
  Administered 2016-03-06: 2 [IU] via SUBCUTANEOUS
  Administered 2016-03-07: 1 [IU] via SUBCUTANEOUS

## 2016-03-06 MED ORDER — ASPIRIN EC 325 MG PO TBEC
325.0000 mg | DELAYED_RELEASE_TABLET | Freq: Every day | ORAL | Status: DC
  Administered 2016-03-06 – 2016-03-07 (×2): 325 mg via ORAL
  Filled 2016-03-06 (×2): qty 1

## 2016-03-06 MED ORDER — ACETAMINOPHEN 325 MG PO TABS: 650.0000 mg | ORAL_TABLET | ORAL | Status: DC | PRN

## 2016-03-06 MED ORDER — LEVOTHYROXINE SODIUM 100 MCG PO TABS
100.0000 ug | ORAL_TABLET | Freq: Every day | ORAL | Status: DC
  Administered 2016-03-06 – 2016-03-07 (×2): 100 ug via ORAL
  Filled 2016-03-06 (×2): qty 1

## 2016-03-06 MED ORDER — ENOXAPARIN SODIUM 40 MG/0.4ML ~~LOC~~ SOLN
40.0000 mg | Freq: Every day | SUBCUTANEOUS | Status: DC
  Administered 2016-03-06 – 2016-03-07 (×2): 40 mg via SUBCUTANEOUS
  Filled 2016-03-06 (×2): qty 0.4

## 2016-03-06 NOTE — H&P (Signed)
History and Physical    Leah Levine [id number] DOB: 12/17/1932 DOA: 03/05/2016  PCP: Kenn File, MD  Patient coming from: Home.  Chief Complaint: Chest pain.  HPI: Leah Levine is a 82 y.o. female with history of CAD status post CABG, stroke, diabetes mellitus, hypothyroidism and hypertension presents to the ER with complaints of chest pain. Patient has been experiencing chest pain over the last 3-4 days. Patient is presently in no distress. Pain is left anterior chest wall with no radiation no associated shortness of breath. Chest pain is not sharp shooting last for a few minutes each time. In the ER EKG and chest x-ray and cardiac markers were negative patient is chest pain-free at this time. Patient is being admitted for further workup for chest pain.   ED Course: EKG cardiac markers chest x-ray does not show anything acute.  Review of Systems: As per HPI, rest all negative.   Past Medical History:  Diagnosis Date  . Allergy   . Anemia   . CAD (coronary artery disease) 11-2012   CABG x 4 utilizing LIMA to LAD, SVG to Diagonal, SVG to Left Circumflex, and SVG to RCA  . Diabetes mellitus type 2, controlled ([place])   . GERD (gastroesophageal reflux disease)   . Hyperlipidemia   . Hypertension    patient denies ever having hypertension  . Hypothyroidism   . Personal history of colonic polyps 11/20/2010   tubular adenomas  . Skin cancer    ear, right side of cheek  . Stroke ([place])   . Tremor     Past Surgical History:  Procedure Laterality Date  . ABDOMINAL HYSTERECTOMY    . APPENDECTOMY    . BACK SURGERY    . CARPAL TUNNEL RELEASE    . CHOLECYSTECTOMY    . CORONARY ARTERY BYPASS GRAFT N/A 11/26/2012   Procedure: CORONARY ARTERY BYPASS GRAFTING (CABG);  Surgeon: Grace Isaac, MD;  Location: [facility];  Service: Open Heart Surgery;  Laterality: N/A;  . INTRAOPERATIVE TRANSESOPHAGEAL ECHOCARDIOGRAM N/A 11/26/2012   Procedure: INTRAOPERATIVE  TRANSESOPHAGEAL ECHOCARDIOGRAM;  Surgeon: Grace Isaac, MD;  Location: [facility];  Service: Open Heart Surgery;  Laterality: N/A;  . KNEE ARTHROSCOPY     bilateral  . LEFT HEART CATHETERIZATION WITH CORONARY ANGIOGRAM N/A 11/25/2012   Procedure: LEFT HEART CATHETERIZATION WITH CORONARY ANGIOGRAM;  Surgeon: Larey Dresser, MD;  Location: [facility] CATH LAB;  Service: Cardiovascular;  Laterality: N/A;  . LOOP RECORDER IMPLANT N/A 12/10/2013   Procedure: LOOP RECORDER IMPLANT;  Surgeon: Coralyn Mark, MD;  Location: [facility] CATH LAB;  Service: Cardiovascular;  Laterality: N/A;  . NECK SURGERY       reports that she has never smoked. She has never used smokeless tobacco. She reports that she does not drink alcohol or use drugs.  Allergies  Allergen Reactions  . Celebrex [Celecoxib] Other (See Comments)    Muscle aches and leg pains  . Fenofibrate Other (See Comments)    Muscle aches and leg pains  . Statins Other (See Comments)    Myalgias; muscle aches and leg pains    Family History  Problem Relation Age of Onset  . Ovarian cancer Mother   . Diabetes Father   . Pneumonia Father   . Diabetes Sister   . Stroke Daughter   . Heart attack Sister   . Heart attack Brother     Prior to Admission medications   Medication Sig Start Date End Date Taking? Authorizing Provider  aspirin EC 325 MG EC tablet Take 1 tablet (325 mg total) by mouth daily. 12/11/13   Nita Sells, MD  cycloSPORINE (RESTASIS) 0.05 % ophthalmic emulsion Place 1 drop into both eyes 2 (two) times daily. 02/28/16   Claretta Fraise, MD  diclofenac sodium (VOLTAREN) 1 % GEL Apply 4 g topically 4 (four) times daily. 08/26/15   Timmothy Euler, MD  glipiZIDE (GLUCOTROL) 5 MG tablet Take 5 mg by mouth 2 (two) times daily before a meal.    Historical Provider, MD  glucose blood (ONE TOUCH ULTRA TEST) test strip Test one time a day and prn  Dx E11.9 11/30/14   Mary-Margaret Hassell Done, FNP  Lancets [facility] ULTRASOFT) lancets Test one time  a day and prn  Dx e11.9 11/30/14   Mary-Margaret Hassell Done, FNP  levothyroxine (SYNTHROID, LEVOTHROID) 100 MCG tablet TAKE 1 TABLET (100 MCG TOTAL) BY MOUTH DAILY. 11/14/15   Timmothy Euler, MD  meclizine (ANTIVERT) 25 MG tablet TAKE 1 TABLET (25 MG TOTAL) BY MOUTH 2 (TWO) TIMES DAILY. Patient taking differently: Take 25 mg by mouth 2 (two) times daily as needed for dizziness.  11/11/15   Timmothy Euler, MD  metFORMIN (GLUCOPHAGE) 1000 MG tablet Take 1 tablet (1,000 mg total) by mouth 2 (two) times daily with a meal. 12/07/15   Timmothy Euler, MD  nystatin (MYCOSTATIN/NYSTOP) powder Apply topically 4 (four) times daily. 12/05/15   Merrily Pew, MD  omeprazole (PRILOSEC) 20 MG capsule TAKE 1 CAPSULE (20 MG TOTAL) BY MOUTH DAILY. 03/30/15   Timmothy Euler, MD  propranolol (INDERAL) 60 MG tablet TAKE  (1)  TABLET TWICE A DAY. 02/10/16   Timmothy Euler, MD  propranolol ER (INDERAL LA) 80 MG 24 hr capsule Take 1 capsule (80 mg total) by mouth daily. 03/07/15   Orson Eva, MD  tiotropium (SPIRIVA) 18 MCG inhalation capsule Place 18 mcg into inhaler and inhale daily.    Historical Provider, MD  traMADol (ULTRAM) 50 MG tablet Take 50 mg by mouth every 6 (six) hours as needed for moderate pain or severe pain.    Historical Provider, MD  traZODone (DESYREL) 100 MG tablet Take 1-2 tablets (100-200 mg total) by mouth at bedtime. 02/21/16   Timmothy Euler, MD    Physical Exam: Vitals:   03/06/16 0100 03/06/16 0115 03/06/16 0130 03/06/16 0152  BP: (!) 102/52 (!) 106/49 (!) 106/51 116/79  Pulse: (!) 59 61 62 (!) 58  Resp: 13 13 13 16   Temp:    97.6 F (36.4 C)  TempSrc:    Oral  SpO2: 94% 95% 94% 95%  Weight:    78.8 kg (173 lb 11.2 oz)  Height:    5' (1.524 m)      Constitutional: Moderately built and nourished. Vitals:   03/06/16 0100 03/06/16 0115 03/06/16 0130 03/06/16 0152  BP: (!) 102/52 (!) 106/49 (!) 106/51 116/79  Pulse: (!) 59 61 62 (!) 58  Resp: 13 13 13 16   Temp:    97.6 F  (36.4 C)  TempSrc:    Oral  SpO2: 94% 95% 94% 95%  Weight:    78.8 kg (173 lb 11.2 oz)  Height:    5' (1.524 m)   Eyes: Anicteric no pallor. ENMT: No discharge from the ears eyes nose and mouth. Neck: No mass felt. No neck rigidity. No JVD appreciated. Respiratory: No rhonchi or crepitations. Cardiovascular: S1-S2 heard no murmurs appreciated. Abdomen: Soft nontender bowel sounds present. Musculoskeletal: No edema. No joint effusion.  Skin: No rash. Skin appears warm. Neurologic: Alert awake oriented to time place and person. Moves all extremities. Psychiatric: Appears normal. Normal affect.   Labs on Admission: I have personally reviewed following labs and imaging studies  CBC:  Recent Labs Lab 03/05/16 2225  WBC 9.3  HGB 12.5  HCT 38.4  MCV 80.2  PLT 123456   Basic Metabolic Panel:  Recent Labs Lab 03/05/16 2225  NA 135  K 4.3  CL 104  CO2 22  GLUCOSE 215*  BUN 12  CREATININE 1.09*  CALCIUM 9.0   GFR: Estimated Creatinine Clearance: 37.6 mL/min (by C-G formula based on SCr of 1.09 mg/dL (H)). Liver Function Tests: No results for input(s): AST, ALT, ALKPHOS, BILITOT, PROT, ALBUMIN in the last 168 hours. No results for input(s): LIPASE, AMYLASE in the last 168 hours. No results for input(s): AMMONIA in the last 168 hours. Coagulation Profile: No results for input(s): INR, PROTIME in the last 168 hours. Cardiac Enzymes: No results for input(s): CKTOTAL, CKMB, CKMBINDEX, TROPONINI in the last 168 hours. BNP (last 3 results) No results for input(s): PROBNP in the last 8760 hours. HbA1C: No results for input(s): HGBA1C in the last 72 hours. CBG: No results for input(s): GLUCAP in the last 168 hours. Lipid Profile: No results for input(s): CHOL, HDL, LDLCALC, TRIG, CHOLHDL, LDLDIRECT in the last 72 hours. Thyroid Function Tests: No results for input(s): TSH, T4TOTAL, FREET4, T3FREE, THYROIDAB in the last 72 hours. Anemia Panel: No results for input(s):  VITAMINB12, FOLATE, FERRITIN, TIBC, IRON, RETICCTPCT in the last 72 hours. Urine analysis:    Component Value Date/Time   COLORURINE YELLOW 03/05/2016 2327   APPEARANCEUR HAZY (A) 03/05/2016 2327   LABSPEC >1.030 (H) 03/05/2016 2327   PHURINE 5.0 03/05/2016 2327   GLUCOSEU NEGATIVE 03/05/2016 2327   HGBUR NEGATIVE 03/05/2016 2327   BILIRUBINUR NEGATIVE 03/05/2016 2327   BILIRUBINUR neg 04/05/2015 1147   KETONESUR NEGATIVE 03/05/2016 2327   PROTEINUR NEGATIVE 03/05/2016 2327   UROBILINOGEN negative 04/05/2015 1147   UROBILINOGEN 0.2 12/09/2013 1820   NITRITE POSITIVE (A) 03/05/2016 2327   LEUKOCYTESUR SMALL (A) 03/05/2016 2327   Sepsis Labs: @LABRCNTIP (procalcitonin:4,lacticidven:4) )No results found for this or any previous visit (from the past 240 hour(s)).   Radiological Exams on Admission: Dg Chest 2 View  Result Date: 03/05/2016 CLINICAL DATA:  82 year old female with chest pain. EXAM: CHEST  2 VIEW COMPARISON:  Chest radiograph dated 12/05/2015 and 03/05/2015 FINDINGS: The lungs are clear. There is no pleural effusion or pneumothorax. Stable left mid lung field granuloma. There is stable mild cardiomegaly. Median sternotomy wires, CABG vascular clips and a loop recorder is noted. The bones are osteopenic. There is degenerative changes of the spine. No acute fracture. IMPRESSION: No acute cardiopulmonary process. Stable mild cardiomegaly. Electronically Signed   By: Anner Crete M.D.   On: 03/05/2016 23:14    EKG: Independently reviewed. Normal sinus rhythm low voltage.  Assessment/Plan Principal Problem:   Chest pain Active Problems:   S/P CABG x 4   Hypothyroidism   CKD stage 3 due to type 2 diabetes mellitus ([place])    1. Chest pain - given history of CAD will cycle cardiac markers check 2-D echo. Patient is on aspirin. Nitroglycerin and beta blockers.Intolerant to statins. 2. Diabetes mellitus type 2 - hold metformin while inpatient. As per the charts patient's  last hemoglobin A1c was 7.5. Patient has been placed on sliding scale coverage. 3. Hypertension on Inderal. 4. Hypothyroidism on Synthroid. 5. History of stroke  on aspirin. 6. Chronic kidney disease stage II - oral metabolic panel.   DVT prophylaxis: Lovenox. Code Status: Full code.  Family Communication: Discussed with patient's daughter.  Disposition Plan: Home.  Consults called: Cardiology consult requested through email.  Admission status: Observation.    Rise Patience MD [facility] Pager [phone].  If 7PM-7AM, please contact night-coverage [email or web address] Password [facility]  03/06/2016, 2:49 AM

## 2016-03-06 NOTE — Progress Notes (Signed)
Patient admitted after midnight, please see H&P.  Echo done.  Pt c/o left shoulder pain after fall, will get x ray.  OT eval ordered as well  Eulogio Bear DO

## 2016-03-06 NOTE — Progress Notes (Signed)
Patient refused bed alarm. Will continue to monitor.  

## 2016-03-06 NOTE — Progress Notes (Addendum)
CARDIOLOGY CONSULT NOTE  Patient ID: Leah Levine, MRN: [id number], DOB/AGE: [birth year] 82 y.o. Admit date: 03/05/2016   Date of Consult: 03/06/2016 Primary Physician: Kenn File, MD Primary Cardiologist:    Hochrein  Chief Complaint:  Chest and arm discomfort  HPI:  The patient is 82 years of age. She has a history of bypass surgery in September, 2014. She saw Dr.Hochrein last in the office in August, 2017. Her last echo was in 2015. Ejection fraction was 60% at that time. She has had a CVA in the past. There is also history of syncope in the past. She has a loop recorder in place that was interrogated last November, 2017. There have been no significant arrhythmias.  This morning I took the history from the patient and several family members. It seems that she has been feeling poorly in general for several days. Along with this she has had fatigue and decreased appetite. She describes some chest discomfort. The discomfort is more marked in her left arm. There is discomfort with motion of the left arm. Historically she had marked nausea at the time that she had her ischemic event leading to bypass surgery in 2014. Currently she has not been having any significant nausea.  Past Medical History:  Diagnosis Date  . Allergy   . Anemia   . CAD (coronary artery disease) 11-2012   CABG x 4 utilizing LIMA to LAD, SVG to Diagonal, SVG to Left Circumflex, and SVG to RCA  . Diabetes mellitus type 2, controlled ([place])   . GERD (gastroesophageal reflux disease)   . Hyperlipidemia   . Hypertension    patient denies ever having hypertension  . Hypothyroidism   . Personal history of colonic polyps 11/20/2010   tubular adenomas  . Skin cancer    ear, right side of cheek  . Stroke ([place])   . Tremor      Social history:    The patient does not smoke. She does not drink alcohol.  Family history:     There is a family history of coronary disease affecting the patient's brother and  sister.  Review of systems:    Patient denies fever, chills, headache, sweats, rash, change in vision, change in hearing, shortness of breath, nausea or vomiting, urinary symptoms. She does have discomfort in her left arm with motion. Overall she's had some fatigue and decreased appetite. All other systems are reviewed and are negative. Most Recent Cardiac Studies:    Surgical History:  Past Surgical History:  Procedure Laterality Date  . ABDOMINAL HYSTERECTOMY    . APPENDECTOMY    . BACK SURGERY    . CARPAL TUNNEL RELEASE    . CHOLECYSTECTOMY    . CORONARY ARTERY BYPASS GRAFT N/A 11/26/2012   Procedure: CORONARY ARTERY BYPASS GRAFTING (CABG);  Surgeon: Grace Isaac, MD;  Location: [facility];  Service: Open Heart Surgery;  Laterality: N/A;  . INTRAOPERATIVE TRANSESOPHAGEAL ECHOCARDIOGRAM N/A 11/26/2012   Procedure: INTRAOPERATIVE TRANSESOPHAGEAL ECHOCARDIOGRAM;  Surgeon: Grace Isaac, MD;  Location: [place];  Service: Open Heart Surgery;  Laterality: N/A;  . KNEE ARTHROSCOPY     bilateral  . LEFT HEART CATHETERIZATION WITH CORONARY ANGIOGRAM N/A 11/25/2012   Procedure: LEFT HEART CATHETERIZATION WITH CORONARY ANGIOGRAM;  Surgeon: Larey Dresser, MD;  Location: [facility] CATH LAB;  Service: Cardiovascular;  Laterality: N/A;  . LOOP RECORDER IMPLANT N/A 12/10/2013   Procedure: LOOP RECORDER IMPLANT;  Surgeon: Coralyn Mark, MD;  Location: [place] CATH LAB;  Service:  Cardiovascular;  Laterality: N/A;  . NECK SURGERY       Home Meds: Prior to Admission medications   Medication Sig Start Date End Date Taking? Authorizing Provider  aspirin EC 325 MG EC tablet Take 1 tablet (325 mg total) by mouth daily. 12/11/13   Nita Sells, MD  cycloSPORINE (RESTASIS) 0.05 % ophthalmic emulsion Place 1 drop into both eyes 2 (two) times daily. 02/28/16   Claretta Fraise, MD  diclofenac sodium (VOLTAREN) 1 % GEL Apply 4 g topically 4 (four) times daily. 08/26/15   Timmothy Euler, MD  glipiZIDE (GLUCOTROL) 5 MG  tablet Take 5 mg by mouth 2 (two) times daily before a meal.    Historical Provider, MD  glucose blood (ONE TOUCH ULTRA TEST) test strip Test one time a day and prn  Dx E11.9 11/30/14   Mary-Margaret Hassell Done, FNP  Lancets [facility] ULTRASOFT) lancets Test one time a day and prn  Dx e11.9 11/30/14   Mary-Margaret Hassell Done, FNP  levothyroxine (SYNTHROID, LEVOTHROID) 100 MCG tablet TAKE 1 TABLET (100 MCG TOTAL) BY MOUTH DAILY. 11/14/15   Timmothy Euler, MD  meclizine (ANTIVERT) 25 MG tablet TAKE 1 TABLET (25 MG TOTAL) BY MOUTH 2 (TWO) TIMES DAILY. Patient taking differently: Take 25 mg by mouth 2 (two) times daily as needed for dizziness.  11/11/15   Timmothy Euler, MD  metFORMIN (GLUCOPHAGE) 1000 MG tablet Take 1 tablet (1,000 mg total) by mouth 2 (two) times daily with a meal. 12/07/15   Timmothy Euler, MD  nystatin (MYCOSTATIN/NYSTOP) powder Apply topically 4 (four) times daily. 12/05/15   Merrily Pew, MD  omeprazole (PRILOSEC) 20 MG capsule TAKE 1 CAPSULE (20 MG TOTAL) BY MOUTH DAILY. 03/30/15   Timmothy Euler, MD  propranolol (INDERAL) 60 MG tablet TAKE  (1)  TABLET TWICE A DAY. 02/10/16   Timmothy Euler, MD  propranolol ER (INDERAL LA) 80 MG 24 hr capsule Take 1 capsule (80 mg total) by mouth daily. 03/07/15   Orson Eva, MD  tiotropium (SPIRIVA) 18 MCG inhalation capsule Place 18 mcg into inhaler and inhale daily.    Historical Provider, MD  traMADol (ULTRAM) 50 MG tablet Take 50 mg by mouth every 6 (six) hours as needed for moderate pain or severe pain.    Historical Provider, MD  traZODone (DESYREL) 100 MG tablet Take 1-2 tablets (100-200 mg total) by mouth at bedtime. 02/21/16   Timmothy Euler, MD    Inpatient Medications:  . aspirin EC  325 mg Oral Daily  . cycloSPORINE  1 drop Both Eyes BID  . enoxaparin (LOVENOX) injection  40 mg Subcutaneous Daily  . insulin aspart  0-9 Units Subcutaneous TID WC  . levothyroxine  100 mcg Oral QAC breakfast  . pantoprazole  40 mg Oral Daily  .  propranolol  60 mg Oral BID  . traZODone  100-200 mg Oral QHS    Allergies:  Allergies  Allergen Reactions  . Celebrex [Celecoxib] Other (See Comments)    Muscle aches and leg pains  . Fenofibrate Other (See Comments)    Muscle aches and leg pains  . Statins Other (See Comments)    Myalgias; muscle aches and leg pains    Social History   Social History  . Marital status: Widowed    Spouse name: Shanon Brow  . Number of children: 2  . Years of education: 11   Occupational History  . Retired     Administrator, sports   Social History Main Topics  .  Smoking status: Never Smoker  . Smokeless tobacco: Never Used  . Alcohol use No  . Drug use: No  . Sexual activity: No   Other Topics Concern  . Not on file   Social History Narrative   Lives in [place] with her grandchildren.  She is widowed.  Her husband died in 2008-07-07.       Family History  Problem Relation Age of Onset  . Ovarian cancer Mother   . Diabetes Father   . Pneumonia Father   . Diabetes Sister   . Stroke Daughter   . Heart attack Sister   . Heart attack Brother       Labs:  Recent Labs  03/06/16 0308  TROPONINI <0.03   Lab Results  Component Value Date   WBC 8.0 03/06/2016   HGB 12.6 03/06/2016   HCT 39.9 03/06/2016   MCV 81.8 03/06/2016   PLT 203 03/06/2016    Recent Labs Lab 03/05/16 2225 03/06/16 0308  NA 135  --   K 4.3  --   CL 104  --   CO2 22  --   BUN 12  --   CREATININE 1.09* 1.11*  CALCIUM 9.0  --   GLUCOSE 215*  --    Lab Results  Component Value Date   CHOL 244 (H) 08/26/2015   HDL 37 (L) 08/26/2015   LDLCALC Comment 08/26/2015   TRIG 449 (H) 08/26/2015   Lab Results  Component Value Date   DDIMER 2.54 (H) 12/14/2012    Radiology/Studies:  Dg Chest 2 View  Result Date: 03/05/2016 CLINICAL DATA:  82 year old female with chest pain. EXAM: CHEST  2 VIEW COMPARISON:  Chest radiograph dated 12/05/2015 and 03/05/2015 FINDINGS: The lungs are clear. There is no pleural  effusion or pneumothorax. Stable left mid lung field granuloma. There is stable mild cardiomegaly. Median sternotomy wires, CABG vascular clips and a loop recorder is noted. The bones are osteopenic. There is degenerative changes of the spine. No acute fracture. IMPRESSION: No acute cardiopulmonary process. Stable mild cardiomegaly. Electronically Signed   By: Anner Crete M.D.   On: 03/05/2016 23:14    EKG: I have reviewed old and current EKGs. There is an old Q-wave in lead 3. There are old nonspecific T-wave changes in V1 to V3. There is no acute change.  Telemetry:    I personally reviewed telemetry. She has sinus rhythm with sinus bradycardia.  Physical Exam: Blood pressure (!) 101/54, pulse 64, temperature 97.6 F (36.4 C), temperature source Oral, resp. rate 14, height 5' (1.524 m), weight 173 lb 11.2 oz (78.8 kg), SpO2 96 %.  The patient was sleepy early this morning but responded appropriately. There were several family members in the room. Head was atraumatic. Skin revealed no significant abnormalities. Lungs revealed a few scattered rhonchi. Cardiac exam revealed an S1 and S2. There was slight discomfort to palpation of the chest. When lifting her right arm there was no discomfort. When lifting her left arm she had discomfort in the left upper arm. She says that this is the discomfort she has been having. Abdomen is soft. There was no peripheral edema. Patient was oriented to person time and place.  Assessment and Plan:     Chest pain     At this point there is no enzyme evidence or EKG evidence of an acute ischemic syndrome. There is a significant component of arm discomfort. The patient has documented coronary disease, and therefore it is important to be sure that  this chest discomfort is not cardiac. However, the history from her is vague. There is definite discomfort with moving her left arm.  2-D echo has been ordered. If she has had significant change in left ventricular  dysfunction, more complete workup will be indicated. I would proceed with further evaluation of the discomfort in her left arm. Plan to watch the complete set of her enzymes. Observation in the hospital today is appropriate. Based on how she does, further cardiology recommendations will follow. The patient is on aspirin. By history she is statin intolerant.    S/P CABG x 4    Hypothyroidism     I do not see a TSH in the current labs. I ordered one for this morning.    CKD stage 3 due to type 2 diabetes mellitus (HCC)    Arm pain, anterior, left      I have outlined above that discomfort in her left arm appears to be part of her current symptoms. By history the patient did have a recent fall in November, 2017.   Signed, Daryel November, MD, [facility] 03/06/2016, 7:08 AM

## 2016-03-06 NOTE — Progress Notes (Signed)
  Echocardiogram 2D Echocardiogram has been performed.  Leah Levine 03/06/2016, 8:42 AM

## 2016-03-07 DIAGNOSIS — E1122 Type 2 diabetes mellitus with diabetic chronic kidney disease: Secondary | ICD-10-CM

## 2016-03-07 DIAGNOSIS — R079 Chest pain, unspecified: Secondary | ICD-10-CM

## 2016-03-07 DIAGNOSIS — Z951 Presence of aortocoronary bypass graft: Secondary | ICD-10-CM

## 2016-03-07 DIAGNOSIS — E039 Hypothyroidism, unspecified: Secondary | ICD-10-CM

## 2016-03-07 DIAGNOSIS — N183 Chronic kidney disease, stage 3 (moderate): Secondary | ICD-10-CM

## 2016-03-07 LAB — GLUCOSE, CAPILLARY
GLUCOSE-CAPILLARY: 242 mg/dL — AB (ref 65–99)
Glucose-Capillary: 140 mg/dL — ABNORMAL HIGH (ref 65–99)

## 2016-03-07 LAB — TROPONIN I: Troponin I: <0.03 ng/mL (ref <0.03)

## 2016-03-07 NOTE — Evaluation (Signed)
Occupational Therapy Evaluation and Discharge  Patient Details Name: Leah Levine MRN: [id number] DOB: Aug 01, 1932 Today's Date: 03/07/2016    History of Present Illness 82 y.o. female with history of CAD status post CABG, stroke, diabetes mellitus, hypothyroidism and hypertension presents to the ER with complaints of chest pain.   Clinical Impression   Per daughter, pt required assist with ADL PTA. Currently pt overall min guard for functional mobility, min assist for seated UB ADL, and max assist for LB ADL. Pt reports L shoulder pain resolved at this time; noted to have full AROM, strength grossly 4/5. Pts daughter reports pt is currently at her functional baseline, they have all equipment that they need at home, and plan to resume [facility] PT/OT/SLP services upon d/c. Feel continuing [place] therapies is appropriate upon d/c home. All further OT needs can be met at the next venue of care; will sign off at this time. Please re-consult if needs change. Thank you for this referral.    Follow Up Recommendations  Home health OT;Supervision/Assistance - 24 hour    Equipment Recommendations  None recommended by OT    Recommendations for Other Services       Precautions / Restrictions Precautions Precautions: Fall Restrictions Weight Bearing Restrictions: No      Mobility Bed Mobility Overal bed mobility: Needs Assistance Bed Mobility: Supine to Sit;Sit to Supine     Supine to sit: Min assist Sit to supine: Min assist   General bed mobility comments: Min assist for trunk elevation to sitting and assist for LEs back to bed. Daughter able to demo assist for bed mobility.   Transfers Overall transfer level: Needs assistance Equipment used: 1 person hand held assist Transfers: Sit to/from Stand Sit to Stand: Min assist         General transfer comment: Min assist to steady in standing until RW infront of her.    Balance Overall balance assessment: Needs  assistance Sitting-balance support: Feet supported;Bilateral upper extremity supported Sitting balance-Leahy Scale: Fair     Standing balance support: Single extremity supported Standing balance-Leahy Scale: Fair                              ADL Overall ADL's : Needs assistance/impaired;At baseline Eating/Feeding: Set up;Sitting   Grooming: Min guard;Standing   Upper Body Bathing: Minimal assistance;Sitting   Lower Body Bathing: Maximal assistance;Sit to/from stand   Upper Body Dressing : Minimal assistance;Sitting   Lower Body Dressing: Maximal assistance;Sit to/from stand   Toilet Transfer: Min guard;Ambulation;Regular Toilet;RW Toilet Transfer Details (indicate cue type and reason): Simulated by sit to stand from EOB with functional mobility         Functional mobility during ADLs: Min guard;Rolling walker General ADL Comments: Pts daughter present; reports she feels pt currently at functional baseline. Pt has been receiving HH PT/OT/SLP PTA; plan to resume [place] services upon return home.     Vision Vision Assessment?: No apparent visual deficits   Perception     Praxis      Pertinent Vitals/Pain Pain Assessment: No/denies pain     Hand Dominance Right   Extremity/Trunk Assessment Upper Extremity Assessment Upper Extremity Assessment: Generalized weakness (LUE: full AROM, strength 4/5, no pain)   Lower Extremity Assessment Lower Extremity Assessment: Generalized weakness   Cervical / Trunk Assessment Cervical / Trunk Assessment: Kyphotic   Communication Communication Communication: No difficulties   Cognition Arousal/Alertness: Awake/alert Behavior During Therapy: Flat affect Overall  Cognitive Status: Within Functional Limits for tasks assessed                     General Comments       Exercises       Shoulder Instructions      Home Living Family/patient expects to be discharged to:: Private residence Living Arrangements:  Children Available Help at Discharge: Family;Available 24 hours/day Type of Home: House Home Access: Stairs to enter [employer] of Steps: 2   Home Layout: One level     Bathroom Shower/Tub: Tub/shower unit Shower/tub characteristics: Curtain Biochemist, clinical: Standard     Home Equipment: Environmental consultant - 4 wheels;Bedside commode;Shower seat;Wheelchair - manual;Grab bars - tub/shower          Prior Functioning/Environment Level of Independence: Needs assistance  Gait / Transfers Assistance Needed: rollator for mobility with family supervision ADL's / Homemaking Assistance Needed: assist for bathing, dressing, tub transfers            OT Problem List:     OT Treatment/Interventions:      OT Goals(Current goals can be found in the care plan section) Acute Rehab OT Goals Patient Stated Goal: return home OT Goal Formulation: All assessment and education complete, DC therapy  OT Frequency:     Barriers to D/C:            Co-evaluation              End of Session Equipment Utilized During Treatment: Rolling walker Nurse Communication: Mobility status;Other (comment) (pt ready for d/c from OT standpoint)  Activity Tolerance: Patient tolerated treatment well Patient left: in bed;with call bell/phone within reach;with family/visitor present   Time: 1828-8337 OT Time Calculation (min): 14 min Charges:  OT General Charges $OT Visit: 1 Procedure OT Evaluation $OT Eval Moderate Complexity: 1 Procedure G-Codes: OT G-codes **NOT FOR INPATIENT CLASS** Functional Assessment Tool Used: Clinical judgement Functional Limitation: Self care Self Care Current Status (O4514): At least 20 percent but less than 40 percent impaired, limited or restricted Self Care Goal Status (U0479): At least 20 percent but less than 40 percent impaired, limited or restricted Self Care Discharge Status [phone]): At least 20 percent but less than 40 percent impaired, limited or restricted    Binnie Kand M.S., OTR/L Pager: [phone]  03/07/2016, 10:34 AM

## 2016-03-07 NOTE — Discharge Summary (Signed)
Physician Discharge Summary  Leah Levine ZOX:[id number] DOB: [birth year] DOA: 03/05/2016  PCP: Leah Fenton, MD  Admit date: 03/05/2016 Discharge date: 03/07/2016   Recommendations for Outpatient Follow-Up:   1. Resume home health   Discharge Diagnosis:   Principal Problem:   Chest pain Active Problems:   S/P CABG x 4   Hypothyroidism   CKD stage 3 due to type 2 diabetes mellitus (HCC)   Arm pain, anterior, left   Discharge disposition:  Home.  Discharge Condition: Improved.  Diet recommendation: Low sodium, heart healthy  Wound care: None.   History of Present Illness:    Leah Levine is a 82 y.o. female with history of CAD status post CABG, stroke, diabetes mellitus, hypothyroidism and hypertension presents to the ER with complaints of chest pain. Patient has been experiencing chest pain over the last 3-4 days. Patient is presently in no distress. Pain is left anterior chest wall with no radiation no associated shortness of breath. Chest pain is not sharp shooting last for a few minutes each time. In the ER EKG and chest x-ray and cardiac markers were negative patient is chest pain-free at this time. Patient is being admitted for further workup for chest pain.     Hospital Course by Problem:   Chest pain     -2-D echo was done showing normal left ventricular function. -resolved -non-cardiac    S/P CABG x 4    Hypothyroidism    TSH mildly high--- outpatient follow up with PCP     CKD stage 3 due to type 2 diabetes mellitus (HCC)    Arm pain, anterior, left     continue home PT/OT -x ray negative for fracture    Medical Consultants:    cards   Discharge Exam:   Vitals:   03/07/16 0514 03/07/16 0614  BP: 112/65 104/66  Pulse: 69 61  Resp: 16 17  Temp:     Vitals:   03/07/16 0312 03/07/16 0414 03/07/16 0514 03/07/16 0614  BP: (!) 144/71 122/65 112/65 104/66  Pulse: 61 61 69 61  Resp: 15 19 16 17   Temp: 98.1  F (36.7 C)     TempSrc: Oral     SpO2: 98% 93% 96% 95%  Weight: 78.7 kg (173 lb 8 oz)     Height:        Gen:  NAD   The results of significant diagnostics from this hospitalization (including imaging, microbiology, ancillary and laboratory) are listed below for reference.     Procedures and Diagnostic Studies:   Dg Chest 2 View  Result Date: 03/05/2016 CLINICAL DATA:  82 year old female with chest pain. EXAM: CHEST  2 VIEW COMPARISON:  Chest radiograph dated 12/05/2015 and 03/05/2015 FINDINGS: The lungs are clear. There is no pleural effusion or pneumothorax. Stable left mid lung field granuloma. There is stable mild cardiomegaly. Median sternotomy wires, CABG vascular clips and a loop recorder is noted. The bones are osteopenic. There is degenerative changes of the spine. No acute fracture. IMPRESSION: No acute cardiopulmonary process. Stable mild cardiomegaly. Electronically Signed   By: Elgie Collard M.D.   On: 03/05/2016 23:14   Dg Shoulder Left  Result Date: 03/06/2016 CLINICAL DATA:  Left shoulder pain for 1 day EXAM: LEFT SHOULDER - 2+ VIEW COMPARISON:  None. FINDINGS: No acute fracture. No dislocation. Moderate degenerative change and inferior osteophytes at the [facility] joint. Left upper lobe calcified granuloma. Osteopenia. IMPRESSION: Negative. Electronically Signed   By: Dahlia Client.D.  On: 03/06/2016 16:02     Labs:   Basic Metabolic Panel:  Recent Labs Lab 03/05/16 2225 03/06/16 0308  NA 135  --   K 4.3  --   CL 104  --   CO2 22  --   GLUCOSE 215*  --   BUN 12  --   CREATININE 1.09* 1.11*  CALCIUM 9.0  --    GFR Estimated Creatinine Clearance: 36.9 mL/min (by C-G formula based on SCr of 1.11 mg/dL (H)). Liver Function Tests: No results for input(s): AST, ALT, ALKPHOS, BILITOT, PROT, ALBUMIN in the last 168 hours. No results for input(s): LIPASE, AMYLASE in the last 168 hours. No results for input(s): AMMONIA in the last 168 hours. Coagulation  profile No results for input(s): INR, PROTIME in the last 168 hours.  CBC:  Recent Labs Lab 03/05/16 2225 03/06/16 0308  WBC 9.3 8.0  HGB 12.5 12.6  HCT 38.4 39.9  MCV 80.2 81.8  PLT 201 203   Cardiac Enzymes:  Recent Labs Lab 03/06/16 0308 03/06/16 0829 03/06/16 1600 03/07/16 0343  TROPONINI <0.03 <0.03 0.05* <0.03   BNP: Invalid input(s): POCBNP CBG:  Recent Labs Lab 03/06/16 0735 03/06/16 1104 03/06/16 1612 03/06/16 2116 03/07/16 0726  GLUCAP 176* 108* 192* 208* 140*   D-Dimer No results for input(s): DDIMER in the last 72 hours. Hgb A1c No results for input(s): HGBA1C in the last 72 hours. Lipid Profile No results for input(s): CHOL, HDL, LDLCALC, TRIG, CHOLHDL, LDLDIRECT in the last 72 hours. Thyroid function studies  Recent Labs  03/06/16 0829  TSH 5.368*   Anemia work up No results for input(s): VITAMINB12, FOLATE, FERRITIN, TIBC, IRON, RETICCTPCT in the last 72 hours. Microbiology No results found for this or any previous visit (from the past 240 hour(s)).   Discharge Instructions:   Discharge Instructions    Diet - low sodium heart healthy    Complete by:  As directed    Diet Carb Modified    Complete by:  As directed    Discharge instructions    Complete by:  As directed    Resume home health   Increase activity slowly    Complete by:  As directed        Medication List    STOP taking these medications   diclofenac sodium 1 % Gel Commonly known as:  VOLTAREN   glucose blood test strip Commonly known as:  ONE TOUCH ULTRA TEST   meclizine 25 MG tablet Commonly known as:  ANTIVERT   nystatin powder Commonly known as:  MYCOSTATIN/NYSTOP   onetouch ultrasoft lancets     TAKE these medications   aspirin 325 MG EC tablet Take 1 tablet (325 mg total) by mouth daily.   cycloSPORINE 0.05 % ophthalmic emulsion Commonly known as:  RESTASIS Place 1 drop into both eyes 2 (two) times daily.   levothyroxine 100 MCG  tablet Commonly known as:  SYNTHROID, LEVOTHROID TAKE 1 TABLET (100 MCG TOTAL) BY MOUTH DAILY.   metFORMIN 1000 MG tablet Commonly known as:  GLUCOPHAGE Take 1 tablet (1,000 mg total) by mouth 2 (two) times daily with a meal.   omeprazole 20 MG capsule Commonly known as:  PRILOSEC TAKE 1 CAPSULE (20 MG TOTAL) BY MOUTH DAILY.   propranolol 60 MG tablet Commonly known as:  INDERAL TAKE  (1)  TABLET TWICE A DAY. What changed:  See the new instructions.   tiotropium 18 MCG inhalation capsule Commonly known as:  SPIRIVA Place 18 mcg into  inhaler and inhale daily.   traMADol 50 MG tablet Commonly known as:  ULTRAM Take 50 mg by mouth every 6 (six) hours as needed for moderate pain or severe pain.   traZODone 100 MG tablet Commonly known as:  DESYREL Take 1-2 tablets (100-200 mg total) by mouth at bedtime.         Time coordinating discharge: 35 min  Signed:  Bennye Nix U Joe Gee   [facility] 03/07/2016, 9:42 AM

## 2016-03-07 NOTE — Progress Notes (Signed)
Patient Name: Leah Levine Date of Encounter: 03/07/2016  Primary Cardiologist:   Hospital Problem List     Principal Problem:   Chest pain Active Problems:   S/P CABG x 4   Hypothyroidism   CKD stage 3 due to type 2 diabetes mellitus (HCC)   Arm pain, anterior, left     Subjective   The patient is feeling much better today. She's had no recurrent significant chest pain.  Inpatient Medications    Scheduled Meds: . aspirin EC  325 mg Oral Daily  . cycloSPORINE  1 drop Both Eyes BID  . enoxaparin (LOVENOX) injection  40 mg Subcutaneous Daily  . insulin aspart  0-9 Units Subcutaneous TID WC  . levothyroxine  100 mcg Oral QAC breakfast  . pantoprazole  40 mg Oral Daily  . propranolol  60 mg Oral BID  . traZODone  100-200 mg Oral QHS   Continuous Infusions:  PRN Meds: acetaminophen, ondansetron (ZOFRAN) IV   Vital Signs    Vitals:   03/07/16 0312 03/07/16 0414 03/07/16 0514 03/07/16 0614  BP: (!) 144/71 122/65 112/65 104/66  Pulse: 61 61 69 61  Resp: 15 19 16 17   Temp: 98.1 F (36.7 C)     TempSrc: Oral     SpO2: 98% 93% 96% 95%  Weight: 173 lb 8 oz (78.7 kg)     Height:        Intake/Output Summary (Last 24 hours) at 03/07/16 1005 Last data filed at 03/07/16 0926  Gross per 24 hour  Intake              924 ml  Output              650 ml  Net              274 ml   Filed Weights   03/06/16 0152 03/06/16 0455 03/07/16 0312  Weight: 173 lb 11.2 oz (78.8 kg) 173 lb 11.2 oz (78.8 kg) 173 lb 8 oz (78.7 kg)    Physical Exam   The patient is oriented this morning. I spoke also with her family member in the room. She's feeling well. When lifting her left arm, she says she has no discomfort today. Lungs are clear. Respiratory effort is nonlabored. Cardiac exam reveals S1 and S2. The abdomen is soft. There is no peripheral edema.  Labs    CBC  Recent Labs  03/05/16 2225 03/06/16 0308  WBC 9.3 8.0  HGB 12.5 12.6  HCT 38.4 39.9  MCV 80.2  81.8  PLT 201 [id number]   Basic Metabolic Panel  Recent Labs  03/05/16 2225 03/06/16 0308  NA 135  --   K 4.3  --   CL 104  --   CO2 22  --   GLUCOSE 215*  --   BUN 12  --   CREATININE 1.09* 1.11*  CALCIUM 9.0  --    Liver Function Tests No results for input(s): AST, ALT, ALKPHOS, BILITOT, PROT, ALBUMIN in the last 72 hours. No results for input(s): LIPASE, AMYLASE in the last 72 hours. Cardiac Enzymes  Recent Labs  03/06/16 0829 03/06/16 1600 03/07/16 0343  TROPONINI <0.03 0.05* <0.03   BNP Invalid input(s): POCBNP D-Dimer No results for input(s): DDIMER in the last 72 hours. Hemoglobin A1C No results for input(s): HGBA1C in the last 72 hours. Fasting Lipid Panel No results for input(s): CHOL, HDL, LDLCALC, TRIG, CHOLHDL, LDLDIRECT in the last 72 hours. Thyroid Function Tests  Recent Labs  03/06/16 0829  TSH 5.368*    Telemetry     - Personally Reviewed. There is normal sinus rhythm.  ECG      Radiology    Dg Chest 2 View  Result Date: 03/05/2016 CLINICAL DATA:  82 year old female with chest pain. EXAM: CHEST  2 VIEW COMPARISON:  Chest radiograph dated 12/05/2015 and 03/05/2015 FINDINGS: The lungs are clear. There is no pleural effusion or pneumothorax. Stable left mid lung field granuloma. There is stable mild cardiomegaly. Median sternotomy wires, CABG vascular clips and a loop recorder is noted. The bones are osteopenic. There is degenerative changes of the spine. No acute fracture. IMPRESSION: No acute cardiopulmonary process. Stable mild cardiomegaly. Electronically Signed   By: Anner Crete M.D.   On: 03/05/2016 23:14   Dg Shoulder Left  Result Date: 03/06/2016 CLINICAL DATA:  Left shoulder pain for 1 day EXAM: LEFT SHOULDER - 2+ VIEW COMPARISON:  None. FINDINGS: No acute fracture. No dislocation. Moderate degenerative change and inferior osteophytes at the [facility] joint. Left upper lobe calcified granuloma. Osteopenia. IMPRESSION: Negative.  Electronically Signed   By: Marybelle Killings M.D.   On: 03/06/2016 16:02    Cardiac Studies   2-D echo done similar 12th, 2017, revealed normal LV function and no focal wall motion abnormalities.  Patient Profile     This elderly patient with known coronary disease presented with some chest discomfort and arm discomfort. There's been no evidence of ischemia or injury. Yesterday she had significant pain with motion of the left arm. Today this is significantly improved. I feel this arm pain was not cardiac in origin.  Assessment & Plan      Chest pain      At this point there is no proof of cardiac ischemia. I am recommending no further cardiac workup in the hospital. She can be discharged home from the cardiology viewpoint. It is difficult to know how reliable her history is. This will have to be kept in mind as we assess her going forward. 2-D echo was done showing normal left ventricular function.    S/P CABG x 4    Hypothyroidism    TSH was checked yesterday. There is very slight elevation. I have not made any change in her medications.     CKD stage 3 due to type 2 diabetes mellitus (HCC)    Arm pain, anterior, left     Her arm pain is improved. It is not completely clear to me how it improved so rapidly. However I feel it was not cardiac in origin. X-ray was done showing no fracture.  Signed, Dola Argyle, MD  03/07/2016, 10:05 AM

## 2016-03-15 ENCOUNTER — Encounter: Payer: Self-pay | Admitting: Family Medicine

## 2016-03-15 ENCOUNTER — Ambulatory Visit (INDEPENDENT_AMBULATORY_CARE_PROVIDER_SITE_OTHER): Payer: Medicare Other | Admitting: Family Medicine

## 2016-03-15 VITALS — BP 147/77 | HR 66 | Temp 97.5°F | Ht 61.52 in | Wt 175.0 lb

## 2016-03-15 DIAGNOSIS — I6930 Unspecified sequelae of cerebral infarction: Secondary | ICD-10-CM | POA: Diagnosis not present

## 2016-03-15 DIAGNOSIS — R197 Diarrhea, unspecified: Secondary | ICD-10-CM | POA: Diagnosis not present

## 2016-03-15 DIAGNOSIS — R350 Frequency of micturition: Secondary | ICD-10-CM

## 2016-03-15 LAB — CUP PACEART REMOTE DEVICE CHECK
Implantable Pulse Generator Implant Date: 20150917
MDC IDC SESS DTM: 20171106043753

## 2016-03-15 NOTE — Progress Notes (Signed)
Carelink summary report received. Battery status OK. Normal device function. No new symptom episodes, tachy episodes, brady, or pause episodes. No new AF episodes. Monthly summary reports and ROV/PRN 

## 2016-03-15 NOTE — Progress Notes (Signed)
   HPI  Patient presents today here with frequent urination and diarrhea.  Patient's had symptoms for 1-1/2 weeks. She denies dysuria but states that she has urinary frequency and increased incontinence. He denies fever, chills, back pain, or abdominal pain  Diarrhea 3-5 loose stools daily and sleeping the hospital. No abdominal pain Decreased appetite but tolerating food and fluids normally. No fevers, chills, sweats. Stool is described as watery and very foul-smelling. She has not had any recent antibiotics  PMH: Smoking status noted ROS: Per HPI  Objective: BP (!) 147/77   Pulse 66   Temp 97.5 F (36.4 C) (Oral)   Ht 5' 1.52" (1.563 m)   Wt 175 lb (79.4 kg)   BMI 32.51 kg/m  Gen: NAD, alert, cooperative with exam HEENT: NCAT CV: RRR, good S1/S2, no murmur Resp: CTABL, no wheezes, non-labored Abd: SNTND, BS present, no guarding or organomegaly, no CVA tenderness,  no suprapubic tenderness Ext: No edema, warm Neuro: Alert and oriented, No gross deficits  Assessment and plan:  # Urinary frequency Possible UTI, however given age and concomitant diarrhea I have recommended testing her urine before treating. Urine dipstick and urine culture ordered She appears nontoxic and is tolerating fluids easily.  Pt unable to give urine sample today, sent home with hat and cup  # Diarrhea New problem Loose watery stools that are foul-smelling, C. difficile tested to rule out Discussed plenty of fluids, avoiding heavily sugar sweetened fluids with diabetes and diarrhea. Low threshold for return    Orders Placed This Encounter  Procedures  . Urine culture  . Clostridium Difficile by PCR    Order Specific Question:   Is your patient experiencing loose or watery stools (3 or more in 24 hours)?    Answer:   Yes    Order Specific Question:   Has the patient received laxatives in the last 24 hours?    Answer:   No    Order Specific Question:   Has a negative Cdiff test resulted  in the last 7 days?    Answer:   No  . Urinalysis     Laroy Apple, MD [facility] 03/15/2016, 9:54 AM

## 2016-03-15 NOTE — Patient Instructions (Signed)
Great to see you!  We will let yo uknow what your urine and stool samples say as they become available.   Call or come back with any questions.

## 2016-03-15 NOTE — Addendum Note (Signed)
Addended by: Nigel Berthold C on: 03/15/2016 10:06 AM   Modules accepted: Orders

## 2016-03-20 NOTE — Telephone Encounter (Signed)
Pt was seen in office 03/14/16 with Dr. Wendi Snipes, will close encounter.

## 2016-03-25 ENCOUNTER — Ambulatory Visit (INDEPENDENT_AMBULATORY_CARE_PROVIDER_SITE_OTHER): Payer: Medicare Other | Admitting: Family Medicine

## 2016-03-25 DIAGNOSIS — M6281 Muscle weakness (generalized): Secondary | ICD-10-CM | POA: Diagnosis not present

## 2016-03-25 DIAGNOSIS — N183 Chronic kidney disease, stage 3 (moderate): Secondary | ICD-10-CM | POA: Diagnosis not present

## 2016-03-25 DIAGNOSIS — I69392 Facial weakness following cerebral infarction: Secondary | ICD-10-CM | POA: Diagnosis not present

## 2016-03-25 DIAGNOSIS — I951 Orthostatic hypotension: Secondary | ICD-10-CM

## 2016-03-25 DIAGNOSIS — G250 Essential tremor: Secondary | ICD-10-CM

## 2016-03-25 DIAGNOSIS — I2510 Atherosclerotic heart disease of native coronary artery without angina pectoris: Secondary | ICD-10-CM

## 2016-03-25 DIAGNOSIS — I10 Essential (primary) hypertension: Secondary | ICD-10-CM | POA: Diagnosis not present

## 2016-03-25 DIAGNOSIS — I69320 Aphasia following cerebral infarction: Secondary | ICD-10-CM

## 2016-03-25 DIAGNOSIS — I69398 Other sequelae of cerebral infarction: Secondary | ICD-10-CM

## 2016-03-25 DIAGNOSIS — E1122 Type 2 diabetes mellitus with diabetic chronic kidney disease: Secondary | ICD-10-CM

## 2016-03-25 DIAGNOSIS — E1165 Type 2 diabetes mellitus with hyperglycemia: Secondary | ICD-10-CM

## 2016-03-29 ENCOUNTER — Ambulatory Visit (INDEPENDENT_AMBULATORY_CARE_PROVIDER_SITE_OTHER): Payer: Medicare Other | Admitting: *Deleted

## 2016-03-29 DIAGNOSIS — R55 Syncope and collapse: Secondary | ICD-10-CM | POA: Diagnosis not present

## 2016-03-30 NOTE — Progress Notes (Signed)
[facility] Summary Report / Loop Recorder 

## 2016-04-03 ENCOUNTER — Telehealth: Payer: Self-pay | Admitting: Family Medicine

## 2016-04-03 NOTE — Telephone Encounter (Signed)
Called and LM with therapist

## 2016-04-04 ENCOUNTER — Telehealth: Payer: Self-pay | Admitting: Family Medicine

## 2016-04-04 NOTE — Telephone Encounter (Signed)
Daughter would like to get a hospital bed for her mother. Daughter states that patient has been falling a lot and she is afraid she is going to fall and get hurt. Daughter also states that patient has been getting out of bed without help and she thinks a hospital bed will work better for her. Please advise.

## 2016-04-05 ENCOUNTER — Ambulatory Visit: Payer: Medicare Other | Admitting: Family Medicine

## 2016-04-05 NOTE — Telephone Encounter (Signed)
Will discuss at [place] today  Laroy Apple, MD [facility] 04/05/2016, 7:45 AM

## 2016-04-09 ENCOUNTER — Encounter: Payer: Self-pay | Admitting: Family Medicine

## 2016-04-15 LAB — CUP PACEART REMOTE DEVICE CHECK
MDC IDC PG IMPLANT DT: 20150917
MDC IDC SESS DTM: 20171206051315

## 2016-04-15 NOTE — Progress Notes (Signed)
Carelink summary report received. Battery status OK. Normal device function. No new symptom episodes, tachy episodes, brady, or pause episodes. No new AF episodes. Monthly summary reports and ROV/PRN 

## 2016-04-20 ENCOUNTER — Ambulatory Visit (INDEPENDENT_AMBULATORY_CARE_PROVIDER_SITE_OTHER): Payer: Medicare Other | Admitting: Family Medicine

## 2016-04-20 ENCOUNTER — Ambulatory Visit (INDEPENDENT_AMBULATORY_CARE_PROVIDER_SITE_OTHER): Payer: Medicare Other

## 2016-04-20 ENCOUNTER — Encounter: Payer: Self-pay | Admitting: Family Medicine

## 2016-04-20 VITALS — BP 130/70 | HR 66 | Temp 97.0°F | Ht 61.52 in | Wt 169.0 lb

## 2016-04-20 DIAGNOSIS — N183 Chronic kidney disease, stage 3 unspecified: Secondary | ICD-10-CM

## 2016-04-20 DIAGNOSIS — M545 Low back pain, unspecified: Secondary | ICD-10-CM

## 2016-04-20 DIAGNOSIS — L03213 Periorbital cellulitis: Secondary | ICD-10-CM | POA: Diagnosis not present

## 2016-04-20 DIAGNOSIS — E1122 Type 2 diabetes mellitus with diabetic chronic kidney disease: Secondary | ICD-10-CM

## 2016-04-20 LAB — BAYER DCA HB A1C WAIVED: HB A1C: 7.9 % — AB (ref <7.0)

## 2016-04-20 MED ORDER — CLINDAMYCIN HCL 300 MG PO CAPS: 300.0000 mg | ORAL_CAPSULE | Freq: Three times a day (TID) | ORAL | 0 refills | Status: DC

## 2016-04-20 NOTE — Patient Instructions (Signed)
Great to see you!  If you have any change in vision, Pain with moving your eye, or worsening of the redness on your face, please go to the emergency room.    Preseptal Cellulitis, Adult Introduction Preseptal cellulitis-also called periorbital cellulitis-is an infection that can affect your eyelid and the soft tissues or skin that surround your eye. The infection may also affect the structures that produce and drain your tears. It does not affect your eye itself. What are the causes? This condition may be caused by:  Bacterial infection.  Long-term (chronic) sinus infections.  An object (foreign body) that is stuck behind the eye.  An injury that:  Goes through the eyelid tissues.  Causes an infection, such as an insect sting.  Fracture of the bone around the eye.  Infections that have spread from the eyelid or other structures around the eye.  Bite wounds.  Inflammation or infection of the lining membranes of the brain (meningitis).  An infection in the blood (septicemia).  Dental infection (abscess).  Viral infection. This is rare. What increases the risk? Risk factors for preseptal cellulitis include:  Participating in activities that increase your risk of trauma to the face or head, such as boxing or high-speed activities.  Having a weakened defense system (immune system).  Medical conditions, such as nasal polyps, that increase your risk for frequent or recurrent sinus infections.  Not receiving regular dental care. What are the signs or symptoms? Symptoms of this condition usually come on suddenly. Symptoms may include:  Red, hot, and swollen eyelids.  Fever.  Difficulty opening your eye.  Eye pain. How is this diagnosed? This condition may be diagnosed by an eye exam. You may also have tests, such as:  Blood tests.  CT scan.  MRI.  Spinal tap (lumbar puncture). This is a procedure that involves removing and examining a small amount of the fluid  that surrounds the brain and spinal cord. This checks for meningitis. How is this treated? Treatment for this condition will include antibiotic medicines. These may be given by mouth (orally), through an IV, or as a shot. Your health care provider may also recommend nasal decongestants to reduce swelling. Follow these instructions at home:  Take your antibiotic medicine as directed by your health care provider. Finish all of it even if you start to feel better.  Take medicines only as directed by your health care provider.  Drink enough fluid to keep your urine clear or pale yellow.  Do not use any tobacco products, including cigarettes, chewing tobacco, or electronic cigarettes. If you need help quitting, ask your health care provider.  Keep all follow-up visits as directed by your health care provider. These include any visits with an eye specialist (ophthalmologist) or dentist. Contact a health care provider if:  You have a fever.  Your eyelids become more red, warm, or swollen.  You have new symptoms.  Your symptoms do not get better with treatment. Get help right away if:  You develop double vision, or your vision becomes blurred or worsens in any way.  You have trouble moving your eyes.  Your eye looks like it is sticking out or bulging out (proptosis).  You develop a severe headache, severe neck pain, or neck stiffness.  You develop repeated vomiting. This information is not intended to replace advice given to you by your health care provider. Make sure you discuss any questions you have with your health care provider. Document Released: 04/14/2010 Document Revised: 08/18/2015 Document  Reviewed: 03/08/2014  2017 Elsevier

## 2016-04-20 NOTE — Progress Notes (Signed)
HPI  Patient presents today with back pain and facial swelling.  Facial swelling Left sided peri-ocular swelling for 3 days. She has tenderness with palpation of the area but no change in vision, tenderness with movement of the eye, or double vision.  She denies any fever, chills, sweats. She has some discharge that is thick and green from the eye. Symptoms are only in the left eye.  Back pain Patient describes bilateral "hip pain, points to her back. States that 2 weeks ago she turned too fast in the kitchen and 1 foot tripped over the other causing her to land squarely on her bottom on the kitchen floor. She's had bilateral "hip pain" since that time. She does not have difficulty walking.   PMH: Smoking status noted ROS: Per HPI  Objective: BP 130/70   Pulse 66   Temp 97 F (36.1 C) (Oral)   Ht 5' 1.52" (1.563 m)   Wt 169 lb (76.7 kg)   BMI 31.39 kg/m  Gen: NAD, alert, cooperative with exam HEENT: NCAT, left thigh with surrounding edema and erythema, mild tenderness to palpation, palpation of the eyeball without any tenderness and with normal texture. EOMI, PERRLA, no conjunctival injection of either eye. Small amount of green to yellow discharge of the left eye present medially. CV: RRR, good S1/S2, no murmur Resp: CTABL, no wheezes, non-labored Ext: No edema, warm Neuro: Alert and oriented  MSK:  Bilateral tenderness of the paraspinal muscles and over the pelvic brim posteriorly, mild midline tenderness   Assessment and plan:  # Preseptal cellulitis Treat with clindamycin Discussed in detail the dangers of orbital cellulitis and signs of progression to orbital cellulitis. The patient understands to seek immediate emergent medical care if these develop. No signs of orbital cellulitis, no orbital tenderness to palpation over the eye, and with ocular movements, or change in vision.   # Bilateral lumbar back pain with no sciatica Acute onset after a fall, patient  also with some chronic back pain, she actually has an appointment with a spine surgeon next week. I doubt any acute bony abnormality, plain films of the pelvis and lumbar spine pending. Recommended supportive care for pain including ice or heat. Mobility appears normal  T2DM Previously controlled, suspect stable A1c pending  Q3 month labs given metformin + CKD 3   Orders Placed This Encounter  Procedures  . DG Lumbar Spine Complete    Standing Status:   Future    Number of Occurrences:   1    Standing Expiration Date:   06/18/2017    Order Specific Question:   Reason for Exam (SYMPTOM  OR DIAGNOSIS REQUIRED)    Answer:   Low back pain after fall    Order Specific Question:   Preferred imaging location?    Answer:   Internal  . DG Pelvis 1-2 Views    Standing Status:   Future    Number of Occurrences:   1    Standing Expiration Date:   06/20/2017    Order Specific Question:   Reason for Exam (SYMPTOM  OR DIAGNOSIS REQUIRED)    Answer:   Low back pain after fall, Pain over palp of BL SI joints    Order Specific Question:   Preferred imaging location?    Answer:   Internal  . BMP8+EGFR  . Bayer DCA Hb A1c Waived  . CBC with Differential/Platelet    Meds ordered this encounter  Medications  . clindamycin (CLEOCIN) 300 MG capsule  Sig: Take 1 capsule (300 mg total) by mouth 3 (three) times daily.    Dispense:  21 capsule    Refill:  [place], MD [facility] 04/20/2016, 11:56 AM

## 2016-04-21 LAB — BMP8+EGFR
BUN/Creatinine Ratio: 10 — ABNORMAL LOW (ref 12–28)
BUN: 12 mg/dL (ref 8–27)
CALCIUM: 9.6 mg/dL (ref 8.7–10.3)
CO2: 24 mmol/L (ref 18–29)
Chloride: 98 mmol/L (ref 96–106)
Creatinine, Ser: 1.17 mg/dL — ABNORMAL HIGH (ref 0.57–1.00)
GFR, EST AFRICAN AMERICAN: 50 mL/min/{1.73_m2} — AB (ref >59)
GFR, EST NON AFRICAN AMERICAN: 44 mL/min/{1.73_m2} — AB (ref >59)
Glucose: 123 mg/dL — ABNORMAL HIGH (ref 65–99)
Potassium: 4.7 mmol/L (ref 3.5–5.2)
Sodium: 140 mmol/L (ref 134–144)

## 2016-04-21 LAB — CBC WITH DIFFERENTIAL/PLATELET
BASOS: 0 %
Basophils Absolute: 0 10*3/uL (ref 0.0–0.2)
EOS (ABSOLUTE): 0.2 10*3/uL (ref 0.0–0.4)
EOS: 2 %
HEMATOCRIT: 40.8 % (ref 34.0–46.6)
HEMOGLOBIN: 13.2 g/dL (ref 11.1–15.9)
IMMATURE GRANS (ABS): 0 10*3/uL (ref 0.0–0.1)
Immature Granulocytes: 0 %
LYMPHS: 34 %
Lymphocytes Absolute: 2.7 10*3/uL (ref 0.7–3.1)
MCH: 26.1 pg — AB (ref 26.6–33.0)
MCHC: 32.4 g/dL (ref 31.5–35.7)
MCV: 81 fL (ref 79–97)
MONOCYTES: 7 %
Monocytes Absolute: 0.5 10*3/uL (ref 0.1–0.9)
Neutrophils Absolute: 4.4 10*3/uL (ref 1.4–7.0)
Neutrophils: 57 %
Platelets: 242 10*3/uL (ref 150–379)
RBC: 5.06 x10E6/uL (ref 3.77–5.28)
RDW: 16.8 % — ABNORMAL HIGH (ref 12.3–15.4)
WBC: 7.8 10*3/uL (ref 3.4–10.8)

## 2016-04-30 ENCOUNTER — Ambulatory Visit (INDEPENDENT_AMBULATORY_CARE_PROVIDER_SITE_OTHER): Payer: Medicare Other | Admitting: *Deleted

## 2016-04-30 DIAGNOSIS — R55 Syncope and collapse: Secondary | ICD-10-CM | POA: Diagnosis not present

## 2016-05-01 NOTE — Progress Notes (Signed)
[facility] Summary Report / Loop Recorder 

## 2016-05-03 ENCOUNTER — Encounter: Payer: Self-pay | Admitting: *Deleted

## 2016-05-12 LAB — CUP PACEART REMOTE DEVICE CHECK
Date Time Interrogation Session: 20180105053946
MDC IDC PG IMPLANT DT: 20150917

## 2016-05-12 NOTE — Progress Notes (Signed)
Carelink summary report received. Battery status OK. Normal device function. No new symptom episodes, tachy episodes, brady, or pause episodes. No new AF episodes. Monthly summary reports and ROV/PRN 

## 2016-05-17 ENCOUNTER — Other Ambulatory Visit: Payer: Self-pay | Admitting: Family Medicine

## 2016-05-17 DIAGNOSIS — E039 Hypothyroidism, unspecified: Secondary | ICD-10-CM

## 2016-05-26 LAB — CUP PACEART REMOTE DEVICE CHECK
Date Time Interrogation Session: 20180204054256
MDC IDC PG IMPLANT DT: 20150917

## 2016-05-26 NOTE — Progress Notes (Signed)
Carelink summary report received. Battery status OK. Normal device function. No new symptom episodes, tachy episodes, brady, or pause episodes. No new AF episodes. Monthly summary reports and ROV/PRN 

## 2016-05-29 ENCOUNTER — Ambulatory Visit (INDEPENDENT_AMBULATORY_CARE_PROVIDER_SITE_OTHER): Payer: Medicare Other | Admitting: *Deleted

## 2016-05-29 DIAGNOSIS — R55 Syncope and collapse: Secondary | ICD-10-CM

## 2016-05-29 NOTE — Progress Notes (Signed)
[facility] Summary Report / Loop Recorder 

## 2016-06-04 ENCOUNTER — Other Ambulatory Visit: Payer: Self-pay | Admitting: Family

## 2016-06-15 ENCOUNTER — Other Ambulatory Visit: Payer: Self-pay | Admitting: Family Medicine

## 2016-06-20 ENCOUNTER — Inpatient Hospital Stay (HOSPITAL_COMMUNITY)
Admission: EM | Admit: 2016-06-20 | Discharge: 2016-06-24 | DRG: 092 | Disposition: A | Discharge status: Skilled Nursing Facility | Payer: Medicare Other | Attending: Internal Medicine | Admitting: Internal Medicine

## 2016-06-20 ENCOUNTER — Emergency Department (HOSPITAL_COMMUNITY): Payer: Medicare Other

## 2016-06-20 ENCOUNTER — Encounter (HOSPITAL_COMMUNITY): Payer: Self-pay

## 2016-06-20 ENCOUNTER — Ambulatory Visit (INDEPENDENT_AMBULATORY_CARE_PROVIDER_SITE_OTHER): Payer: Medicare Other | Admitting: Family Medicine

## 2016-06-20 VITALS — BP 138/81 | HR 75

## 2016-06-20 DIAGNOSIS — Z951 Presence of aortocoronary bypass graft: Secondary | ICD-10-CM | POA: Diagnosis not present

## 2016-06-20 DIAGNOSIS — K219 Gastro-esophageal reflux disease without esophagitis: Secondary | ICD-10-CM

## 2016-06-20 DIAGNOSIS — E039 Hypothyroidism, unspecified: Secondary | ICD-10-CM | POA: Diagnosis not present

## 2016-06-20 DIAGNOSIS — N183 Chronic kidney disease, stage 3 (moderate): Secondary | ICD-10-CM | POA: Diagnosis not present

## 2016-06-20 DIAGNOSIS — Z85828 Personal history of other malignant neoplasm of skin: Secondary | ICD-10-CM

## 2016-06-20 DIAGNOSIS — R4182 Altered mental status, unspecified: Secondary | ICD-10-CM | POA: Diagnosis not present

## 2016-06-20 DIAGNOSIS — E1129 Type 2 diabetes mellitus with other diabetic kidney complication: Secondary | ICD-10-CM | POA: Diagnosis not present

## 2016-06-20 DIAGNOSIS — Z888 Allergy status to other drugs, medicaments and biological substances status: Secondary | ICD-10-CM

## 2016-06-20 DIAGNOSIS — N390 Urinary tract infection, site not specified: Secondary | ICD-10-CM | POA: Diagnosis not present

## 2016-06-20 DIAGNOSIS — E785 Hyperlipidemia, unspecified: Secondary | ICD-10-CM | POA: Diagnosis not present

## 2016-06-20 DIAGNOSIS — G459 Transient cerebral ischemic attack, unspecified: Secondary | ICD-10-CM

## 2016-06-20 DIAGNOSIS — R4701 Aphasia: Secondary | ICD-10-CM | POA: Diagnosis not present

## 2016-06-20 DIAGNOSIS — Z7982 Long term (current) use of aspirin: Secondary | ICD-10-CM

## 2016-06-20 DIAGNOSIS — Z833 Family history of diabetes mellitus: Secondary | ICD-10-CM

## 2016-06-20 DIAGNOSIS — R0602 Shortness of breath: Secondary | ICD-10-CM | POA: Diagnosis present

## 2016-06-20 DIAGNOSIS — I129 Hypertensive chronic kidney disease with stage 1 through stage 4 chronic kidney disease, or unspecified chronic kidney disease: Secondary | ICD-10-CM | POA: Diagnosis present

## 2016-06-20 DIAGNOSIS — E1122 Type 2 diabetes mellitus with diabetic chronic kidney disease: Secondary | ICD-10-CM | POA: Diagnosis not present

## 2016-06-20 DIAGNOSIS — R350 Frequency of micturition: Secondary | ICD-10-CM | POA: Diagnosis present

## 2016-06-20 DIAGNOSIS — Z7984 Long term (current) use of oral hypoglycemic drugs: Secondary | ICD-10-CM

## 2016-06-20 DIAGNOSIS — Z8673 Personal history of transient ischemic attack (TIA), and cerebral infarction without residual deficits: Secondary | ICD-10-CM

## 2016-06-20 LAB — CBC
HCT: 39.1 % (ref 36.0–46.0)
Hemoglobin: 12.8 g/dL (ref 12.0–15.0)
MCH: 26.6 pg (ref 26.0–34.0)
MCHC: 32.7 g/dL (ref 30.0–36.0)
MCV: 81.3 fL (ref 78.0–100.0)
PLATELETS: 186 10*3/uL (ref 150–400)
RBC: 4.81 MIL/uL (ref 3.87–5.11)
RDW: 15.5 % (ref 11.5–15.5)
WBC: 7.4 10*3/uL (ref 4.0–10.5)

## 2016-06-20 LAB — URINALYSIS, ROUTINE W REFLEX MICROSCOPIC
Bilirubin Urine: NEGATIVE
Glucose, UA: 50 mg/dL — AB
Hgb urine dipstick: NEGATIVE
Ketones, ur: NEGATIVE mg/dL
Nitrite: POSITIVE — AB
Protein, ur: NEGATIVE mg/dL
Specific Gravity, Urine: 1.017 (ref 1.005–1.030)
pH: 5 (ref 5.0–8.0)

## 2016-06-20 LAB — COMPREHENSIVE METABOLIC PANEL
ALT: 12 U/L — AB (ref 14–54)
AST: 31 U/L (ref 15–41)
Albumin: 3.3 g/dL — ABNORMAL LOW (ref 3.5–5.0)
Alkaline Phosphatase: 57 U/L (ref 38–126)
Anion gap: 12 (ref 5–15)
BUN: 12 mg/dL (ref 6–20)
CHLORIDE: 104 mmol/L (ref 101–111)
CO2: 20 mmol/L — ABNORMAL LOW (ref 22–32)
CREATININE: 1.2 mg/dL — AB (ref 0.44–1.00)
Calcium: 8.5 mg/dL — ABNORMAL LOW (ref 8.9–10.3)
GFR, EST AFRICAN AMERICAN: 48 mL/min — AB (ref >60)
GFR, EST NON AFRICAN AMERICAN: 41 mL/min — AB (ref >60)
Glucose, Bld: 174 mg/dL — ABNORMAL HIGH (ref 65–99)
POTASSIUM: 3.6 mmol/L (ref 3.5–5.1)
Sodium: 136 mmol/L (ref 135–145)
TOTAL PROTEIN: 6.4 g/dL — AB (ref 6.5–8.1)
Total Bilirubin: 0.6 mg/dL (ref 0.3–1.2)

## 2016-06-20 LAB — TSH: TSH: 12.873 u[IU]/mL — ABNORMAL HIGH (ref 0.350–4.500)

## 2016-06-20 LAB — AMMONIA: AMMONIA: 18 umol/L (ref 9–35)

## 2016-06-20 LAB — I-STAT TROPONIN, ED: TROPONIN I, POC: 0 ng/mL (ref 0.00–0.08)

## 2016-06-20 LAB — GLUCOSE HEMOCUE WAIVED: Glu Hemocue Waived: 220 mg/dL — ABNORMAL HIGH (ref 65–99)

## 2016-06-20 LAB — T4, FREE: FREE T4: 1.14 ng/dL — AB (ref 0.61–1.12)

## 2016-06-20 MED ORDER — TRAMADOL HCL 50 MG PO TABS
50.0000 mg | ORAL_TABLET | Freq: Four times a day (QID) | ORAL | Status: DC | PRN
  Administered 2016-06-20: 50 mg via ORAL
  Filled 2016-06-20: qty 1

## 2016-06-20 MED ORDER — CEFTRIAXONE SODIUM 1 G IJ SOLR
1.0000 g | Freq: Once | INTRAMUSCULAR | Status: AC
  Administered 2016-06-20: 1 g via INTRAVENOUS
  Filled 2016-06-20: qty 10

## 2016-06-20 MED ORDER — ACETAMINOPHEN 325 MG PO TABS: 650.0000 mg | ORAL_TABLET | ORAL | Status: DC | PRN

## 2016-06-20 MED ORDER — ACETAMINOPHEN 160 MG/5ML PO SOLN: 650.0000 mg | ORAL | Status: DC | PRN

## 2016-06-20 MED ORDER — INSULIN ASPART 100 UNIT/ML ~~LOC~~ SOLN
0.0000 [IU] | Freq: Every day | SUBCUTANEOUS | Status: DC
  Administered 2016-06-21: 3 [IU] via SUBCUTANEOUS

## 2016-06-20 MED ORDER — ACETAMINOPHEN 650 MG RE SUPP: 650.0000 mg | RECTAL | Status: DC | PRN

## 2016-06-20 MED ORDER — LEVOTHYROXINE SODIUM 100 MCG PO TABS: 100.0000 ug | ORAL_TABLET | Freq: Every day | ORAL | Status: DC

## 2016-06-20 MED ORDER — ENOXAPARIN SODIUM 40 MG/0.4ML ~~LOC~~ SOLN
40.0000 mg | SUBCUTANEOUS | Status: DC
Start: 2016-06-20 — End: 2016-06-24
  Administered 2016-06-20 – 2016-06-23 (×4): 40 mg via SUBCUTANEOUS
  Filled 2016-06-20 (×4): qty 0.4

## 2016-06-20 MED ORDER — CEFTRIAXONE SODIUM 1 G IJ SOLR
1.0000 g | INTRAMUSCULAR | Status: DC
  Administered 2016-06-21 – 2016-06-22 (×2): 1 g via INTRAVENOUS
  Filled 2016-06-20 (×2): qty 10

## 2016-06-20 MED ORDER — SENNOSIDES-DOCUSATE SODIUM 8.6-50 MG PO TABS: 1.0000 | ORAL_TABLET | Freq: Every evening | ORAL | Status: DC | PRN

## 2016-06-20 MED ORDER — INSULIN ASPART 100 UNIT/ML ~~LOC~~ SOLN
0.0000 [IU] | Freq: Three times a day (TID) | SUBCUTANEOUS | Status: DC
  Administered 2016-06-21 (×2): 2 [IU] via SUBCUTANEOUS
  Administered 2016-06-21: 3 [IU] via SUBCUTANEOUS
  Administered 2016-06-22 (×3): 2 [IU] via SUBCUTANEOUS
  Administered 2016-06-23: 3 [IU] via SUBCUTANEOUS
  Administered 2016-06-23 – 2016-06-24 (×4): 2 [IU] via SUBCUTANEOUS

## 2016-06-20 MED ORDER — LORAZEPAM 1 MG PO TABS
0.5000 mg | ORAL_TABLET | Freq: Once | ORAL | Status: AC
  Administered 2016-06-20: 0.5 mg via ORAL
  Filled 2016-06-20: qty 1

## 2016-06-20 MED ORDER — PANTOPRAZOLE SODIUM 40 MG PO TBEC
40.0000 mg | DELAYED_RELEASE_TABLET | Freq: Every day | ORAL | Status: DC
  Administered 2016-06-21 – 2016-06-24 (×4): 40 mg via ORAL
  Filled 2016-06-20 (×4): qty 1

## 2016-06-20 MED ORDER — ASPIRIN EC 325 MG PO TBEC
325.0000 mg | DELAYED_RELEASE_TABLET | Freq: Every day | ORAL | Status: DC
  Administered 2016-06-20 – 2016-06-24 (×5): 325 mg via ORAL
  Filled 2016-06-20 (×5): qty 1

## 2016-06-20 MED ORDER — MORPHINE SULFATE (PF) 4 MG/ML IV SOLN: 2.0000 mg | Freq: Once | INTRAVENOUS | Status: DC | PRN

## 2016-06-20 MED ORDER — STROKE: EARLY STAGES OF RECOVERY BOOK
Freq: Once | Status: AC
  Administered 2016-06-20: 22:00:00
  Filled 2016-06-20: qty 1

## 2016-06-20 MED ORDER — TRAZODONE HCL 50 MG PO TABS
100.0000 mg | ORAL_TABLET | Freq: Every day | ORAL | Status: DC
  Administered 2016-06-20 – 2016-06-23 (×4): 100 mg via ORAL
  Filled 2016-06-20 (×4): qty 2

## 2016-06-20 MED ORDER — SODIUM CHLORIDE 0.9 % IV SOLN
INTRAVENOUS | Status: DC
  Administered 2016-06-20: 22:00:00 via INTRAVENOUS

## 2016-06-20 MED ORDER — IPRATROPIUM-ALBUTEROL 0.5-2.5 (3) MG/3ML IN SOLN: 3.0000 mL | RESPIRATORY_TRACT | Status: DC | PRN
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
## 2016-06-20 NOTE — ED Notes (Signed)
Pt transported to CT ?

## 2016-06-20 NOTE — Progress Notes (Signed)
Admitted via ED stretcher to [phone].  Patient A&O x4, c/o lower back pain. Oriented to room, safety precautions, meds & plan of care.  Daughter at bedside.

## 2016-06-20 NOTE — Progress Notes (Signed)
BP 138/81 (BP Location: Left Arm, Patient Position: Sitting, Cuff Size: Normal)   Pulse 75    Subjective:    Patient ID: Leah Levine, female    DOB: Feb 07, 1933, 83 y.o.   MRN: [id number]  HPI: Leah Levine is a 83 y.o. female presenting on 06/20/2016 for No chief complaint on file.   HPI Altered mental status Patient comes in today with altered mental status that started around 3:15 PM this afternoon. She was out at check her [place] with her 2 daughters and was eating normally and fine in talking normally and then all of a sudden she wasn't. The daughters are providing most of the history. They said that she was breathing differently and acting lightheaded and dizzy and wobbly on her feet and not moving or walking or talking normally. Sometimes intermittently she does answer questions correctly but then she doesn't at other times. They had pretty much carry her out of [place] and bring her over to [place] here at our office. She is a diabetic but she ate both breakfast and then they were eating lunch at that time. Her blood glucose here in the office today is 220. They deny her complaining of any urinary problems or burning or pain or urinary frequency more recently. She denied any abdominal pain. She said she was having some breathing difficulties in the few moments when she was able to answer questions and lucid. They did say that she had a previous stroke and has some effects on it on her right side of her face but they cannot tell if she has more than what she had previously. She has no wrinkle lines on her right forehead but that's from a previous accident when she was a lot younger. She is able to intermittently move her extremities on command but then sometimes she seems out of it to where she's not following commands any further at all. She does complain of dizziness and headache intermittently when she is lucid. We could not get her to answer if she's having chest pain or  shortness of breath currently. We could not get her to answer whether she is now or feeling weak on either side of her body currently.  Relevant past medical, surgical, family and social history reviewed and updated as indicated. Interim medical history since our last visit reviewed. Allergies and medications reviewed and updated.  Review of Systems  Constitutional: Negative for chills and fever.  Eyes: Negative for redness and visual disturbance.  Respiratory: Negative for chest tightness and shortness of breath.   Cardiovascular: Negative for chest pain and leg swelling.  Gastrointestinal: Negative for abdominal pain.  Genitourinary: Negative for difficulty urinating, dysuria and frequency.  Musculoskeletal: Negative for back pain and gait problem.  Skin: Negative for rash.  Neurological: Positive for dizziness, facial asymmetry, speech difficulty, weakness, light-headedness and headaches.  Psychiatric/Behavioral: Negative for agitation and behavioral problems.  All other systems reviewed and are negative.   Per HPI unless specifically indicated above        Objective:    BP 138/81 (BP Location: Left Arm, Patient Position: Sitting, Cuff Size: Normal)   Pulse 75   Wt Readings from Last 3 Encounters:  04/20/16 169 lb (76.7 kg)  03/15/16 175 lb (79.4 kg)  03/07/16 173 lb 8 oz (78.7 kg)    Physical Exam  Constitutional: She appears well-developed and well-nourished. No distress.  Eyes: Conjunctivae are normal.  Neck: Neck supple. No thyromegaly present.  Cardiovascular: Normal rate,  regular rhythm, normal heart sounds and intact distal pulses.   No murmur heard. Pulmonary/Chest: Effort normal and breath sounds normal. No respiratory distress. She has no wheezes. She has no rales.  Abdominal: Soft. Bowel sounds are normal. She exhibits no distension. There is no tenderness. There is no rebound and no guarding.  Musculoskeletal: Normal range of motion. She exhibits no edema or  tenderness.  Lymphadenopathy:    She has no cervical adenopathy.  Neurological: She is disoriented. A cranial nerve deficit (Right sided facial droop and lack of wrinkle lines on right forehead) is present. Sensory deficit: unable to assess. Abnormal muscle tone:  unable to fully assess. Coordination and gait abnormal.  Patient is only semi-responsive to questions and commands.  Skin: Skin is warm and dry. No rash noted. She is not diaphoretic.  Psychiatric: She has a normal mood and affect. Her behavior is normal.  Nursing note and vitals reviewed.  EKG: Normal sinus rhythm, on the verge of type I block.  Glucose: 220    Assessment & Plan:   Problem List Items Addressed This Visit    None    Visit Diagnoses    Altered mental status, unspecified altered mental status type    -  Primary   Relevant Orders   EKG 12-Lead (Completed)      Patient was sent via EMS to [facility].  Follow up plan: Return if symptoms worsen or fail to improve.  Counseling provided for all of the vaccine components Orders Placed This Encounter  Procedures  . EKG 12-Lead    Caryl Pina, MD [facility] 06/20/2016, 3:57 PM

## 2016-06-20 NOTE — ED Provider Notes (Signed)
[facility] DEPT Provider Note   CSN: [id number] Arrival date & time: 06/20/16  1651     History   Chief Complaint Chief Complaint  Patient presents with  . Shortness of Breath  . Altered Mental Status    HPI Leah Levine is a 83 y.o. female.  HPI   83 year old female with history of CAD status post CABG, prior CVA with no residual deficits, DM 2, hypothyroidism, HTN, who presents for altered mental status. History obtained from the medical record (pt was seen by her PCP prior to arrival) as well as from EMS.  They state that the pt's daughters were out with her having lunch when the patient began hyperventilating, stating that she was lightheaded, and appeared "wobbly" on her feet. The pt apparently completely stopped speaking and became very anxious. They denied focal weakness or facial droop. Patient was brought to her primary care physician, and then was referred here for further workup.  On arrival, patient initially cannot answer questions, but after some time begins using 1-2 word phrases, and after about 1 hour full sentences. She is alert and oriented x4 and denies pain, fevers, nausea, vomiting, shortness of breath, or dysuria. Per her medical record she is on aspirin, but she states she is not taking this.    Past Medical History:  Diagnosis Date  . Allergy   . Anemia   . CAD (coronary artery disease) 11-2012   CABG x 4 utilizing LIMA to LAD, SVG to Diagonal, SVG to Left Circumflex, and SVG to RCA  . Diabetes mellitus type 2, controlled ([place])   . GERD (gastroesophageal reflux disease)   . Hyperlipidemia   . Hypertension    patient denies ever having hypertension  . Hypothyroidism   . Personal history of colonic polyps 11/20/2010   tubular adenomas  . Skin cancer    ear, right side of cheek  . Stroke ([place])   . Tremor     Patient Active Problem List   Diagnosis Date Noted  . TIA (transient ischemic attack) 06/20/2016  . Chest pain 03/06/2016  .  Arm pain, anterior, left 03/06/2016  . Orthostatic hypotension 02/21/2016  . Late effect of cerebrovascular accident 11/10/2015  . Foraminal stenosis of cervical region 11/10/2015  . Frequent falls 11/10/2015  . Hypertriglyceridemia 09/05/2015  . Neck muscle spasm 08/26/2015  . CKD stage 3 due to type 2 diabetes mellitus ([place]) 08/26/2015  . Intertrigo 03/14/2015  . Stroke (cerebrum) ([place]) 03/05/2015  . Expressive aphasia 03/05/2015  . Syncope 12/09/2013  . Coronary artery disease 09/18/2013  . Hypothyroidism 06/18/2013  . S/P CABG x 4 12/01/2012  . Vitamin B12 deficiency 12/05/2010  . Personal history of colonic polyps 12/05/2010  . Esophageal dysphagia 11/20/2010  . Benign neoplasm of colon 11/20/2010  . Gastritis, chronic 11/20/2010  . Allergic rhinitis 07/03/2010  . Tremor   . GERD (gastroesophageal reflux disease)   . Hyperlipidemia   . Hypertension   . Urinary incontinence   . Type 2 diabetes mellitus with renal manifestations [facility])     Past Surgical History:  Procedure Laterality Date  . ABDOMINAL HYSTERECTOMY    . APPENDECTOMY    . BACK SURGERY    . CARPAL TUNNEL RELEASE    . CHOLECYSTECTOMY    . CORONARY ARTERY BYPASS GRAFT N/A 11/26/2012   Procedure: CORONARY ARTERY BYPASS GRAFTING (CABG);  Surgeon: Grace Isaac, MD;  Location: [facility];  Service: Open Heart Surgery;  Laterality: N/A;  . INTRAOPERATIVE TRANSESOPHAGEAL ECHOCARDIOGRAM N/A 11/26/2012  Procedure: INTRAOPERATIVE TRANSESOPHAGEAL ECHOCARDIOGRAM;  Surgeon: Grace Isaac, MD;  Location: [place];  Service: Open Heart Surgery;  Laterality: N/A;  . KNEE ARTHROSCOPY     bilateral  . LEFT HEART CATHETERIZATION WITH CORONARY ANGIOGRAM N/A 11/25/2012   Procedure: LEFT HEART CATHETERIZATION WITH CORONARY ANGIOGRAM;  Surgeon: Larey Dresser, MD;  Location: [facility] CATH LAB;  Service: Cardiovascular;  Laterality: N/A;  . LOOP RECORDER IMPLANT N/A 12/10/2013   Procedure: LOOP RECORDER IMPLANT;  Surgeon: Coralyn Mark,  MD;  Location: [facility] CATH LAB;  Service: Cardiovascular;  Laterality: N/A;  . NECK SURGERY      OB History    No data available       Home Medications    Prior to Admission medications   Medication Sig Start Date End Date Taking? Authorizing Provider  aspirin EC 325 MG EC tablet Take 1 tablet (325 mg total) by mouth daily. 12/11/13   Nita Sells, MD  clindamycin (CLEOCIN) 300 MG capsule Take 1 capsule (300 mg total) by mouth 3 (three) times daily. 04/20/16   Timmothy Euler, MD  levothyroxine (SYNTHROID, LEVOTHROID) 100 MCG tablet TAKE 1 TABLET ONCE A DAY 05/17/16   Timmothy Euler, MD  metFORMIN (GLUCOPHAGE) 1000 MG tablet TAKE  (1)  TABLET TWICE A DAY WITH MEALS (BREAKFAST AND SUPPER) 06/18/16   Timmothy Euler, MD  omeprazole (PRILOSEC) 20 MG capsule TAKE 1 CAPSULE (20 MG TOTAL) BY MOUTH DAILY. 03/30/15   Timmothy Euler, MD  propranolol (INDERAL) 60 MG tablet TAKE (1) TABLET TWICE A DAY. 05/17/16   Timmothy Euler, MD  traMADol (ULTRAM) 50 MG tablet Take 50 mg by mouth every 6 (six) hours as needed for moderate pain or severe pain.    Historical Provider, MD  traZODone (DESYREL) 100 MG tablet Take 1-2 tablets (100-200 mg total) by mouth at bedtime. 02/21/16   Timmothy Euler, MD    Family History Family History  Problem Relation Age of Onset  . Ovarian cancer Mother   . Diabetes Father   . Pneumonia Father   . Diabetes Sister   . Stroke Daughter   . Heart attack Sister   . Heart attack Brother     Social History Social History  Substance Use Topics  . Smoking status: Never Smoker  . Smokeless tobacco: Never Used  . Alcohol use No     Allergies   Celebrex [celecoxib]; Fenofibrate; and Statins   Review of Systems Review of Systems  Constitutional: Negative for chills and fever.  HENT: Negative for congestion.   Eyes: Negative for visual disturbance.  Respiratory: Negative for cough and shortness of breath.   Cardiovascular: Negative for chest pain  and palpitations.  Gastrointestinal: Negative for abdominal pain, nausea and vomiting.  Genitourinary: Negative for dysuria and frequency.  Musculoskeletal: Negative for arthralgias, back pain, myalgias, neck pain and neck stiffness.  Skin: Negative for color change.  Neurological: Positive for speech difficulty. Negative for dizziness, weakness, light-headedness, numbness and headaches.  Psychiatric/Behavioral: Negative for agitation.     Physical Exam Updated Vital Signs BP (!) 152/72 (BP Location: Right Arm)   Pulse 82   Temp 98.9 F (37.2 C) (Oral)   Resp 18   Ht 5' (1.524 m)   Wt 80.2 kg   SpO2 98%   BMI 34.55 kg/m   Physical Exam  Constitutional: She appears well-developed and well-nourished. No distress.  HENT:  Head: Normocephalic and atraumatic.  Mouth/Throat: Oropharynx is clear and moist.  Eyes: Conjunctivae  and EOM are normal. Pupils are equal, round, and reactive to light.  Neck: Normal range of motion. Neck supple.  Cardiovascular: Normal rate, regular rhythm, normal heart sounds and intact distal pulses.  Exam reveals no gallop and no friction rub.   No murmur heard. Pulmonary/Chest: Effort normal and breath sounds normal. No respiratory distress. She has no wheezes. She has no rales.  Abdominal: Soft. Bowel sounds are normal. She exhibits no distension. There is tenderness (mild, suprapubic). There is no guarding.  Musculoskeletal: Normal range of motion. She exhibits no edema or tenderness.  Neurological: No cranial nerve deficit.  Initially non-verbal, then speaks in 1-2 word phrases, then on re-assessment with normal speech. 5/5 strength in the upper and lower extremities with intact sensation to light touch on arrival and re-assessment.   Skin: Skin is warm and dry.  Nursing note and vitals reviewed.    ED Treatments / Results  Labs (all labs ordered are listed, but only abnormal results are displayed) Labs Reviewed  URINALYSIS, ROUTINE W REFLEX  MICROSCOPIC - Abnormal; Notable for the following:       Result Value   Glucose, UA 50 (*)    Nitrite POSITIVE (*)    Leukocytes, UA MODERATE (*)    Bacteria, UA MANY (*)    Squamous Epithelial / LPF 0-5 (*)    Non Squamous Epithelial 0-5 (*)    All other components within normal limits  TSH - Abnormal; Notable for the following:    TSH 12.873 (*)    All other components within normal limits  T4, FREE - Abnormal; Notable for the following:    Free T4 1.14 (*)    All other components within normal limits  COMPREHENSIVE METABOLIC PANEL - Abnormal; Notable for the following:    CO2 20 (*)    Glucose, Bld 174 (*)    Creatinine, Ser 1.20 (*)    Calcium 8.5 (*)    Total Protein 6.4 (*)    Albumin 3.3 (*)    ALT 12 (*)    GFR calc non Af Amer 41 (*)    GFR calc Af Amer 48 (*)    All other components within normal limits  URINE CULTURE  CBC  AMMONIA  HEMOGLOBIN A1C  LIPID PANEL  GLUCOSE, CAPILLARY  I-STAT TROPOININ, ED  I-STAT TROPOININ, ED  I-STAT TROPOININ, ED    EKG  EKG Interpretation None       Radiology Dg Chest 2 View  Result Date: 06/20/2016 CLINICAL DATA:  Short of breath chest pain EXAM: CHEST  2 VIEW COMPARISON:  03/05/2016 FINDINGS: Postop CABG and cardiac loop recorder. Negative for heart failure. Heart size within normal limits. Lungs are clear without infiltrate effusion or mass. Calcified granuloma in the lingula is unchanged. IMPRESSION: No active cardiopulmonary disease. Electronically Signed   By: Franchot Gallo M.D.   On: 06/20/2016 18:03   Ct Head Wo Contrast  Result Date: 06/20/2016 CLINICAL DATA:  Confusion, shortness of breath. History of hypertension, diabetes. EXAM: CT HEAD WITHOUT CONTRAST TECHNIQUE: Contiguous axial images were obtained from the base of the skull through the vertex without intravenous contrast. COMPARISON:  CT HEAD January 28, 2016 FINDINGS: BRAIN: No intraparenchymal hemorrhage, mass effect nor midline shift. The ventricles and  sulci are normal for age. Old bilateral basal ganglia/LEFT internal capsule and LEFT thalamus lacunar infarcts. Patchy supratentorial white matter hypodensities within normal range for patient's age, though non-specific are most compatible with chronic small vessel ischemic disease. No acute large vascular  territory infarcts. No abnormal extra-axial fluid collections. Basal cisterns are patent. VASCULAR: Moderate calcific atherosclerosis of the carotid siphons. SKULL: No skull fracture. No significant scalp soft tissue swelling. SINUSES/ORBITS: Trace paranasal sinus mucosal thickening, RIGHT concha bullosa. Mastoid air cells are well aerated. The included ocular globes and orbital contents are non-suspicious. Bilateral ocular lens implants. OTHER: Fatty replaced parotid glands. IMPRESSION: No acute intracranial process. Stable examination including moderate chronic small vessel ischemic disease and multiple lacunar infarcts. Electronically Signed   By: Elon Alas M.D.   On: 06/20/2016 18:43    Procedures Procedures (including critical care time)  Medications Ordered in ED Medications  morphine 4 MG/ML injection 2 mg (not administered)  levothyroxine (SYNTHROID, LEVOTHROID) tablet 100 mcg (not administered)  traZODone (DESYREL) tablet 100-200 mg (100 mg Oral Given 06/20/16 2238)  traMADol (ULTRAM) tablet 50 mg (50 mg Oral Given 06/20/16 2238)  pantoprazole (PROTONIX) EC tablet 40 mg (not administered)  aspirin EC tablet 325 mg (325 mg Oral Given 06/20/16 2238)  0.9 %  sodium chloride infusion ( Intravenous New Bag/Given 06/20/16 2222)  acetaminophen (TYLENOL) tablet 650 mg (not administered)    Or  acetaminophen (TYLENOL) solution 650 mg (not administered)    Or  acetaminophen (TYLENOL) suppository 650 mg (not administered)  senna-docusate (Senokot-S) tablet 1 tablet (not administered)  enoxaparin (LOVENOX) injection 40 mg (40 mg Subcutaneous Given 06/20/16 2239)  ipratropium-albuterol  (DUONEB) 0.5-2.5 (3) MG/3ML nebulizer solution 3 mL (not administered)  insulin aspart (novoLOG) injection 0-9 Units (not administered)  insulin aspart (novoLOG) injection 0-5 Units (0 Units Subcutaneous Not Given 06/20/16 2240)  cefTRIAXone (ROCEPHIN) 1 g in dextrose 5 % 50 mL IVPB (not administered)  LORazepam (ATIVAN) tablet 0.5 mg (0.5 mg Oral Given 06/20/16 1739)  cefTRIAXone (ROCEPHIN) 1 g in dextrose 5 % 50 mL IVPB (0 g Intravenous Stopped 06/20/16 2139)   stroke: mapping our early stages of recovery book ( Does not apply Given 06/20/16 2222)     Initial Impression / Assessment and Plan / ED Course  I have reviewed the triage vital signs and the nursing notes.  Pertinent labs & imaging results that were available during my care of the patient were reviewed by me and considered in my medical decision making (see chart for details).    Afebrile and hemodynamically stable. Patient is aphasic on arrival, then begins to say 1-2 word phrases, and after about an hour exhibits normal speech. Initially she appears very anxious, but this resolved with normalization of her speech at 0.5 mg of oral ativan. No focal weakness or sensory deficits as above. Patient endorses a history of UTIs and has some mild suprapubic tenderness. UA with moderate leukocytes, too numerous to count white blood cells, and many bacteria, so patient given a dose of Rocephin for empiric treatment of possible UTI.  Transient nature of the patient's aphasia is consistent with TIA. CT head obtained with chronic microvascular disease and no acute intracranial abnormalities. Neurology consultated and will be evaluating the patient. Patient is admitted to medicine for TIA workup. MRI ordered.  Patient has an elevated TSH and free T4 but is on Synthroid. Doubt thyrotoxicosis as the cause of her symptoms. EKG with poor R-wave progression. Troponin is 0.00 and patient denies chest pain, doubt cardiac ischemia.  Patient admitted to  medicine in stable condition.  Care of pt overseen by my attending, Dr. Thomasene Lot.   Final Clinical Impressions(s) / ED Diagnoses   Final diagnoses:  Aphasia    New Prescriptions Current Discharge  Medication List       Zipporah Plants, MD 06/21/16 0142    Macarthur Critchley, MD 06/23/16 [phone]
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
## 2016-06-20 NOTE — Progress Notes (Signed)
Pharmacy Antibiotic Consult Note  Leah Levine is a 83 y.o. female admitted on 06/20/2016 with UTI.  Pharmacy has been consulted for Rocephin dosing.  Plan: Rocephin 1g IV Q24H.  Pharmacy will sign off.  Height: 5' (152.4 cm) Weight: 176 lb 14.4 oz (80.2 kg) IBW/kg (Calculated) : 45.5  Temp (24hrs), Avg:98.9 F (37.2 C), Min:98.9 F (37.2 C), Max:98.9 F (37.2 C)   Recent Labs Lab 06/20/16 1827  WBC 7.4  CREATININE 1.20*    Estimated Creatinine Clearance: 34.5 mL/min (A) (by C-G formula based on SCr of 1.2 mg/dL (H)).    Allergies  Allergen Reactions  . Celebrex [Celecoxib] Other (See Comments)    Muscle aches and leg pains  . Fenofibrate Other (See Comments)    Muscle aches and leg pains  . Statins Other (See Comments)    Myalgias; muscle aches and leg pains     Thank you for allowing pharmacy to be a part of this patient's care.  Wynona Neat, PharmD, BCPS  06/20/2016 11:13 PM

## 2016-06-20 NOTE — ED Triage Notes (Addendum)
Patient arrived from MD office in [place]. EMS reports that family states they were eating lunch and following lunch wasn't acting right and shortness of breath. On arrival hyperventilating but denies pain. Grips equal, no facial droop, no chest pain. Alert but will not answer demographic personal questions, IV infusing on arrival. Appears anxious and states no when asked regarding hx of anxiety. Follows commands

## 2016-06-20 NOTE — Addendum Note (Signed)
Addended by: Marin Olp on: 06/20/2016 04:50 PM   Modules accepted: Orders

## 2016-06-20 NOTE — ED Notes (Signed)
Attempted report x1. 

## 2016-06-21 ENCOUNTER — Observation Stay (HOSPITAL_COMMUNITY): Payer: Medicare Other

## 2016-06-21 ENCOUNTER — Observation Stay (HOSPITAL_BASED_OUTPATIENT_CLINIC_OR_DEPARTMENT_OTHER): Payer: Medicare Other

## 2016-06-21 DIAGNOSIS — Z8673 Personal history of transient ischemic attack (TIA), and cerebral infarction without residual deficits: Secondary | ICD-10-CM | POA: Diagnosis not present

## 2016-06-21 DIAGNOSIS — E7801 Familial hypercholesterolemia: Secondary | ICD-10-CM | POA: Diagnosis not present

## 2016-06-21 DIAGNOSIS — Z85828 Personal history of other malignant neoplasm of skin: Secondary | ICD-10-CM | POA: Diagnosis not present

## 2016-06-21 DIAGNOSIS — Z7984 Long term (current) use of oral hypoglycemic drugs: Secondary | ICD-10-CM | POA: Diagnosis not present

## 2016-06-21 DIAGNOSIS — E039 Hypothyroidism, unspecified: Secondary | ICD-10-CM | POA: Diagnosis present

## 2016-06-21 DIAGNOSIS — I639 Cerebral infarction, unspecified: Secondary | ICD-10-CM | POA: Diagnosis not present

## 2016-06-21 DIAGNOSIS — Z888 Allergy status to other drugs, medicaments and biological substances status: Secondary | ICD-10-CM | POA: Diagnosis not present

## 2016-06-21 DIAGNOSIS — K219 Gastro-esophageal reflux disease without esophagitis: Secondary | ICD-10-CM | POA: Diagnosis present

## 2016-06-21 DIAGNOSIS — Z951 Presence of aortocoronary bypass graft: Secondary | ICD-10-CM | POA: Diagnosis not present

## 2016-06-21 DIAGNOSIS — N183 Chronic kidney disease, stage 3 (moderate): Secondary | ICD-10-CM | POA: Diagnosis not present

## 2016-06-21 DIAGNOSIS — R350 Frequency of micturition: Secondary | ICD-10-CM | POA: Diagnosis present

## 2016-06-21 DIAGNOSIS — K210 Gastro-esophageal reflux disease with esophagitis: Secondary | ICD-10-CM

## 2016-06-21 DIAGNOSIS — Z833 Family history of diabetes mellitus: Secondary | ICD-10-CM | POA: Diagnosis not present

## 2016-06-21 DIAGNOSIS — G459 Transient cerebral ischemic attack, unspecified: Secondary | ICD-10-CM

## 2016-06-21 DIAGNOSIS — E7800 Pure hypercholesterolemia, unspecified: Secondary | ICD-10-CM | POA: Diagnosis not present

## 2016-06-21 DIAGNOSIS — Z7982 Long term (current) use of aspirin: Secondary | ICD-10-CM | POA: Diagnosis not present

## 2016-06-21 DIAGNOSIS — N390 Urinary tract infection, site not specified: Secondary | ICD-10-CM | POA: Diagnosis present

## 2016-06-21 DIAGNOSIS — E1122 Type 2 diabetes mellitus with diabetic chronic kidney disease: Secondary | ICD-10-CM | POA: Diagnosis not present

## 2016-06-21 DIAGNOSIS — E785 Hyperlipidemia, unspecified: Secondary | ICD-10-CM | POA: Diagnosis present

## 2016-06-21 DIAGNOSIS — R0602 Shortness of breath: Secondary | ICD-10-CM | POA: Diagnosis present

## 2016-06-21 DIAGNOSIS — I129 Hypertensive chronic kidney disease with stage 1 through stage 4 chronic kidney disease, or unspecified chronic kidney disease: Secondary | ICD-10-CM | POA: Diagnosis present

## 2016-06-21 DIAGNOSIS — R4701 Aphasia: Secondary | ICD-10-CM | POA: Diagnosis not present

## 2016-06-21 LAB — GLUCOSE, CAPILLARY
Glucose-Capillary: 180 mg/dL — ABNORMAL HIGH (ref 65–99)
Glucose-Capillary: 170 mg/dL — ABNORMAL HIGH (ref 65–99)
Glucose-Capillary: 212 mg/dL — ABNORMAL HIGH (ref 65–99)
Glucose-Capillary: 163 mg/dL — ABNORMAL HIGH (ref 65–99)
Glucose-Capillary: 227 mg/dL — ABNORMAL HIGH (ref 65–99)

## 2016-06-21 LAB — LIPID PANEL
Cholesterol: 192 mg/dL (ref 0–200)
HDL: 27 mg/dL — ABNORMAL LOW (ref >40)
LDL CALC: UNDETERMINED mg/dL (ref 0–99)
Total CHOL/HDL Ratio: 7.1 RATIO
Triglycerides: 408 mg/dL — ABNORMAL HIGH (ref <150)
VLDL: UNDETERMINED mg/dL (ref 0–40)

## 2016-06-21 LAB — ECHOCARDIOGRAM COMPLETE
HEIGHTINCHES: 60 in
WEIGHTICAEL: 2830.4 [oz_av]

## 2016-06-21 LAB — POCT I-STAT TROPONIN I: TROPONIN I, POC: 0 ng/mL (ref 0.00–0.08)

## 2016-06-21 MED ORDER — ROSUVASTATIN CALCIUM 20 MG PO TABS
20.0000 mg | ORAL_TABLET | Freq: Every day | ORAL | Status: DC
  Administered 2016-06-21 – 2016-06-24 (×4): 20 mg via ORAL
  Filled 2016-06-21 (×4): qty 1

## 2016-06-21 MED ORDER — LEVOTHYROXINE SODIUM 112 MCG PO TABS
112.0000 ug | ORAL_TABLET | Freq: Every day | ORAL | Status: DC
  Administered 2016-06-22 – 2016-06-24 (×3): 112 ug via ORAL
  Filled 2016-06-21 (×3): qty 1
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
## 2016-06-21 NOTE — Care Management Note (Signed)
Case Management Note  Patient Details  Name: Leah Levine MRN: [id number] Date of Birth: 1932-06-02  Subjective/Objective:  Pt in with expressive aphasia. She is from home with family.                   Action/Plan: Awaiting PT/OT recommendations. CM following for d/c needs, physician orders.   Expected Discharge Date:                  Expected Discharge Plan:     In-House Referral:     Discharge planning Services     Post Acute Care Choice:    Choice offered to:     DME Arranged:    DME Agency:     HH Arranged:    HH Agency:     Status of Service:  In process, will continue to follow  If discussed at Long Length of Stay Meetings, dates discussed:    Additional Comments:  Pollie Friar, RN 06/21/2016, 11:44 AM

## 2016-06-21 NOTE — Progress Notes (Signed)
Patient is off the floor to MRI.

## 2016-06-21 NOTE — Progress Notes (Signed)
  Echocardiogram 2D Echocardiogram has been performed.  Leah Levine 06/21/2016, 1:36 PM

## 2016-06-21 NOTE — Evaluation (Signed)
Occupational Therapy Evaluation Patient Details Name: Leah Levine MRN: [id number] DOB: 30-May-1932 Today's Date: 06/21/2016    History of Present Illness Pt is an 83 y/o female with a PMH significant for HTN, HLD, CAD s/p CABG, CKD stage III, and CVA with residual R sided weakness; who presented to ED on 06/20/16 after having inability to speak and acted like she was dizzy/lightheaded. At baseline patient needs assistance to walk, but family members had to carry her out of the restaurant. Associated symptoms included inability to lift her arms intermittent, shortness of breath, urinary frequency, and seem to be in a fog. MRI revealed No acute intracranial abnormality.   Clinical Impression   Pt admitted with above diagnosis. Pt currently with functional limitations due to the deficits listed below (see OT Problem List). Pt demonstrating decreased safety awareness. Daughter present in room and reports that the patient has required substantial assistance at home for ADL and functional mobility. Daughter reports that she does not feel HHOT has been helpful in the past, and declines HHOT to return after this admission. Feel SNF may be a good option to maximize safety and independence prior to return home, however it is likely that family and Pt will decline this. Pt will benefit from skilled OT to increase her independence and safety with ADL and functional transfers to allow discharge to the venue listed below.    Follow Up Recommendations  SNF;Supervision/Assistance - 24 hour ((Pt will likely refuse SNF placement))    Equipment Recommendations  Tub/shower seat    Recommendations for Other Services       Precautions / Restrictions Precautions Precautions: Fall Restrictions Weight Bearing Restrictions: No      Mobility Bed Mobility Overal bed mobility: Needs Assistance Bed Mobility: Rolling;Sidelying to Sit Rolling: Min assist Sidelying to sit: Mod assist       General bed  mobility comments: Assist for transition to EOB including advancing LE's off edge and elevating shoulders to full sitting position. VC's for sequencing required.  Transfers Overall transfer level: Needs assistance Equipment used: Rolling walker (2 wheeled) Transfers: Sit to/from Stand Sit to Stand: Min assist         General transfer comment: Pt able to power-up to full standing position with assistance for balance support. Pt appears dizzy when she stands and closes her eyes/wants to cover her eyes.     Balance Overall balance assessment: Needs assistance Sitting-balance support: Feet supported;No upper extremity supported Sitting balance-Leahy Scale: Fair Sitting balance - Comments: sitting EOB with no back support   Standing balance support: Bilateral upper extremity supported;During functional activity Standing balance-Leahy Scale: Poor Standing balance comment: Required UE support for dynamic activity                           ADL either performed or assessed with clinical judgement   ADL Overall ADL's : At baseline                                       General ADL Comments: Daughter reports that Pt is at baseline, Pt in agreement.      Vision Baseline Vision/History: Wears glasses Wears Glasses: Reading only Patient Visual Report: No change from baseline Additional Comments: Visual assessment attempted, Pt struggled with dizziness saying that she felt like the room was spinning during assessment, and so OT stopped the visual assessment  Perception     Praxis      Pertinent Vitals/Pain Pain Assessment: 0-10 Pain Score: 9  Pain Location: chronic back pain Pain Descriptors / Indicators: Constant Pain Intervention(s): Monitored during session     Hand Dominance Right   Extremity/Trunk Assessment Upper Extremity Assessment Upper Extremity Assessment: Overall WFL for tasks assessed   Lower Extremity Assessment Lower Extremity  Assessment: Defer to PT evaluation   Cervical / Trunk Assessment Cervical / Trunk Assessment: Other exceptions Cervical / Trunk Exceptions: Forward head/rounded shoulder posture   Communication Communication Communication: No difficulties   Cognition Arousal/Alertness: Awake/alert Behavior During Therapy: Flat affect;Impulsive Overall Cognitive Status: History of cognitive impairments - at baseline Area of Impairment: Orientation;Attention;Memory;Following commands;Safety/judgement;Awareness;Problem solving                 Orientation Level: Disoriented to Current Attention Level: Sustained Memory: Decreased short-term memory;Decreased recall of precautions Following Commands: Follows one step commands consistently;Follows one step commands with increased time Safety/Judgement: Decreased awareness of safety;Decreased awareness of deficits Awareness: Emergent Problem Solving: Difficulty sequencing;Requires verbal cues;Requires tactile cues     General Comments  Pt's daughter in room for session, she reports that [facility] therapy has been out many times before and "they don't make a difference"    Exercises     Shoulder Instructions      Home Living Family/patient expects to be discharged to:: Private residence Living Arrangements: Children Available Help at Discharge: Family;Available 24 hours/day Type of Home: House Home Access: Stairs to enter [employer] of Steps: 2 Entrance Stairs-Rails: Right;Left;Can reach both Home Layout: One level     Bathroom Shower/Tub: Teacher, early years/pre: Standard Bathroom Accessibility: Yes How Accessible: Accessible via walker Home Equipment: [place] - 4 wheels;Bedside commode;Shower seat;Wheelchair - manual;Grab bars - tub/shower          Prior Functioning/Environment Level of Independence: Needs assistance  Gait / Transfers Assistance Needed: rollator for mobility with family supervision ADL's / Homemaking  Assistance Needed: assist for bathing, dressing, tub transfers            OT Problem List: Decreased activity tolerance;Impaired balance (sitting and/or standing);Decreased safety awareness;Decreased knowledge of use of DME or AE      OT Treatment/Interventions: Self-care/ADL training;Energy conservation;DME and/or AE instruction;Therapeutic activities;Patient/family education;Balance training    OT Goals(Current goals can be found in the care plan section) Acute Rehab OT Goals Patient Stated Goal: Pt didnt state goals ADL Goals Pt Will Perform Grooming: with min guard assist;standing;with caregiver independent in assisting Pt Will Perform Upper Body Bathing: with mod assist;sitting;with caregiver independent in assisting Pt Will Perform Lower Body Bathing: with mod assist;with caregiver independent in assisting;sit to/from stand Pt Will Transfer to Toilet: with min guard assist;ambulating (comfort height toilet; with RW) Pt Will Perform Toileting - Clothing Manipulation and hygiene: with min guard assist;with caregiver independent in assisting;sit to/from stand  OT Frequency: Min 2X/week   Barriers to D/C:            Co-evaluation PT/OT/SLP Co-Evaluation/Treatment: Yes Reason for Co-Treatment: To address functional/ADL transfers;For patient/therapist safety PT goals addressed during session: Mobility/safety with mobility;Balance;Proper use of DME OT goals addressed during session: ADL's and self-care      End of Session Equipment Utilized During Treatment: Gait belt;Rolling walker Nurse Communication: Mobility status (frequency/urgency with urination; no chair alarm)  Activity Tolerance: Patient tolerated treatment well Patient left: in chair;with call bell/phone within reach;with family/visitor present  OT Visit Diagnosis: Unsteadiness on feet (R26.81);History of falling (Z91.81);Dizziness and  giddiness (R42)                Time: 9447-3958 OT Time Calculation (min): 50  min Charges:  OT General Charges $OT Visit: 1 Procedure OT Evaluation $OT Eval Moderate Complexity: 1 Procedure G-Codes: OT G-codes **NOT FOR INPATIENT CLASS** Functional Assessment Tool Used: AM-PAC 6 Clicks Daily Activity Functional Limitation: Self care Self Care Current Status (G4171): At least 40 percent but less than 60 percent impaired, limited or restricted Self Care Goal Status (W7871): At least 20 percent but less than 40 percent impaired, limited or restricted   Hulda Humphrey OTR/L [place] 06/21/2016, 4:32 PM

## 2016-06-21 NOTE — Progress Notes (Signed)
[facility] PROGRESS NOTE  Leah Levine PXT:[id number] DOB: 16-Sep-1932 DOA: 06/20/2016   PCP: Kenn File, MD     Assessment/Plan: Principal Problem:   Expressive aphasia Active Problems:   GERD (gastroesophageal reflux disease)   Hyperlipidemia   Type 2 diabetes mellitus with renal manifestations (HCC)   S/P CABG x 4   Hypothyroidism   CKD stage 3 due to type 2 diabetes mellitus ([place])   TIA (transient ischemic attack)    83 y.o. female with medical history significant of HTN, HLD, CAD s/p CABG, hypothyroidism, CKD stage III, and CVA; who presents after having inability to speak. History is obtained from the patient daughter who is present at bedside as well as the patient. The patient and her 2 daughters had just finished eating lunch around 2:45- 3:15PM when patient complained of feeling dizzy/lightheaded with a funny sensation in her head and face.Initial workup revealed urinalysis suggestive of infection, TSH 12.873, FT4 1.14, and all other labs are relatively within normal limits. CT scan of the brain showed no acute abnormalities. Patient admitted for stroke workup  Assessment/Plan Expressive aphasia 2/2  suspected TIA vs. CVA: Acute. Patient reports acute onset of inability to speak in generalized weakness.  Telemetry shows normal sinus rhythm MRI brain  No acute intracranial abnormality.Confusion in unsteadiness in the setting of urinary tract infection and very likely dementia - Stroke order set initiated - Neuro checks Speech, PT, OT - Check echocardiogram  in the setting of poorly controlled cholesterol  Hemoglobin A1c  pending Lipid panel-triglycerides 408, LDL unmeasurable-patient is allergic to statins, will clarify ASA 325 mg a day Neurology following    Urinary tract infection patient initially presents with complaints of urinary frequency. Urinalysis positive for signs of infection. Given Rocephin in the ED. - Continue Rocephin -  Follow-up urine culture   Diabetes mellitus type 2 - Hypoglycemic protocol - Hold metformin - CBG every before meals with sensitive SSI  Essential hypertension - Question need of permissive hypertension if neurology agrees  - Restart propranolol, when medically warranted  CAD s/p CABG/history of previous CVA without significant residual deficits - Continue aspirin  Hypothyroidism: Patient reports taking medications as advised. Initial TSH was 12.86 and free T4 was 1.14. Increased dose of levothyroxine from 100 g to 112 g  Chronic kidney disease stage III: Stable. - Continue to monitor    GERD  - Pharmacy substitution of Protonix    DVT prophylaxsis Lovenox  Code Status:  Full code   Family Communication: Discussed in detail with the patient, all imaging results, lab results explained to the patient   Disposition Plan:   Continue antibiotics, await urine culture     Consultants:  Neurology  Procedures:  None  Antibiotics: Anti-infectives    Start     Dose/Rate Route Frequency Ordered Stop   06/21/16 2000  cefTRIAXone (ROCEPHIN) 1 g in dextrose 5 % 50 mL IVPB     1 g 100 mL/hr over 30 Minutes Intravenous Every 24 hours 06/20/16 2314     06/20/16 2045  cefTRIAXone (ROCEPHIN) 1 g in dextrose 5 % 50 mL IVPB     1 g 100 mL/hr over 30 Minutes Intravenous  Once 06/20/16 2033 06/20/16 2139         HPI/Subjective: Patient is greatly improved per family.  Objective: Vitals:   06/21/16 0100 06/21/16 0200 06/21/16 0400 06/21/16 0600  BP:  122/60 (!) 113/54 (!) 90/52  Pulse:  78 77 72  Resp:  18 17 16   Temp:  98.6 F (37 C) 98.6 F (37 C) 98.9 F (37.2 C)  TempSrc:  Oral Oral   SpO2: 92% 97% 97% 98%  Weight:      Height:       No intake or output data in the 24 hours ending 06/21/16 0818  Exam:  Examination:  General exam: Appears calm and comfortable  Respiratory system: Clear to auscultation. Respiratory effort normal. Cardiovascular  system: S1 & S2 heard, RRR. No JVD, murmurs, rubs, gallops or clicks. No pedal edema. Gastrointestinal system: Abdomen is nondistended, soft and nontender. No organomegaly or masses felt. Normal bowel sounds heard. Central nervous system: No focal neurological deficits. Extremities: Symmetric 5 x 5 power. Skin: No rashes, lesions or ulcers Psychiatry: Judgement and insight  slightly impaired    Data Reviewed: I have personally reviewed following labs and imaging studies  Micro Results No results found for this or any previous visit (from the past 240 hour(s)).  Radiology Reports Dg Chest 2 View  Result Date: 06/20/2016 CLINICAL DATA:  Short of breath chest pain EXAM: CHEST  2 VIEW COMPARISON:  03/05/2016 FINDINGS: Postop CABG and cardiac loop recorder. Negative for heart failure. Heart size within normal limits. Lungs are clear without infiltrate effusion or mass. Calcified granuloma in the lingula is unchanged. IMPRESSION: No active cardiopulmonary disease. Electronically Signed   By: Franchot Gallo M.D.   On: 06/20/2016 18:03   Ct Head Wo Contrast  Result Date: 06/20/2016 CLINICAL DATA:  Confusion, shortness of breath. History of hypertension, diabetes. EXAM: CT HEAD WITHOUT CONTRAST TECHNIQUE: Contiguous axial images were obtained from the base of the skull through the vertex without intravenous contrast. COMPARISON:  CT HEAD January 28, 2016 FINDINGS: BRAIN: No intraparenchymal hemorrhage, mass effect nor midline shift. The ventricles and sulci are normal for age. Old bilateral basal ganglia/LEFT internal capsule and LEFT thalamus lacunar infarcts. Patchy supratentorial white matter hypodensities within normal range for patient's age, though non-specific are most compatible with chronic small vessel ischemic disease. No acute large vascular territory infarcts. No abnormal extra-axial fluid collections. Basal cisterns are patent. VASCULAR: Moderate calcific atherosclerosis of the carotid  siphons. SKULL: No skull fracture. No significant scalp soft tissue swelling. SINUSES/ORBITS: Trace paranasal sinus mucosal thickening, RIGHT concha bullosa. Mastoid air cells are well aerated. The included ocular globes and orbital contents are non-suspicious. Bilateral ocular lens implants. OTHER: Fatty replaced parotid glands. IMPRESSION: No acute intracranial process. Stable examination including moderate chronic small vessel ischemic disease and multiple lacunar infarcts. Electronically Signed   By: Elon Alas M.D.   On: 06/20/2016 18:43     CBC  Recent Labs Lab 06/20/16 1827  WBC 7.4  HGB 12.8  HCT 39.1  PLT 186  MCV 81.3  MCH 26.6  MCHC 32.7  RDW 15.5    Chemistries   Recent Labs Lab 06/20/16 1827  NA 136  K 3.6  CL 104  CO2 20*  GLUCOSE 174*  BUN 12  CREATININE 1.20*  CALCIUM 8.5*  AST 31  ALT 12*  ALKPHOS 57  BILITOT 0.6   ------------------------------------------------------------------------------------------------------------------ estimated creatinine clearance is 34.5 mL/min (A) (by C-G formula based on SCr of 1.2 mg/dL (H)). ------------------------------------------------------------------------------------------------------------------ No results for input(s): HGBA1C in the last 72 hours. ------------------------------------------------------------------------------------------------------------------  Recent Labs  06/21/16 0514  CHOL 192  HDL 27*  LDLCALC UNABLE TO CALCULATE IF TRIGLYCERIDE OVER 400 mg/dL  TRIG 408*  CHOLHDL 7.1   ------------------------------------------------------------------------------------------------------------------  Recent Labs  06/20/16 1827  TSH 12.873*   ------------------------------------------------------------------------------------------------------------------  No results for input(s): VITAMINB12, FOLATE, FERRITIN, TIBC, IRON, RETICCTPCT in the last 72 hours.  Coagulation profile No results  for input(s): INR, PROTIME in the last 168 hours.  No results for input(s): DDIMER in the last 72 hours.  Cardiac Enzymes No results for input(s): CKMB, TROPONINI, MYOGLOBIN in the last 168 hours.  Invalid input(s): CK ------------------------------------------------------------------------------------------------------------------ Invalid input(s): POCBNP   CBG:  Recent Labs Lab 06/20/16 2238 06/21/16 0628  GLUCAP 180* 170*       Studies: Dg Chest 2 View  Result Date: 06/20/2016 CLINICAL DATA:  Short of breath chest pain EXAM: CHEST  2 VIEW COMPARISON:  03/05/2016 FINDINGS: Postop CABG and cardiac loop recorder. Negative for heart failure. Heart size within normal limits. Lungs are clear without infiltrate effusion or mass. Calcified granuloma in the lingula is unchanged. IMPRESSION: No active cardiopulmonary disease. Electronically Signed   By: Franchot Gallo M.D.   On: 06/20/2016 18:03   Ct Head Wo Contrast  Result Date: 06/20/2016 CLINICAL DATA:  Confusion, shortness of breath. History of hypertension, diabetes. EXAM: CT HEAD WITHOUT CONTRAST TECHNIQUE: Contiguous axial images were obtained from the base of the skull through the vertex without intravenous contrast. COMPARISON:  CT HEAD January 28, 2016 FINDINGS: BRAIN: No intraparenchymal hemorrhage, mass effect nor midline shift. The ventricles and sulci are normal for age. Old bilateral basal ganglia/LEFT internal capsule and LEFT thalamus lacunar infarcts. Patchy supratentorial white matter hypodensities within normal range for patient's age, though non-specific are most compatible with chronic small vessel ischemic disease. No acute large vascular territory infarcts. No abnormal extra-axial fluid collections. Basal cisterns are patent. VASCULAR: Moderate calcific atherosclerosis of the carotid siphons. SKULL: No skull fracture. No significant scalp soft tissue swelling. SINUSES/ORBITS: Trace paranasal sinus mucosal thickening,  RIGHT concha bullosa. Mastoid air cells are well aerated. The included ocular globes and orbital contents are non-suspicious. Bilateral ocular lens implants. OTHER: Fatty replaced parotid glands. IMPRESSION: No acute intracranial process. Stable examination including moderate chronic small vessel ischemic disease and multiple lacunar infarcts. Electronically Signed   By: Elon Alas M.D.   On: 06/20/2016 18:43      Lab Results  Component Value Date   HGBA1C 11.0 (H) 03/06/2015   HGBA1C 13.0 11/11/2014   HGBA1C 13.0 (H) 10/29/2014   Lab Results  Component Value Date   MICROALBUR neg 11/11/2014   LDLCALC UNABLE TO CALCULATE IF TRIGLYCERIDE OVER 400 mg/dL 06/21/2016   CREATININE 1.20 (H) 06/20/2016       Scheduled Meds: . aspirin  325 mg Oral Daily  . cefTRIAXone (ROCEPHIN)  IV  1 g Intravenous Q24H  . enoxaparin (LOVENOX) injection  40 mg Subcutaneous Q24H  . insulin aspart  0-5 Units Subcutaneous QHS  . insulin aspart  0-9 Units Subcutaneous TID WC  . levothyroxine  100 mcg Oral QAC breakfast  . pantoprazole  40 mg Oral Daily  . traZODone  100-200 mg Oral QHS   Continuous Infusions: . sodium chloride 75 mL/hr at 06/20/16 2222     LOS: 0 days    Time spent: >30 MINS    [facility]  [facility] Pager [phone]. If 7PM-7AM, please contact night-coverage at [email or web address], password [facility] 06/21/2016, 8:18 AM  LOS: 0 days

## 2016-06-21 NOTE — Progress Notes (Signed)
Pharmacy  Re: Statins  Patient is off the floor for study. 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 Off Atorvastatin for a few years, may have caused muscle aches/leg pains. Daughter recalls Simvastatin in the distant past, but unsure of reaction.  She does not recall trying Rosuvastatin, but is willing for her mother to try it now.  They will report any muscle aches/leg pains. Also previously took Vascepa, but also off for at least a few years. Discussed with Dr. Allyson Sabal.  Kelvin Cellar, RPh Pager: [phone] 06/21/2016 1:17 PM

## 2016-06-21 NOTE — Progress Notes (Signed)
Off the floor

## 2016-06-21 NOTE — Progress Notes (Signed)
*  PRELIMINARY RESULTS* Vascular Ultrasound Carotid Duplex (Doppler) has been completed.  Findings suggest 1-39% internal carotid artery stenosis bilaterally. Vertebral arteries are patent with antegrade flow.  06/21/2016 1:52 PM Maudry Mayhew, BS, RVT, RDCS, RDMS

## 2016-06-21 NOTE — Progress Notes (Signed)
Patient is greatly improved per family.  Patient is awake, alert, not oriented to month or year. She has good strength bilaterally with intact sensation.  Confusion in unsteadiness in the setting of urinary tract infection and very likely dementia.  I did advise the patient that it would be reasonable to follow-up as an outpatient for memory evaluation.  With her improvement, I don't think any further evaluation needs to be performed. Please call with any further questions or concerns, neurology to sign off.  Roland Rack, MD [facility] [phone]  If 7pm- 7am, please page neurology on call as listed in [place].

## 2016-06-22 DIAGNOSIS — R4701 Aphasia: Secondary | ICD-10-CM

## 2016-06-22 LAB — HEMOGLOBIN A1C
HEMOGLOBIN A1C: 7.2 % — AB (ref 4.8–5.6)
Mean Plasma Glucose: 160 mg/dL

## 2016-06-22 LAB — GLUCOSE, CAPILLARY
GLUCOSE-CAPILLARY: 200 mg/dL — AB (ref 65–99)
Glucose-Capillary: 186 mg/dL — ABNORMAL HIGH (ref 65–99)
Glucose-Capillary: 182 mg/dL — ABNORMAL HIGH (ref 65–99)
Glucose-Capillary: 154 mg/dL — ABNORMAL HIGH (ref 65–99)

## 2016-06-22 NOTE — NC FL2 (Signed)
[place] LEVEL OF CARE SCREENING TOOL     IDENTIFICATION  Patient Name: Leah Levine Birthdate: [birth year] Sex: female Admission Date (Current Location): 06/20/2016  [facility] and [place] Number:  Herbalist and Address:  [facility], [place] [address] [place], [place] [address]      Provider Number: [id number]  Attending Physician Name and Address:  Reyne Dumas, MD  Relative Name and Phone Number:  Beckie Busing; [phone]    Current Level of Care: Hospital Recommended Level of Care: [place] Prior Approval Number:    Date Approved/Denied:   PASRR Number: [id number] (Eff. 06/22/16)  Discharge Plan: SNF    Current Diagnoses: Patient Active Problem List   Diagnosis Date Noted  . Aphasia   . TIA (transient ischemic attack) 06/20/2016  . Chest pain 03/06/2016  . Arm pain, anterior, left 03/06/2016  . Orthostatic hypotension 02/21/2016  . Late effect of cerebrovascular accident 11/10/2015  . Foraminal stenosis of cervical region 11/10/2015  . Frequent falls 11/10/2015  . Hypertriglyceridemia 09/05/2015  . Neck muscle spasm 08/26/2015  . CKD stage 3 due to type 2 diabetes mellitus ([place]) 08/26/2015  . Intertrigo 03/14/2015  . Stroke (cerebrum) ([place]) 03/05/2015  . Expressive aphasia 03/05/2015  . Syncope 12/09/2013  . Coronary artery disease 09/18/2013  . Hypothyroidism 06/18/2013  . S/P CABG x 4 12/01/2012  . Vitamin B12 deficiency 12/05/2010  . Personal history of colonic polyps 12/05/2010  . Esophageal dysphagia 11/20/2010  . Benign neoplasm of colon 11/20/2010  . Gastritis, chronic 11/20/2010  . Allergic rhinitis 07/03/2010  . Tremor   . GERD (gastroesophageal reflux disease)   . Hyperlipidemia   . Hypertension   . Urinary incontinence   . Type 2 diabetes mellitus with renal manifestations (HCC)     Orientation RESPIRATION BLADDER Height & Weight     Self, Situation,  Place  Normal Incontinent Weight: 176 lb 14.4 oz (80.2 kg) Height:  5' (152.4 cm)  BEHAVIORAL SYMPTOMS/MOOD NEUROLOGICAL BOWEL NUTRITION STATUS      Continent Diet (Carb modified)  AMBULATORY STATUS COMMUNICATION OF NEEDS Skin   Limited Assist Verbally Normal                       Personal Care Assistance Level of Assistance  Bathing, Feeding, Dressing Bathing Assistance: Limited assistance Feeding assistance: Independent Dressing Assistance: Limited assistance     Functional Limitations Info  Sight, Hearing, Speech Sight Info: Impaired Hearing Info: Adequate Speech Info: Adequate    SPECIAL CARE FACTORS FREQUENCY  PT (By licensed PT), OT (By licensed OT)     PT Frequency: Evaluated 3/29 and a minimum of 3X per week therapy recommended OT Frequency: Seen 3/29 and a minimum of 2X per week therapy recommended            Contractures Contractures Info: Not present    Additional Factors Info  Code Status, Allergies, Insulin Sliding Scale Code Status Info: Full Allergies Info: Celebrex, Fenofibrate, Statins   Insulin Sliding Scale Info: 0-9 Units 3X per day with meals; 0-5 Units at bedtime       Current Medications (06/22/2016):  This is the current hospital active medication list Current Facility-Administered Medications  Medication Dose Route Frequency Provider Last Rate Last Dose  . 0.9 %  sodium chloride infusion   Intravenous Continuous Norval Morton, MD 75 mL/hr at 06/20/16 2222    . acetaminophen (TYLENOL) tablet 650 mg  650 mg Oral  Q4H PRN Norval Morton, MD       Or  . acetaminophen (TYLENOL) solution 650 mg  650 mg Per Tube Q4H PRN Norval Morton, MD       Or  . acetaminophen (TYLENOL) suppository 650 mg  650 mg Rectal Q4H PRN Norval Morton, MD      . aspirin EC tablet 325 mg  325 mg Oral Daily Norval Morton, MD   325 mg at 06/22/16 1031  . cefTRIAXone (ROCEPHIN) 1 g in dextrose 5 % 50 mL IVPB  1 g Intravenous Q24H Laren Everts, RPH   1 g at  06/21/16 2032  . enoxaparin (LOVENOX) injection 40 mg  40 mg Subcutaneous Q24H Norval Morton, MD   40 mg at 06/21/16 2233  . insulin aspart (novoLOG) injection 0-5 Units  0-5 Units Subcutaneous QHS Norval Morton, MD   3 Units at 06/21/16 2233  . insulin aspart (novoLOG) injection 0-9 Units  0-9 Units Subcutaneous TID WC Norval Morton, MD   2 Units at 06/22/16 1233  . ipratropium-albuterol (DUONEB) 0.5-2.5 (3) MG/3ML nebulizer solution 3 mL  3 mL Nebulization Q2H PRN Rondell A Tamala Julian, MD      . levothyroxine (SYNTHROID, LEVOTHROID) tablet 112 mcg  112 mcg Oral QAC breakfast Reyne Dumas, MD   112 mcg at 06/22/16 0752  . morphine 4 MG/ML injection 2 mg  2 mg Intravenous Once PRN Norval Morton, MD      . pantoprazole (PROTONIX) EC tablet 40 mg  40 mg Oral Daily Norval Morton, MD   40 mg at 06/22/16 1032  . rosuvastatin (CRESTOR) tablet 20 mg  20 mg Oral Daily Reyne Dumas, MD   20 mg at 06/22/16 1031  . senna-docusate (Senokot-S) tablet 1 tablet  1 tablet Oral QHS PRN Norval Morton, MD      . traMADol (ULTRAM) tablet 50 mg  50 mg Oral Q6H PRN Norval Morton, MD   50 mg at 06/20/16 2238  . traZODone (DESYREL) tablet 100-200 mg  100-200 mg Oral QHS Norval Morton, MD   100 mg at 06/21/16 2233     Discharge Medications: Please see discharge summary for a list of discharge medications.  Relevant Imaging Results:  Relevant Lab Results:   Additional Information [phone].  Sable Feil, LCSW

## 2016-06-22 NOTE — Care Management Note (Signed)
Case Management Note  Patient Details  Name: Neil Errickson MRN: [id number] Date of Birth: 05-03-1932  Subjective/Objective:  Pt in with UTI. She is from home with family.                   Action/Plan: PT/OT recommending SNF. Both patient and her daughter are refusing SNF. CM inquired about [facility] services. The daughter does not feel that HHPT/OT are useful and does not want this ordered either. If the MD feels she needs [facility] RN the daughter is open to this service. CM following.   Expected Discharge Date:  06/23/16               Expected Discharge Plan:  Home/Self Care  In-House Referral:     Discharge planning Services  CM Consult  Post Acute Care Choice:    Choice offered to:     DME Arranged:    DME Agency:     HH Arranged:    HH Agency:     Status of Service:  In process, will continue to follow  If discussed at Long Length of Stay Meetings, dates discussed:    Additional Comments:  Pollie Friar, RN 06/22/2016, 1:01 PM

## 2016-06-22 NOTE — Clinical Social Work Note (Signed)
Patient is Medicare A&B and was made inpatient on 3/29 at 11:03 pm. Patient can discharge if medially stable on Sunday, 4/1. Joanna Hews, MD and daughter advised.   Larene Beach at [facility] requested that transport be called on patient after 2 pm on Sunday and that she be contacted [phone] - mobile) once transport called. She also requested that daughter come when patient discharges so that admissions paperwork can be completed.  Daughter contacted and updated regarding facility request.  Charna Neeb Givens, MSW, LCSW Licensed Clinical Social Worker [place] [phone]

## 2016-06-22 NOTE — Clinical Social Work Placement (Signed)
   CLINICAL SOCIAL WORK PLACEMENT  NOTE  Date:  06/22/2016  Patient Details  Name: Leah Levine MRN: [id number] Date of Birth: 04/24/1932  Clinical Social Work is seeking post-discharge placement for this patient at the [place] level of care (*CSW will initial, date and re-position this form in  chart as items are completed):  Yes   Patient/family provided with [place] Work Department's list of facilities offering this level of care within the geographic area requested by the patient (or if unable, by the patient's family).  Yes   Patient/family informed of their freedom to choose among providers that offer the needed level of care, that participate in Medicare, Medicaid or managed care program needed by the patient, have an available bed and are willing to accept the patient.  Yes   Patient/family informed of [facility] [place]'s ownership interest in [facility] and [facility], as well as of the fact that they are under no obligation to receive care at these facilities.  PASRR submitted to EDS on 06/22/16     PASRR number received on 06/22/16     Existing PASRR number confirmed on       FL2 transmitted to all facilities in geographic area requested by pt/family on 06/22/16     FL2 transmitted to all facilities within larger geographic area on       Patient informed that his/her managed care company has contracts with or will negotiate with certain facilities, including the following:        Yes   Patient/family informed of bed offers received.  Patient chooses bed at [facility]     Physician recommends and patient chooses bed at      Patient to be transferred to [facility] on  .  Patient to be transferred to facility by       Patient family notified on   of transfer.  Name of family member notified:        PHYSICIAN       Additional Comment:    _______________________________________________ Sable Feil, LCSW 06/22/2016, 7:57 PM

## 2016-06-22 NOTE — Progress Notes (Signed)
[facility] PROGRESS NOTE  Leah Levine JIR:[id number] DOB: 03/15/1933 DOA: 06/20/2016   PCP: Kenn File, MD     Assessment/Plan: Principal Problem:   Expressive aphasia Active Problems:   GERD (gastroesophageal reflux disease)   Hyperlipidemia   Type 2 diabetes mellitus with renal manifestations (HCC)   S/P CABG x 4   Hypothyroidism   CKD stage 3 due to type 2 diabetes mellitus ([place])   TIA (transient ischemic attack)    83 y.o. female with medical history significant of HTN, HLD, CAD s/p CABG, hypothyroidism, CKD stage III, and CVA; who presents after having inability to speak. History is obtained from the patient daughter who is present at bedside as well as the patient. The patient and her 2 daughters had just finished eating lunch around 2:45- 3:15PM when patient complained of feeling dizzy/lightheaded with a funny sensation in her head and face.Initial workup revealed urinalysis suggestive of infection, TSH 12.873, FT4 1.14, and all other labs are relatively within normal limits. CT scan of the brain showed no acute abnormalities. Patient admitted for stroke workup   Assessment/Plan Expressive aphasia 2/2  vs TIA  suspected TIA vs. CVA: Acute. Patient reports acute onset of inability to speak in generalized weakness.  Telemetry shows normal sinus rhythm MRI brain  No acute intracranial abnormality.Confusion in unsteadiness in the setting of urinary tract infection and very likely dementia Neuro checks have improved after treatment of UTI PT OT recommended SNF 2-D echo shows EF of 75-10%, grade 2 diastolic dysfunction Hemoglobin A1c 7.2 Lipid panel-triglycerides 408, LDL unmeasurable-patient started on Crestor Will continue ASA 325 mg a day Neurology has signed off    Urinary tract infection patient initially presents with complaints of urinary frequency. Urinalysis positive for signs of infection. Urine culture positive for gram-negative rods ,  sensitivity in speciation pending  Continue Rocephin  Follow-up urine culture   Diabetes mellitus type 2 - Hypoglycemic protocol - Hold metformin. Hemoglobin A1c 7.2 Continue SSI  Essential hypertension - Question need of permissive hypertension if neurology agrees  - Restart propranolol, when medically warranted  CAD s/p CABG/history of previous CVA without significant residual deficits - Continue aspirin  Hypothyroidism: Patient reports taking medications as advised. Initial TSH was 12.86 and free T4 was 1.14.Increased dose of levothyroxine from 100 g to 112 g  Chronic kidney disease stage III: Stable. Will check BMP tomorrow   GERD  Continue PPI   DVT prophylaxsis Lovenox  Code Status:  Full code   Family Communication: Discussed in detail with the patient, all imaging results, lab results explained to the patient   Disposition Plan:   Continue antibiotics, anticipate discharge tomorrow     Consultants:  Neurology  Procedures:  None  Antibiotics: Anti-infectives    Start     Dose/Rate Route Frequency Ordered Stop   06/21/16 2000  cefTRIAXone (ROCEPHIN) 1 g in dextrose 5 % 50 mL IVPB     1 g 100 mL/hr over 30 Minutes Intravenous Every 24 hours 06/20/16 2314     06/20/16 2045  cefTRIAXone (ROCEPHIN) 1 g in dextrose 5 % 50 mL IVPB     1 g 100 mL/hr over 30 Minutes Intravenous  Once 06/20/16 2033 06/20/16 2139         HPI/Subjective: Daughter by the bedside , patient doing well, no focal deficits   Objective: Vitals:   06/21/16 1802 06/21/16 2214 06/22/16 0221 06/22/16 0646  BP: (!) 147/64 111/80 (!) 151/57 125/73  Pulse:  73 73 68 61  Resp: 20 20 20 20   Temp: 99 F (37.2 C) 98.5 F (36.9 C) 97.7 F (36.5 C) 97.7 F (36.5 C)  TempSrc: Oral Oral Oral Oral  SpO2: 97% 96% 99% 100%  Weight:      Height:        Intake/Output Summary (Last 24 hours) at 06/22/16 0953 Last data filed at 06/22/16 0130  Gross per 24 hour  Intake              1770 ml  Output              450 ml  Net             1320 ml    Exam:  Examination:  General exam: Appears calm and comfortable  Respiratory system: Clear to auscultation. Respiratory effort normal. Cardiovascular system: S1 & S2 heard, RRR. No JVD, murmurs, rubs, gallops or clicks. No pedal edema. Gastrointestinal system: Abdomen is nondistended, soft and nontender. No organomegaly or masses felt. Normal bowel sounds heard. Central nervous system: No focal neurological deficits. Extremities: Symmetric 5 x 5 power. Skin: No rashes, lesions or ulcers Psychiatry: Judgement and insight  slightly impaired    Data Reviewed: I have personally reviewed following labs and imaging studies  Micro Results Recent Results (from the past 240 hour(s))  Urine culture     Status: Abnormal (Preliminary result)   Collection Time: 06/20/16  8:34 PM  Result Value Ref Range Status   Specimen Description URINE, RANDOM  Final   Special Requests NONE  Final   Culture >=100,000 COLONIES/mL GRAM NEGATIVE RODS (A)  Final   Report Status PENDING  Incomplete    Radiology Reports Dg Chest 2 View  Result Date: 06/20/2016 CLINICAL DATA:  Short of breath chest pain EXAM: CHEST  2 VIEW COMPARISON:  03/05/2016 FINDINGS: Postop CABG and cardiac loop recorder. Negative for heart failure. Heart size within normal limits. Lungs are clear without infiltrate effusion or mass. Calcified granuloma in the lingula is unchanged. IMPRESSION: No active cardiopulmonary disease. Electronically Signed   By: Franchot Gallo M.D.   On: 06/20/2016 18:03   Ct Head Wo Contrast  Result Date: 06/20/2016 CLINICAL DATA:  Confusion, shortness of breath. History of hypertension, diabetes. EXAM: CT HEAD WITHOUT CONTRAST TECHNIQUE: Contiguous axial images were obtained from the base of the skull through the vertex without intravenous contrast. COMPARISON:  CT HEAD January 28, 2016 FINDINGS: BRAIN: No intraparenchymal hemorrhage, mass effect  nor midline shift. The ventricles and sulci are normal for age. Old bilateral basal ganglia/LEFT internal capsule and LEFT thalamus lacunar infarcts. Patchy supratentorial white matter hypodensities within normal range for patient's age, though non-specific are most compatible with chronic small vessel ischemic disease. No acute large vascular territory infarcts. No abnormal extra-axial fluid collections. Basal cisterns are patent. VASCULAR: Moderate calcific atherosclerosis of the carotid siphons. SKULL: No skull fracture. No significant scalp soft tissue swelling. SINUSES/ORBITS: Trace paranasal sinus mucosal thickening, RIGHT concha bullosa. Mastoid air cells are well aerated. The included ocular globes and orbital contents are non-suspicious. Bilateral ocular lens implants. OTHER: Fatty replaced parotid glands. IMPRESSION: No acute intracranial process. Stable examination including moderate chronic small vessel ischemic disease and multiple lacunar infarcts. Electronically Signed   By: Elon Alas M.D.   On: 06/20/2016 18:43   Mr Brain Wo Contrast  Result Date: 06/21/2016 CLINICAL DATA:  Suspected TIA.  Episode of dizziness and aphasia. EXAM: MRI HEAD WITHOUT CONTRAST TECHNIQUE: Multiplanar, multiecho pulse sequences of  the brain and surrounding structures were obtained without intravenous contrast. COMPARISON:  06/20/2016 head CT and 12/05/2015 MRI FINDINGS: Brain: There is no evidence of acute infarct, intracranial hemorrhage, mass, midline shift, or extra-axial fluid collection. There is mild generalized cerebral and cerebellar atrophy. A chronic lacunar infarct in the left internal capsule is unchanged from the prior MRI, as are smaller foci of T2 hyperintensity in the basal ganglia bilaterally which may reflect chronic lacunar infarcts and/or dilated perivascular spaces. Patchy T2 hyperintensities throughout the subcortical and deep cerebral white matter bilaterally are unchanged from the prior  MRI and nonspecific but compatible with mild-to-moderate chronic small vessel ischemic disease. Vascular: Major intracranial vascular flow voids are preserved. Skull and upper cervical spine: Unremarkable bone marrow signal. Sinuses/Orbits: Bilateral cataract extraction. Paranasal sinuses and mastoid air cells are clear. Other: None. IMPRESSION: 1. No acute intracranial abnormality. 2. Mild-to-moderate chronic small vessel ischemic disease. Electronically Signed   By: Logan Bores M.D.   On: 06/21/2016 09:48     CBC  Recent Labs Lab 06/20/16 1827  WBC 7.4  HGB 12.8  HCT 39.1  PLT 186  MCV 81.3  MCH 26.6  MCHC 32.7  RDW 15.5    Chemistries   Recent Labs Lab 06/20/16 1827  NA 136  K 3.6  CL 104  CO2 20*  GLUCOSE 174*  BUN 12  CREATININE 1.20*  CALCIUM 8.5*  AST 31  ALT 12*  ALKPHOS 57  BILITOT 0.6   ------------------------------------------------------------------------------------------------------------------ estimated creatinine clearance is 34.5 mL/min (A) (by C-G formula based on SCr of 1.2 mg/dL (H)). ------------------------------------------------------------------------------------------------------------------  Recent Labs  06/21/16 0514  HGBA1C 7.2*   ------------------------------------------------------------------------------------------------------------------  Recent Labs  06/21/16 0514  CHOL 192  HDL 27*  LDLCALC UNABLE TO CALCULATE IF TRIGLYCERIDE OVER 400 mg/dL  TRIG 408*  CHOLHDL 7.1   ------------------------------------------------------------------------------------------------------------------  Recent Labs  06/20/16 1827  TSH 12.873*   ------------------------------------------------------------------------------------------------------------------ No results for input(s): VITAMINB12, FOLATE, FERRITIN, TIBC, IRON, RETICCTPCT in the last 72 hours.  Coagulation profile No results for input(s): INR, PROTIME in the last 168  hours.  No results for input(s): DDIMER in the last 72 hours.  Cardiac Enzymes No results for input(s): CKMB, TROPONINI, MYOGLOBIN in the last 168 hours.  Invalid input(s): CK ------------------------------------------------------------------------------------------------------------------ Invalid input(s): POCBNP   CBG:  Recent Labs Lab 06/21/16 0628 06/21/16 1131 06/21/16 1641 06/21/16 2202 06/22/16 0645  GLUCAP 170* 212* 163* 227* 186*       Studies: Dg Chest 2 View  Result Date: 06/20/2016 CLINICAL DATA:  Short of breath chest pain EXAM: CHEST  2 VIEW COMPARISON:  03/05/2016 FINDINGS: Postop CABG and cardiac loop recorder. Negative for heart failure. Heart size within normal limits. Lungs are clear without infiltrate effusion or mass. Calcified granuloma in the lingula is unchanged. IMPRESSION: No active cardiopulmonary disease. Electronically Signed   By: Franchot Gallo M.D.   On: 06/20/2016 18:03   Ct Head Wo Contrast  Result Date: 06/20/2016 CLINICAL DATA:  Confusion, shortness of breath. History of hypertension, diabetes. EXAM: CT HEAD WITHOUT CONTRAST TECHNIQUE: Contiguous axial images were obtained from the base of the skull through the vertex without intravenous contrast. COMPARISON:  CT HEAD January 28, 2016 FINDINGS: BRAIN: No intraparenchymal hemorrhage, mass effect nor midline shift. The ventricles and sulci are normal for age. Old bilateral basal ganglia/LEFT internal capsule and LEFT thalamus lacunar infarcts. Patchy supratentorial white matter hypodensities within normal range for patient's age, though non-specific are most compatible with chronic small vessel ischemic disease. No acute  large vascular territory infarcts. No abnormal extra-axial fluid collections. Basal cisterns are patent. VASCULAR: Moderate calcific atherosclerosis of the carotid siphons. SKULL: No skull fracture. No significant scalp soft tissue swelling. SINUSES/ORBITS: Trace paranasal sinus  mucosal thickening, RIGHT concha bullosa. Mastoid air cells are well aerated. The included ocular globes and orbital contents are non-suspicious. Bilateral ocular lens implants. OTHER: Fatty replaced parotid glands. IMPRESSION: No acute intracranial process. Stable examination including moderate chronic small vessel ischemic disease and multiple lacunar infarcts. Electronically Signed   By: Elon Alas M.D.   On: 06/20/2016 18:43   Mr Brain Wo Contrast  Result Date: 06/21/2016 CLINICAL DATA:  Suspected TIA.  Episode of dizziness and aphasia. EXAM: MRI HEAD WITHOUT CONTRAST TECHNIQUE: Multiplanar, multiecho pulse sequences of the brain and surrounding structures were obtained without intravenous contrast. COMPARISON:  06/20/2016 head CT and 12/05/2015 MRI FINDINGS: Brain: There is no evidence of acute infarct, intracranial hemorrhage, mass, midline shift, or extra-axial fluid collection. There is mild generalized cerebral and cerebellar atrophy. A chronic lacunar infarct in the left internal capsule is unchanged from the prior MRI, as are smaller foci of T2 hyperintensity in the basal ganglia bilaterally which may reflect chronic lacunar infarcts and/or dilated perivascular spaces. Patchy T2 hyperintensities throughout the subcortical and deep cerebral white matter bilaterally are unchanged from the prior MRI and nonspecific but compatible with mild-to-moderate chronic small vessel ischemic disease. Vascular: Major intracranial vascular flow voids are preserved. Skull and upper cervical spine: Unremarkable bone marrow signal. Sinuses/Orbits: Bilateral cataract extraction. Paranasal sinuses and mastoid air cells are clear. Other: None. IMPRESSION: 1. No acute intracranial abnormality. 2. Mild-to-moderate chronic small vessel ischemic disease. Electronically Signed   By: Logan Bores M.D.   On: 06/21/2016 09:48      Lab Results  Component Value Date   HGBA1C 7.2 (H) 06/21/2016   HGBA1C 11.0 (H)  03/06/2015   HGBA1C 13.0 11/11/2014   Lab Results  Component Value Date   MICROALBUR neg 11/11/2014   LDLCALC UNABLE TO CALCULATE IF TRIGLYCERIDE OVER 400 mg/dL 06/21/2016   CREATININE 1.20 (H) 06/20/2016       Scheduled Meds: . aspirin  325 mg Oral Daily  . cefTRIAXone (ROCEPHIN)  IV  1 g Intravenous Q24H  . enoxaparin (LOVENOX) injection  40 mg Subcutaneous Q24H  . insulin aspart  0-5 Units Subcutaneous QHS  . insulin aspart  0-9 Units Subcutaneous TID WC  . levothyroxine  112 mcg Oral QAC breakfast  . pantoprazole  40 mg Oral Daily  . rosuvastatin  20 mg Oral Daily  . traZODone  100-200 mg Oral QHS   Continuous Infusions: . sodium chloride 75 mL/hr at 06/20/16 2222     LOS: 1 day    Time spent: >30 MINS    [facility]  [facility] Pager [phone]. If 7PM-7AM, please contact night-coverage at [email or web address], password [facility] 06/22/2016, 9:53 AM  LOS: 1 day

## 2016-06-22 NOTE — Clinical Social Work Note (Signed)
Clinical Social Work Assessment  Patient Details  Name: Leah Levine MRN: [id number] Date of Birth: 1932/08/31  Date of referral:  06/22/16               Reason for consult:  Facility Placement                Permission sought to share information with:  Family Supports Permission granted to share information::  Yes, Verbal Permission Granted  Name::     Jenelle Mages  Agency::     Relationship::  Daughter  Contact Information:  [phone]  Housing/Transportation Living arrangements for the past 2 months:  [place] of Information:  Adult Children (Daughter Vaughan Basta) Patient Interpreter Needed:  None Criminal Activity/Legal Involvement Pertinent to Current Situation/Hospitalization:  No - Comment as needed Significant Relationships:  Adult Children Lives with:  Adult Children (Patient and daughter live together) Do you feel safe going back to the place where you live?  No Need for family participation in patient care:  Yes (Comment)  Care giving concerns:  CSW talked with daughter regarding and she is primary caretaker for patient and is in agreement with ST rehab for patient. Daughter reported that she is primary caretaker for patient and is tired, especially since patient has been in hospital and she has been staying in hospital with her mother.    Social Worker assessment / plan:  CSW talked in initially with patient at the bedside regarding recommendation of ST rehab. Patient informed CSW that her daughter was at hospital and requested that [place] talk with her as she has trouble talking sometimes. CSW talked shortly afterwards with daughter, Vaughan Basta. Ms. Caryn Section reported that her mother has never been to a facility for [place] re[facility]. Daughter reported that she moved in with her mother about a year ago. She is an only child and her son and daughter-in-law provided assistance as they can with her mother.  Daughter in agreement with ST rehab and the facility search  process explained and a SNF list for [facility] provided. Daughter requested [facility] as that is close to her home.   Employment status:  Retired Forensic scientist:  [employer] PT Recommendations:  [place] / Referral to community resources:  [place] (Daughter provided with [employer] for [facility])  Patient/Family's Response to care: No concerns expressed regarding patient's care during hospitalization.  Patient/Family's Understanding of and Emotional Response to Diagnosis, Current Treatment, and Prognosis:  Not discussed.  Emotional Assessment Appearance:  Appears stated age Attitude/Demeanor/Rapport:  Other (Appropriate) Affect (typically observed):  Appropriate Orientation:  Oriented to Self, Oriented to Place, Oriented to Situation Alcohol / Substance use:  Never Used Psych involvement (Current and /or in the community):  No (Comment)  Discharge Needs  Concerns to be addressed:  Discharge Planning Concerns Readmission within the last 30 days:  No Current discharge risk:  None Barriers to Discharge:  No Barriers Identified   Sable Feil, LCSW 06/22/2016, 7:49 PM

## 2016-06-23 DIAGNOSIS — E7800 Pure hypercholesterolemia, unspecified: Secondary | ICD-10-CM

## 2016-06-23 LAB — CBC
HCT: 39.6 % (ref 36.0–46.0)
Hemoglobin: 13.1 g/dL (ref 12.0–15.0)
MCH: 26.5 pg (ref 26.0–34.0)
MCHC: 33.1 g/dL (ref 30.0–36.0)
MCV: 80.2 fL (ref 78.0–100.0)
PLATELETS: 174 10*3/uL (ref 150–400)
RBC: 4.94 MIL/uL (ref 3.87–5.11)
RDW: 15.5 % (ref 11.5–15.5)
WBC: 6.6 10*3/uL (ref 4.0–10.5)

## 2016-06-23 LAB — URINE CULTURE: Culture: >=100000 — AB

## 2016-06-23 LAB — BASIC METABOLIC PANEL
Anion gap: 10 (ref 5–15)
BUN: 8 mg/dL (ref 6–20)
CALCIUM: 9.1 mg/dL (ref 8.9–10.3)
CO2: 26 mmol/L (ref 22–32)
CREATININE: 0.92 mg/dL (ref 0.44–1.00)
Chloride: 102 mmol/L (ref 101–111)
GFR calc non Af Amer: 57 mL/min — ABNORMAL LOW (ref >60)
Glucose, Bld: 158 mg/dL — ABNORMAL HIGH (ref 65–99)
Potassium: 3.8 mmol/L (ref 3.5–5.1)
SODIUM: 138 mmol/L (ref 135–145)

## 2016-06-23 LAB — GLUCOSE, CAPILLARY
GLUCOSE-CAPILLARY: 165 mg/dL — AB (ref 65–99)
GLUCOSE-CAPILLARY: 189 mg/dL — AB (ref 65–99)
Glucose-Capillary: 238 mg/dL — ABNORMAL HIGH (ref 65–99)
Glucose-Capillary: 163 mg/dL — ABNORMAL HIGH (ref 65–99)

## 2016-06-23 MED ORDER — CEPHALEXIN 500 MG PO CAPS
500.0000 mg | ORAL_CAPSULE | Freq: Two times a day (BID) | ORAL | Status: DC
  Administered 2016-06-23 – 2016-06-24 (×3): 500 mg via ORAL
  Filled 2016-06-23 (×3): qty 1

## 2016-06-23 NOTE — Progress Notes (Signed)
[facility] PROGRESS NOTE  Leah Levine MBT:[id number] DOB: 25-Feb-1933 DOA: 06/20/2016   PCP: Kenn File, MD     Assessment/Plan: Principal Problem:   Expressive aphasia Active Problems:   GERD (gastroesophageal reflux disease)   Hyperlipidemia   Type 2 diabetes mellitus with renal manifestations (HCC)   S/P CABG x 4   Hypothyroidism   CKD stage 3 due to type 2 diabetes mellitus ([place])   TIA (transient ischemic attack)   Aphasia    83 y.o. female with medical history significant of HTN, HLD, CAD s/p CABG, hypothyroidism, CKD stage III, and CVA; who presents after having inability to speak. History is obtained from the patient daughter who is present at bedside as well as the patient. The patient and her 2 daughters had just finished eating lunch around 2:45- 3:15PM when patient complained of feeling dizzy/lightheaded with a funny sensation in her head and face.Initial workup revealed urinalysis suggestive of infection, TSH 12.873, FT4 1.14, and all other labs are relatively within normal limits. CT scan of the brain showed no acute abnormalities. Patient admitted for stroke workup   Assessment/Plan Expressive aphasia , probable TIA  suspected TIA vs. CVA:  Without recurrence.  Telemetry shows normal sinus rhythm MRI brain  No acute intracranial abnormality.Confusion in unsteadiness in the setting of urinary tract infection and very likely dementia Neuro checks have improved after treatment of UTI PT OT recommended SNF, likely discharge 4/1 2-D echo shows EF of 53-64%, grade 2 diastolic dysfunction Hemoglobin A1c 7.2 Lipid panel-triglycerides 408, LDL unmeasurable-patient started on Crestor, repeat lipid panel tomorrow to check LDL Will continue ASA 325 mg a day      Urinary tract infection patient initially presents with complaints of urinary frequency. Urinalysis positive for signs of infection. Urine culture positive for Escherichia coli , change  Rocephin to Keflex and continue for 5 more days    Diabetes mellitus type 2 - Hypoglycemic protocol - Hold metformin. Hemoglobin A1c 7.2 Continue SSI  Essential hypertension Stable Hold propranolol, but now  CAD s/p CABG/history of previous CVA without significant residual deficits - Continue aspirin  Hypothyroidism: Patient reports taking medications as advised. Initial TSH was 12.86 and free T4 was 1.14.Increased dose of levothyroxine from 100 g to 112 g  Chronic kidney disease stage III: Stable. Stable   GERD  Continue PPI   DVT prophylaxsis Lovenox  Code Status:  Full code   Family Communication: Discussed in detail with the patient, all imaging results, lab results explained to the patient   Disposition Plan:   SNF 4/1     Consultants:  Neurology  Procedures:  None  Antibiotics: Anti-infectives    Start     Dose/Rate Route Frequency Ordered Stop   06/23/16 1000  cephALEXin (KEFLEX) capsule 500 mg     500 mg Oral Every 12 hours 06/23/16 0948 06/28/16 0959   06/21/16 2000  cefTRIAXone (ROCEPHIN) 1 g in dextrose 5 % 50 mL IVPB  Status:  Discontinued     1 g 100 mL/hr over 30 Minutes Intravenous Every 24 hours 06/20/16 2314 06/23/16 0948   06/20/16 2045  cefTRIAXone (ROCEPHIN) 1 g in dextrose 5 % 50 mL IVPB     1 g 100 mL/hr over 30 Minutes Intravenous  Once 06/20/16 2033 06/20/16 2139         HPI/Subjective: Patient somnolent but arousable, surroundings.No difficulty swallowing reported by daughter  Objective: Vitals:   06/22/16 1746 06/22/16 2205 06/23/16 6803 06/23/16 2122  BP: 132/71 (!) 142/76 136/61 (!) 162/71  Pulse: 72 81 66 76  Resp: 17 18 20 20   Temp: 98.7 F (37.1 C) 98 F (36.7 C) 97.7 F (36.5 C) 98.1 F (36.7 C)  TempSrc: Oral Oral Axillary Oral  SpO2: 98% 99% 94% 95%  Weight:      Height:        Intake/Output Summary (Last 24 hours) at 06/23/16 0950 Last data filed at 06/22/16 1400  Gross per 24 hour  Intake               120 ml  Output                0 ml  Net              120 ml    Exam:  Examination:  General exam: Appears calm and comfortable  Respiratory system: Clear to auscultation. Respiratory effort normal. Cardiovascular system: S1 & S2 heard, RRR. No JVD, murmurs, rubs, gallops or clicks. No pedal edema. Gastrointestinal system: Abdomen is nondistended, soft and nontender. No organomegaly or masses felt. Normal bowel sounds heard. Central nervous system: No focal neurological deficits. Extremities: Symmetric 5 x 5 power. Skin: No rashes, lesions or ulcers Psychiatry: Judgement and insight  slightly impaired    Data Reviewed: I have personally reviewed following labs and imaging studies  Micro Results Recent Results (from the past 240 hour(s))  Urine culture     Status: Abnormal   Collection Time: 06/20/16  8:34 PM  Result Value Ref Range Status   Specimen Description URINE, RANDOM  Final   Special Requests NONE  Final   Culture >=100,000 COLONIES/mL ESCHERICHIA COLI (A)  Final   Report Status 06/23/2016 FINAL  Final   Organism ID, Bacteria ESCHERICHIA COLI (A)  Final      Susceptibility   Escherichia coli - MIC*    AMPICILLIN <=2 SENSITIVE Sensitive     CEFAZOLIN <=4 SENSITIVE Sensitive     CEFTRIAXONE <=1 SENSITIVE Sensitive     CIPROFLOXACIN <=0.25 SENSITIVE Sensitive     GENTAMICIN <=1 SENSITIVE Sensitive     IMIPENEM <=0.25 SENSITIVE Sensitive     NITROFURANTOIN <=16 SENSITIVE Sensitive     TRIMETH/SULFA <=20 SENSITIVE Sensitive     AMPICILLIN/SULBACTAM <=2 SENSITIVE Sensitive     PIP/TAZO <=4 SENSITIVE Sensitive     Extended ESBL NEGATIVE Sensitive     * >=100,000 COLONIES/mL ESCHERICHIA COLI    Radiology Reports Dg Chest 2 View  Result Date: 06/20/2016 CLINICAL DATA:  Short of breath chest pain EXAM: CHEST  2 VIEW COMPARISON:  03/05/2016 FINDINGS: Postop CABG and cardiac loop recorder. Negative for heart failure. Heart size within normal limits. Lungs are  clear without infiltrate effusion or mass. Calcified granuloma in the lingula is unchanged. IMPRESSION: No active cardiopulmonary disease. Electronically Signed   By: Franchot Gallo M.D.   On: 06/20/2016 18:03   Ct Head Wo Contrast  Result Date: 06/20/2016 CLINICAL DATA:  Confusion, shortness of breath. History of hypertension, diabetes. EXAM: CT HEAD WITHOUT CONTRAST TECHNIQUE: Contiguous axial images were obtained from the base of the skull through the vertex without intravenous contrast. COMPARISON:  CT HEAD January 28, 2016 FINDINGS: BRAIN: No intraparenchymal hemorrhage, mass effect nor midline shift. The ventricles and sulci are normal for age. Old bilateral basal ganglia/LEFT internal capsule and LEFT thalamus lacunar infarcts. Patchy supratentorial white matter hypodensities within normal range for patient's age, though non-specific are most compatible with chronic small vessel ischemic disease. No  acute large vascular territory infarcts. No abnormal extra-axial fluid collections. Basal cisterns are patent. VASCULAR: Moderate calcific atherosclerosis of the carotid siphons. SKULL: No skull fracture. No significant scalp soft tissue swelling. SINUSES/ORBITS: Trace paranasal sinus mucosal thickening, RIGHT concha bullosa. Mastoid air cells are well aerated. The included ocular globes and orbital contents are non-suspicious. Bilateral ocular lens implants. OTHER: Fatty replaced parotid glands. IMPRESSION: No acute intracranial process. Stable examination including moderate chronic small vessel ischemic disease and multiple lacunar infarcts. Electronically Signed   By: Elon Alas M.D.   On: 06/20/2016 18:43   Mr Brain Wo Contrast  Result Date: 06/21/2016 CLINICAL DATA:  Suspected TIA.  Episode of dizziness and aphasia. EXAM: MRI HEAD WITHOUT CONTRAST TECHNIQUE: Multiplanar, multiecho pulse sequences of the brain and surrounding structures were obtained without intravenous contrast. COMPARISON:   06/20/2016 head CT and 12/05/2015 MRI FINDINGS: Brain: There is no evidence of acute infarct, intracranial hemorrhage, mass, midline shift, or extra-axial fluid collection. There is mild generalized cerebral and cerebellar atrophy. A chronic lacunar infarct in the left internal capsule is unchanged from the prior MRI, as are smaller foci of T2 hyperintensity in the basal ganglia bilaterally which may reflect chronic lacunar infarcts and/or dilated perivascular spaces. Patchy T2 hyperintensities throughout the subcortical and deep cerebral white matter bilaterally are unchanged from the prior MRI and nonspecific but compatible with mild-to-moderate chronic small vessel ischemic disease. Vascular: Major intracranial vascular flow voids are preserved. Skull and upper cervical spine: Unremarkable bone marrow signal. Sinuses/Orbits: Bilateral cataract extraction. Paranasal sinuses and mastoid air cells are clear. Other: None. IMPRESSION: 1. No acute intracranial abnormality. 2. Mild-to-moderate chronic small vessel ischemic disease. Electronically Signed   By: Logan Bores M.D.   On: 06/21/2016 09:48     CBC  Recent Labs Lab 06/20/16 1827 06/23/16 0340  WBC 7.4 6.6  HGB 12.8 13.1  HCT 39.1 39.6  PLT 186 174  MCV 81.3 80.2  MCH 26.6 26.5  MCHC 32.7 33.1  RDW 15.5 15.5    Chemistries   Recent Labs Lab 06/20/16 1827 06/23/16 0340  NA 136 138  K 3.6 3.8  CL 104 102  CO2 20* 26  GLUCOSE 174* 158*  BUN 12 8  CREATININE 1.20* 0.92  CALCIUM 8.5* 9.1  AST 31  --   ALT 12*  --   ALKPHOS 57  --   BILITOT 0.6  --    ------------------------------------------------------------------------------------------------------------------ estimated creatinine clearance is 45 mL/min (by C-G formula based on SCr of 0.92 mg/dL). ------------------------------------------------------------------------------------------------------------------  Recent Labs  06/21/16 0514  HGBA1C 7.2*    ------------------------------------------------------------------------------------------------------------------  Recent Labs  06/21/16 0514  CHOL 192  HDL 27*  LDLCALC UNABLE TO CALCULATE IF TRIGLYCERIDE OVER 400 mg/dL  TRIG 408*  CHOLHDL 7.1   ------------------------------------------------------------------------------------------------------------------  Recent Labs  06/20/16 1827  TSH 12.873*   ------------------------------------------------------------------------------------------------------------------ No results for input(s): VITAMINB12, FOLATE, FERRITIN, TIBC, IRON, RETICCTPCT in the last 72 hours.  Coagulation profile No results for input(s): INR, PROTIME in the last 168 hours.  No results for input(s): DDIMER in the last 72 hours.  Cardiac Enzymes No results for input(s): CKMB, TROPONINI, MYOGLOBIN in the last 168 hours.  Invalid input(s): CK ------------------------------------------------------------------------------------------------------------------ Invalid input(s): POCBNP   CBG:  Recent Labs Lab 06/22/16 0645 06/22/16 1113 06/22/16 1639 06/22/16 2221 06/23/16 0648  GLUCAP 186* 182* 154* 200* 165*       Studies: No results found.    Lab Results  Component Value Date   HGBA1C 7.2 (H) 06/21/2016  HGBA1C 11.0 (H) 03/06/2015   HGBA1C 13.0 11/11/2014   Lab Results  Component Value Date   MICROALBUR neg 11/11/2014   LDLCALC UNABLE TO CALCULATE IF TRIGLYCERIDE OVER 400 mg/dL 06/21/2016   CREATININE 0.92 06/23/2016       Scheduled Meds: . aspirin  325 mg Oral Daily  . cephALEXin  500 mg Oral Q12H  . enoxaparin (LOVENOX) injection  40 mg Subcutaneous Q24H  . insulin aspart  0-5 Units Subcutaneous QHS  . insulin aspart  0-9 Units Subcutaneous TID WC  . levothyroxine  112 mcg Oral QAC breakfast  . pantoprazole  40 mg Oral Daily  . rosuvastatin  20 mg Oral Daily  . traZODone  100-200 mg Oral QHS   Continuous  Infusions: . sodium chloride 75 mL/hr at 06/20/16 2222     LOS: 2 days    Time spent: >30 MINS    [facility]  [facility] Pager [phone]. If 7PM-7AM, please contact night-coverage at [email or web address], password [facility] 06/23/2016, 9:50 AM  LOS: 2 days

## 2016-06-24 LAB — LIPID PANEL
CHOL/HDL RATIO: 5 ratio
CHOLESTEROL: 161 mg/dL (ref 0–200)
HDL: 32 mg/dL — ABNORMAL LOW (ref >40)
LDL Cholesterol: 80 mg/dL (ref 0–99)
TRIGLYCERIDES: 245 mg/dL — AB (ref <150)
VLDL: 49 mg/dL — AB (ref 0–40)

## 2016-06-24 LAB — GLUCOSE, CAPILLARY
Glucose-Capillary: 171 mg/dL — ABNORMAL HIGH (ref 65–99)
Glucose-Capillary: 167 mg/dL — ABNORMAL HIGH (ref 65–99)

## 2016-06-24 MED ORDER — SENNOSIDES-DOCUSATE SODIUM 8.6-50 MG PO TABS: 1.0000 | ORAL_TABLET | Freq: Every evening | ORAL | 0 refills | Status: DC | PRN

## 2016-06-24 MED ORDER — ROSUVASTATIN CALCIUM 20 MG PO TABS: 20.0000 mg | ORAL_TABLET | Freq: Every day | ORAL | 0 refills | Status: DC

## 2016-06-24 MED ORDER — LEVOTHYROXINE SODIUM 112 MCG PO TABS: 112.0000 ug | ORAL_TABLET | Freq: Every day | ORAL | 1 refills | Status: DC

## 2016-06-24 MED ORDER — PROPRANOLOL HCL 20 MG PO TABS: 20.0000 mg | ORAL_TABLET | Freq: Two times a day (BID) | ORAL | 0 refills | Status: DC

## 2016-06-24 MED ORDER — PANTOPRAZOLE SODIUM 40 MG PO TBEC
40.0000 mg | DELAYED_RELEASE_TABLET | Freq: Every day | ORAL | 1 refills | Status: DC
Start: 2016-06-25 — End: 2016-08-22

## 2016-06-24 MED ORDER — CEPHALEXIN 500 MG PO CAPS: 500.0000 mg | ORAL_CAPSULE | Freq: Two times a day (BID) | ORAL | 0 refills | Status: AC

## 2016-06-24 NOTE — Progress Notes (Signed)
Pt is medically stable for D/C to [facility].  CSW will call and arrange EMS for transport.  Per admissions coordinator at [facility] pt will go to a room on [place].  CSW sent D/C orders to [facility] (admissions person) via the hub.  CSW contacted/spoke to the pt's daughter Jenelle Mages and they are aware of the D/C plan.  Pt and pt's daughter are in agreement with the plan.  Please reconsult with CSW in future if needed.   Alphonse Guild. Lovely Kerins, LCSWA, LCAS

## 2016-06-24 NOTE — Progress Notes (Signed)
[facility] PROGRESS NOTE  Leah Levine IWP:[id number] DOB: 1933-01-08 DOA: 06/20/2016   PCP: Kenn File, MD     Assessment/Plan: Principal Problem:   Expressive aphasia Active Problems:   GERD (gastroesophageal reflux disease)   Hyperlipidemia   Type 2 diabetes mellitus with renal manifestations (HCC)   S/P CABG x 4   Hypothyroidism   CKD stage 3 due to type 2 diabetes mellitus ([place])   TIA (transient ischemic attack)   Aphasia    83 y.o. female with medical history significant of HTN, HLD, CAD s/p CABG, hypothyroidism, CKD stage III, and CVA; who presents after having inability to speak. History is obtained from the patient daughter who is present at bedside as well as the patient. The patient and her 2 daughters had just finished eating lunch around 2:45- 3:15PM when patient complained of feeling dizzy/lightheaded with a funny sensation in her head and face.Initial workup revealed urinalysis suggestive of infection, TSH 12.873, FT4 1.14, and all other labs are relatively within normal limits. CT scan of the brain showed no acute abnormalities. Patient admitted for stroke workup   Assessment/Plan Expressive aphasia , probable TIA  suspected TIA vs. CVA:  Without recurrence.  Telemetry shows normal sinus rhythm MRI brain  No acute intracranial abnormality.Confusion in unsteadiness in the setting of urinary tract infection and very likely dementia Neuro checks have improved after treatment of UTI PT OT recommended SNF, likely discharge 4/2 2-D echo shows EF of 50-53%, grade 2 diastolic dysfunction Hemoglobin A1c 7.2 Lipid panel-triglycerides 408, LDL unmeasurable-patient started on Crestor, repeat lipid panel shows triglycerides 245, LDL 80  Will continue ASA 325 mg a day      Urinary tract infection patient initially presents with complaints of urinary frequency. Urinalysis positive for signs of infection. Urine culture positive for Escherichia  coli , changed Rocephin to Keflex and continue for 4 more days    Diabetes mellitus type 2-stable - Hypoglycemic protocol - Hold metformin. Hemoglobin A1c 7.2 Continue SSI  Essential hypertension Stable Hold propranolol, but now  CAD s/p CABG/history of previous CVA without significant residual deficits - Continue aspirin  Hypothyroidism: TSH was 12.86 and free T4 was 1.14.Increased dose of levothyroxine from 100 g to 112 g  Chronic kidney disease stage III: Stable. Stable   GERD  Continue PPI   DVT prophylaxsis Lovenox  Code Status:  Full code   Family Communication: Discussed in detail with the patient, all imaging results, lab results explained to the patient   Disposition Plan:   SNF 4/2     Consultants:  Neurology  Procedures:  None  Antibiotics: Anti-infectives    Start     Dose/Rate Route Frequency Ordered Stop   06/23/16 1000  cephALEXin (KEFLEX) capsule 500 mg     500 mg Oral Every 12 hours 06/23/16 0948 06/28/16 0959   06/21/16 2000  cefTRIAXone (ROCEPHIN) 1 g in dextrose 5 % 50 mL IVPB  Status:  Discontinued     1 g 100 mL/hr over 30 Minutes Intravenous Every 24 hours 06/20/16 2314 06/23/16 0948   06/20/16 2045  cefTRIAXone (ROCEPHIN) 1 g in dextrose 5 % 50 mL IVPB     1 g 100 mL/hr over 30 Minutes Intravenous  Once 06/20/16 2033 06/20/16 2139         HPI/Subjective: Awake and alert , eating her lunch  Objective: Vitals:   06/23/16 2057 06/24/16 0130 06/24/16 0639 06/24/16 0953  BP: (!) 152/74 (!) 126/56 (!) 145/56 Marland Kitchen)  154/71  Pulse: 68 74 83 70  Resp: 16 16 18 18   Temp: 98.2 F (36.8 C) 98.6 F (37 C) 98.5 F (36.9 C) 97.8 F (36.6 C)  TempSrc: Oral Axillary Oral Oral  SpO2: 97% 96% 94% 97%  Weight:      Height:        Intake/Output Summary (Last 24 hours) at 06/24/16 1020 Last data filed at 06/23/16 1150  Gross per 24 hour  Intake              120 ml  Output                0 ml  Net              120 ml     Exam:  Examination:  General exam: Appears calm and comfortable  Respiratory system: Clear to auscultation. Respiratory effort normal. Cardiovascular system: S1 & S2 heard, RRR. No JVD, murmurs, rubs, gallops or clicks. No pedal edema. Gastrointestinal system: Abdomen is nondistended, soft and nontender. No organomegaly or masses felt. Normal bowel sounds heard. Central nervous system: No focal neurological deficits. Extremities: Symmetric 5 x 5 power. Skin: No rashes, lesions or ulcers Psychiatry: Judgement and insight  slightly impaired    Data Reviewed: I have personally reviewed following labs and imaging studies  Micro Results Recent Results (from the past 240 hour(s))  Urine culture     Status: Abnormal   Collection Time: 06/20/16  8:34 PM  Result Value Ref Range Status   Specimen Description URINE, RANDOM  Final   Special Requests NONE  Final   Culture >=100,000 COLONIES/mL ESCHERICHIA COLI (A)  Final   Report Status 06/23/2016 FINAL  Final   Organism ID, Bacteria ESCHERICHIA COLI (A)  Final      Susceptibility   Escherichia coli - MIC*    AMPICILLIN <=2 SENSITIVE Sensitive     CEFAZOLIN <=4 SENSITIVE Sensitive     CEFTRIAXONE <=1 SENSITIVE Sensitive     CIPROFLOXACIN <=0.25 SENSITIVE Sensitive     GENTAMICIN <=1 SENSITIVE Sensitive     IMIPENEM <=0.25 SENSITIVE Sensitive     NITROFURANTOIN <=16 SENSITIVE Sensitive     TRIMETH/SULFA <=20 SENSITIVE Sensitive     AMPICILLIN/SULBACTAM <=2 SENSITIVE Sensitive     PIP/TAZO <=4 SENSITIVE Sensitive     Extended ESBL NEGATIVE Sensitive     * >=100,000 COLONIES/mL ESCHERICHIA COLI    Radiology Reports Dg Chest 2 View  Result Date: 06/20/2016 CLINICAL DATA:  Short of breath chest pain EXAM: CHEST  2 VIEW COMPARISON:  03/05/2016 FINDINGS: Postop CABG and cardiac loop recorder. Negative for heart failure. Heart size within normal limits. Lungs are clear without infiltrate effusion or mass. Calcified granuloma in the  lingula is unchanged. IMPRESSION: No active cardiopulmonary disease. Electronically Signed   By: Franchot Gallo M.D.   On: 06/20/2016 18:03   Ct Head Wo Contrast  Result Date: 06/20/2016 CLINICAL DATA:  Confusion, shortness of breath. History of hypertension, diabetes. EXAM: CT HEAD WITHOUT CONTRAST TECHNIQUE: Contiguous axial images were obtained from the base of the skull through the vertex without intravenous contrast. COMPARISON:  CT HEAD January 28, 2016 FINDINGS: BRAIN: No intraparenchymal hemorrhage, mass effect nor midline shift. The ventricles and sulci are normal for age. Old bilateral basal ganglia/LEFT internal capsule and LEFT thalamus lacunar infarcts. Patchy supratentorial white matter hypodensities within normal range for patient's age, though non-specific are most compatible with chronic small vessel ischemic disease. No acute large vascular territory infarcts. No  abnormal extra-axial fluid collections. Basal cisterns are patent. VASCULAR: Moderate calcific atherosclerosis of the carotid siphons. SKULL: No skull fracture. No significant scalp soft tissue swelling. SINUSES/ORBITS: Trace paranasal sinus mucosal thickening, RIGHT concha bullosa. Mastoid air cells are well aerated. The included ocular globes and orbital contents are non-suspicious. Bilateral ocular lens implants. OTHER: Fatty replaced parotid glands. IMPRESSION: No acute intracranial process. Stable examination including moderate chronic small vessel ischemic disease and multiple lacunar infarcts. Electronically Signed   By: Elon Alas M.D.   On: 06/20/2016 18:43   Mr Brain Wo Contrast  Result Date: 06/21/2016 CLINICAL DATA:  Suspected TIA.  Episode of dizziness and aphasia. EXAM: MRI HEAD WITHOUT CONTRAST TECHNIQUE: Multiplanar, multiecho pulse sequences of the brain and surrounding structures were obtained without intravenous contrast. COMPARISON:  06/20/2016 head CT and 12/05/2015 MRI FINDINGS: Brain: There is no  evidence of acute infarct, intracranial hemorrhage, mass, midline shift, or extra-axial fluid collection. There is mild generalized cerebral and cerebellar atrophy. A chronic lacunar infarct in the left internal capsule is unchanged from the prior MRI, as are smaller foci of T2 hyperintensity in the basal ganglia bilaterally which may reflect chronic lacunar infarcts and/or dilated perivascular spaces. Patchy T2 hyperintensities throughout the subcortical and deep cerebral white matter bilaterally are unchanged from the prior MRI and nonspecific but compatible with mild-to-moderate chronic small vessel ischemic disease. Vascular: Major intracranial vascular flow voids are preserved. Skull and upper cervical spine: Unremarkable bone marrow signal. Sinuses/Orbits: Bilateral cataract extraction. Paranasal sinuses and mastoid air cells are clear. Other: None. IMPRESSION: 1. No acute intracranial abnormality. 2. Mild-to-moderate chronic small vessel ischemic disease. Electronically Signed   By: Logan Bores M.D.   On: 06/21/2016 09:48     CBC  Recent Labs Lab 06/20/16 1827 06/23/16 0340  WBC 7.4 6.6  HGB 12.8 13.1  HCT 39.1 39.6  PLT 186 174  MCV 81.3 80.2  MCH 26.6 26.5  MCHC 32.7 33.1  RDW 15.5 15.5    Chemistries   Recent Labs Lab 06/20/16 1827 06/23/16 0340  NA 136 138  K 3.6 3.8  CL 104 102  CO2 20* 26  GLUCOSE 174* 158*  BUN 12 8  CREATININE 1.20* 0.92  CALCIUM 8.5* 9.1  AST 31  --   ALT 12*  --   ALKPHOS 57  --   BILITOT 0.6  --    ------------------------------------------------------------------------------------------------------------------ estimated creatinine clearance is 45 mL/min (by C-G formula based on SCr of 0.92 mg/dL). ------------------------------------------------------------------------------------------------------------------ No results for input(s): HGBA1C in the last 72  hours. ------------------------------------------------------------------------------------------------------------------  Recent Labs  06/24/16 0333  CHOL 161  HDL 32*  LDLCALC 80  TRIG 245*  CHOLHDL 5.0   ------------------------------------------------------------------------------------------------------------------ No results for input(s): TSH, T4TOTAL, T3FREE, THYROIDAB in the last 72 hours.  Invalid input(s): FREET3 ------------------------------------------------------------------------------------------------------------------ No results for input(s): VITAMINB12, FOLATE, FERRITIN, TIBC, IRON, RETICCTPCT in the last 72 hours.  Coagulation profile No results for input(s): INR, PROTIME in the last 168 hours.  No results for input(s): DDIMER in the last 72 hours.  Cardiac Enzymes No results for input(s): CKMB, TROPONINI, MYOGLOBIN in the last 168 hours.  Invalid input(s): CK ------------------------------------------------------------------------------------------------------------------ Invalid input(s): POCBNP   CBG:  Recent Labs Lab 06/23/16 0648 06/23/16 1155 06/23/16 1656 06/23/16 2144 06/24/16 0638  GLUCAP 165* 189* 238* 163* 171*       Studies: No results found.    Lab Results  Component Value Date   HGBA1C 7.2 (H) 06/21/2016   HGBA1C 11.0 (H) 03/06/2015   HGBA1C  13.0 11/11/2014   Lab Results  Component Value Date   MICROALBUR neg 11/11/2014   LDLCALC 80 06/24/2016   CREATININE 0.92 06/23/2016       Scheduled Meds: . aspirin  325 mg Oral Daily  . cephALEXin  500 mg Oral Q12H  . enoxaparin (LOVENOX) injection  40 mg Subcutaneous Q24H  . insulin aspart  0-5 Units Subcutaneous QHS  . insulin aspart  0-9 Units Subcutaneous TID WC  . levothyroxine  112 mcg Oral QAC breakfast  . pantoprazole  40 mg Oral Daily  . rosuvastatin  20 mg Oral Daily  . traZODone  100-200 mg Oral QHS   Continuous Infusions: . sodium chloride 75 mL/hr at  06/20/16 2222     LOS: 3 days    Time spent: >30 MINS    [facility]  [facility] Pager [phone]. If 7PM-7AM, please contact night-coverage at [email or web address], password [facility] 06/24/2016, 10:20 AM  LOS: 3 days

## 2016-06-24 NOTE — Discharge Summary (Signed)
Physician Discharge Summary  Leah Levine MRN: [id number] DOB/AGE: 81-Jul-1934 81 y.o.  PCP: Kevin Fenton, MD   Admit date: 06/20/2016 Discharge date: 06/24/2016  Discharge Diagnoses:    Principal Problem:   Expressive aphasia Active Problems:   GERD (gastroesophageal reflux disease)   Hyperlipidemia   Type 2 diabetes mellitus with renal manifestations (HCC)   S/P CABG x 4   Hypothyroidism   CKD stage 3 due to type 2 diabetes mellitus (HCC)   TIA (transient ischemic attack)   Aphasia    Follow-up recommendations Follow-up with PCP in 3-5 days , including all  additional recommended appointments as below Follow-up CBC, CMP in 3-5 days       Current Discharge Medication List    START taking these medications   Details  cephALEXin (KEFLEX) 500 MG capsule Take 1 capsule (500 mg total) by mouth 2 (two) times daily. Qty: 10 capsule, Refills: 0    pantoprazole (PROTONIX) 40 MG tablet Take 1 tablet (40 mg total) by mouth daily. Qty: 30 tablet, Refills: 1    rosuvastatin (CRESTOR) 20 MG tablet Take 1 tablet (20 mg total) by mouth daily. Qty: 30 tablet, Refills: 0    senna-docusate (SENOKOT-S) 8.6-50 MG tablet Take 1 tablet by mouth at bedtime as needed for mild constipation. Qty: 30 tablet, Refills: 0      CONTINUE these medications which have CHANGED   Details  levothyroxine (SYNTHROID, LEVOTHROID) 112 MCG tablet Take 1 tablet (112 mcg total) by mouth daily before breakfast. Qty: 30 tablet, Refills: 1    propranolol (INDERAL) 20 MG tablet Take 1 tablet (20 mg total) by mouth 2 (two) times daily. Qty: 60 tablet, Refills: 0      CONTINUE these medications which have NOT CHANGED   Details  aspirin EC 325 MG EC tablet Take 1 tablet (325 mg total) by mouth daily. Qty: 30 tablet, Refills: 0    metFORMIN (GLUCOPHAGE) 1000 MG tablet TAKE  (1)  TABLET TWICE A DAY WITH MEALS (BREAKFAST AND SUPPER) Qty: 60 tablet, Refills: 3    traMADol (ULTRAM) 50 MG tablet  Take 50 mg by mouth every 6 (six) hours as needed for moderate pain or severe pain.    traZODone (DESYREL) 100 MG tablet Take 1-2 tablets (100-200 mg total) by mouth at bedtime. Qty: 60 tablet, Refills: 3      STOP taking these medications     clindamycin (CLEOCIN) 300 MG capsule      omeprazole (PRILOSEC) 20 MG capsule          Discharge Condition: stable  Discharge Instructions Get Medicines reviewed and adjusted: Please take all your medications with you for your next visit with your Primary MD  Please request your Primary MD to go over all hospital tests and procedure/radiological results at the follow up, please ask your Primary MD to get all Hospital records sent to his/her office.  If you experience worsening of your admission symptoms, develop shortness of breath, life threatening emergency, suicidal or homicidal thoughts you must seek medical attention immediately by calling 911 or calling your MD immediately if symptoms less severe.  You must read complete instructions/literature along with all the possible adverse reactions/side effects for all the Medicines you take and that have been prescribed to you. Take any new Medicines after you have completely understood and accpet all the possible adverse reactions/side effects.   Do not drive when taking Pain medications.   Do not take more than prescribed Pain, Sleep and Anxiety Medications  Special Instructions: If you have smoked or chewed Tobacco in the last 2 yrs please stop smoking, stop any regular Alcohol and or any Recreational drug use.  Wear Seat belts while driving.  Please note  You were cared for by a hospitalist during your hospital stay. Once you are discharged, your primary care physician will handle any further medical issues. Please note that NO REFILLS for any discharge medications will be authorized once you are discharged, as it is imperative that you return to your primary care physician (or  establish a relationship with a primary care physician if you do not have one) for your aftercare needs so that they can reassess your need for medications and monitor your lab values.  Discharge Instructions    Diet - low sodium heart healthy    Complete by:  As directed    Increase activity slowly    Complete by:  As directed        Allergies  Allergen Reactions  . Celebrex [Celecoxib] Other (See Comments)    Muscle aches and leg pains  . Fenofibrate Other (See Comments)    Muscle aches and leg pains  . Statins Other (See Comments)    Myalgias; muscle aches and leg pains (Atoravastatin, Simvastatin) - daugther does not recall Crestor trial, williing to try 06/21/16      Disposition: SNF   Consults: *     Significant Diagnostic Studies:  Dg Chest 2 View  Result Date: 06/20/2016 CLINICAL DATA:  Short of breath chest pain EXAM: CHEST  2 VIEW COMPARISON:  03/05/2016 FINDINGS: Postop CABG and cardiac loop recorder. Negative for heart failure. Heart size within normal limits. Lungs are clear without infiltrate effusion or mass. Calcified granuloma in the lingula is unchanged. IMPRESSION: No active cardiopulmonary disease. Electronically Signed   By: Marlan Palau M.D.   On: 06/20/2016 18:03   Ct Head Wo Contrast  Result Date: 06/20/2016 CLINICAL DATA:  Confusion, shortness of breath. History of hypertension, diabetes. EXAM: CT HEAD WITHOUT CONTRAST TECHNIQUE: Contiguous axial images were obtained from the base of the skull through the vertex without intravenous contrast. COMPARISON:  CT HEAD January 28, 2016 FINDINGS: BRAIN: No intraparenchymal hemorrhage, mass effect nor midline shift. The ventricles and sulci are normal for age. Old bilateral basal ganglia/LEFT internal capsule and LEFT thalamus lacunar infarcts. Patchy supratentorial white matter hypodensities within normal range for patient's age, though non-specific are most compatible with chronic small vessel ischemic  disease. No acute large vascular territory infarcts. No abnormal extra-axial fluid collections. Basal cisterns are patent. VASCULAR: Moderate calcific atherosclerosis of the carotid siphons. SKULL: No skull fracture. No significant scalp soft tissue swelling. SINUSES/ORBITS: Trace paranasal sinus mucosal thickening, RIGHT concha bullosa. Mastoid air cells are well aerated. The included ocular globes and orbital contents are non-suspicious. Bilateral ocular lens implants. OTHER: Fatty replaced parotid glands. IMPRESSION: No acute intracranial process. Stable examination including moderate chronic small vessel ischemic disease and multiple lacunar infarcts. Electronically Signed   By: Awilda Metro M.D.   On: 06/20/2016 18:43   Mr Brain Wo Contrast  Result Date: 06/21/2016 CLINICAL DATA:  Suspected TIA.  Episode of dizziness and aphasia. EXAM: MRI HEAD WITHOUT CONTRAST TECHNIQUE: Multiplanar, multiecho pulse sequences of the brain and surrounding structures were obtained without intravenous contrast. COMPARISON:  06/20/2016 head CT and 12/05/2015 MRI FINDINGS: Brain: There is no evidence of acute infarct, intracranial hemorrhage, mass, midline shift, or extra-axial fluid collection. There is mild generalized cerebral and cerebellar atrophy. A chronic lacunar infarct  in the left internal capsule is unchanged from the prior MRI, as are smaller foci of T2 hyperintensity in the basal ganglia bilaterally which may reflect chronic lacunar infarcts and/or dilated perivascular spaces. Patchy T2 hyperintensities throughout the subcortical and deep cerebral white matter bilaterally are unchanged from the prior MRI and nonspecific but compatible with mild-to-moderate chronic small vessel ischemic disease. Vascular: Major intracranial vascular flow voids are preserved. Skull and upper cervical spine: Unremarkable bone marrow signal. Sinuses/Orbits: Bilateral cataract extraction. Paranasal sinuses and mastoid air cells are  clear. Other: None. IMPRESSION: 1. No acute intracranial abnormality. 2. Mild-to-moderate chronic small vessel ischemic disease. Electronically Signed   By: Sebastian Ache M.D.   On: 06/21/2016 09:48    echocardiogram  LV EF: 60% -   65%  ------------------------------------------------------------------- Indications:      TIA 435.9.  ------------------------------------------------------------------- History:   PMH:   Coronary artery disease.  Transient ischemic attack.  Risk factors:  Hypertension. Diabetes mellitus.  ------------------------------------------------------------------- Study Conclusions  - Left ventricle: The cavity size was normal. Wall thickness was   normal. Systolic function was normal. The estimated ejection   fraction was in the range of 60% to 65%. Wall motion was normal;   there were no regional wall motion abnormalities. Features are   consistent with a pseudonormal left ventricular filling pattern,   with concomitant abnormal relaxation and increased filling   pressure (grade 2 diastolic dysfunction).    Filed Weights   06/20/16 2200  Weight: 80.2 kg (176 lb 14.4 oz)     Microbiology: Recent Results (from the past 240 hour(s))  Urine culture     Status: Abnormal   Collection Time: 06/20/16  8:34 PM  Result Value Ref Range Status   Specimen Description URINE, RANDOM  Final   Special Requests NONE  Final   Culture >=100,000 COLONIES/mL ESCHERICHIA COLI (A)  Final   Report Status 06/23/2016 FINAL  Final   Organism ID, Bacteria ESCHERICHIA COLI (A)  Final      Susceptibility   Escherichia coli - MIC*    AMPICILLIN <=2 SENSITIVE Sensitive     CEFAZOLIN <=4 SENSITIVE Sensitive     CEFTRIAXONE <=1 SENSITIVE Sensitive     CIPROFLOXACIN <=0.25 SENSITIVE Sensitive     GENTAMICIN <=1 SENSITIVE Sensitive     IMIPENEM <=0.25 SENSITIVE Sensitive     NITROFURANTOIN <=16 SENSITIVE Sensitive     TRIMETH/SULFA <=20 SENSITIVE Sensitive      AMPICILLIN/SULBACTAM <=2 SENSITIVE Sensitive     PIP/TAZO <=4 SENSITIVE Sensitive     Extended ESBL NEGATIVE Sensitive     * >=100,000 COLONIES/mL ESCHERICHIA COLI       Blood Culture    Component Value Date/Time   SDES URINE, RANDOM 06/20/2016 2034   SPECREQUEST NONE 06/20/2016 2034   CULT >=100,000 COLONIES/mL ESCHERICHIA COLI (A) 06/20/2016 2034   REPTSTATUS 06/23/2016 FINAL 06/20/2016 2034      Labs: Results for orders placed or performed during the hospital encounter of 06/20/16 (from the past 48 hour(s))  Glucose, capillary     Status: Abnormal   Collection Time: 06/22/16  4:39 PM  Result Value Ref Range   Glucose-Capillary 154 (H) 65 - 99 mg/dL  Glucose, capillary     Status: Abnormal   Collection Time: 06/22/16 10:21 PM  Result Value Ref Range   Glucose-Capillary 200 (H) 65 - 99 mg/dL   Comment 1 Notify RN    Comment 2 Document in Chart   Basic metabolic panel     Status: Abnormal  Collection Time: 06/23/16  3:40 AM  Result Value Ref Range   Sodium 138 135 - 145 mmol/L   Potassium 3.8 3.5 - 5.1 mmol/L   Chloride 102 101 - 111 mmol/L   CO2 26 22 - 32 mmol/L   Glucose, Bld 158 (H) 65 - 99 mg/dL   BUN 8 6 - 20 mg/dL   Creatinine, Ser 3.47 0.44 - 1.00 mg/dL   Calcium 9.1 8.9 - 42.5 mg/dL   GFR calc non Af Amer 57 (L) >60 mL/min   GFR calc Af Amer >60 >60 mL/min    Comment: (NOTE) The eGFR has been calculated using the CKD EPI equation. This calculation has not been validated in all clinical situations. eGFR's persistently <60 mL/min signify possible Chronic Kidney Disease.    Anion gap 10 5 - 15  CBC     Status: None   Collection Time: 06/23/16  3:40 AM  Result Value Ref Range   WBC 6.6 4.0 - 10.5 K/uL   RBC 4.94 3.87 - 5.11 MIL/uL   Hemoglobin 13.1 12.0 - 15.0 g/dL   HCT 95.6 38.7 - 56.4 %   MCV 80.2 78.0 - 100.0 fL   MCH 26.5 26.0 - 34.0 pg   MCHC 33.1 30.0 - 36.0 g/dL   RDW 33.2 95.1 - 88.4 %   Platelets 174 150 - 400 K/uL  Glucose, capillary      Status: Abnormal   Collection Time: 06/23/16  6:48 AM  Result Value Ref Range   Glucose-Capillary 165 (H) 65 - 99 mg/dL   Comment 1 Notify RN    Comment 2 Document in Chart   Glucose, capillary     Status: Abnormal   Collection Time: 06/23/16 11:55 AM  Result Value Ref Range   Glucose-Capillary 189 (H) 65 - 99 mg/dL  Glucose, capillary     Status: Abnormal   Collection Time: 06/23/16  4:56 PM  Result Value Ref Range   Glucose-Capillary 238 (H) 65 - 99 mg/dL   Comment 1 Notify RN   Glucose, capillary     Status: Abnormal   Collection Time: 06/23/16  9:44 PM  Result Value Ref Range   Glucose-Capillary 163 (H) 65 - 99 mg/dL   Comment 1 Notify RN    Comment 2 Document in Chart   Lipid panel     Status: Abnormal   Collection Time: 06/24/16  3:33 AM  Result Value Ref Range   Cholesterol 161 0 - 200 mg/dL   Triglycerides 166 (H) <150 mg/dL   HDL 32 (L) >06 mg/dL   Total CHOL/HDL Ratio 5.0 RATIO   VLDL 49 (H) 0 - 40 mg/dL   LDL Cholesterol 80 0 - 99 mg/dL    Comment:        Total Cholesterol/HDL:CHD Risk Coronary Heart Disease Risk Table                     Men   Women  1/2 Average Risk   3.4   3.3  Average Risk       5.0   4.4  2 X Average Risk   9.6   7.1  3 X Average Risk  23.4   11.0        Use the calculated Patient Ratio above and the CHD Risk Table to determine the patient's CHD Risk.        ATP III CLASSIFICATION (LDL):  <100     mg/dL   Optimal  301-601  mg/dL  Near or Above                    Optimal  130-159  mg/dL   Borderline  132-440  mg/dL   High  >102     mg/dL   Very High   Glucose, capillary     Status: Abnormal   Collection Time: 06/24/16  6:38 AM  Result Value Ref Range   Glucose-Capillary 171 (H) 65 - 99 mg/dL   Comment 1 Notify RN    Comment 2 Document in Chart   Glucose, capillary     Status: Abnormal   Collection Time: 06/24/16 11:28 AM  Result Value Ref Range   Glucose-Capillary 167 (H) 65 - 99 mg/dL     Lipid Panel     Component  Value Date/Time   CHOL 161 06/24/2016 0333   CHOL 244 (H) 08/26/2015 1231   CHOL 230 (H) 09/03/2012 1456   TRIG 245 (H) 06/24/2016 0333   TRIG 379 (H) 06/18/2013 0911   TRIG 527 (H) 09/03/2012 1456   HDL 32 (L) 06/24/2016 0333   HDL 37 (L) 08/26/2015 1231   HDL 36 (L) 06/18/2013 0911   HDL 35 (L) 09/03/2012 1456   CHOLHDL 5.0 06/24/2016 0333   VLDL 49 (H) 06/24/2016 0333   LDLCALC 80 06/24/2016 0333   LDLCALC Comment 08/26/2015 1231   LDLCALC 58 06/18/2013 0911   LDLCALC 90 09/03/2012 1456     Lab Results  Component Value Date   HGBA1C 7.2 (H) 06/21/2016   HGBA1C 11.0 (H) 03/06/2015   HGBA1C 13.0 11/11/2014        HPI :  83 y.o.femalewith medical history significant of HTN, HLD, CAD s/p CABG, hypothyroidism, CKD stage III, and CVA; who presents after having inability to speak. History is obtained from the patient daughter who is present at bedside as well as the patient. The patient and her 2 daughters had just finished eatinglunch around 2:45- 3:15PM when patient complained of feeling dizzy/lightheaded with a funny sensation in her head and face.Initial workup revealed urinalysis suggestive of infection, TSH 12.873, FT4 1.14, and all other labs are relatively within normal limits. CT scan of the brain showed no acute abnormalities. Patient admitted for stroke workup  HOSPITAL COURSE:    Expressive aphasia , probable TIA  suspected TIA    Without recurrence.  Telemetry shows normal sinus rhythm MRI brain  No acute intracranial abnormality.Confusion in unsteadiness in the setting of urinary tract infection and very likely dementia Neuro checks have improved after treatment of UTI PT OT recommended SNF, likely discharge 4/2 2-D echo shows EF of 60-65%, grade 2 diastolic dysfunction Hemoglobin A1c 7.2 Lipid panel-triglycerides 408, LDL unmeasurable-patient started on Crestor, repeat lipid panel shows triglycerides 245, LDL 80  Will continue ASA 725 mg a day       Urinary tract infection patient initially presents with complaints of urinary frequency. Urinalysis positive for signs of infection. Urine culture positive for Escherichia coli , changed Rocephin to Keflex and continue for 4 more days    Diabetes mellitus type 2-stable Resume metformin. Hemoglobin A1c 7.2 Continue SSI  Essential hypertension Stable Resumed  Propranolol at a lower dose   CAD s/p CABG/history of previous CVA without significant residual deficits - Continue aspirin  Hypothyroidism: TSH was 12.86 and free T4 was 1.14.Increased dose of levothyroxine from 100 g to 112 g  Chronic kidney disease stage III: Stable. Stable  GERD  Continue PPI   Discharge Exam:  Blood pressure Marland Kitchen)  154/71, pulse 70, temperature 97.8 F (36.6 C), temperature source Oral, resp. rate 18, height 5' (1.524 m), weight 80.2 kg (176 lb 14.4 oz), SpO2 97 %.   General exam: Appears calm and comfortable  Respiratory system: Clear to auscultation. Respiratory effort normal. Cardiovascular system: S1 & S2 heard, RRR. No JVD, murmurs, rubs, gallops or clicks. No pedal edema. Gastrointestinal system: Abdomen is nondistended, soft and nontender. No organomegaly or masses felt. Normal bowel sounds heard. Central nervous system: No focal neurological deficits. Extremities: Symmetric 5 x 5 power. Skin: No rashes, lesions or ulcers     Signed: Wilda Wetherell 06/24/2016, 2:38 PM        Time spent >45 mins

## 2016-06-28 ENCOUNTER — Ambulatory Visit (INDEPENDENT_AMBULATORY_CARE_PROVIDER_SITE_OTHER): Payer: Medicare Other | Admitting: *Deleted

## 2016-06-28 DIAGNOSIS — R55 Syncope and collapse: Secondary | ICD-10-CM | POA: Diagnosis not present

## 2016-06-28 NOTE — Progress Notes (Signed)
[facility] Summary Report / Loop Recorder 

## 2016-06-30 LAB — CUP PACEART REMOTE DEVICE CHECK
MDC IDC PG IMPLANT DT: 20150917
MDC IDC SESS DTM: 20180306064259

## 2016-07-07 LAB — CUP PACEART REMOTE DEVICE CHECK
Implantable Pulse Generator Implant Date: 20150917
MDC IDC SESS DTM: 20180405063703

## 2016-07-07 NOTE — Progress Notes (Signed)
Carelink summary report received. Battery status OK. Normal device function. No new symptom episodes, tachy episodes, brady, or pause episodes. No new AF episodes. Monthly summary reports and ROV/PRN 

## 2016-07-30 ENCOUNTER — Ambulatory Visit (INDEPENDENT_AMBULATORY_CARE_PROVIDER_SITE_OTHER): Payer: Medicare Other | Admitting: *Deleted

## 2016-07-30 DIAGNOSIS — R55 Syncope and collapse: Secondary | ICD-10-CM | POA: Diagnosis not present

## 2016-07-31 NOTE — Progress Notes (Signed)
[facility] Summary Report / Loop Recorder 

## 2016-08-01 ENCOUNTER — Other Ambulatory Visit: Payer: Self-pay

## 2016-08-01 MED ORDER — PROPRANOLOL HCL 20 MG PO TABS: 20.0000 mg | ORAL_TABLET | Freq: Two times a day (BID) | ORAL | 1 refills | Status: DC

## 2016-08-01 MED ORDER — ROSUVASTATIN CALCIUM 20 MG PO TABS: 20.0000 mg | ORAL_TABLET | Freq: Every day | ORAL | 1 refills | Status: DC

## 2016-08-01 MED ORDER — SENNOSIDES-DOCUSATE SODIUM 8.6-50 MG PO TABS: 1.0000 | ORAL_TABLET | Freq: Every evening | ORAL | 0 refills | Status: DC | PRN

## 2016-08-02 ENCOUNTER — Other Ambulatory Visit: Payer: Self-pay | Admitting: Family Medicine

## 2016-08-02 DIAGNOSIS — E039 Hypothyroidism, unspecified: Secondary | ICD-10-CM

## 2016-08-09 ENCOUNTER — Other Ambulatory Visit: Payer: Self-pay

## 2016-08-09 ENCOUNTER — Ambulatory Visit: Payer: Medicare Other | Admitting: Family Medicine

## 2016-08-09 NOTE — Telephone Encounter (Signed)
Pt still having UTI sxs Appt made for this afternoon

## 2016-08-09 NOTE — Telephone Encounter (Signed)
Would recommend evaluation for UTI symptoms.   If unable to be seen I am ok with refilling.   Laroy Apple, MD [facility] 08/09/2016, 11:58 AM

## 2016-08-10 ENCOUNTER — Encounter: Payer: Self-pay | Admitting: Family Medicine

## 2016-08-10 ENCOUNTER — Ambulatory Visit (INDEPENDENT_AMBULATORY_CARE_PROVIDER_SITE_OTHER): Payer: Medicare Other | Admitting: Family Medicine

## 2016-08-10 VITALS — BP 131/70 | HR 67 | Temp 97.0°F | Ht 61.0 in | Wt 165.8 lb

## 2016-08-10 DIAGNOSIS — R399 Unspecified symptoms and signs involving the genitourinary system: Secondary | ICD-10-CM | POA: Diagnosis not present

## 2016-08-10 DIAGNOSIS — R1011 Right upper quadrant pain: Secondary | ICD-10-CM

## 2016-08-10 LAB — URINALYSIS, COMPLETE
Bilirubin, UA: NEGATIVE
Glucose, UA: NEGATIVE
KETONES UA: NEGATIVE
LEUKOCYTES UA: NEGATIVE
Nitrite, UA: NEGATIVE
Urobilinogen, Ur: 0.2 mg/dL (ref 0.2–1.0)
pH, UA: 5 (ref 5.0–7.5)

## 2016-08-10 LAB — MICROSCOPIC EXAMINATION
Epithelial Cells (non renal): >10 /hpf — AB (ref 0–10)
Renal Epithel, UA: NONE SEEN /hpf

## 2016-08-10 NOTE — Progress Notes (Signed)
   HPI  Patient presents today here with concern for UTI and abdominal pain.  Patient claims that she has sharp bilateral/generalized abdominal pain for about 10 minutes after eating. This begins with finishing her meal and stops after she has a defecation. She denies any bloody bowel movements, diarrhea, fever, chills, sweats. She has a history of cholecystectomy.  Concern for UTI Patient has increased urinary frequency with foul-smelling urine. She denies any dysuria. Again denies fever, chills, sweats.  PMH: Smoking status noted ROS: Per HPI  Objective: BP 131/70   Pulse 67   Temp 97 F (36.1 C) (Oral)   Ht '5\' 1"'$  (1.549 m)   Wt 165 lb 12.8 oz (75.2 kg)   BMI 31.33 kg/m  Gen: NAD, alert, cooperative with exam HEENT: NCAT CV: RRR, good S1/S2, no murmur Resp: CTABL, no wheezes, non-labored Abd: Soft, MIld TTP in RUQ, no rebound or guarding Ext: No edema, warm Neuro: Alert and oriented, No gross deficits  Assessment and plan:  # UTI symptoms No signs of UTI today, will await micral exam and urine culture Reassurance provided  # Right upper quadrant pain Patient describes generalized abdominal pain, on exam she has more right upper quadrant tenderness which is mild. Her symptoms sound similar to gastrocolic reflex with pain relieved with defecation 10 minutes after eating. Low threshold for return, call or come back for any other concerning symptoms. Labs    Orders Placed This Encounter  Procedures  . Urine culture  . Urinalysis, Complete  . BMP8+EGFR  . Hepatic function panel    Laroy Apple, MD [facility] 08/10/2016, 2:11 PM

## 2016-08-10 NOTE — Patient Instructions (Signed)
Great to see you!  We will call with labs and culture results  Please let us know if she develops any fevers, burning with urination, or other concerning symptoms.

## 2016-08-11 LAB — HEPATIC FUNCTION PANEL
ALK PHOS: 63 IU/L (ref 39–117)
ALT: 6 IU/L (ref 0–32)
AST: 12 IU/L (ref 0–40)
Albumin: 4.3 g/dL (ref 3.5–4.7)
Bilirubin Total: 0.5 mg/dL (ref 0.0–1.2)
Bilirubin, Direct: 0.15 mg/dL (ref 0.00–0.40)
TOTAL PROTEIN: 7.5 g/dL (ref 6.0–8.5)

## 2016-08-11 LAB — BMP8+EGFR
BUN / CREAT RATIO: 11 — AB (ref 12–28)
BUN: 16 mg/dL (ref 8–27)
CO2: 22 mmol/L (ref 18–29)
CREATININE: 1.45 mg/dL — AB (ref 0.57–1.00)
Calcium: 9.5 mg/dL (ref 8.7–10.3)
Chloride: 97 mmol/L (ref 96–106)
GFR calc Af Amer: 39 mL/min/{1.73_m2} — ABNORMAL LOW (ref >59)
GFR calc non Af Amer: 34 mL/min/{1.73_m2} — ABNORMAL LOW (ref >59)
GLUCOSE: 244 mg/dL — AB (ref 65–99)
Potassium: 4.2 mmol/L (ref 3.5–5.2)
SODIUM: 137 mmol/L (ref 134–144)

## 2016-08-12 LAB — URINE CULTURE

## 2016-08-13 LAB — CUP PACEART REMOTE DEVICE CHECK
Date Time Interrogation Session: 20180505073929
MDC IDC PG IMPLANT DT: 20150917

## 2016-08-14 ENCOUNTER — Other Ambulatory Visit: Payer: Self-pay | Admitting: Family Medicine

## 2016-08-14 DIAGNOSIS — N179 Acute kidney failure, unspecified: Secondary | ICD-10-CM

## 2016-08-21 ENCOUNTER — Other Ambulatory Visit: Payer: Medicare Other

## 2016-08-21 DIAGNOSIS — N179 Acute kidney failure, unspecified: Secondary | ICD-10-CM

## 2016-08-22 ENCOUNTER — Other Ambulatory Visit: Payer: Self-pay | Admitting: *Deleted

## 2016-08-22 LAB — BMP8+EGFR
BUN/Creatinine Ratio: 13 (ref 12–28)
BUN: 15 mg/dL (ref 8–27)
CALCIUM: 9.8 mg/dL (ref 8.7–10.3)
CO2: 26 mmol/L (ref 18–29)
CREATININE: 1.2 mg/dL — AB (ref 0.57–1.00)
Chloride: 102 mmol/L (ref 96–106)
GFR, EST AFRICAN AMERICAN: 49 mL/min/{1.73_m2} — AB (ref >59)
GFR, EST NON AFRICAN AMERICAN: 42 mL/min/{1.73_m2} — AB (ref >59)
Glucose: 132 mg/dL — ABNORMAL HIGH (ref 65–99)
POTASSIUM: 3.8 mmol/L (ref 3.5–5.2)
Sodium: 141 mmol/L (ref 134–144)

## 2016-08-22 MED ORDER — ROSUVASTATIN CALCIUM 20 MG PO TABS: 20.0000 mg | ORAL_TABLET | Freq: Every day | ORAL | 1 refills | Status: DC

## 2016-08-22 MED ORDER — PANTOPRAZOLE SODIUM 40 MG PO TBEC
40.0000 mg | DELAYED_RELEASE_TABLET | Freq: Every day | ORAL | 1 refills | Status: DC
Start: 2016-08-22 — End: 2016-11-27

## 2016-08-22 MED ORDER — PROPRANOLOL HCL 20 MG PO TABS: 20.0000 mg | ORAL_TABLET | Freq: Two times a day (BID) | ORAL | 1 refills | Status: DC

## 2016-08-23 ENCOUNTER — Ambulatory Visit (INDEPENDENT_AMBULATORY_CARE_PROVIDER_SITE_OTHER): Payer: Medicare Other | Admitting: Pediatrics

## 2016-08-23 DIAGNOSIS — E1122 Type 2 diabetes mellitus with diabetic chronic kidney disease: Secondary | ICD-10-CM

## 2016-08-23 DIAGNOSIS — E039 Hypothyroidism, unspecified: Secondary | ICD-10-CM

## 2016-08-23 DIAGNOSIS — N390 Urinary tract infection, site not specified: Secondary | ICD-10-CM

## 2016-08-23 DIAGNOSIS — G459 Transient cerebral ischemic attack, unspecified: Secondary | ICD-10-CM | POA: Diagnosis not present

## 2016-08-23 DIAGNOSIS — E538 Deficiency of other specified B group vitamins: Secondary | ICD-10-CM

## 2016-08-23 DIAGNOSIS — R4701 Aphasia: Secondary | ICD-10-CM | POA: Diagnosis not present

## 2016-08-23 DIAGNOSIS — I129 Hypertensive chronic kidney disease with stage 1 through stage 4 chronic kidney disease, or unspecified chronic kidney disease: Secondary | ICD-10-CM

## 2016-08-23 DIAGNOSIS — I2510 Atherosclerotic heart disease of native coronary artery without angina pectoris: Secondary | ICD-10-CM

## 2016-08-23 DIAGNOSIS — N183 Chronic kidney disease, stage 3 (moderate): Secondary | ICD-10-CM

## 2016-08-23 DIAGNOSIS — Z9181 History of falling: Secondary | ICD-10-CM

## 2016-08-27 ENCOUNTER — Encounter: Payer: Medicare Other | Admitting: *Deleted

## 2016-08-29 ENCOUNTER — Encounter: Payer: Self-pay | Admitting: Cardiology

## 2016-08-29 ENCOUNTER — Other Ambulatory Visit: Payer: Self-pay | Admitting: Family Medicine

## 2016-09-20 ENCOUNTER — Ambulatory Visit (INDEPENDENT_AMBULATORY_CARE_PROVIDER_SITE_OTHER): Payer: Medicare Other | Admitting: Family Medicine

## 2016-09-20 ENCOUNTER — Telehealth: Payer: Self-pay | Admitting: Family Medicine

## 2016-09-20 ENCOUNTER — Encounter: Payer: Self-pay | Admitting: Cardiology

## 2016-09-20 ENCOUNTER — Encounter: Payer: Self-pay | Admitting: Family Medicine

## 2016-09-20 VITALS — BP 127/73 | HR 78 | Temp 97.1°F | Ht 61.0 in | Wt 164.0 lb

## 2016-09-20 DIAGNOSIS — L304 Erythema intertrigo: Secondary | ICD-10-CM

## 2016-09-20 DIAGNOSIS — W19XXXD Unspecified fall, subsequent encounter: Secondary | ICD-10-CM

## 2016-09-20 DIAGNOSIS — I10 Essential (primary) hypertension: Secondary | ICD-10-CM

## 2016-09-20 MED ORDER — FLUCONAZOLE 150 MG PO TABS
ORAL_TABLET | ORAL | 0 refills | Status: DC
Start: 2016-09-20 — End: 2017-02-05

## 2016-09-20 MED ORDER — KETOCONAZOLE 2 % EX CREA: 1.0000 "application " | TOPICAL_CREAM | Freq: Two times a day (BID) | CUTANEOUS | 2 refills | Status: DC

## 2016-09-20 NOTE — Telephone Encounter (Signed)
Patient is taking propranolol 20 mg- fixed in office visit.

## 2016-09-20 NOTE — Addendum Note (Signed)
Addended by: Karle Plumber on: 09/20/2016 03:28 PM   Modules accepted: Orders

## 2016-09-20 NOTE — Telephone Encounter (Signed)
Did you try to call pt?

## 2016-09-20 NOTE — Telephone Encounter (Signed)
[place], MD [facility] 09/20/2016, 3:37 PM

## 2016-09-20 NOTE — Progress Notes (Signed)
   HPI  Patient presents today here for emergency room follow-up.  Patient went to the beach and had a fall in the hotel room landing hard on her left shoulder, left knee, and injuring her head. She was seen in the emergency room and evaluated with x-rays and CT head. Everything was normal. She was seen at new [facility] in [place]  PMH: Smoking status noted ROS: Per HPI  Objective: BP 127/73   Pulse 78   Temp 97.1 F (36.2 C) (Oral)   Ht 5\' 1"  (1.549 m)   Wt 164 lb (74.4 kg)   BMI 30.99 kg/m  Gen: NAD, alert, cooperative with exam HEENT: NCAT, EOMI, PERRL CV: RRR, good S1/S2, no murmur Resp: CTABL, no wheezes, non-labored Ext: No edema, warm Neuro: Alert and oriented, No gross deficits Skin:  Under right abdomen she has erythematous rash in the skin fold, some satellite lesions MSK:  Margaretha Sheffield bruises on the left knee, well-healed scar from previous knee surgery. Healing bruising on the left shoulder  Assessment and plan:  # Fall Patient with follow-up beach, she has early been worked up in the emergency room with CT and x-rays. She is doing well. She is slowly healing with the knee and shoulder pain.  # Intertrigo Moderate, treatment Diflucan 150 mg weekly 4 weeks Add ketoconazole cream Continue nystatin powder  Hypertension Doing well with propranolol only, after reviewing the medication list it is listed that she is on 2 different doses. I will ask nursing to call and clarify dosing.  Follow-up next month for diabetes    No orders of the defined types were placed in this encounter.   Meds ordered this encounter  Medications  . fluconazole (DIFLUCAN) 150 MG tablet    Sig: Once a week for 4 weeks    Dispense:  4 tablet    Refill:  0  . ketoconazole (NIZORAL) 2 % cream    Sig: Apply 1 application topically 2 (two) times daily.    Dispense:  60 g    Refill:  [place], MD [facility] 09/20/2016, 10:39 AM

## 2016-09-20 NOTE — Patient Instructions (Signed)
Great to see you!  Keep using powder, use diflucan, also try once to twice daily ketoconazole on the rash

## 2016-09-20 NOTE — Telephone Encounter (Signed)
Attempted to call back- wanting to know if she Is on propranolol 20 or 60 mg?

## 2016-09-27 ENCOUNTER — Ambulatory Visit (INDEPENDENT_AMBULATORY_CARE_PROVIDER_SITE_OTHER): Payer: Medicare Other | Admitting: *Deleted

## 2016-09-27 DIAGNOSIS — R55 Syncope and collapse: Secondary | ICD-10-CM | POA: Diagnosis not present

## 2016-09-28 NOTE — Progress Notes (Signed)
[facility] Summary Report / Loop Recorder 

## 2016-10-01 LAB — CUP PACEART REMOTE DEVICE CHECK
Date Time Interrogation Session: 20180704141124
MDC IDC PG IMPLANT DT: 20150917

## 2016-10-11 DIAGNOSIS — E1142 Type 2 diabetes mellitus with diabetic polyneuropathy: Secondary | ICD-10-CM | POA: Diagnosis not present

## 2016-10-11 DIAGNOSIS — M79676 Pain in unspecified toe(s): Secondary | ICD-10-CM | POA: Diagnosis not present

## 2016-10-11 DIAGNOSIS — L84 Corns and callosities: Secondary | ICD-10-CM | POA: Diagnosis not present

## 2016-10-11 DIAGNOSIS — B351 Tinea unguium: Secondary | ICD-10-CM | POA: Diagnosis not present

## 2016-10-25 ENCOUNTER — Encounter: Payer: Self-pay | Admitting: Pediatrics

## 2016-10-25 ENCOUNTER — Ambulatory Visit (INDEPENDENT_AMBULATORY_CARE_PROVIDER_SITE_OTHER): Payer: Medicare Other | Admitting: Pediatrics

## 2016-10-25 VITALS — BP 127/69 | HR 60 | Temp 97.6°F | Ht 61.0 in | Wt 169.8 lb

## 2016-10-25 DIAGNOSIS — R42 Dizziness and giddiness: Secondary | ICD-10-CM

## 2016-10-25 DIAGNOSIS — I7090 Unspecified atherosclerosis: Secondary | ICD-10-CM

## 2016-10-25 MED ORDER — ROSUVASTATIN CALCIUM 20 MG PO TABS: 20.0000 mg | ORAL_TABLET | Freq: Every day | ORAL | 1 refills | Status: DC

## 2016-10-25 NOTE — Progress Notes (Signed)
  Subjective:   Patient ID: Leah Levine, female    DOB: 09/12/1932, 83 y.o.   MRN: [id number] CC: Dizziness (Constant, 2 week)  HPI: Leah Levine is a 83 y.o. female presenting for Dizziness (Constant, 2 week)  Has had constant vertigo and dizziness since waking up one morning 1.5-2 weeks ago Had a headache three days last week as well, started when the dizziness started HA was all over her head, nothing made it better Hurt all the time, was not the worst HA she has had Used to have chronic migraines and those were worse H/o multiple strokes and MI Most recent TIA with expressive aphasia in 05/2016, MRI unchanged at that time, also had UTI at the time Takes '325mg'$  ASA daily, statin  Vertigo is present with eyes open, eyes closed, when sitting still Gets worse when she moves her head but is there always No fevers  No recent URI symptoms, has a runny nose all the time, denies allergies Says it has always run like that, worse with eating  Relevant past medical, surgical, family and social history reviewed. Allergies and medications reviewed and updated. History  Smoking Status  . Never Smoker  Smokeless Tobacco  . Never Used   ROS: Per HPI   Objective:    BP 127/69   Pulse 60   Temp 97.6 F (36.4 C) (Oral)   Ht '5\' 1"'$  (1.549 m)   Wt 169 lb 12.8 oz (77 kg)   BMI 32.08 kg/m   Wt Readings from Last 3 Encounters:  10/25/16 169 lb 12.8 oz (77 kg)  09/20/16 164 lb (74.4 kg)  08/10/16 165 lb 12.8 oz (75.2 kg)    Gen: NAD, alert, cooperative with exam, NCAT EYES: EOMI, no conjunctival injection, or no icterus ENT:  TMs pearly gray b/l, OP without erythema LYMPH: no cervical LAD CV: NRRR, normal S1/S2, no murmur, distal pulses 2+ b/l Resp: CTABL, no wheezes, normal WOB Abd: +BS, soft, NTND. no guarding or organomegaly Ext: No edema, warm Neuro: Alert and oriented, strength equal 5/5 b/l shoulder abduction, adduction, hand grip, knee flex/ext, ankle  ext/flex Slow finger-nose-finger Flattening of R side forehead wrinkles, unchanged from baseline Equal eye squeeze b/l Sensation intact b/l face, arms, legs to touch Shuffling gait A couple beats of nystagmus with both L and R-ward gaze  Assessment & Plan:  Leah Levine was seen today for dizziness.  Diagnoses and all orders for this visit:  Vertigo Constant x 2 weeks Will get MRI On statin, ASA '325mg'$  -     MR Brain Wo Contrast; Future -     Ambulatory referral to Neurology -     CMP14+EGFR -     CBC with Differential  Atherosclerosis -     rosuvastatin (CRESTOR) 20 MG tablet; Take 1 tablet (20 mg total) by mouth daily.   Follow up plan: Return in about 2 weeks (around 11/08/2016). Assunta Found, MD [facility]

## 2016-10-26 ENCOUNTER — Ambulatory Visit (INDEPENDENT_AMBULATORY_CARE_PROVIDER_SITE_OTHER): Payer: Medicare Other | Admitting: *Deleted

## 2016-10-26 ENCOUNTER — Ambulatory Visit (HOSPITAL_COMMUNITY)
Admission: RE | Admit: 2016-10-26 | Discharge: 2016-10-26 | Disposition: A | Discharge status: Home / Self Care | Payer: Medicare Other | Source: Ambulatory Visit | Attending: Pediatrics | Admitting: Pediatrics

## 2016-10-26 DIAGNOSIS — R270 Ataxia, unspecified: Secondary | ICD-10-CM | POA: Diagnosis not present

## 2016-10-26 DIAGNOSIS — R42 Dizziness and giddiness: Secondary | ICD-10-CM | POA: Diagnosis not present

## 2016-10-26 DIAGNOSIS — R55 Syncope and collapse: Secondary | ICD-10-CM

## 2016-10-26 LAB — CBC WITH DIFFERENTIAL/PLATELET
BASOS: 0 %
Basophils Absolute: 0 10*3/uL (ref 0.0–0.2)
EOS (ABSOLUTE): 0.2 10*3/uL (ref 0.0–0.4)
Eos: 3 %
Hematocrit: 40.9 % (ref 34.0–46.6)
Hemoglobin: 12.9 g/dL (ref 11.1–15.9)
Immature Grans (Abs): 0 10*3/uL (ref 0.0–0.1)
Immature Granulocytes: 0 %
Lymphocytes Absolute: 2.3 10*3/uL (ref 0.7–3.1)
Lymphs: 28 %
MCH: 25.4 pg — AB (ref 26.6–33.0)
MCHC: 31.5 g/dL (ref 31.5–35.7)
MCV: 81 fL (ref 79–97)
MONOS ABS: 0.4 10*3/uL (ref 0.1–0.9)
Monocytes: 5 %
NEUTROS ABS: 5 10*3/uL (ref 1.4–7.0)
Neutrophils: 64 %
Platelets: 218 10*3/uL (ref 150–379)
RBC: 5.07 x10E6/uL (ref 3.77–5.28)
RDW: 16.5 % — AB (ref 12.3–15.4)
WBC: 7.9 10*3/uL (ref 3.4–10.8)

## 2016-10-26 LAB — CMP14+EGFR
A/G RATIO: 1.4 (ref 1.2–2.2)
ALT: 10 IU/L (ref 0–32)
AST: 23 IU/L (ref 0–40)
Albumin: 4.1 g/dL (ref 3.5–4.7)
Alkaline Phosphatase: 73 IU/L (ref 39–117)
BUN / CREAT RATIO: 14 (ref 12–28)
BUN: 16 mg/dL (ref 8–27)
Bilirubin Total: 0.4 mg/dL (ref 0.0–1.2)
CO2: 25 mmol/L (ref 20–29)
Calcium: 9.5 mg/dL (ref 8.7–10.3)
Chloride: 99 mmol/L (ref 96–106)
Creatinine, Ser: 1.18 mg/dL — ABNORMAL HIGH (ref 0.57–1.00)
GFR, EST AFRICAN AMERICAN: 50 mL/min/{1.73_m2} — AB (ref >59)
GFR, EST NON AFRICAN AMERICAN: 43 mL/min/{1.73_m2} — AB (ref >59)
GLOBULIN, TOTAL: 2.9 g/dL (ref 1.5–4.5)
Glucose: 287 mg/dL — ABNORMAL HIGH (ref 65–99)
POTASSIUM: 5.3 mmol/L — AB (ref 3.5–5.2)
SODIUM: 137 mmol/L (ref 134–144)
TOTAL PROTEIN: 7 g/dL (ref 6.0–8.5)

## 2016-10-30 ENCOUNTER — Other Ambulatory Visit: Payer: Self-pay | Admitting: Family Medicine

## 2016-10-30 NOTE — Progress Notes (Signed)
[facility] Summary Report / Loop Recorder 

## 2016-11-02 ENCOUNTER — Other Ambulatory Visit: Payer: Self-pay | Admitting: Family Medicine

## 2016-11-05 ENCOUNTER — Encounter: Payer: Self-pay | Admitting: Family Medicine

## 2016-11-05 ENCOUNTER — Ambulatory Visit (INDEPENDENT_AMBULATORY_CARE_PROVIDER_SITE_OTHER): Payer: Medicare Other | Admitting: Family Medicine

## 2016-11-05 VITALS — BP 116/67 | HR 59 | Temp 97.3°F | Ht 61.0 in | Wt 165.0 lb

## 2016-11-05 DIAGNOSIS — B372 Candidiasis of skin and nail: Secondary | ICD-10-CM | POA: Diagnosis not present

## 2016-11-05 MED ORDER — TOLNAFTATE 1 % EX POWD: 1.0000 "application " | Freq: Two times a day (BID) | CUTANEOUS | 2 refills | Status: DC

## 2016-11-05 MED ORDER — FLUCONAZOLE 150 MG PO TABS: ORAL_TABLET | ORAL | 0 refills | Status: DC

## 2016-11-05 NOTE — Progress Notes (Signed)
   BP 116/67   Pulse (!) 59   Temp (!) 97.3 F (36.3 C) (Oral)   Ht 5\' 1"  (1.549 m)   Wt 165 lb (74.8 kg)   BMI 31.18 kg/m    Subjective:    Patient ID: Leah Levine, female    DOB: 10-09-1932, 83 y.o.   MRN: [id number]  HPI: Leah Levine is a 83 y.o. female presenting on 11/05/2016 for Rash (underneath fold on abdomen; completed Ketoconazole, taking Diflucan once a month)   HPI Rash/yeast Patient has a rash that is in her groin on both sides and is starting to spread. She is gotten yeast problems down there many times before and most recently she was diagnosed with that given a cream and one dose of Diflucan. She says that in the improving and may be even worsening with this. She denies any fevers or chills or redness or warmth or drainage. She has a lot of irritation and pain and pruritus from the rash site in her groin. She tends to get this every year.  Relevant past medical, surgical, family and social history reviewed and updated as indicated. Interim medical history since our last visit reviewed. Allergies and medications reviewed and updated.  Review of Systems  Constitutional: Negative for chills and fever.  Eyes: Negative for visual disturbance.  Respiratory: Negative for chest tightness and shortness of breath.   Cardiovascular: Negative for chest pain and leg swelling.  Musculoskeletal: Negative for back pain and gait problem.  Skin: Positive for color change and rash.  Neurological: Negative for light-headedness and headaches.  Psychiatric/Behavioral: Negative for agitation and behavioral problems.  All other systems reviewed and are negative.   Per HPI unless specifically indicated above        Objective:    BP 116/67   Pulse (!) 59   Temp (!) 97.3 F (36.3 C) (Oral)   Ht 5\' 1"  (1.549 m)   Wt 165 lb (74.8 kg)   BMI 31.18 kg/m   Wt Readings from Last 3 Encounters:  11/05/16 165 lb (74.8 kg)  10/25/16 169 lb 12.8 oz (77 kg)    09/20/16 164 lb (74.4 kg)    Physical Exam  Constitutional: She is oriented to person, place, and time. She appears well-developed and well-nourished. No distress.  Eyes: Conjunctivae are normal.  Musculoskeletal: Normal range of motion.  Neurological: She is alert and oriented to person, place, and time. Coordination normal.  Skin: Skin is warm and dry. Rash noted. Rash is macular (Pink irritated rash with no drainage or induration or fluctuation that spreads through both intertrigo areas and under her pannus bilaterally. Satellite lesions, consistent with yeast dermatitis). She is not diaphoretic.  Psychiatric: She has a normal mood and affect. Her behavior is normal.  Nursing note and vitals reviewed.     Assessment & Plan:   Problem List Items Addressed This Visit    None    Visit Diagnoses    Yeast dermatitis    -  Primary   Relevant Medications   fluconazole (DIFLUCAN) 150 MG tablet   tolnaftate (LAMISIL AF DEFENSE) 1 % powder       Follow up plan: Return if symptoms worsen or fail to improve.  Counseling provided for all of the vaccine components No orders of the defined types were placed in this encounter.   Caryl Pina, MD [facility] 11/05/2016, 12:15 PM

## 2016-11-06 LAB — CUP PACEART REMOTE DEVICE CHECK
Date Time Interrogation Session: 20180803144108
Implantable Pulse Generator Implant Date: 20150917

## 2016-11-08 ENCOUNTER — Encounter: Payer: Self-pay | Admitting: Neurology

## 2016-11-08 ENCOUNTER — Ambulatory Visit (INDEPENDENT_AMBULATORY_CARE_PROVIDER_SITE_OTHER): Payer: Medicare Other | Admitting: Neurology

## 2016-11-08 VITALS — BP 150/90 | HR 72 | Resp 22 | Ht 61.0 in | Wt 167.0 lb

## 2016-11-08 DIAGNOSIS — R42 Dizziness and giddiness: Secondary | ICD-10-CM | POA: Diagnosis not present

## 2016-11-08 DIAGNOSIS — R269 Unspecified abnormalities of gait and mobility: Secondary | ICD-10-CM
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
## 2016-11-09 ENCOUNTER — Other Ambulatory Visit (INDEPENDENT_AMBULATORY_CARE_PROVIDER_SITE_OTHER): Payer: Self-pay

## 2016-11-09 ENCOUNTER — Telehealth: Payer: Self-pay | Admitting: Neurology

## 2016-11-09 ENCOUNTER — Ambulatory Visit: Payer: Medicare Other

## 2016-11-09 ENCOUNTER — Other Ambulatory Visit: Payer: Medicare Other

## 2016-11-09 DIAGNOSIS — N179 Acute kidney failure, unspecified: Secondary | ICD-10-CM

## 2016-11-09 DIAGNOSIS — R899 Unspecified abnormal finding in specimens from other organs, systems and tissues: Secondary | ICD-10-CM | POA: Diagnosis not present

## 2016-11-09 DIAGNOSIS — E538 Deficiency of other specified B group vitamins: Secondary | ICD-10-CM | POA: Diagnosis not present

## 2016-11-09 DIAGNOSIS — Z0289 Encounter for other administrative examinations: Secondary | ICD-10-CM

## 2016-11-09 LAB — VITAMIN D 25 HYDROXY (VIT D DEFICIENCY, FRACTURES): Vit D, 25-Hydroxy: 15.9 ng/mL — ABNORMAL LOW (ref 30.0–100.0)

## 2016-11-09 LAB — C-REACTIVE PROTEIN: CRP: 9.2 mg/L — AB (ref 0.0–4.9)

## 2016-11-09 LAB — TSH: TSH: 2.39 u[IU]/mL (ref 0.450–4.500)

## 2016-11-09 LAB — RPR: RPR Ser Ql: NONREACTIVE

## 2016-11-09 LAB — SEDIMENTATION RATE: Sed Rate: 82 mm/hr — ABNORMAL HIGH (ref 0–40)

## 2016-11-09 LAB — FOLATE: Folate: 9.1 ng/mL (ref >3.0)

## 2016-11-09 LAB — VITAMIN B12: VITAMIN B 12: 189 pg/mL — AB (ref 232–1245)

## 2016-11-09 NOTE — Telephone Encounter (Signed)
Please call patient laboratory evaluation showed significant elevated ESR 82, C-reactive protein 9.2, there was also evidence of vitamin B12 deficiency.  I have ordered repeat laboratory evaluations, she should come in for labs without appointment, and also start vitamin B12 supplements IM, 1000 micrograms daily for one week, weekly for one month, monthly  There is also evidence of vitamin D deficiency 15.9, she should take over-the-counter vitamin D3 supplement 2000 units daily

## 2016-11-09 NOTE — Telephone Encounter (Addendum)
Spoke with patient's daughter Vaughan Basta who takes care of all patient's doctors appointments. Informed her that Dr Krista Blue wants patient to come in today if possible to have more labs drawn. Advised Vaughan Basta she has some elevated labs, and Dr Krista Blue wants to do further testing. Renato Gails that patient's Vit B12 and Vit D levels are low. Advised her the patient  needs to be set up for Vit B12 injections, daily for 1 week then injections will be spaced out. Advised her to buy OTC Vit D3 2000 units for patient to take daily. The patient lives in [place], so this RN advised that Vit B12 injections can be set up with PCP, Dr Wendi Snipes. Dr Alen Bleacher fax [phone], phone [phone].  Vaughan Basta wrote down information and stated she would have patient here today for labs. This RN advised she must arrive by 12 noon, preferably before that as office closes at noon. Vaughan Basta verbalized understanding, agreement.   Patient was able to come in today for labs but could not give urine specimen.

## 2016-11-13 ENCOUNTER — Encounter: Payer: Self-pay | Admitting: *Deleted

## 2016-11-13 NOTE — Telephone Encounter (Signed)
Pt daughter Vaughan Basta) calling stating that she was told she would get a call on yesterday(8-20) that she would get a call with test results, she is asking to be called

## 2016-11-13 NOTE — Telephone Encounter (Signed)
Returned call to [employer] on HIPAA) - she is aware of the available results.  She would like [place] to fax orders to patient's PCP for B12 injections.  Lab results and Dr. Rhea Belton recommended B12 injection schedule faxed to Dr. Kenn File for his review.

## 2016-11-14 ENCOUNTER — Other Ambulatory Visit: Payer: Self-pay | Admitting: *Deleted

## 2016-11-14 ENCOUNTER — Telehealth: Payer: Self-pay | Admitting: Neurology

## 2016-11-14 DIAGNOSIS — I7090 Unspecified atherosclerosis: Secondary | ICD-10-CM

## 2016-11-14 LAB — ANA W/REFLEX IF POSITIVE: Anti Nuclear Antibody(ANA): NEGATIVE

## 2016-11-14 LAB — METHYLMALONIC ACID, SERUM: Methylmalonic Acid: 778 nmol/L — ABNORMAL HIGH (ref 0–378)

## 2016-11-14 LAB — HOMOCYSTEINE: HOMOCYSTEINE: 14.3 umol/L (ref 0.0–15.0)

## 2016-11-14 LAB — C-REACTIVE PROTEIN: CRP: 10.2 mg/L — AB (ref 0.0–4.9)

## 2016-11-14 LAB — SEDIMENTATION RATE: Sed Rate: 33 mm/hr (ref 0–40)

## 2016-11-14 LAB — RHEUMATOID FACTOR: Rhuematoid fact SerPl-aCnc: <10 IU/mL (ref 0.0–13.9)

## 2016-11-14 LAB — CK: CK TOTAL: 21 U/L — AB (ref 24–173)

## 2016-11-14 LAB — FOLATE: Folate: 10.3 ng/mL (ref >3.0)

## 2016-11-14 MED ORDER — ROSUVASTATIN CALCIUM 20 MG PO TABS: 20.0000 mg | ORAL_TABLET | Freq: Every day | ORAL | 0 refills | Status: DC

## 2016-11-14 NOTE — Telephone Encounter (Signed)
Spoke to her daughter, Vaughan Basta - she is aware of results and states her mother will be starting her B12 injections at her PCP office.

## 2016-11-14 NOTE — Telephone Encounter (Signed)
Repeat laboratory evaluation continue show evidence of vitamin B12 deficiency, make sure she is getting vitamin B 12 IM supplement.  On the repeat ESR was within normal limit, continue mild elevated C reactive protein 10.2.  There is no evidence of diffuse inflammatory process.

## 2016-11-19 ENCOUNTER — Other Ambulatory Visit: Payer: Self-pay

## 2016-11-19 DIAGNOSIS — E538 Deficiency of other specified B group vitamins: Secondary | ICD-10-CM

## 2016-11-19 MED ORDER — CYANOCOBALAMIN 1000 MCG/ML IJ SOLN: 1000.0000 ug | INTRAMUSCULAR | Status: AC

## 2016-11-19 MED ORDER — CYANOCOBALAMIN 1000 MCG/ML IJ SOLN: 1000.0000 ug | Freq: Every day | INTRAMUSCULAR | Status: AC

## 2016-11-20 ENCOUNTER — Ambulatory Visit: Payer: Self-pay

## 2016-11-20 ENCOUNTER — Ambulatory Visit (HOSPITAL_COMMUNITY)
Admission: RE | Admit: 2016-11-20 | Discharge: 2016-11-20 | Disposition: A | Discharge status: Home / Self Care | Payer: Medicare Other | Source: Ambulatory Visit | Attending: Neurology | Admitting: Neurology

## 2016-11-20 DIAGNOSIS — R42 Dizziness and giddiness: Secondary | ICD-10-CM

## 2016-11-20 DIAGNOSIS — I6523 Occlusion and stenosis of bilateral carotid arteries: Secondary | ICD-10-CM | POA: Diagnosis not present

## 2016-11-20 DIAGNOSIS — R269 Unspecified abnormalities of gait and mobility: Secondary | ICD-10-CM

## 2016-11-20 NOTE — Progress Notes (Signed)
*  PRELIMINARY RESULTS* Vascular Ultrasound Carotid Duplex (Doppler) has been completed.  Preliminary findings: Bilateral: No significant (1-39%) ICA stenosis. Antegrade vertebral flow.   Appears unchanged from 06/21/16.  Landry Mellow, RDMS, RVT  11/20/2016, 3:03 PM

## 2016-11-21 ENCOUNTER — Ambulatory Visit: Payer: Self-pay

## 2016-11-22 LAB — VAS US CAROTID
LCCAPDIAS: 9 cm/s
LCCAPSYS: 82 cm/s
LEFT ECA DIAS: -5 cm/s
LEFT VERTEBRAL DIAS: 11 cm/s
LICADSYS: -51 cm/s
Left CCA dist dias: 13 cm/s
Left CCA dist sys: 84 cm/s
Left ICA dist dias: -12 cm/s
Left ICA prox dias: -19 cm/s
Left ICA prox sys: -85 cm/s
RIGHT ECA DIAS: -7 cm/s
RIGHT VERTEBRAL DIAS: -7 cm/s
Right CCA prox dias: 15 cm/s
Right CCA prox sys: 92 cm/s
Right cca dist sys: -63 cm/s

## 2016-11-27 ENCOUNTER — Other Ambulatory Visit: Payer: Self-pay | Admitting: Family Medicine

## 2016-11-27 ENCOUNTER — Ambulatory Visit (INDEPENDENT_AMBULATORY_CARE_PROVIDER_SITE_OTHER): Payer: Medicare Other | Admitting: *Deleted

## 2016-11-27 DIAGNOSIS — E039 Hypothyroidism, unspecified: Secondary | ICD-10-CM

## 2016-11-27 DIAGNOSIS — R55 Syncope and collapse: Secondary | ICD-10-CM

## 2016-11-28 ENCOUNTER — Telehealth: Payer: Self-pay | Admitting: Cardiology

## 2016-11-28 NOTE — Telephone Encounter (Signed)
Spoke w/ pt and requested that he send a manual transmission b/c his home monitor has not updated in at least 14 days.   

## 2016-11-29 NOTE — Progress Notes (Signed)
[facility] Summary Report / Loop Recorder 

## 2016-12-02 LAB — CUP PACEART REMOTE DEVICE CHECK
Date Time Interrogation Session: 20180902154329
Implantable Pulse Generator Implant Date: 20150917

## 2016-12-05 ENCOUNTER — Telehealth: Payer: Self-pay | Admitting: Cardiology

## 2016-12-05 NOTE — Telephone Encounter (Signed)
Attempted to call pt b/c her home monitor has not updated in at least 14 days. No answer and unable to leave a message.  

## 2016-12-13 ENCOUNTER — Encounter: Payer: Self-pay | Admitting: Neurology

## 2016-12-14 ENCOUNTER — Encounter: Payer: Self-pay | Admitting: Cardiology

## 2016-12-25 ENCOUNTER — Encounter: Payer: Medicare Other | Admitting: *Deleted

## 2017-01-03 ENCOUNTER — Encounter: Payer: Self-pay | Admitting: Cardiology

## 2017-02-05 ENCOUNTER — Encounter: Payer: Self-pay | Admitting: Family Medicine

## 2017-02-05 ENCOUNTER — Telehealth: Payer: Self-pay | Admitting: Family Medicine

## 2017-02-05 ENCOUNTER — Ambulatory Visit (INDEPENDENT_AMBULATORY_CARE_PROVIDER_SITE_OTHER): Payer: Medicare Other | Admitting: Family Medicine

## 2017-02-05 VITALS — BP 137/71 | HR 63 | Temp 97.3°F | Ht 61.0 in | Wt 173.2 lb

## 2017-02-05 DIAGNOSIS — M461 Sacroiliitis, not elsewhere classified: Secondary | ICD-10-CM

## 2017-02-05 MED ORDER — METHYLPREDNISOLONE ACETATE 80 MG/ML IJ SUSP
80.0000 mg | Freq: Once | INTRAMUSCULAR | Status: AC
  Administered 2017-02-05: 80 mg via INTRAMUSCULAR

## 2017-02-05 NOTE — Progress Notes (Signed)
   HPI  Patient presents today with hip pain.  Patient states that for the last 3 days she has had right-sided hip pain pointing to her right low back.  She denies any radiation of the pain.  She has no problems walking.  Tramadol has not helped her pain. 600 mg of Motrin given from her daughter has helped.  She denies any injury. She denies any fever, chills, sweats.   PMH: Smoking status noted ROS: Per HPI  Objective: BP 137/71   Pulse 63   Temp (!) 97.3 F (36.3 C) (Oral)   Ht 5\' 1"  (1.549 m)   Wt 173 lb 3.2 oz (78.6 kg)   BMI 32.73 kg/m  Gen: NAD, alert, cooperative with exam HEENT: NCAT, EOMI, PERRL CV: RRR, good S1/S2, no murmur Resp: CTABL, no wheezes, non-labored Ext: No edema, warm Neuro: Alert and oriented, No gross deficits MSK:  Tenderness to palpation of the right SI joint, no midline tenderness or paraspinal muscle tenderness  Assessment and plan:  #SI joint inflammation, sacroiliitis Continue tramadol as needed, also try tramadol.  Also given IM Depo-Medrol today. Return to clinic with any concerns   Meds ordered this encounter  Medications  . methylPREDNISolone acetate (DEPO-MEDROL) injection 80 mg    Laroy Apple, MD [facility] 02/05/2017, 3:51 PM

## 2017-02-05 NOTE — Telephone Encounter (Signed)
Apt made for today with Dr. Wendi Snipes. States this is a new onset of pain

## 2017-02-05 NOTE — Patient Instructions (Signed)
Great to see you!  See the handout I gave you for more info about SI joint pain.

## 2017-02-13 ENCOUNTER — Ambulatory Visit: Payer: Medicare Other | Admitting: Neurology

## 2017-02-21 ENCOUNTER — Other Ambulatory Visit: Payer: Self-pay | Admitting: Family Medicine

## 2017-02-21 DIAGNOSIS — I7090 Unspecified atherosclerosis: Secondary | ICD-10-CM

## 2017-02-25 ENCOUNTER — Ambulatory Visit: Payer: Medicare Other | Admitting: Neurology

## 2017-02-25 ENCOUNTER — Telehealth: Payer: Self-pay | Admitting: *Deleted

## 2017-02-25 NOTE — Telephone Encounter (Signed)
No show - canceled follow up same day due to sickness.

## 2017-03-13 ENCOUNTER — Other Ambulatory Visit: Payer: Self-pay | Admitting: Family Medicine

## 2017-05-16 ENCOUNTER — Ambulatory Visit: Payer: Medicare Other | Admitting: Family Medicine

## 2017-05-17 ENCOUNTER — Encounter: Payer: Self-pay | Admitting: Family Medicine

## 2017-05-20 ENCOUNTER — Other Ambulatory Visit: Payer: Self-pay | Admitting: Family Medicine

## 2017-05-20 DIAGNOSIS — I7090 Unspecified atherosclerosis: Secondary | ICD-10-CM

## 2017-05-26 NOTE — Progress Notes (Signed)
Subjective: CC: "weak" PCP: Timmothy Euler, MD AXK:[id number] Leah Levine is a 84 y.o. female presenting to clinic today for:  1. Weakness Patient is accompanied by her daughter today, who notes that she seems to be becoming more progressively weak over the last month.  She notes that she has difficulty rising from a seated position independently.  She often requires assistance to move around.  Patient does report that she uses both a walker and a cane at home regular.  She intermittently uses wheelchairs if she expects to need to go longer distances.  She denies any recent falls but does note that she seems to be stumbling quite a bit.  No recent cognitive changes.  No nausea, vomiting, diarrhea, fever, chills, decreased p.o. intake.  She does have a neurologist but has not followed up in several months.  She does have a 24-hour caregiver.  She has been seen by physical therapy in the past but this is been greater than 6 months ago.  Past medical history significant for strokes, coronary artery disease, TIA, hypothyroidism  ROS: Per HPI  Allergies  Allergen Reactions  . Celebrex [Celecoxib] Other (See Comments)    Muscle aches and leg pains  . Fenofibrate Other (See Comments)    Muscle aches and leg pains  . Statins Other (See Comments)    Myalgias; muscle aches and leg pains (Atoravastatin, Simvastatin) - daugther does not recall Crestor trial, williing to try 06/21/16   Past Medical History:  Diagnosis Date  . Allergy   . Anemia   . CAD (coronary artery disease) 11-2012   CABG x 4 utilizing LIMA to LAD, SVG to Diagonal, SVG to Left Circumflex, and SVG to RCA  . Diabetes mellitus type 2, controlled ([place])   . GERD (gastroesophageal reflux disease)   . Hearing loss   . Hyperlipidemia   . Hypertension    patient denies ever having hypertension  . Hypothyroidism   . Personal history of colonic polyps 11/20/2010   tubular adenomas  . Stroke ([place])   . Tremor   . Vision  abnormalities     Current Outpatient Medications:  .  aspirin 81 MG chewable tablet, Chew 81 mg by mouth daily., Disp: , Rfl:  .  Cholecalciferol (VITAMIN D3) 2000 units TABS, Take 2,000 Units by mouth daily., Disp: , Rfl:  .  DOK PLUS 50-8.6 MG tablet, TAKE 1 TABLET AT BEDTIME AS NEEDED FOR MILD CONSTIPATION, Disp: 30 tablet, Rfl: 5 .  ketoconazole (NIZORAL) 2 % cream, Apply 1 application topically 2 (two) times daily., Disp: 60 g, Rfl: 2 .  levothyroxine (SYNTHROID, LEVOTHROID) 112 MCG tablet, TAKE 1 TABLET ONCE DAILY BEFORE BREAKFAST, Disp: 90 tablet, Rfl: 3 .  metFORMIN (GLUCOPHAGE) 1000 MG tablet, TAKE (1) TABLET TWICE A DAY WITH MEALS (BREAKFAST AND SUPPER), Disp: 180 tablet, Rfl: 0 .  pantoprazole (PROTONIX) 40 MG tablet, TAKE 1 TABLET DAILY, Disp: 90 tablet, Rfl: 0 .  propranolol (INDERAL) 20 MG tablet, Take 20 mg by mouth 2 (two) times daily., Disp: , Rfl:  .  propranolol (INDERAL) 20 MG tablet, TAKE (1) TABLET TWICE A DAY., Disp: 180 tablet, Rfl: 0 .  rosuvastatin (CRESTOR) 20 MG tablet, TAKE 1 TABLET DAILY, Disp: 90 tablet, Rfl: 0 .  tolnaftate (LAMISIL AF DEFENSE) 1 % powder, Apply 1 application topically 2 (two) times daily., Disp: 108 g, Rfl: 2 .  traMADol (ULTRAM) 50 MG tablet, Take 50 mg by mouth every 6 (six) hours as needed for moderate pain  or severe pain., Disp: , Rfl:  .  traZODone (DESYREL) 100 MG tablet, TAKE 1 OR 2 TABLETS AT BEDTIME, Disp: 60 tablet, Rfl: 1  Current Facility-Administered Medications:  .  cyanocobalamin ((VITAMIN B-12)) injection 1,000 mcg, 1,000 mcg, Intramuscular, Q30 days, Timmothy Euler, MD Social History   Socioeconomic History  . Marital status: Widowed    Spouse name: Shanon Brow  . Number of children: 2  . Years of education: 2  . Highest education level: Not on file  Social Needs  . Financial resource strain: Not on file  . Food insecurity - worry: Not on file  . Food insecurity - inability: Not on file  . Transportation needs -  medical: Not on file  . Transportation needs - non-medical: Not on file  Occupational History  . Occupation: Retired    Comment: waitress/cook  Tobacco Use  . Smoking status: Never Smoker  . Smokeless tobacco: Never Used  Substance and Sexual Activity  . Alcohol use: No  . Drug use: No  . Sexual activity: No  Other Topics Concern  . Not on file  Social History Narrative   Lives in [place] with her grandchildren.  She is widowed.  Her husband died in 06/13/08.     Family History  Problem Relation Age of Onset  . Ovarian cancer Mother   . Diabetes Father   . Pneumonia Father   . Diabetes Sister   . Stroke Daughter   . Heart attack Sister   . Heart attack Brother     Objective: Office vital signs reviewed. BP 139/72   Pulse 65   Temp (!) 97 F (36.1 C) (Oral)   Ht 5' (1.524 m)   Wt 174 lb (78.9 kg)   SpO2 97%   BMI 33.98 kg/m   Physical Examination:  General: Awake, alert, well nourished, nontoxic appearing, No acute distress HEENT: MMM Cardio: regular rate Pulm: Normal work of breathing on room air Extremities: warm, No edema, cyanosis or clubbing MSK: requires assistance for ambulation. Gait unsteady. Neuro: 5/5 LE Strength.  Some difficulty following commands (specifically with UE cerebellar testing).  Slight dysmetria noted on the left.  Unable to cooperate with LE cerebellar testing.  Responds to questioning appropriately.   Assessment/ Plan: 84 y.o. female   1. History of stroke - Ambulatory referral to Physical Therapy  2. Balance problem It appears that she has had a history of gait abnormality in the past.  Her gait was quite unsteady during today's exam.  Romberg was not assessed because she was unsteady with simply standing with open eyes.  She had slight dysmetria with left upper extremity cerebellar testing.  Unable to perform lower extremity cerebellar testing.  She actually had normal strength of the lower extremities.  I do wonder if this is a central  issue.  I did highly recommend that she see her neurologist.  She may benefit from additional imaging studies.  Given the chronicity of the issue, I did not send her to the emergency department, as I did not think this will change her plan at this point.  We will check CMP, CBC, TSH and vitamin B12 to rule out metabolic causes.  It appears that she has had a vitamin B12 deficiency in the past.  See only one administration of vitamin B12 since lab result.  Will contact patient's caregiver with results once they are available. Strict return precautions and reasons for emergent evaluation in the emergency department review with patient.  They voiced understanding  and will follow-up as needed. - Ambulatory referral to Physical Therapy  3. Frequent falls - Ambulatory referral to Physical Therapy - CMP14+EGFR - CBC with Differential - TSH - Vitamin B12  4. Gait abnormality - Ambulatory referral to Physical Therapy - Vitamin B12  5. Hypothyroidism, unspecified type - TSH   Orders Placed This Encounter  Procedures  . CMP14+EGFR  . CBC with Differential  . TSH  . Vitamin B12  . Ambulatory referral to Physical Therapy    Referral Priority:   Routine    Referral Type:   Physical Medicine    Referral Reason:   Specialty Services Required    Requested Specialty:   Physical Therapy    Number of Visits Requested:   [place], [place] [phone]

## 2017-05-27 ENCOUNTER — Ambulatory Visit (INDEPENDENT_AMBULATORY_CARE_PROVIDER_SITE_OTHER): Payer: Medicare Other | Admitting: Family Medicine

## 2017-05-27 ENCOUNTER — Encounter: Payer: Self-pay | Admitting: Family Medicine

## 2017-05-27 VITALS — BP 139/72 | HR 65 | Temp 97.0°F | Ht 60.0 in | Wt 174.0 lb

## 2017-05-27 DIAGNOSIS — E039 Hypothyroidism, unspecified: Secondary | ICD-10-CM | POA: Diagnosis not present

## 2017-05-27 DIAGNOSIS — R296 Repeated falls: Secondary | ICD-10-CM

## 2017-05-27 DIAGNOSIS — R269 Unspecified abnormalities of gait and mobility: Secondary | ICD-10-CM | POA: Diagnosis not present

## 2017-05-27 DIAGNOSIS — R2689 Other abnormalities of gait and mobility: Secondary | ICD-10-CM

## 2017-05-27 DIAGNOSIS — Z8673 Personal history of transient ischemic attack (TIA), and cerebral infarction without residual deficits: Secondary | ICD-10-CM | POA: Diagnosis not present

## 2017-05-27 NOTE — Patient Instructions (Addendum)
You had labs performed today.  You will be contacted with the results of the labs once they are available, usually in the next 3 days for routine lab work.  I referred you to physical therapy in [place] with the [facility]rehab center.  I do recommend that you go ahead and have her rescheduled with her neurologist for recheck.  Her perceived weakness I think really is more of a gait/balance disturbance.  This is likely related to her history of strokes.  Dr Krista Blue (Neurology) phone number is: [phone]

## 2017-05-28 LAB — CMP14+EGFR
A/G RATIO: 1.4 (ref 1.2–2.2)
ALBUMIN: 3.8 g/dL (ref 3.5–4.7)
ALT: 6 IU/L (ref 0–32)
AST: 12 IU/L (ref 0–40)
Alkaline Phosphatase: 78 IU/L (ref 39–117)
BILIRUBIN TOTAL: 0.3 mg/dL (ref 0.0–1.2)
BUN / CREAT RATIO: 10 — AB (ref 12–28)
BUN: 10 mg/dL (ref 8–27)
CALCIUM: 8.8 mg/dL (ref 8.7–10.3)
CHLORIDE: 98 mmol/L (ref 96–106)
CO2: 24 mmol/L (ref 20–29)
Creatinine, Ser: 1.02 mg/dL — ABNORMAL HIGH (ref 0.57–1.00)
GFR, EST AFRICAN AMERICAN: 59 mL/min/{1.73_m2} — AB (ref >59)
GFR, EST NON AFRICAN AMERICAN: 51 mL/min/{1.73_m2} — AB (ref >59)
GLUCOSE: 288 mg/dL — AB (ref 65–99)
Globulin, Total: 2.8 g/dL (ref 1.5–4.5)
Potassium: 4.4 mmol/L (ref 3.5–5.2)
Sodium: 137 mmol/L (ref 134–144)
TOTAL PROTEIN: 6.6 g/dL (ref 6.0–8.5)

## 2017-05-28 LAB — CBC WITH DIFFERENTIAL/PLATELET
Basophils Absolute: 0 10*3/uL (ref 0.0–0.2)
Basos: 0 %
EOS (ABSOLUTE): 0.1 10*3/uL (ref 0.0–0.4)
Eos: 2 %
HEMATOCRIT: 38.8 % (ref 34.0–46.6)
HEMOGLOBIN: 12.2 g/dL (ref 11.1–15.9)
IMMATURE GRANS (ABS): 0 10*3/uL (ref 0.0–0.1)
Immature Granulocytes: 1 %
LYMPHS ABS: 2.3 10*3/uL (ref 0.7–3.1)
LYMPHS: 30 %
MCH: 25.3 pg — AB (ref 26.6–33.0)
MCHC: 31.4 g/dL — AB (ref 31.5–35.7)
MCV: 81 fL (ref 79–97)
MONOCYTES: 5 %
Monocytes Absolute: 0.4 10*3/uL (ref 0.1–0.9)
NEUTROS ABS: 4.8 10*3/uL (ref 1.4–7.0)
Neutrophils: 62 %
Platelets: 239 10*3/uL (ref 150–379)
RBC: 4.82 x10E6/uL (ref 3.77–5.28)
RDW: 16.1 % — ABNORMAL HIGH (ref 12.3–15.4)
WBC: 7.7 10*3/uL (ref 3.4–10.8)

## 2017-05-28 LAB — TSH: TSH: 3.52 u[IU]/mL (ref 0.450–4.500)

## 2017-05-28 LAB — VITAMIN B12: Vitamin B-12: 213 pg/mL — ABNORMAL LOW (ref 232–1245)

## 2017-05-29 ENCOUNTER — Other Ambulatory Visit: Payer: Self-pay | Admitting: Family Medicine

## 2017-05-29 NOTE — Telephone Encounter (Signed)
Last seen 05/27/17  Dr Darnell Level  Dr Wendi Snipes PCP

## 2017-07-02 DIAGNOSIS — H6122 Impacted cerumen, left ear: Secondary | ICD-10-CM | POA: Diagnosis not present

## 2017-08-21 ENCOUNTER — Other Ambulatory Visit: Payer: Self-pay | Admitting: Family Medicine

## 2017-08-21 DIAGNOSIS — I7090 Unspecified atherosclerosis: Secondary | ICD-10-CM

## 2018-03-04 DIAGNOSIS — M79672 Pain in left foot: Secondary | ICD-10-CM | POA: Diagnosis not present

## 2018-03-04 DIAGNOSIS — Z79899 Other long term (current) drug therapy: Secondary | ICD-10-CM | POA: Diagnosis not present

## 2018-03-04 DIAGNOSIS — W208XXA Other cause of strike by thrown, projected or falling object, initial encounter: Secondary | ICD-10-CM | POA: Diagnosis not present

## 2018-03-04 DIAGNOSIS — S90112A Contusion of left great toe without damage to nail, initial encounter: Secondary | ICD-10-CM | POA: Diagnosis not present

## 2018-03-04 DIAGNOSIS — S90122A Contusion of left lesser toe(s) without damage to nail, initial encounter: Secondary | ICD-10-CM | POA: Diagnosis not present

## 2018-03-04 DIAGNOSIS — Z7984 Long term (current) use of oral hypoglycemic drugs: Secondary | ICD-10-CM | POA: Diagnosis not present

## 2018-04-25 DIAGNOSIS — I10 Essential (primary) hypertension: Secondary | ICD-10-CM | POA: Diagnosis not present

## 2018-04-25 DIAGNOSIS — G250 Essential tremor: Secondary | ICD-10-CM | POA: Diagnosis not present

## 2018-04-25 DIAGNOSIS — I639 Cerebral infarction, unspecified: Secondary | ICD-10-CM | POA: Diagnosis not present

## 2018-04-25 DIAGNOSIS — E119 Type 2 diabetes mellitus without complications: Secondary | ICD-10-CM | POA: Diagnosis not present

## 2018-04-25 DIAGNOSIS — E039 Hypothyroidism, unspecified: Secondary | ICD-10-CM | POA: Diagnosis not present

## 2018-05-23 DIAGNOSIS — R42 Dizziness and giddiness: Secondary | ICD-10-CM | POA: Diagnosis not present

## 2018-06-04 DIAGNOSIS — Z79899 Other long term (current) drug therapy: Secondary | ICD-10-CM | POA: Diagnosis not present

## 2018-06-04 DIAGNOSIS — E039 Hypothyroidism, unspecified: Secondary | ICD-10-CM | POA: Diagnosis not present

## 2018-06-04 DIAGNOSIS — I252 Old myocardial infarction: Secondary | ICD-10-CM | POA: Diagnosis not present

## 2018-06-04 DIAGNOSIS — R42 Dizziness and giddiness: Secondary | ICD-10-CM | POA: Diagnosis not present

## 2018-06-04 DIAGNOSIS — Z951 Presence of aortocoronary bypass graft: Secondary | ICD-10-CM | POA: Diagnosis not present

## 2018-06-04 DIAGNOSIS — Z7984 Long term (current) use of oral hypoglycemic drugs: Secondary | ICD-10-CM | POA: Diagnosis not present

## 2018-06-04 DIAGNOSIS — N390 Urinary tract infection, site not specified: Secondary | ICD-10-CM | POA: Diagnosis not present

## 2018-06-04 DIAGNOSIS — E119 Type 2 diabetes mellitus without complications: Secondary | ICD-10-CM | POA: Diagnosis not present

## 2018-06-04 DIAGNOSIS — Z8673 Personal history of transient ischemic attack (TIA), and cerebral infarction without residual deficits: Secondary | ICD-10-CM | POA: Diagnosis not present

## 2018-07-17 DIAGNOSIS — M25551 Pain in right hip: Secondary | ICD-10-CM | POA: Diagnosis not present

## 2018-08-10 IMAGING — CT CT HEAD W/O CM
4 of 10 series · 11 of 47 positions shown, 12 images · non-contrast
Comparison: CT of the head and MRI of the brain performed
12/05/2015

CLINICAL DATA: Status post unwitnessed fall. Syncopal episode. Head
and neck pain. Initial encounter.

EXAM:
CT HEAD WITHOUT CONTRAST
CT CERVICAL SPINE WITHOUT CONTRAST
CT THORACIC SPINE WITHOUT CONTRAST
TECHNIQUE: Multidetector CT imaging of the head, cervical spine and thoracic
spine was performed following the standard protocol without
intravenous contrast. Multiplanar CT image reconstructions of the
cervical spine and thoracic spine were also generated.

[Series 4: bone windows · axial · 0.43mm/px · z∈[-120,-54]mm · 3 of 44 slices shown, 4 images]
[im 11/44  brain]
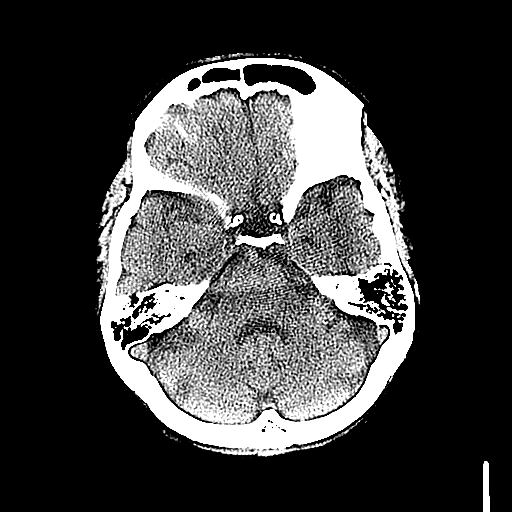
[im 11/44  bone]
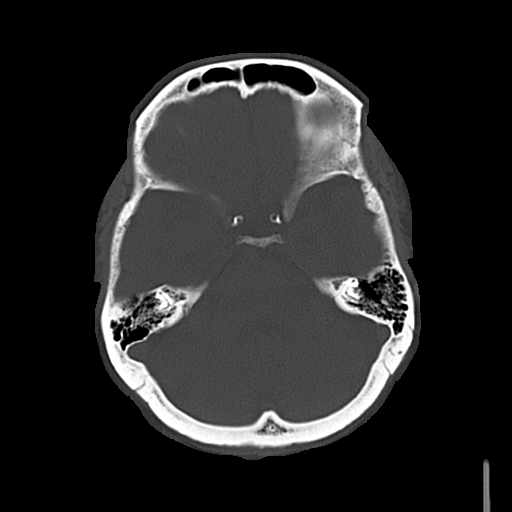
[im 22/44  brain]
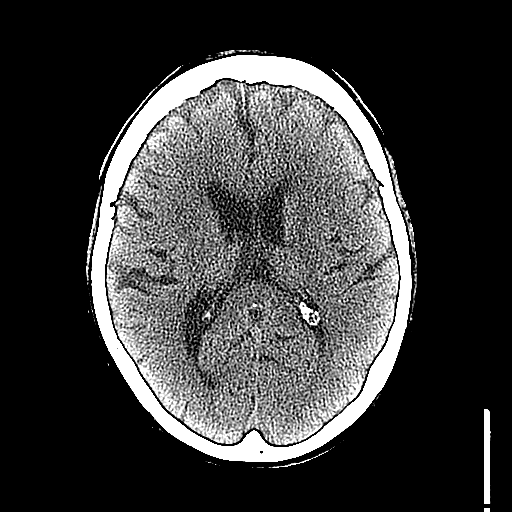
[im 33/44  brain]
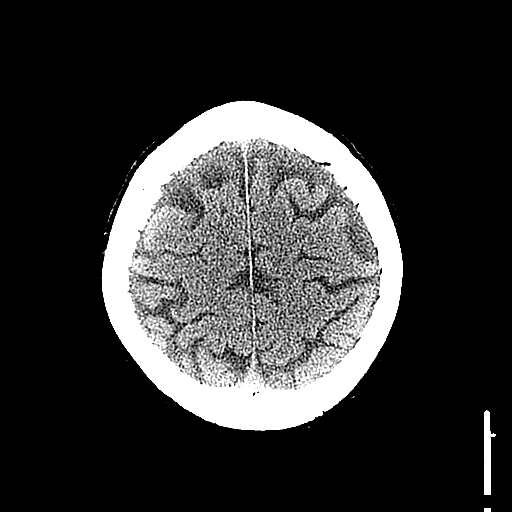

[Series 16: t-spine st · axial · 0.33mm/px · z∈[-492,-386]mm · 4 of 148 slices shown]
[im 11/148  brain]
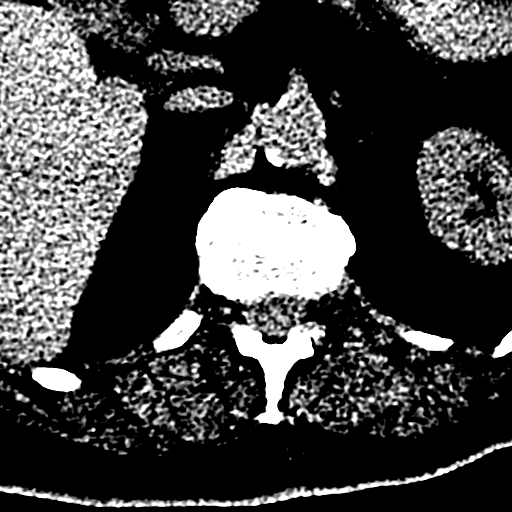
[im 32/148  brain]
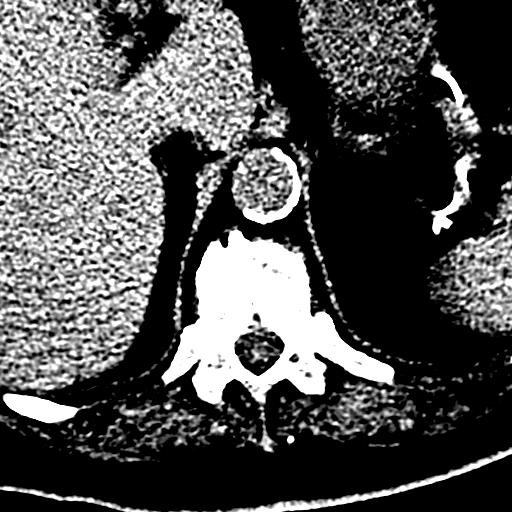
[im 53/148  brain]
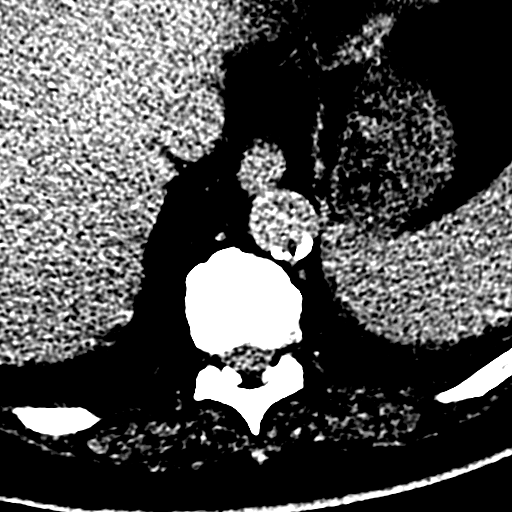
[im 64/148  brain]
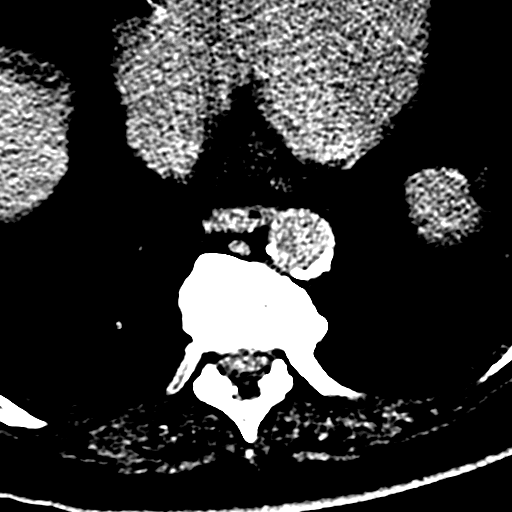

[Series 19: coronal · coronal · 0.24mm/px · 3 of 66 slices shown]
[im 17/66  brain]
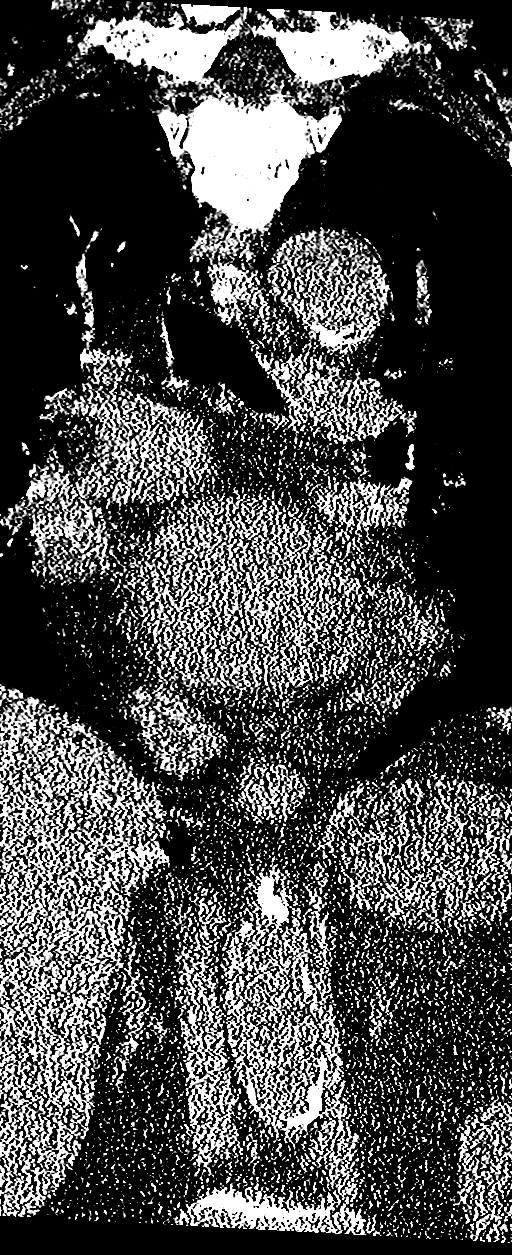
[im 33/66  brain]
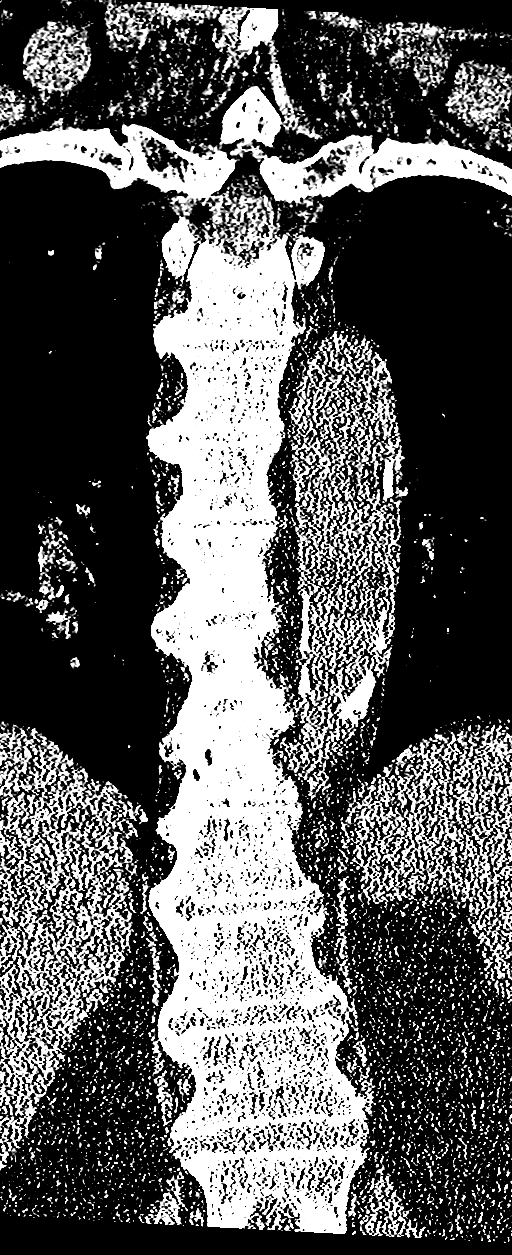
[im 49/66  brain]
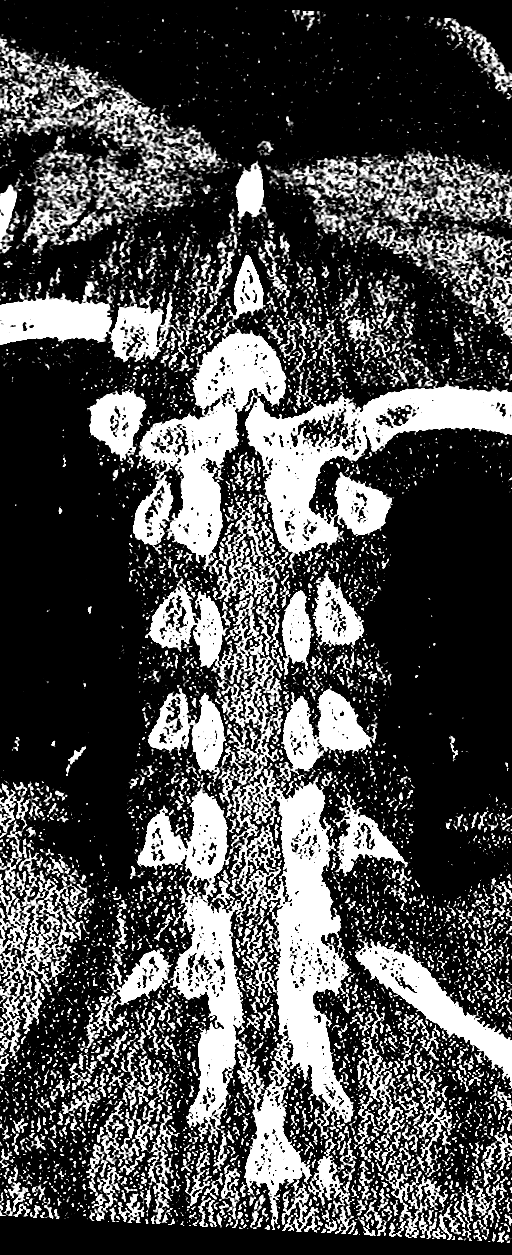

[Series 20: sagittal · sagittal · 0.30mm/px · 1 of 61 slices shown]
[im 31/61  brain]
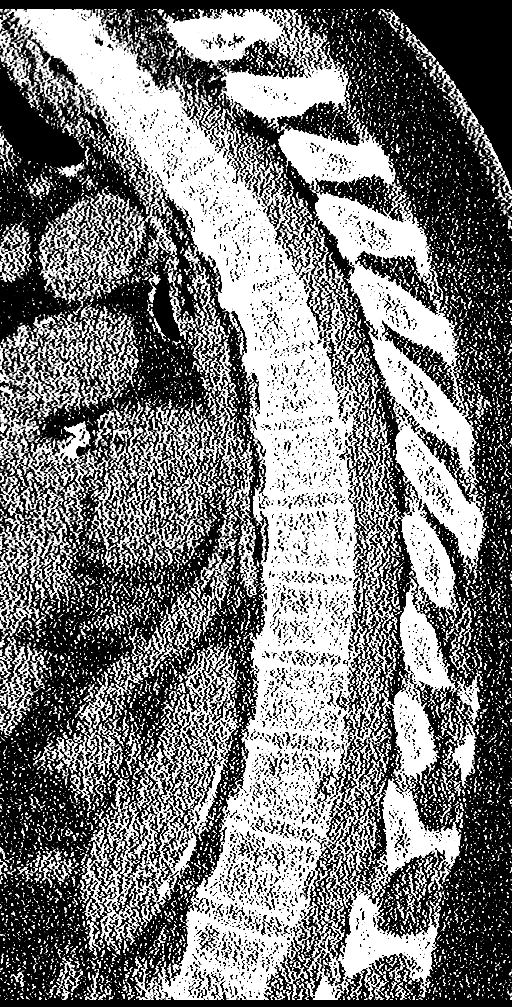

[11 of 47 positions shown; findings below may reference images not displayed]

FINDINGS: CT HEAD FINDINGS

Brain: No evidence of acute infarction, hemorrhage, hydrocephalus,
extra-axial collection or mass lesion/mass effect.

Prominence of the ventricles and sulci reflects mild to moderate
cortical volume loss. Mild periventricular and subcortical white
matter change likely reflects small vessel ischemic microangiopathy.
Chronic lacunar infarcts are noted at the basal ganglia bilaterally.
Cerebellar atrophy is noted.

The brainstem and fourth ventricle are within normal limits. The
cerebral hemispheres demonstrate grossly normal gray-white
differentiation. No mass effect or midline shift is seen.

Vascular: No hyperdense vessel or unexpected calcification.

Skull: There is no evidence of fracture; visualized osseous
structures are unremarkable in appearance.

Sinuses/Orbits: The visualized portions of the orbits are within
normal limits. The paranasal sinuses and mastoid air cells are
well-aerated.

Other: Soft tissue swelling is noted overlying the frontal
calvarium.

CT CERVICAL SPINE FINDINGS

Alignment: There is mild grade 1 anterolisthesis of C3 on C4,
reflecting underlying facet disease.

Skull base and vertebrae: No acute fracture. No primary bone lesion
or focal pathologic process. There is incomplete fusion of the
posterior arch of C1.

Soft tissues and spinal canal: No prevertebral fluid or swelling. No
visible canal hematoma.

Disc levels: Mild disc space narrowing is noted along the lower
cervical and upper thoracic spine, with underlying postoperative
change at the posterior spinous processes.

Upper chest: The visualized lung apices are grossly clear. The
thyroid gland is unremarkable.

Other: No significant soft tissue abnormalities are seen.

CT THORACIC SPINE FINDINGS

Alignment: Normal.

Skull base and vertebrae: No acute fracture. No primary bone lesion
or focal pathologic process.

Soft tissues and spinal canal: The spinal canal is unremarkable in
appearance. No visible canal hematoma.

Disc levels: Intervertebral disc spaces are preserved. Anterior and
lateral bridging osteophytes are noted along the thoracic spine. A
small posterior disc protrusion is noted at T6-T7.

Chest: Mild bilateral atelectasis is noted. A 1.1 cm calcified
granuloma is noted at the left midlung. No pleural effusion or
pneumothorax is seen. Diffuse coronary artery calcifications are
seen. Scattered calcification is noted along the descending thoracic
aorta and proximal abdominal aorta. The patient is status post
cholecystectomy, with clips along the gallbladder fossa. The
paraspinal musculature is unremarkable.

Other: No additional soft tissue abnormalities are seen.
IMPRESSION: 1. No evidence of traumatic intracranial injury or fracture.
2. No evidence of fracture or subluxation along the cervical or
thoracic spine.
3. Soft tissue swelling overlying the frontal calvarium.
4. Mild to moderate cortical volume loss and scattered small vessel
ischemic microangiopathy.
5. Chronic lacunar infarcts at the basal ganglia bilaterally.
6. Mild degenerative change along the lower cervical and upper
thoracic spine, and anterior and lateral bridging osteophytes along
the thoracic spine.
7. Mild bilateral atelectasis noted.
8. Diffuse coronary artery calcifications seen.
9. Scattered aortic atherosclerosis noted.

## 2018-08-30 ENCOUNTER — Observation Stay (HOSPITAL_COMMUNITY)
Admission: EM | Admit: 2018-08-30 | Discharge: 2018-09-03 | Disposition: A | Discharge status: Home / Self Care | Payer: Medicare Other | Attending: Family Medicine | Admitting: Family Medicine

## 2018-08-30 ENCOUNTER — Emergency Department (HOSPITAL_COMMUNITY): Payer: Medicare Other

## 2018-08-30 ENCOUNTER — Observation Stay (HOSPITAL_COMMUNITY): Payer: Medicare Other

## 2018-08-30 ENCOUNTER — Other Ambulatory Visit: Payer: Self-pay

## 2018-08-30 ENCOUNTER — Encounter (HOSPITAL_COMMUNITY): Payer: Self-pay | Admitting: Emergency Medicine

## 2018-08-30 DIAGNOSIS — R4182 Altered mental status, unspecified: Secondary | ICD-10-CM | POA: Diagnosis not present

## 2018-08-30 DIAGNOSIS — Z951 Presence of aortocoronary bypass graft: Secondary | ICD-10-CM | POA: Diagnosis not present

## 2018-08-30 DIAGNOSIS — R2689 Other abnormalities of gait and mobility: Secondary | ICD-10-CM | POA: Insufficient documentation

## 2018-08-30 DIAGNOSIS — Z7984 Long term (current) use of oral hypoglycemic drugs: Secondary | ICD-10-CM | POA: Diagnosis not present

## 2018-08-30 DIAGNOSIS — E1129 Type 2 diabetes mellitus with other diabetic kidney complication: Secondary | ICD-10-CM | POA: Diagnosis present

## 2018-08-30 DIAGNOSIS — E1122 Type 2 diabetes mellitus with diabetic chronic kidney disease: Secondary | ICD-10-CM | POA: Insufficient documentation

## 2018-08-30 DIAGNOSIS — I2510 Atherosclerotic heart disease of native coronary artery without angina pectoris: Secondary | ICD-10-CM | POA: Diagnosis not present

## 2018-08-30 DIAGNOSIS — Z9889 Other specified postprocedural states: Secondary | ICD-10-CM | POA: Diagnosis not present

## 2018-08-30 DIAGNOSIS — J984 Other disorders of lung: Secondary | ICD-10-CM | POA: Diagnosis not present

## 2018-08-30 DIAGNOSIS — R0602 Shortness of breath: Secondary | ICD-10-CM | POA: Diagnosis not present

## 2018-08-30 DIAGNOSIS — E871 Hypo-osmolality and hyponatremia: Secondary | ICD-10-CM | POA: Diagnosis not present

## 2018-08-30 DIAGNOSIS — R531 Weakness: Secondary | ICD-10-CM | POA: Diagnosis not present

## 2018-08-30 DIAGNOSIS — Z20828 Contact with and (suspected) exposure to other viral communicable diseases: Secondary | ICD-10-CM | POA: Diagnosis not present

## 2018-08-30 DIAGNOSIS — E039 Hypothyroidism, unspecified: Secondary | ICD-10-CM | POA: Diagnosis not present

## 2018-08-30 DIAGNOSIS — R069 Unspecified abnormalities of breathing: Secondary | ICD-10-CM | POA: Diagnosis not present

## 2018-08-30 DIAGNOSIS — N390 Urinary tract infection, site not specified: Principal | ICD-10-CM | POA: Insufficient documentation

## 2018-08-30 DIAGNOSIS — I129 Hypertensive chronic kidney disease with stage 1 through stage 4 chronic kidney disease, or unspecified chronic kidney disease: Secondary | ICD-10-CM | POA: Insufficient documentation

## 2018-08-30 DIAGNOSIS — Z8673 Personal history of transient ischemic attack (TIA), and cerebral infarction without residual deficits: Secondary | ICD-10-CM | POA: Diagnosis not present

## 2018-08-30 DIAGNOSIS — Z79899 Other long term (current) drug therapy: Secondary | ICD-10-CM | POA: Diagnosis not present

## 2018-08-30 DIAGNOSIS — R0689 Other abnormalities of breathing: Secondary | ICD-10-CM | POA: Diagnosis not present

## 2018-08-30 DIAGNOSIS — N183 Chronic kidney disease, stage 3 (moderate): Secondary | ICD-10-CM | POA: Diagnosis not present

## 2018-08-30 DIAGNOSIS — I700 Atherosclerosis of aorta: Secondary | ICD-10-CM | POA: Diagnosis not present

## 2018-08-30 DIAGNOSIS — I959 Hypotension, unspecified: Secondary | ICD-10-CM | POA: Diagnosis not present

## 2018-08-30 LAB — COMPREHENSIVE METABOLIC PANEL
ALT: 13 U/L (ref 0–44)
AST: 19 U/L (ref 15–41)
Albumin: 3.7 g/dL (ref 3.5–5.0)
Alkaline Phosphatase: 63 U/L (ref 38–126)
Anion gap: 12 (ref 5–15)
BUN: 18 mg/dL (ref 8–23)
CO2: 24 mmol/L (ref 22–32)
Calcium: 8.8 mg/dL — ABNORMAL LOW (ref 8.9–10.3)
Chloride: 98 mmol/L (ref 98–111)
Creatinine, Ser: 1.25 mg/dL — ABNORMAL HIGH (ref 0.44–1.00)
GFR calc Af Amer: 46 mL/min — ABNORMAL LOW (ref >60)
GFR calc non Af Amer: 40 mL/min — ABNORMAL LOW (ref >60)
Glucose, Bld: 255 mg/dL — ABNORMAL HIGH (ref 70–99)
Potassium: 4.3 mmol/L (ref 3.5–5.1)
Sodium: 134 mmol/L — ABNORMAL LOW (ref 135–145)
Total Bilirubin: 0.8 mg/dL (ref 0.3–1.2)
Total Protein: 7.6 g/dL (ref 6.5–8.1)

## 2018-08-30 LAB — URINALYSIS, ROUTINE W REFLEX MICROSCOPIC
Bilirubin Urine: NEGATIVE
Glucose, UA: 50 mg/dL — AB
Hgb urine dipstick: NEGATIVE
Ketones, ur: NEGATIVE mg/dL
Nitrite: POSITIVE — AB
Protein, ur: NEGATIVE mg/dL
Specific Gravity, Urine: 1.017 (ref 1.005–1.030)
pH: 5 (ref 5.0–8.0)

## 2018-08-30 LAB — CBC WITH DIFFERENTIAL/PLATELET
Abs Immature Granulocytes: 0.05 10*3/uL (ref 0.00–0.07)
Basophils Absolute: 0 10*3/uL (ref 0.0–0.1)
Basophils Relative: 0 %
Eosinophils Absolute: 0.1 10*3/uL (ref 0.0–0.5)
Eosinophils Relative: 1 %
HCT: 42.2 % (ref 36.0–46.0)
Hemoglobin: 13.6 g/dL (ref 12.0–15.0)
Immature Granulocytes: 1 %
Lymphocytes Relative: 11 %
Lymphs Abs: 1.2 10*3/uL (ref 0.7–4.0)
MCH: 26.1 pg (ref 26.0–34.0)
MCHC: 32.2 g/dL (ref 30.0–36.0)
MCV: 80.8 fL (ref 80.0–100.0)
Monocytes Absolute: 0.7 10*3/uL (ref 0.1–1.0)
Monocytes Relative: 7 %
Neutro Abs: 8.8 10*3/uL — ABNORMAL HIGH (ref 1.7–7.7)
Neutrophils Relative %: 80 %
Platelets: 223 10*3/uL (ref 150–400)
RBC: 5.22 MIL/uL — ABNORMAL HIGH (ref 3.87–5.11)
RDW: 15.1 % (ref 11.5–15.5)
WBC: 10.8 10*3/uL — ABNORMAL HIGH (ref 4.0–10.5)
nRBC: 0 % (ref 0.0–0.2)

## 2018-08-30 LAB — D-DIMER, QUANTITATIVE: D-Dimer, Quant: 1.03 ug/mL-FEU — ABNORMAL HIGH (ref 0.00–0.50)

## 2018-08-30 LAB — SARS CORONAVIRUS 2 BY RT PCR (HOSPITAL ORDER, PERFORMED IN ~~LOC~~ HOSPITAL LAB): SARS Coronavirus 2: NEGATIVE

## 2018-08-30 LAB — BRAIN NATRIURETIC PEPTIDE: B Natriuretic Peptide: 41 pg/mL (ref 0.0–100.0)

## 2018-08-30 LAB — TROPONIN I: Troponin I: <0.03 ng/mL (ref <0.03)

## 2018-08-30 MED ORDER — SODIUM CHLORIDE 0.9 % IV SOLN
1.0000 g | Freq: Once | INTRAVENOUS | Status: AC
  Administered 2018-08-30: 1 g via INTRAVENOUS
  Filled 2018-08-30: qty 10

## 2018-08-30 MED ORDER — INSULIN ASPART 100 UNIT/ML ~~LOC~~ SOLN
0.0000 [IU] | Freq: Three times a day (TID) | SUBCUTANEOUS | Status: DC
  Administered 2018-08-31: 5 [IU] via SUBCUTANEOUS
  Administered 2018-08-31: 08:00:00 3 [IU] via SUBCUTANEOUS
  Administered 2018-08-31 – 2018-09-02 (×6): 2 [IU] via SUBCUTANEOUS
  Administered 2018-09-03 (×2): 1 [IU] via SUBCUTANEOUS
  Administered 2018-09-03: 2 [IU] via SUBCUTANEOUS

## 2018-08-30 MED ORDER — INSULIN ASPART 100 UNIT/ML ~~LOC~~ SOLN
0.0000 [IU] | Freq: Every day | SUBCUTANEOUS | Status: DC
  Administered 2018-08-31: 3 [IU] via SUBCUTANEOUS

## 2018-08-30 MED ORDER — SODIUM CHLORIDE 0.9 % IV SOLN
INTRAVENOUS | Status: AC
  Administered 2018-08-31: via INTRAVENOUS

## 2018-08-30 MED ORDER — IOHEXOL 350 MG/ML SOLN
75.0000 mL | Freq: Once | INTRAVENOUS | Status: AC | PRN
  Administered 2018-08-30: 22:00:00 via INTRAVENOUS

## 2018-08-30 MED ORDER — ENOXAPARIN SODIUM 40 MG/0.4ML ~~LOC~~ SOLN
40.0000 mg | SUBCUTANEOUS | Status: DC
  Administered 2018-08-31 – 2018-09-02 (×4): 40 mg via SUBCUTANEOUS
  Filled 2018-08-30 (×4): qty 0.4

## 2018-08-30 MED ORDER — ACETAMINOPHEN 325 MG PO TABS
650.0000 mg | ORAL_TABLET | Freq: Four times a day (QID) | ORAL | Status: DC | PRN
  Administered 2018-08-31: 650 mg via ORAL
  Filled 2018-08-30: qty 2

## 2018-08-30 MED ORDER — SODIUM CHLORIDE 0.9 % IV SOLN
1.0000 g | INTRAVENOUS | Status: DC
  Administered 2018-08-31 – 2018-09-02 (×3): 1 g via INTRAVENOUS
  Filled 2018-08-30 (×3): qty 10

## 2018-08-30 MED ORDER — ACETAMINOPHEN 650 MG RE SUPP: 650.0000 mg | Freq: Four times a day (QID) | RECTAL | Status: DC | PRN

## 2018-08-30 MED ORDER — ALBUTEROL SULFATE (2.5 MG/3ML) 0.083% IN NEBU: 5.0000 mg | INHALATION_SOLUTION | Freq: Once | RESPIRATORY_TRACT | Status: DC

## 2018-08-30 NOTE — ED Notes (Signed)
Daughter Vaughan Basta updated

## 2018-08-30 NOTE — ED Notes (Signed)
Call to daughter Vaughan Basta, [phone]  She is caregiver  She reports that her mother usually can get up by herself  Today, she has been feverish, and weak Unable to get out of chair and walk since 1400  Dr Roderic Palau informed

## 2018-08-30 NOTE — ED Notes (Signed)
Pt to CT via stretcher   She appears brighter in affect and states she feels better and is hungry  She is given crackers and water to nibble and sip

## 2018-08-30 NOTE — ED Notes (Signed)
Pt with a panis and has excoriated skin in the folds that are red an wet

## 2018-08-30 NOTE — ED Notes (Signed)
Dr Earnest Conroy inquires why pt has not gone to CT   States he does not wish to await covid results   Call to [place] informed

## 2018-08-30 NOTE — ED Notes (Signed)
Red excoriated area noted in abdominal folds. RN Webb Silversmith made aware. Moisture barrier applied.

## 2018-08-30 NOTE — ED Provider Notes (Signed)
[facility] EMERGENCY DEPARTMENT Provider Note   CSN: [id number] Arrival date & time: 08/30/18  1857    History   Chief Complaint Chief Complaint  Patient presents with  . Shortness of Breath    HPI Leah Levine is a 85 y.o. female.     Patient's daughter states the patient is weak today and has had a fever.  The patient complains of mild shortness of breath  The history is provided by the patient and a relative. No language interpreter was used.  Weakness  Severity:  Moderate Onset quality:  Sudden Timing:  Constant Progression:  Worsening Chronicity:  New Context: not alcohol use   Relieved by:  Nothing Worsened by:  Nothing Ineffective treatments:  None tried Associated symptoms: shortness of breath   Associated symptoms: no abdominal pain, no chest pain, no cough, no diarrhea, no frequency, no headaches and no seizures     Past Medical History:  Diagnosis Date  . Allergy   . Anemia   . CAD (coronary artery disease) 11-2012   CABG x 4 utilizing LIMA to LAD, SVG to Diagonal, SVG to Left Circumflex, and SVG to RCA  . Diabetes mellitus type 2, controlled ([place])   . GERD (gastroesophageal reflux disease)   . Hearing loss   . Hyperlipidemia   . Hypertension    patient denies ever having hypertension  . Hypothyroidism   . Personal history of colonic polyps 11/20/2010   tubular adenomas  . Stroke ([place])   . Tremor   . Vision abnormalities     Patient Active Problem List   Diagnosis Date Noted  . Dizziness 11/08/2016  . Gait abnormality 11/08/2016  . Aphasia   . TIA (transient ischemic attack) 06/20/2016  . Chest pain 03/06/2016  . Arm pain, anterior, left 03/06/2016  . Orthostatic hypotension 02/21/2016  . Late effect of cerebrovascular accident 11/10/2015  . Foraminal stenosis of cervical region 11/10/2015  . Frequent falls 11/10/2015  . Hypertriglyceridemia 09/05/2015  . Neck muscle spasm 08/26/2015  . CKD stage 3 due to type 2 diabetes  mellitus ([place]) 08/26/2015  . Intertrigo 03/14/2015  . Stroke (cerebrum) ([place]) 03/05/2015  . Expressive aphasia 03/05/2015  . Syncope 12/09/2013  . Coronary artery disease 09/18/2013  . Hypothyroidism 06/18/2013  . S/P CABG x 4 12/01/2012  . Vitamin B12 deficiency 12/05/2010  . Personal history of colonic polyps 12/05/2010  . Esophageal dysphagia 11/20/2010  . Benign neoplasm of colon 11/20/2010  . Gastritis, chronic 11/20/2010  . Allergic rhinitis 07/03/2010  . Tremor   . GERD (gastroesophageal reflux disease)   . Hyperlipidemia   . Hypertension   . Urinary incontinence   . Type 2 diabetes mellitus with renal manifestations [facility])     Past Surgical History:  Procedure Laterality Date  . ABDOMINAL HYSTERECTOMY    . APPENDECTOMY    . BACK SURGERY    . CARPAL TUNNEL RELEASE    . CHOLECYSTECTOMY    . CORONARY ARTERY BYPASS GRAFT N/A 11/26/2012   Procedure: CORONARY ARTERY BYPASS GRAFTING (CABG);  Surgeon: Grace Isaac, MD;  Location: [place];  Service: Open Heart Surgery;  Laterality: N/A;  . INTRAOPERATIVE TRANSESOPHAGEAL ECHOCARDIOGRAM N/A 11/26/2012   Procedure: INTRAOPERATIVE TRANSESOPHAGEAL ECHOCARDIOGRAM;  Surgeon: Grace Isaac, MD;  Location: [place];  Service: Open Heart Surgery;  Laterality: N/A;  . KNEE ARTHROSCOPY     bilateral  . LEFT HEART CATHETERIZATION WITH CORONARY ANGIOGRAM N/A 11/25/2012   Procedure: LEFT HEART CATHETERIZATION WITH CORONARY ANGIOGRAM;  Surgeon:  Larey Dresser, MD;  Location: [facility] CATH LAB;  Service: Cardiovascular;  Laterality: N/A;  . LOOP RECORDER IMPLANT N/A 12/10/2013   Procedure: LOOP RECORDER IMPLANT;  Surgeon: Coralyn Mark, MD;  Location: [place] CATH LAB;  Service: Cardiovascular;  Laterality: N/A;  . NECK SURGERY       OB History   No obstetric history on file.      Home Medications    Prior to Admission medications   Medication Sig Start Date End Date Taking? Authorizing Provider  levothyroxine (SYNTHROID, LEVOTHROID) 112 MCG  tablet TAKE 1 TABLET ONCE DAILY BEFORE BREAKFAST Patient taking differently: Take 112 mcg by mouth daily before breakfast.  11/30/16  Yes Timmothy Euler, MD  metFORMIN (GLUCOPHAGE) 1000 MG tablet TAKE (1) TABLET TWICE A DAY WITH MEALS (BREAKFAST AND SUPPER) Patient taking differently: Take 1,000 mg by mouth 2 (two) times daily with a meal.  05/21/17  Yes Timmothy Euler, MD  rosuvastatin (CRESTOR) 20 MG tablet TAKE 1 TABLET DAILY Patient taking differently: Take 20 mg by mouth daily.  05/21/17  Yes Timmothy Euler, MD    Family History Family History  Problem Relation Age of Onset  . Ovarian cancer Mother   . Diabetes Father   . Pneumonia Father   . Diabetes Sister   . Stroke Daughter   . Heart attack Sister   . Heart attack Brother     Social History Social History   Tobacco Use  . Smoking status: Never Smoker  . Smokeless tobacco: Never Used  Substance Use Topics  . Alcohol use: No  . Drug use: No     Allergies   Celebrex [celecoxib]; Fenofibrate; and Statins   Review of Systems Review of Systems  Constitutional: Negative for appetite change and fatigue.  HENT: Negative for congestion, ear discharge and sinus pressure.   Eyes: Negative for discharge.  Respiratory: Positive for shortness of breath. Negative for cough.   Cardiovascular: Negative for chest pain.  Gastrointestinal: Negative for abdominal pain and diarrhea.  Genitourinary: Negative for frequency and hematuria.  Musculoskeletal: Negative for back pain.  Skin: Negative for rash.  Neurological: Positive for weakness. Negative for seizures and headaches.  Psychiatric/Behavioral: Negative for hallucinations.     Physical Exam Updated Vital Signs BP 139/80   Pulse 85   Temp 99.1 F (37.3 C) (Oral)   Resp (!) 27   Ht 5' (1.524 m) Comment: Simultaneous filing. User may not have seen previous data.  Wt 80 kg Comment: Simultaneous filing. User may not have seen previous data.  SpO2 99%   BMI  34.44 kg/m   Physical Exam Vitals signs and nursing note reviewed.  Constitutional:      Appearance: Normal appearance. She is well-developed.  HENT:     Head: Normocephalic.     Nose: Nose normal.  Eyes:     General: No scleral icterus.    Conjunctiva/sclera: Conjunctivae normal.     Pupils: Pupils are equal, round, and reactive to light.  Neck:     Musculoskeletal: Neck supple.     Thyroid: No thyromegaly.  Cardiovascular:     Rate and Rhythm: Normal rate and regular rhythm.     Heart sounds: No murmur. No friction rub. No gallop.   Pulmonary:     Breath sounds: No stridor. No wheezing or rales.  Chest:     Chest wall: No tenderness.  Abdominal:     General: There is no distension.     Tenderness: There is  no abdominal tenderness. There is no rebound.  Musculoskeletal: Normal range of motion.     Comments: General weakness  Lymphadenopathy:     Cervical: No cervical adenopathy.  Skin:    Findings: No erythema or rash.  Neurological:     Mental Status: She is alert and oriented to person, place, and time.     Motor: No abnormal muscle tone.     Coordination: Coordination normal.  Psychiatric:        Behavior: Behavior normal.      ED Treatments / Results  Labs (all labs ordered are listed, but only abnormal results are displayed) Labs Reviewed  CBC WITH DIFFERENTIAL/PLATELET - Abnormal; Notable for the following components:      Result Value   WBC 10.8 (*)    RBC 5.22 (*)    Neutro Abs 8.8 (*)    All other components within normal limits  COMPREHENSIVE METABOLIC PANEL - Abnormal; Notable for the following components:   Sodium 134 (*)    Glucose, Bld 255 (*)    Creatinine, Ser 1.25 (*)    Calcium 8.8 (*)    GFR calc non Af Amer 40 (*)    GFR calc Af Amer 46 (*)    All other components within normal limits  D-DIMER, QUANTITATIVE (NOT AT [facility]) - Abnormal; Notable for the following components:   D-Dimer, Quant 1.03 (*)    All other components within normal  limits  URINALYSIS, ROUTINE W REFLEX MICROSCOPIC - Abnormal; Notable for the following components:   APPearance HAZY (*)    Glucose, UA 50 (*)    Nitrite POSITIVE (*)    Leukocytes,Ua SMALL (*)    Bacteria, UA MANY (*)    All other components within normal limits  SARS CORONAVIRUS 2 (HOSPITAL ORDER, [place] [facility])  URINE CULTURE  TROPONIN I  BRAIN NATRIURETIC PEPTIDE    EKG None  Radiology Ct Angio Chest Pe W And/or Wo Contrast  Result Date: 08/30/2018 CLINICAL DATA:  85 year old female with history of shortness of breath. EXAM: CT ANGIOGRAPHY CHEST WITH CONTRAST TECHNIQUE: Multidetector CT imaging of the chest was performed using the standard protocol during bolus administration of intravenous contrast. Multiplanar CT image reconstructions and MIPs were obtained to evaluate the vascular anatomy. CONTRAST:  75 mL of Omnipaque 350. COMPARISON:  Chest CT 02/21/2005. FINDINGS: Cardiovascular: No filling defects within the pulmonary arterial tree to suggest underlying pulmonary embolism. Heart size is normal. There is no significant pericardial fluid, thickening or pericardial calcification. There is aortic atherosclerosis, as well as atherosclerosis of the great vessels of the mediastinum and the coronary arteries, including calcified atherosclerotic plaque in the left main, left anterior descending, left circumflex and right coronary arteries. Status post median sternotomy for CABG including LIMA to the LAD. Mediastinum/Nodes: No pathologically enlarged mediastinal or hilar lymph nodes. Esophagus is unremarkable in appearance. No axillary lymphadenopathy. Lungs/Pleura: Large calcified granuloma in the left upper lobe measuring 1 cm. No other suspicious appearing pulmonary nodules or masses are noted. No acute consolidative airspace disease. No pleural effusions. Upper Abdomen: Aortic atherosclerosis. Several calcified granulomas throughout the spleen. Musculoskeletal: Median  sternotomy wires. There are no aggressive appearing lytic or blastic lesions noted in the visualized portions of the skeleton. Review of the MIP images confirms the above findings. IMPRESSION: 1. No evidence of pulmonary embolism. 2. No acute findings in the thorax to account for the patient's symptoms. 3. Aortic atherosclerosis, in addition to left main and 3 vessel coronary artery  disease. Status post median sternotomy for CABG including LIMA to the LAD. 4. Old granulomatous disease, as above. Aortic Atherosclerosis (ICD10-I70.0). Electronically Signed   By: Vinnie Langton M.D.   On: 08/30/2018 21:49   Dg Chest Port 1 View  Result Date: 08/30/2018 CLINICAL DATA:  85 year old female with history of shortness of breath. EXAM: PORTABLE CHEST 1 VIEW COMPARISON:  Chest x-ray 06/04/2018. FINDINGS: Lung volumes are low. No consolidative airspace disease. No pleural effusions. No pneumothorax. Small calcified granuloma in the left mid lung, unchanged. No other suspicious pulmonary nodule or mass noted. Pulmonary vasculature and the cardiomediastinal silhouette are within normal limits. Aortic atherosclerosis. Status post median sternotomy for CABG. Cerclage wire seen projecting over the lower cervical region incidentally noted. Electronic device projecting over the cardiac ventricles, potentially an implanted pacemaker. IMPRESSION: 1. Low lung volumes without radiographic evidence of acute cardiopulmonary disease. 2. Aortic atherosclerosis. 3. Postoperative changes, as above. Electronically Signed   By: Vinnie Langton M.D.   On: 08/30/2018 20:17    Procedures Procedures (including critical care time)  Medications Ordered in ED Medications  cefTRIAXone (ROCEPHIN) 1 g in sodium chloride 0.9 % 100 mL IVPB (0 g Intravenous Stopped 08/30/18 2114)  iohexol (OMNIPAQUE) 350 MG/ML injection 75 mL ( Intravenous Contrast Given 08/30/18 2130)     Initial Impression / Assessment and Plan / ED Course  I have reviewed  the triage vital signs and the nursing notes.  Pertinent labs & imaging results that were available during my care of the patient were reviewed by me and considered in my medical decision making (see chart for details).       Labs unremarkable except for elevated d-dimer and urinalysis consistent with UTI.  CT scan looking for PE was negative.  Patient has urinary tract infection and weakness secondary to that she will be admitted for IV antibiotics  Final Clinical Impressions(s) / ED Diagnoses   Final diagnoses:  SOB (shortness of breath)    ED Discharge Orders    None       Milton Ferguson, MD 08/30/18 2242

## 2018-08-30 NOTE — ED Notes (Signed)
Call to [facility], [place]

## 2018-08-30 NOTE — ED Triage Notes (Signed)
Pt states she has been short of breath for awhile  - when asked weeks? Months? She repolies she does not know   Family called EMS who started O2 4L unable to start IV  Pt with Hx CVA x2, CABG

## 2018-08-30 NOTE — ED Notes (Signed)
From CT 

## 2018-08-30 NOTE — ED Notes (Signed)
Hinton Dyer, pharm in to assess meds

## 2018-08-31 ENCOUNTER — Encounter (HOSPITAL_COMMUNITY): Payer: Self-pay | Admitting: Internal Medicine

## 2018-08-31 DIAGNOSIS — I2510 Atherosclerotic heart disease of native coronary artery without angina pectoris: Secondary | ICD-10-CM | POA: Diagnosis not present

## 2018-08-31 DIAGNOSIS — E871 Hypo-osmolality and hyponatremia: Secondary | ICD-10-CM | POA: Diagnosis not present

## 2018-08-31 DIAGNOSIS — N183 Chronic kidney disease, stage 3 (moderate): Secondary | ICD-10-CM | POA: Diagnosis not present

## 2018-08-31 DIAGNOSIS — N390 Urinary tract infection, site not specified: Secondary | ICD-10-CM | POA: Diagnosis not present

## 2018-08-31 DIAGNOSIS — E1122 Type 2 diabetes mellitus with diabetic chronic kidney disease: Secondary | ICD-10-CM | POA: Diagnosis not present

## 2018-08-31 LAB — CBC WITH DIFFERENTIAL/PLATELET
Abs Immature Granulocytes: 0.02 10*3/uL (ref 0.00–0.07)
Basophils Absolute: 0 10*3/uL (ref 0.0–0.1)
Basophils Relative: 1 %
Eosinophils Absolute: 0.1 10*3/uL (ref 0.0–0.5)
Eosinophils Relative: 1 %
HCT: 40.4 % (ref 36.0–46.0)
Hemoglobin: 13.1 g/dL (ref 12.0–15.0)
Immature Granulocytes: 0 %
Lymphocytes Relative: 16 %
Lymphs Abs: 1 10*3/uL (ref 0.7–4.0)
MCH: 26.7 pg (ref 26.0–34.0)
MCHC: 32.4 g/dL (ref 30.0–36.0)
MCV: 82.4 fL (ref 80.0–100.0)
Monocytes Absolute: 0.4 10*3/uL (ref 0.1–1.0)
Monocytes Relative: 7 %
Neutro Abs: 4.8 10*3/uL (ref 1.7–7.7)
Neutrophils Relative %: 75 %
Platelets: 192 10*3/uL (ref 150–400)
RBC: 4.9 MIL/uL (ref 3.87–5.11)
RDW: 15.3 % (ref 11.5–15.5)
WBC: 6.3 10*3/uL (ref 4.0–10.5)
nRBC: 0 % (ref 0.0–0.2)

## 2018-08-31 LAB — COMPREHENSIVE METABOLIC PANEL
ALT: 11 U/L (ref 0–44)
AST: 14 U/L — ABNORMAL LOW (ref 15–41)
Albumin: 3.4 g/dL — ABNORMAL LOW (ref 3.5–5.0)
Alkaline Phosphatase: 53 U/L (ref 38–126)
Anion gap: 8 (ref 5–15)
BUN: 14 mg/dL (ref 8–23)
CO2: 27 mmol/L (ref 22–32)
Calcium: 8.7 mg/dL — ABNORMAL LOW (ref 8.9–10.3)
Chloride: 102 mmol/L (ref 98–111)
Creatinine, Ser: 0.91 mg/dL (ref 0.44–1.00)
GFR calc Af Amer: >60 mL/min (ref >60)
GFR calc non Af Amer: 58 mL/min — ABNORMAL LOW (ref >60)
Glucose, Bld: 208 mg/dL — ABNORMAL HIGH (ref 70–99)
Potassium: 4.1 mmol/L (ref 3.5–5.1)
Sodium: 137 mmol/L (ref 135–145)
Total Bilirubin: 0.7 mg/dL (ref 0.3–1.2)
Total Protein: 6.7 g/dL (ref 6.5–8.1)

## 2018-08-31 LAB — GLUCOSE, CAPILLARY
Glucose-Capillary: 174 mg/dL — ABNORMAL HIGH (ref 70–99)
Glucose-Capillary: 191 mg/dL — ABNORMAL HIGH (ref 70–99)
Glucose-Capillary: 207 mg/dL — ABNORMAL HIGH (ref 70–99)
Glucose-Capillary: 257 mg/dL — ABNORMAL HIGH (ref 70–99)
Glucose-Capillary: 299 mg/dL — ABNORMAL HIGH (ref 70–99)

## 2018-08-31 LAB — PHOSPHORUS: Phosphorus: 3.8 mg/dL (ref 2.5–4.6)

## 2018-08-31 LAB — HEMOGLOBIN A1C
Hgb A1c MFr Bld: 9.8 % — ABNORMAL HIGH (ref 4.8–5.6)
Mean Plasma Glucose: 234.56 mg/dL

## 2018-08-31 LAB — MAGNESIUM: Magnesium: 2.1 mg/dL (ref 1.7–2.4)

## 2018-08-31 MED ORDER — ROSUVASTATIN CALCIUM 20 MG PO TABS
20.0000 mg | ORAL_TABLET | Freq: Every day | ORAL | Status: DC
  Administered 2018-08-31 – 2018-09-03 (×4): 20 mg via ORAL
  Filled 2018-08-31 (×4): qty 1

## 2018-08-31 MED ORDER — LEVOTHYROXINE SODIUM 112 MCG PO TABS
112.0000 ug | ORAL_TABLET | Freq: Every day | ORAL | Status: DC
  Administered 2018-08-31 – 2018-09-03 (×4): 112 ug via ORAL
  Filled 2018-08-31 (×4): qty 1

## 2018-08-31 MED ORDER — ASPIRIN EC 81 MG PO TBEC
81.0000 mg | DELAYED_RELEASE_TABLET | Freq: Once | ORAL | Status: AC
  Administered 2018-08-31: 81 mg via ORAL
  Filled 2018-08-31: qty 1

## 2018-08-31 MED ORDER — METFORMIN HCL 500 MG PO TABS
1000.0000 mg | ORAL_TABLET | Freq: Two times a day (BID) | ORAL | Status: DC
  Administered 2018-08-31 – 2018-09-03 (×8): 1000 mg via ORAL
  Filled 2018-08-31 (×8): qty 2

## 2018-08-31 MED ORDER — SODIUM CHLORIDE 0.9 % IV SOLN: INTRAVENOUS | Status: AC

## 2018-08-31 NOTE — H&P (Addendum)
TRH H&P    Patient Demographics:    Tamyka Bezio, is a 85 y.o. female  MRN: [id number]  DOB - 02/25/1933  Admit Date - 08/30/2018  Referring MD/NP/PA:  Denton Meek  Outpatient Primary MD for the patient is Dettinger, Fransisca Kaufmann, MD Jamey Ripa- neurology [facility]   Patient coming from:  home  Chief complaint- fever, weakness   HPI:    Kelsha Older  is a 85 y.o. female, w hypertension, hyperlipidemia, Dm2, CAD s/p CABG, h/o CVA with residual expressive aphasia, Jerrye Bushy, Hypothyroidism, B 12 deficiency apparently had fever 102 at home, and felt generally weak.  Pt notes that she has had increase in frequency of urination but denies dysuria/ hematuria, or flank pain.  There was some question of sob, but pt denies being sob presently.   Pt notes slight dry cough. Pt denies cp, palp, n/v, abd pain, diarrhea, brbpr, black stool.  Pt presented for evaluation of fever/ weakness (generalized)  In ED,  T 97.9  P 74  R 18  Bp 139/76  Pox 92-100% on RA Wt 80kg  CT brain IMPRESSION: 1. No acute intracranial hemorrhage. 2. Age-related atrophy and chronic microvascular ischemic changes.  CTA chest IMPRESSION: 1. No evidence of pulmonary embolism. 2. No acute findings in the thorax to account for the patient's symptoms. 3. Aortic atherosclerosis, in addition to left main and 3 vessel coronary artery disease. Status post median sternotomy for CABG including LIMA to the LAD. 4. Old granulomatous disease, as above.  Covid -19 negative  BNP 41 Wbc 10.8, Hgb 13.6, Plt 223 Na 134, K 4.3, Bun 18, Creatinine 1.25 Calcium 8.8, Glucose 255 Trop <0.03  Urinalysis  Wbc 21-50, rbc 0-5 N+, LE+  Pt will be admitted for fever secondary to acute UTI, ? Slight mild confusion from UTI, and mild hyponatremia.     Review of systems:    In addition to the HPI above,   No Headache, No changes with  Vision or hearing, No problems swallowing food or Liquids, No Chest pain,  No Abdominal pain, No Nausea or Vomiting, bowel movements are regular, No Blood in stool or Urine, No dysuria, No new skin rashes or bruises, No new joints pains-aches,  No  tingling, numbness in any extremity, No recent weight gain or loss, No polyuria, polydypsia or polyphagia, No significant Mental Stressors.  All other systems reviewed and are negative.    Past History of the following :    Past Medical History:  Diagnosis Date  . Allergy   . Anemia   . CAD (coronary artery disease) 11-2012   CABG x 4 utilizing LIMA to LAD, SVG to Diagonal, SVG to Left Circumflex, and SVG to RCA  . Diabetes mellitus type 2, controlled ([place])   . GERD (gastroesophageal reflux disease)   . Hearing loss   . Hyperlipidemia   . Hypertension    patient denies ever having hypertension  . Hypothyroidism   . Personal history of colonic polyps 11/20/2010   tubular adenomas  . Stroke ([place])   .  Tremor   . Vision abnormalities       Past Surgical History:  Procedure Laterality Date  . ABDOMINAL HYSTERECTOMY    . APPENDECTOMY    . BACK SURGERY    . CARPAL TUNNEL RELEASE    . CHOLECYSTECTOMY    . CORONARY ARTERY BYPASS GRAFT N/A 11/26/2012   Procedure: CORONARY ARTERY BYPASS GRAFTING (CABG);  Surgeon: Grace Isaac, MD;  Location: [facility];  Service: Open Heart Surgery;  Laterality: N/A;  . INTRAOPERATIVE TRANSESOPHAGEAL ECHOCARDIOGRAM N/A 11/26/2012   Procedure: INTRAOPERATIVE TRANSESOPHAGEAL ECHOCARDIOGRAM;  Surgeon: Grace Isaac, MD;  Location: [facility];  Service: Open Heart Surgery;  Laterality: N/A;  . KNEE ARTHROSCOPY     bilateral  . LEFT HEART CATHETERIZATION WITH CORONARY ANGIOGRAM N/A 11/25/2012   Procedure: LEFT HEART CATHETERIZATION WITH CORONARY ANGIOGRAM;  Surgeon: Larey Dresser, MD;  Location: [facility] CATH LAB;  Service: Cardiovascular;  Laterality: N/A;  . LOOP RECORDER IMPLANT N/A 12/10/2013   Procedure: LOOP  RECORDER IMPLANT;  Surgeon: Coralyn Mark, MD;  Location: [facility] CATH LAB;  Service: Cardiovascular;  Laterality: N/A;  . NECK SURGERY        Social History:      Social History   Tobacco Use  . Smoking status: Never Smoker  . Smokeless tobacco: Never Used  Substance Use Topics  . Alcohol use: No       Family History :     Family History  Problem Relation Age of Onset  . Ovarian cancer Mother   . Diabetes Father   . Pneumonia Father   . Diabetes Sister   . Stroke Daughter   . Heart attack Sister   . Heart attack Brother       Home Medications:   Prior to Admission medications   Medication Sig Start Date End Date Taking? Authorizing Provider  levothyroxine (SYNTHROID, LEVOTHROID) 112 MCG tablet TAKE 1 TABLET ONCE DAILY BEFORE BREAKFAST Patient taking differently: Take 112 mcg by mouth daily before breakfast.  11/30/16  Yes Timmothy Euler, MD  metFORMIN (GLUCOPHAGE) 1000 MG tablet TAKE (1) TABLET TWICE A DAY WITH MEALS (BREAKFAST AND SUPPER) Patient taking differently: Take 1,000 mg by mouth 2 (two) times daily with a meal.  05/21/17  Yes Timmothy Euler, MD  rosuvastatin (CRESTOR) 20 MG tablet TAKE 1 TABLET DAILY Patient taking differently: Take 20 mg by mouth daily.  05/21/17  Yes Timmothy Euler, MD     Allergies:     Allergies  Allergen Reactions  . Celebrex [Celecoxib] Other (See Comments)    Muscle aches and leg pains  . Fenofibrate Other (See Comments)    Muscle aches and leg pains  . Statins Other (See Comments)    Myalgias; muscle aches and leg pains (Atoravastatin, Simvastatin) - daugther does not recall Crestor trial, williing to try 06/21/16     Physical Exam:   Vitals  Blood pressure 139/76, pulse 74, temperature 97.9 F (36.6 C), temperature source Oral, resp. rate 19, height 5' (1.524 m), weight 80 kg, SpO2 100 %.  1.  General: axox1 (person), pt states 2021, and [place]  ? Due to expressive aphasia from prior CVA  2.  Psychiatric: euthymic  3. Neurologic: cn2-12 intact, reflexes 2+ symmetric, diffuse with no clonus, motor 5/5 in all 4 ext  4. HEENMT:  Anicteric, pupils 1.77mm symmetric, direct, consensual, near intact Mucous membranes slightly dry Neck: no jvd, no bruit, no tm  5. Respiratory : CTAB  6. Cardiovascular : Midline scar,  rrr s1, s2, no m/g/r  7. Gastrointestinal:  Abd: soft, obese, nt, nd, +bs  8. Skin:  Ext: no c/c/e,  No rash, + hammer toes and onychomycosis  9.Musculoskeletal:  Good ROM  No adenopathy    Data Review:    CBC Recent Labs  Lab 08/30/18 1905  WBC 10.8*  HGB 13.6  HCT 42.2  PLT 223  MCV 80.8  MCH 26.1  MCHC 32.2  RDW 15.1  LYMPHSABS 1.2  MONOABS 0.7  EOSABS 0.1  BASOSABS 0.0   ------------------------------------------------------------------------------------------------------------------  Results for orders placed or performed during the hospital encounter of 08/30/18 (from the past 48 hour(s))  CBC with Differential/Platelet     Status: Abnormal   Collection Time: 08/30/18  7:05 PM  Result Value Ref Range   WBC 10.8 (H) 4.0 - 10.5 K/uL   RBC 5.22 (H) 3.87 - 5.11 MIL/uL   Hemoglobin 13.6 12.0 - 15.0 g/dL   HCT 42.2 36.0 - 46.0 %   MCV 80.8 80.0 - 100.0 fL   MCH 26.1 26.0 - 34.0 pg   MCHC 32.2 30.0 - 36.0 g/dL   RDW 15.1 11.5 - 15.5 %   Platelets 223 150 - 400 K/uL   nRBC 0.0 0.0 - 0.2 %   Neutrophils Relative % 80 %   Neutro Abs 8.8 (H) 1.7 - 7.7 K/uL   Lymphocytes Relative 11 %   Lymphs Abs 1.2 0.7 - 4.0 K/uL   Monocytes Relative 7 %   Monocytes Absolute 0.7 0.1 - 1.0 K/uL   Eosinophils Relative 1 %   Eosinophils Absolute 0.1 0.0 - 0.5 K/uL   Basophils Relative 0 %   Basophils Absolute 0.0 0.0 - 0.1 K/uL   Immature Granulocytes 1 %   Abs Immature Granulocytes 0.05 0.00 - 0.07 K/uL    Comment: Performed at [facility], [address] [place], [place] [address]  Comprehensive metabolic panel     Status: Abnormal   Collection  Time: 08/30/18  7:05 PM  Result Value Ref Range   Sodium 134 (L) 135 - 145 mmol/L   Potassium 4.3 3.5 - 5.1 mmol/L   Chloride 98 98 - 111 mmol/L   CO2 24 22 - 32 mmol/L   Glucose, Bld 255 (H) 70 - 99 mg/dL   BUN 18 8 - 23 mg/dL   Creatinine, Ser 1.25 (H) 0.44 - 1.00 mg/dL   Calcium 8.8 (L) 8.9 - 10.3 mg/dL   Total Protein 7.6 6.5 - 8.1 g/dL   Albumin 3.7 3.5 - 5.0 g/dL   AST 19 15 - 41 U/L   ALT 13 0 - 44 U/L   Alkaline Phosphatase 63 38 - 126 U/L   Total Bilirubin 0.8 0.3 - 1.2 mg/dL   GFR calc non Af Amer 40 (L) >60 mL/min   GFR calc Af Amer 46 (L) >60 mL/min   Anion gap 12 5 - 15    Comment: Performed at [facility] [address], [place], [place] [address]  D-dimer, quantitative (not at [facility])     Status: Abnormal   Collection Time: 08/30/18  7:05 PM  Result Value Ref Range   D-Dimer, Quant 1.03 (H) 0.00 - 0.50 ug/mL-FEU    Comment: (NOTE) At the manufacturer cut-off of 0.50 ug/mL FEU, this assay has been documented to exclude PE with a sensitivity and negative predictive value of 97 to 99%.  At this time, this assay has not been approved by the FDA to exclude DVT/VTE. Results should be correlated with  clinical presentation. Performed at [facility], [address], [place], [place] [address]   Troponin I - ONCE - STAT     Status: None   Collection Time: 08/30/18  7:05 PM  Result Value Ref Range   Troponin I <0.03 <0.03 ng/mL    Comment: Performed at [facility], [address] [place], [place] [address]  Brain natriuretic peptide     Status: None   Collection Time: 08/30/18  7:05 PM  Result Value Ref Range   B Natriuretic Peptide 41.0 0.0 - 100.0 pg/mL    Comment: Performed at [facility], [address], [place], [place] [address]  Urinalysis, Routine w reflex microscopic     Status: Abnormal   Collection Time: 08/30/18  7:42 PM  Result Value Ref Range   Color, Urine YELLOW YELLOW   APPearance HAZY (A) CLEAR   Specific Gravity, Urine 1.017 1.005 - 1.030   pH  5.0 5.0 - 8.0   Glucose, UA 50 (A) NEGATIVE mg/dL   Hgb urine dipstick NEGATIVE NEGATIVE   Bilirubin Urine NEGATIVE NEGATIVE   Ketones, ur NEGATIVE NEGATIVE mg/dL   Protein, ur NEGATIVE NEGATIVE mg/dL   Nitrite POSITIVE (A) NEGATIVE   Leukocytes,Ua SMALL (A) NEGATIVE   RBC / HPF 0-5 0 - 5 RBC/hpf   WBC, UA 21-50 0 - 5 WBC/hpf   Bacteria, UA MANY (A) NONE SEEN   Squamous Epithelial / LPF 0-5 0 - 5   Mucus PRESENT     Comment: Performed at [facility], [address], [place], [place] [address]  SARS Coronavirus 2 (CEPHEID- Performed in [facility] hospital lab), Hosp Order     Status: None   Collection Time: 08/30/18  9:00 PM  Result Value Ref Range   SARS Coronavirus 2 NEGATIVE NEGATIVE    Comment: (NOTE) If result is NEGATIVE SARS-CoV-2 target nucleic acids are NOT DETECTED. The SARS-CoV-2 RNA is generally detectable in upper and lower  respiratory specimens during the acute phase of infection. The lowest  concentration of SARS-CoV-2 viral copies this assay can detect is 250  copies / mL. A negative result does not preclude SARS-CoV-2 infection  and should not be used as the sole basis for treatment or other  patient management decisions.  A negative result may occur with  improper specimen collection / handling, submission of specimen other  than nasopharyngeal swab, presence of viral mutation(s) within the  areas targeted by this assay, and inadequate number of viral copies  (<250 copies / mL). A negative result must be combined with clinical  observations, patient history, and epidemiological information. If result is POSITIVE SARS-CoV-2 target nucleic acids are DETECTED. The SARS-CoV-2 RNA is generally detectable in upper and lower  respiratory specimens dur ing the acute phase of infection.  Positive  results are indicative of active infection with SARS-CoV-2.  Clinical  correlation with patient history and other diagnostic information is  necessary to determine patient  infection status.  Positive results do  not rule out bacterial infection or co-infection with other viruses. If result is PRESUMPTIVE POSTIVE SARS-CoV-2 nucleic acids MAY BE PRESENT.   A presumptive positive result was obtained on the submitted specimen  and confirmed on repeat testing.  While 2019 novel coronavirus  (SARS-CoV-2) nucleic acids may be present in the submitted sample  additional confirmatory testing may be necessary for epidemiological  and / or clinical management purposes  to differentiate between  SARS-CoV-2 and other Sarbecovirus currently known to infect humans.  If clinically indicated  additional testing with an alternate test  methodology [phone]) is advised. The SARS-CoV-2 RNA is generally  detectable in upper and lower respiratory sp ecimens during the acute  phase of infection. The expected result is Negative. Fact Sheet for Patients:  StrictlyIdeas.no Fact Sheet for Healthcare Providers: BankingDealers.co.za This test is not yet approved or cleared by the [place] FDA and has been authorized for detection and/or diagnosis of SARS-CoV-2 by FDA under an Emergency Use Authorization (EUA).  This EUA will remain in effect (meaning this test can be used) for the duration of the COVID-19 declaration under Section 564(b)(1) of the Act, 21 U.S.C. section 360bbb-3(b)(1), unless the authorization is terminated or revoked sooner. Performed at [facility], [address], [place], [place] [address]   Glucose, capillary     Status: Abnormal   Collection Time: 08/31/18 12:12 AM  Result Value Ref Range   Glucose-Capillary 299 (H) 70 - 99 mg/dL    Chemistries  Recent Labs  Lab 08/30/18 1905  NA 134*  K 4.3  CL 98  CO2 24  GLUCOSE 255*  BUN 18  CREATININE 1.25*  CALCIUM 8.8*  AST 19  ALT 13  ALKPHOS 63  BILITOT 0.8    ------------------------------------------------------------------------------------------------------------------  ------------------------------------------------------------------------------------------------------------------ GFR: Estimated Creatinine Clearance: 31.9 mL/min (A) (by C-G formula based on SCr of 1.25 mg/dL (H)). Liver Function Tests: Recent Labs  Lab 08/30/18 1905  AST 19  ALT 13  ALKPHOS 63  BILITOT 0.8  PROT 7.6  ALBUMIN 3.7   No results for input(s): LIPASE, AMYLASE in the last 168 hours. No results for input(s): AMMONIA in the last 168 hours. Coagulation Profile: No results for input(s): INR, PROTIME in the last 168 hours. Cardiac Enzymes: Recent Labs  Lab 08/30/18 1905  TROPONINI <0.03   BNP (last 3 results) No results for input(s): PROBNP in the last 8760 hours. HbA1C: No results for input(s): HGBA1C in the last 72 hours. CBG: Recent Labs  Lab 08/31/18 0012  GLUCAP 299*   Lipid Profile: No results for input(s): CHOL, HDL, LDLCALC, TRIG, CHOLHDL, LDLDIRECT in the last 72 hours. Thyroid Function Tests: No results for input(s): TSH, T4TOTAL, FREET4, T3FREE, THYROIDAB in the last 72 hours. Anemia Panel: No results for input(s): VITAMINB12, FOLATE, FERRITIN, TIBC, IRON, RETICCTPCT in the last 72 hours.  --------------------------------------------------------------------------------------------------------------- Urine analysis:    Component Value Date/Time   COLORURINE YELLOW 08/30/2018 1942   APPEARANCEUR HAZY (A) 08/30/2018 1942   APPEARANCEUR Clear 08/10/2016 1357   LABSPEC 1.017 08/30/2018 1942   PHURINE 5.0 08/30/2018 1942   GLUCOSEU 50 (A) 08/30/2018 1942   HGBUR NEGATIVE 08/30/2018 1942   BILIRUBINUR NEGATIVE 08/30/2018 1942   BILIRUBINUR Negative 08/10/2016 1357   KETONESUR NEGATIVE 08/30/2018 1942   PROTEINUR NEGATIVE 08/30/2018 1942   UROBILINOGEN negative 04/05/2015 1147   UROBILINOGEN 0.2 12/09/2013 1820   NITRITE  POSITIVE (A) 08/30/2018 1942   LEUKOCYTESUR SMALL (A) 08/30/2018 1942      Imaging Results:    Ct Head Wo Contrast  Result Date: 08/30/2018 CLINICAL DATA:  85 year old female with generalized weakness and altered mental status. EXAM: CT HEAD WITHOUT CONTRAST TECHNIQUE: Contiguous axial images were obtained from the base of the skull through the vertex without intravenous contrast. COMPARISON:  Head CT dated 06/20/2016 and MRI dated 06/04/2018 FINDINGS: Brain: There is moderate age-related atrophy and chronic microvascular ischemic changes. There is no acute intracranial hemorrhage. No mass effect or midline shift. No extra-axial fluid collection. Vascular: No hyperdense vessel or unexpected calcification. Skull: Normal. Negative  for fracture or focal lesion. Sinuses/Orbits: No acute finding. Other: None IMPRESSION: 1. No acute intracranial hemorrhage. 2. Age-related atrophy and chronic microvascular ischemic changes. Electronically Signed   By: Anner Crete M.D.   On: 08/30/2018 23:48   Ct Angio Chest Pe W And/or Wo Contrast  Result Date: 08/30/2018 CLINICAL DATA:  85 year old female with history of shortness of breath. EXAM: CT ANGIOGRAPHY CHEST WITH CONTRAST TECHNIQUE: Multidetector CT imaging of the chest was performed using the standard protocol during bolus administration of intravenous contrast. Multiplanar CT image reconstructions and MIPs were obtained to evaluate the vascular anatomy. CONTRAST:  75 mL of Omnipaque 350. COMPARISON:  Chest CT 02/21/2005. FINDINGS: Cardiovascular: No filling defects within the pulmonary arterial tree to suggest underlying pulmonary embolism. Heart size is normal. There is no significant pericardial fluid, thickening or pericardial calcification. There is aortic atherosclerosis, as well as atherosclerosis of the great vessels of the mediastinum and the coronary arteries, including calcified atherosclerotic plaque in the left main, left anterior descending, left  circumflex and right coronary arteries. Status post median sternotomy for CABG including LIMA to the LAD. Mediastinum/Nodes: No pathologically enlarged mediastinal or hilar lymph nodes. Esophagus is unremarkable in appearance. No axillary lymphadenopathy. Lungs/Pleura: Large calcified granuloma in the left upper lobe measuring 1 cm. No other suspicious appearing pulmonary nodules or masses are noted. No acute consolidative airspace disease. No pleural effusions. Upper Abdomen: Aortic atherosclerosis. Several calcified granulomas throughout the spleen. Musculoskeletal: Median sternotomy wires. There are no aggressive appearing lytic or blastic lesions noted in the visualized portions of the skeleton. Review of the MIP images confirms the above findings. IMPRESSION: 1. No evidence of pulmonary embolism. 2. No acute findings in the thorax to account for the patient's symptoms. 3. Aortic atherosclerosis, in addition to left main and 3 vessel coronary artery disease. Status post median sternotomy for CABG including LIMA to the LAD. 4. Old granulomatous disease, as above. Aortic Atherosclerosis (ICD10-I70.0). Electronically Signed   By: Vinnie Langton M.D.   On: 08/30/2018 21:49   Dg Chest Port 1 View  Result Date: 08/30/2018 CLINICAL DATA:  85 year old female with history of shortness of breath. EXAM: PORTABLE CHEST 1 VIEW COMPARISON:  Chest x-ray 06/04/2018. FINDINGS: Lung volumes are low. No consolidative airspace disease. No pleural effusions. No pneumothorax. Small calcified granuloma in the left mid lung, unchanged. No other suspicious pulmonary nodule or mass noted. Pulmonary vasculature and the cardiomediastinal silhouette are within normal limits. Aortic atherosclerosis. Status post median sternotomy for CABG. Cerclage wire seen projecting over the lower cervical region incidentally noted. Electronic device projecting over the cardiac ventricles, potentially an implanted pacemaker. IMPRESSION: 1. Low lung  volumes without radiographic evidence of acute cardiopulmonary disease. 2. Aortic atherosclerosis. 3. Postoperative changes, as above. Electronically Signed   By: Vinnie Langton M.D.   On: 08/30/2018 20:17    ekg st at 100, LAD, nl pr, nl qtc, early R progression   Assessment & Plan:    Principal Problem:   Acute lower UTI Active Problems:   Type 2 diabetes mellitus with renal manifestations (HCC)   Coronary artery disease   Hyponatremia   Acute UTI  Fever, Acute lower UTI (prior urine culture 2018, pansensitive E. Coli) Urine culture Blood culture x2 not collected since pt already received abx in ED Rocephin 1gm iv qday  Dyspnea, unclear etiology Check cardiac echo Consider PFT as outpatient  Dm2 w h/o CKD stage3 Cont Metformin 1000mg  po bid fsbs ac and qhs, ISS  Hypothyroidism Cont Levothyroxine  H/o CAD s/p CABG 2014 H/o lacunar CVA on prior MRI brain Cont Crestor 20mg  po qhs Aspirin 81mg  po x1 for now Unclear if patient is taking aspirin, please discuss with patient in Am  Dehydration, mild renal insufficiency Hydrate with ns at 36mL per hour x 8 hours Check cmp in am  Hyponatremia, (pseudohyponatremia) Check cmp in am  H/o B12 deficiency per neurology outpatient notes Please discharge on b12   DVT Prophylaxis-   Lovenox - SCDs   AM Labs Ordered, also please review Full Orders  Family Communication: Admission, patients condition and plan of care including tests being ordered have been discussed with the patient and her sister who indicate understanding and agree with the plan and Code Status.  Code Status:  FULL CODE  Admission status: Observation/: Based on patients clinical presentation and evaluation of above clinical data, I have made determination that patient meets observation  criteria at this time. Pt has had fever, and acute lower uti. Pt given rocephin 1gm iv qday in ED, pt has dehydration and mild renal insufficiency and received iv dye load  from CTA chest in ED.  Pt will require gentle hydration and thus needs hospitalization for IVF, if not improving may require inpatient stay  Time spent in minutes : 70   Jani Gravel M.D on 08/31/2018 at 12:28 AM

## 2018-08-31 NOTE — Progress Notes (Signed)
The patient was admitted early this morning after midnight and H&P has been reviewed and I am in current agreement with assessment and plan done by Dr. Jani Gravel.  Additional changes to the plan of care been made accordingly including repeat blood work this morning.  The patient is an 85 year old Caucasian female with a past medical history significant for but not limited to hypertension, hyperlipidemia, diabetes mellitus type 2, CAD status post CABG, history of CVA with residual expressive aphasia, GERD, hypothyroidism, vitamin B12 deficiency and other comorbidities who presented to the ED with a chief complaint of a fever of 102 at home and generally feeling weak.  She notes that she had increased frequency of urination but denies any dysuria hematuria or flank pain.  Initially there was some concern of some shortness of breath but patient denies being short of breath presently and does have a slight dry cough.  She underwent further work-up and had a CT of her brain done which showed no acute intracranial hemorrhage and age-related atrophy and chronic microvascular ischemic changes.  CTA of the chest was done and showed no evidence of PE and there is no acute findings in the patient's thorax or account for patient's symptoms.  With 19 test was negative and admission she had a BNP of 41.  WBC on admission was 10.8 with a sodium of 134 potassium of 4.3 and a creatinine 1.25.  Urinalysis showed 21-50 WBCs and 0-5 RBCs but there is many bacteria present as well as positive nitrites and small leukocytes.  Urine culture still pending however we will add on blood cultures as they were not done in the ED. was admitted for fever and generalized weakness likely secondary to suspected UTI along with slight confusion likely from UTI and mild hyponatremia.  She is currently being treated for the following:  Fever, Acute lower UTI (prior urine culture 2018, pansensitive E. Coli) -Follow Urine culture -Blood culture x2 not  collected since pt already received abx in ED but will order one now -Rocephin 1gm iv qday  Dyspnea, unclear etiology -Check cardiac echo -Consider PFT as outpatient -CTA Negative  Dm2 w h/o CKD stage3 -Cont Metformin 1000mg  po bid -fsbs ac and qhs, ISS -CBG's ranging from 207-299  Hypothyroidism -Cont Levothyroxine  H/o CAD s/p CABG 2014 H/o lacunar CVA on prior MRI brain -Cont Crestor 20mg  po qhs -Aspirin 81mg  po x1 for now -Unclear if patient is taking aspirin, and will discuss with the patient if she is this AM  Dehydration, mild renal insufficiency -Hydrate with ns at 61mL per hour x 8 hours and renewed for another 8 hours -BUN/Cr was 18/1.25 on Admission -Avoid Nephrotoxic Medications, Contrast Dyes if possible, and Hypotension  -Check cmp in am  Hyponatremia, (pseudohyponatremia) -CMP this AM is pending  H/o B12 deficiency per neurology outpatient notes -Will discharge on b12 when ready for D/C  Obesity -Estimated body mass index is 32.51 kg/m as calculated from the following:   Height as of this encounter: 5' (1.524 m).   Weight as of this encounter: 75.5 kg. -Weight Loss and Dietary Counseling given   We will continue to monitor patient's clinical response to intervention and repeat blood work in the a.m. as well as obtain a physical therapy and Occupational Therapy consultation.

## 2018-09-01 ENCOUNTER — Observation Stay (HOSPITAL_BASED_OUTPATIENT_CLINIC_OR_DEPARTMENT_OTHER): Payer: Medicare Other

## 2018-09-01 DIAGNOSIS — E871 Hypo-osmolality and hyponatremia: Secondary | ICD-10-CM | POA: Diagnosis not present

## 2018-09-01 DIAGNOSIS — I2510 Atherosclerotic heart disease of native coronary artery without angina pectoris: Secondary | ICD-10-CM | POA: Diagnosis not present

## 2018-09-01 DIAGNOSIS — E1122 Type 2 diabetes mellitus with diabetic chronic kidney disease: Secondary | ICD-10-CM | POA: Diagnosis not present

## 2018-09-01 DIAGNOSIS — R0609 Other forms of dyspnea: Secondary | ICD-10-CM

## 2018-09-01 DIAGNOSIS — N390 Urinary tract infection, site not specified: Secondary | ICD-10-CM | POA: Diagnosis not present

## 2018-09-01 DIAGNOSIS — A498 Other bacterial infections of unspecified site: Secondary | ICD-10-CM | POA: Diagnosis not present

## 2018-09-01 LAB — CBC WITH DIFFERENTIAL/PLATELET
Abs Immature Granulocytes: 0.02 10*3/uL (ref 0.00–0.07)
Basophils Absolute: 0 10*3/uL (ref 0.0–0.1)
Basophils Relative: 0 %
Eosinophils Absolute: 0.2 10*3/uL (ref 0.0–0.5)
Eosinophils Relative: 3 %
HCT: 42.2 % (ref 36.0–46.0)
Hemoglobin: 13.4 g/dL (ref 12.0–15.0)
Immature Granulocytes: 0 %
Lymphocytes Relative: 30 %
Lymphs Abs: 1.7 10*3/uL (ref 0.7–4.0)
MCH: 26.4 pg (ref 26.0–34.0)
MCHC: 31.8 g/dL (ref 30.0–36.0)
MCV: 83.2 fL (ref 80.0–100.0)
Monocytes Absolute: 0.4 10*3/uL (ref 0.1–1.0)
Monocytes Relative: 8 %
Neutro Abs: 3.2 10*3/uL (ref 1.7–7.7)
Neutrophils Relative %: 59 %
Platelets: 206 10*3/uL (ref 150–400)
RBC: 5.07 MIL/uL (ref 3.87–5.11)
RDW: 15.5 % (ref 11.5–15.5)
WBC: 5.5 10*3/uL (ref 4.0–10.5)
nRBC: 0 % (ref 0.0–0.2)

## 2018-09-01 LAB — COMPREHENSIVE METABOLIC PANEL
ALT: 10 U/L (ref 0–44)
AST: 19 U/L (ref 15–41)
Albumin: 3.3 g/dL — ABNORMAL LOW (ref 3.5–5.0)
Alkaline Phosphatase: 51 U/L (ref 38–126)
Anion gap: 10 (ref 5–15)
BUN: 15 mg/dL (ref 8–23)
CO2: 25 mmol/L (ref 22–32)
Calcium: 8.8 mg/dL — ABNORMAL LOW (ref 8.9–10.3)
Chloride: 100 mmol/L (ref 98–111)
Creatinine, Ser: 0.95 mg/dL (ref 0.44–1.00)
GFR calc Af Amer: >60 mL/min (ref >60)
GFR calc non Af Amer: 55 mL/min — ABNORMAL LOW (ref >60)
Glucose, Bld: 228 mg/dL — ABNORMAL HIGH (ref 70–99)
Potassium: 4.3 mmol/L (ref 3.5–5.1)
Sodium: 135 mmol/L (ref 135–145)
Total Bilirubin: 0.4 mg/dL (ref 0.3–1.2)
Total Protein: 6.8 g/dL (ref 6.5–8.1)

## 2018-09-01 LAB — ECHOCARDIOGRAM COMPLETE
Height: 60 in
Weight: 2673.74 oz

## 2018-09-01 LAB — GLUCOSE, CAPILLARY
Glucose-Capillary: 187 mg/dL — ABNORMAL HIGH (ref 70–99)
Glucose-Capillary: 162 mg/dL — ABNORMAL HIGH (ref 70–99)
Glucose-Capillary: 155 mg/dL — ABNORMAL HIGH (ref 70–99)
Glucose-Capillary: 178 mg/dL — ABNORMAL HIGH (ref 70–99)

## 2018-09-01 LAB — MAGNESIUM: Magnesium: 1.9 mg/dL (ref 1.7–2.4)

## 2018-09-01 LAB — PHOSPHORUS: Phosphorus: 3.5 mg/dL (ref 2.5–4.6)

## 2018-09-01 MED ORDER — VITAMIN B-12 1000 MCG PO TABS
1000.0000 ug | ORAL_TABLET | Freq: Every day | ORAL | Status: DC
  Administered 2018-09-01 – 2018-09-03 (×3): 1000 ug via ORAL
  Filled 2018-09-01 (×3): qty 1

## 2018-09-01 MED ORDER — SODIUM CHLORIDE 0.9 % IV SOLN
INTRAVENOUS | Status: AC
  Administered 2018-09-01: 12:00:00 via INTRAVENOUS

## 2018-09-01 NOTE — Progress Notes (Signed)
PROGRESS NOTE    Leah Levine  HFW:[id number] DOB: April 27, 1932 DOA: 08/30/2018 PCP: Dettinger, Fransisca Kaufmann, MD   Brief Narrative: The patient is an 85 year old Caucasian female with a past medical history significant for but not limited to hypertension, hyperlipidemia, diabetes mellitus type 2, CAD status post CABG, history of CVA with residual expressive aphasia, GERD, hypothyroidism, vitamin B12 deficiency and other comorbidities who presented to the ED with a chief complaint of a fever of 102 at home and generally feeling weak.  She notes that she had increased frequency of urination but denies any dysuria hematuria or flank pain.  Initially there was some concern of some shortness of breath but patient denies being short of breath presently and does have a slight dry cough.  She underwent further work-up and had a CT of her brain done which showed no acute intracranial hemorrhage and age-related atrophy and chronic microvascular ischemic changes.  CTA of the chest was done and showed no evidence of PE and there is no acute findings in the patient's thorax or account for patient's symptoms.  With 19 test was negative and admission she had a BNP of 41.  WBC on admission was 10.8 with a sodium of 134 potassium of 4.3 and a creatinine 1.25.  Urinalysis showed 21-50 WBCs and 0-5 RBCs but there is many bacteria present as well as positive nitrites and small leukocytes.  Urine culture showed >100,000 of GNR with Speciation and Sensitivities pending however we will add on blood cultures as they were not done in the ED. She was admitted for fever and generalized weakness likely secondary to suspected UTI along with slight confusion likely from UTI and mild hyponatremia.   She is improving however physical therapy and Occupational Therapy still need see the patient given her weakness.  Assessment & Plan:   Principal Problem:   Acute lower UTI Active Problems:   Type 2 diabetes mellitus with renal  manifestations (HCC)   Coronary artery disease   Hyponatremia   Acute UTI  Acute GNR UTI associated with Fever -(prior urine culture 2018, pansensitive E. Coli)  -Urinalysis showed hazy appearance, with 50 glucose, small leukocytes, positive nitrites, many bacteria, 0-5 squamous epithelial cells, 21-50 WBCs on UA -Follow Urine culture 100,000 colonies of gram-negative rods with sensitivities and speciation pending -Blood cultures ordered after patient was already placed on antibiotics showed no growth to date less than 24 hours -Continue with empiric IV ceftriaxone for now and change antibiotics based on sensitivities -WBC went from 10.8 is now 5.5 -Patient was confused and generally weak so we will obtain a PT OT evaluation.  Confusion seems to be improving  Dyspnea, unclear etiology -No longer wearing supplemental oxygen now -Check cardiac echo -We will consider PFT as outpatient -CTA Negativeand showed no evidence of PE and no acute findings of thorax account for patient's symptoms; CTA did note however a large calcified granuloma in the left upper lobe measuring 1 cm -CXR showed "Lung volumes are low. No consolidative airspace disease. No pleural effusions. No pneumothorax. Small calcified granuloma in the left mid lung, unchanged. No other suspicious pulmonary nodule or mass noted. Pulmonary vasculature and the cardiomediastinal silhouette are within normal limits. Aortic atherosclerosis. Status post median sternotomy for CABG. Cerclage wire seen projecting over the lower cervical region incidentally noted. Electronic device projecting over the cardiac ventricles, potentially an implanted pacemaker."   Uncontrolled dm2w h/o CKD stage3 -Cont Metformin 1000mg  po bid -Continue with sensitive NovoLog/scale insulin AC and at bedtime -CBG's  ranging from 162-257 -We will consult diabetes education coordinator for her uncontrolled hyperglycemia -Patient's hemoglobin A1c was  9.8  Hypothyroidism -Cont Levothyroxine 112 mcg   H/o CAD s/p CABG 2014 H/o lacunar CVA on prior MRI brain -Continue rosuvastatin 20 mg p.o. nightly -CTA of the chest showed aortic atherosclerosis in addition to left main and three-vessel coronary artery disease.  Patient is status post median sternotomy for CABG including LIMA to LAD -Aspirin 81mg  po x1 for now -Unclear if patient is taking aspirin, and will discuss with the patient if she is this AM; Not on MAR  Dehydration, Mild Renal Insufficiency, improving -Resume IVF Hydration with NS at 75 mL/hr today; GFR is improving  -BUN/Cr was 18/1.25 on Admission and is now improved to 15/0.95 -Avoid Nephrotoxic Medications, Contrast Dyes if possible, and Hypotension  -Continue monitor and trend renal function and repeat CMP in a.m.  Hyponatremia, (Pseudohyponatremia) -Patient's sodium was 134 but was likely in the setting of elevated glucose -Patient is now sodium is 135 after IV fluid hydration -Continue monitor and trend and repeat CMP in a.m.  H/o B12 deficiency per neurology outpatient notes -Started on B12 supplementation with 1000 micrograms daily  Obesity -Estimated body mass index is 32.64 kg/m as calculated from the following:   Height as of this encounter: 5' (1.524 m).   Weight as of this encounter: 75.8 kg. -Weight Loss and Dietary Counseling given   DVT prophylaxis: Enoxaparin 40 mg subcu every 24 Code Status: FULL CODE  Family Communication: No family present at bedside Disposition Plan: Pending PT/OT evaluation   Consultants:   None   Procedures:  ECHOCardiogram Ordered   Antimicrobials:  Anti-infectives (From admission, onward)   Start     Dose/Rate Route Frequency Ordered Stop   08/31/18 2200  cefTRIAXone (ROCEPHIN) 1 g in sodium chloride 0.9 % 100 mL IVPB     1 g 200 mL/hr over 30 Minutes Intravenous Every 24 hours 08/30/18 2302     08/30/18 2030  cefTRIAXone (ROCEPHIN) 1 g in sodium chloride  0.9 % 100 mL IVPB     1 g 200 mL/hr over 30 Minutes Intravenous  Once 08/30/18 2029 08/30/18 2114     Subjective: Seen and examined at bedside thinks she is doing a bit better.  States he is feeling little bit stronger.  No nausea or vomiting.  Denies chest pain, lightheadedness or dizziness.  No other concerns or complaints at this time.  Objective: Vitals:   08/31/18 1353 08/31/18 2217 09/01/18 0632 09/01/18 0804  BP: 130/71 (!) 146/67 (!) 133/98 135/84  Pulse: 66 69 (!) 58 68  Resp: 19 17 16    Temp: 97.9 F (36.6 C) 98.6 F (37 C) 97.7 F (36.5 C)   TempSrc: Oral Oral Oral   SpO2: 98% 96% 99% 100%  Weight:   75.8 kg   Height:        Intake/Output Summary (Last 24 hours) at 09/01/2018 1119 Last data filed at 09/01/2018 0900 Gross per 24 hour  Intake 1108.75 ml  Output 1400 ml  Net -291.25 ml   Filed Weights   08/30/18 1900 08/31/18 0500 09/01/18 5329  Weight: 80 kg 75.5 kg 75.8 kg   Examination: Physical Exam:  Constitutional: WN/WD obese elderly Caucasian female in NAD and appears calm and comfortable Eyes: Lids and conjunctivae normal, sclerae anicteric  ENMT: External Ears, Nose appear normal. Grossly normal hearing.  Neck: Appears normal, supple, no cervical masses, normal ROM, no appreciable thyromegaly; no JVD Respiratory: Diminished to  auscultation bilaterally, no wheezing, rales, rhonchi or crackles. Normal respiratory effort and patient is not tachypenic. No accessory muscle use.  Cardiovascular: RRR, no murmurs / rubs / gallops. S1 and S2 auscultated. No extremity edema.  Abdomen: Soft, non-tender, Distended. No masses palpated. No appreciable hepatosplenomegaly. Bowel sounds positive x4.  GU: Deferred. Musculoskeletal: No clubbing / cyanosis of digits/nails. No joint deformity upper and lower extremities.  Skin: No rashes, lesions, ulcers on a limited skin evaluation. No induration; Warm and dry.  Neurologic: CN 2-12 grossly intact with no focal deficits.  Romberg sign and cerebellar reflexes not assessed.  Psychiatric: Normal judgment and insight. Alert and awake. Pleasant mood and appropriate affect.   Data Reviewed: I have personally reviewed following labs and imaging studies  CBC: Recent Labs  Lab 08/30/18 1905 08/31/18 0823 09/01/18 0902  WBC 10.8* 6.3 5.5  NEUTROABS 8.8* 4.8 3.2  HGB 13.6 13.1 13.4  HCT 42.2 40.4 42.2  MCV 80.8 82.4 83.2  PLT 223 192 315   Basic Metabolic Panel: Recent Labs  Lab 08/30/18 1905 08/31/18 0816 08/31/18 0823 09/01/18 0902  NA 134* 137  --  135  K 4.3 4.1  --  4.3  CL 98 102  --  100  CO2 24 27  --  25  GLUCOSE 255* 208*  --  228*  BUN 18 14  --  15  CREATININE 1.25* 0.91  --  0.95  CALCIUM 8.8* 8.7*  --  8.8*  MG  --   --  2.1 1.9  PHOS  --   --  3.8 3.5   GFR: Estimated Creatinine Clearance: 40.8 mL/min (by C-G formula based on SCr of 0.95 mg/dL). Liver Function Tests: Recent Labs  Lab 08/30/18 1905 08/31/18 0816 09/01/18 0902  AST 19 14* 19  ALT 13 11 10   ALKPHOS 63 53 51  BILITOT 0.8 0.7 0.4  PROT 7.6 6.7 6.8  ALBUMIN 3.7 3.4* 3.3*   No results for input(s): LIPASE, AMYLASE in the last 168 hours. No results for input(s): AMMONIA in the last 168 hours. Coagulation Profile: No results for input(s): INR, PROTIME in the last 168 hours. Cardiac Enzymes: Recent Labs  Lab 08/30/18 1905  TROPONINI <0.03   BNP (last 3 results) No results for input(s): PROBNP in the last 8760 hours. HbA1C: Recent Labs    08/30/18 1905  HGBA1C 9.8*   CBG: Recent Labs  Lab 08/31/18 1120 08/31/18 1606 08/31/18 2219 09/01/18 0733 09/01/18 1110  GLUCAP 257* 174* 191* 187* 162*   Lipid Profile: No results for input(s): CHOL, HDL, LDLCALC, TRIG, CHOLHDL, LDLDIRECT in the last 72 hours. Thyroid Function Tests: No results for input(s): TSH, T4TOTAL, FREET4, T3FREE, THYROIDAB in the last 72 hours. Anemia Panel: No results for input(s): VITAMINB12, FOLATE, FERRITIN, TIBC, IRON,  RETICCTPCT in the last 72 hours. Sepsis Labs: No results for input(s): PROCALCITON, LATICACIDVEN in the last 168 hours.  Recent Results (from the past 240 hour(s))  Urine Culture     Status: Abnormal (Preliminary result)   Collection Time: 08/30/18  7:42 PM  Result Value Ref Range Status   Specimen Description   Final    URINE, CATHETERIZED Performed at [facility], [address] [place], [place] [address]    Special Requests   Final    NONE Performed at [facility], [address] [place],  [address]    Culture >=100,000 COLONIES/mL GRAM NEGATIVE RODS (A)  Final   Report Status PENDING  Incomplete  SARS Coronavirus 2 (CEPHEID-  Performed in [facility] hospital lab), Hosp Order     Status: None   Collection Time: 08/30/18  9:00 PM  Result Value Ref Range Status   SARS Coronavirus 2 NEGATIVE NEGATIVE Final    Comment: (NOTE) If result is NEGATIVE SARS-CoV-2 target nucleic acids are NOT DETECTED. The SARS-CoV-2 RNA is generally detectable in upper and lower  respiratory specimens during the acute phase of infection. The lowest  concentration of SARS-CoV-2 viral copies this assay can detect is 250  copies / mL. A negative result does not preclude SARS-CoV-2 infection  and should not be used as the sole basis for treatment or other  patient management decisions.  A negative result may occur with  improper specimen collection / handling, submission of specimen other  than nasopharyngeal swab, presence of viral mutation(s) within the  areas targeted by this assay, and inadequate number of viral copies  (<250 copies / mL). A negative result must be combined with clinical  observations, patient history, and epidemiological information. If result is POSITIVE SARS-CoV-2 target nucleic acids are DETECTED. The SARS-CoV-2 RNA is generally detectable in upper and lower  respiratory specimens dur ing the acute phase of infection.  Positive  results are indicative of active  infection with SARS-CoV-2.  Clinical  correlation with patient history and other diagnostic information is  necessary to determine patient infection status.  Positive results do  not rule out bacterial infection or co-infection with other viruses. If result is PRESUMPTIVE POSTIVE SARS-CoV-2 nucleic acids MAY BE PRESENT.   A presumptive positive result was obtained on the submitted specimen  and confirmed on repeat testing.  While 2019 novel coronavirus  (SARS-CoV-2) nucleic acids may be present in the submitted sample  additional confirmatory testing may be necessary for epidemiological  and / or clinical management purposes  to differentiate between  SARS-CoV-2 and other Sarbecovirus currently known to infect humans.  If clinically indicated additional testing with an alternate test  methodology [phone]) is advised. The SARS-CoV-2 RNA is generally  detectable in upper and lower respiratory sp ecimens during the acute  phase of infection. The expected result is Negative. Fact Sheet for Patients:  StrictlyIdeas.no Fact Sheet for Healthcare Providers: BankingDealers.co.za This test is not yet approved or cleared by the [place] FDA and has been authorized for detection and/or diagnosis of SARS-CoV-2 by FDA under an Emergency Use Authorization (EUA).  This EUA will remain in effect (meaning this test can be used) for the duration of the COVID-19 declaration under Section 564(b)(1) of the Act, 21 U.S.C. section 360bbb-3(b)(1), unless the authorization is terminated or revoked sooner. Performed at [facility], [address], [place], [place] [address]   Culture, blood (routine x 2)     Status: None (Preliminary result)   Collection Time: 08/31/18  9:51 AM  Result Value Ref Range Status   Specimen Description BLOOD RIGHT ARM BOTTLES DRAWN AEROBIC ONLY  Final   Special Requests Blood Culture adequate volume  Final   Culture   Final     NO GROWTH < 24 HOURS Performed at [facility], [address] [place], [place] [address]    Report Status PENDING  Incomplete  Culture, blood (routine x 2)     Status: None (Preliminary result)   Collection Time: 08/31/18 10:00 AM  Result Value Ref Range Status   Specimen Description   Final    BLOOD RIGHT ARM BOTTLES DRAWN AEROBIC AND ANAEROBIC   Special Requests   Final    Blood  Culture results may not be optimal due to an inadequate volume of blood received in culture bottles   Culture   Final    NO GROWTH < 24 HOURS Performed at [facility], [address] [place], [place] [address]    Report Status PENDING  Incomplete    Radiology Studies: Ct Head Wo Contrast  Result Date: 08/30/2018 CLINICAL DATA:  85 year old female with generalized weakness and altered mental status. EXAM: CT HEAD WITHOUT CONTRAST TECHNIQUE: Contiguous axial images were obtained from the base of the skull through the vertex without intravenous contrast. COMPARISON:  Head CT dated 06/20/2016 and MRI dated 06/04/2018 FINDINGS: Brain: There is moderate age-related atrophy and chronic microvascular ischemic changes. There is no acute intracranial hemorrhage. No mass effect or midline shift. No extra-axial fluid collection. Vascular: No hyperdense vessel or unexpected calcification. Skull: Normal. Negative for fracture or focal lesion. Sinuses/Orbits: No acute finding. Other: None IMPRESSION: 1. No acute intracranial hemorrhage. 2. Age-related atrophy and chronic microvascular ischemic changes. Electronically Signed   By: Anner Crete M.D.   On: 08/30/2018 23:48   Ct Angio Chest Pe W And/or Wo Contrast  Result Date: 08/30/2018 CLINICAL DATA:  85 year old female with history of shortness of breath. EXAM: CT ANGIOGRAPHY CHEST WITH CONTRAST TECHNIQUE: Multidetector CT imaging of the chest was performed using the standard protocol during bolus administration of intravenous contrast. Multiplanar CT image reconstructions and  MIPs were obtained to evaluate the vascular anatomy. CONTRAST:  75 mL of Omnipaque 350. COMPARISON:  Chest CT 02/21/2005. FINDINGS: Cardiovascular: No filling defects within the pulmonary arterial tree to suggest underlying pulmonary embolism. Heart size is normal. There is no significant pericardial fluid, thickening or pericardial calcification. There is aortic atherosclerosis, as well as atherosclerosis of the great vessels of the mediastinum and the coronary arteries, including calcified atherosclerotic plaque in the left main, left anterior descending, left circumflex and right coronary arteries. Status post median sternotomy for CABG including LIMA to the LAD. Mediastinum/Nodes: No pathologically enlarged mediastinal or hilar lymph nodes. Esophagus is unremarkable in appearance. No axillary lymphadenopathy. Lungs/Pleura: Large calcified granuloma in the left upper lobe measuring 1 cm. No other suspicious appearing pulmonary nodules or masses are noted. No acute consolidative airspace disease. No pleural effusions. Upper Abdomen: Aortic atherosclerosis. Several calcified granulomas throughout the spleen. Musculoskeletal: Median sternotomy wires. There are no aggressive appearing lytic or blastic lesions noted in the visualized portions of the skeleton. Review of the MIP images confirms the above findings. IMPRESSION: 1. No evidence of pulmonary embolism. 2. No acute findings in the thorax to account for the patient's symptoms. 3. Aortic atherosclerosis, in addition to left main and 3 vessel coronary artery disease. Status post median sternotomy for CABG including LIMA to the LAD. 4. Old granulomatous disease, as above. Aortic Atherosclerosis (ICD10-I70.0). Electronically Signed   By: Vinnie Langton M.D.   On: 08/30/2018 21:49   Dg Chest Port 1 View  Result Date: 08/30/2018 CLINICAL DATA:  85 year old female with history of shortness of breath. EXAM: PORTABLE CHEST 1 VIEW COMPARISON:  Chest x-ray  06/04/2018. FINDINGS: Lung volumes are low. No consolidative airspace disease. No pleural effusions. No pneumothorax. Small calcified granuloma in the left mid lung, unchanged. No other suspicious pulmonary nodule or mass noted. Pulmonary vasculature and the cardiomediastinal silhouette are within normal limits. Aortic atherosclerosis. Status post median sternotomy for CABG. Cerclage wire seen projecting over the lower cervical region incidentally noted. Electronic device projecting over the cardiac ventricles, potentially an implanted pacemaker. IMPRESSION: 1. Low lung  volumes without radiographic evidence of acute cardiopulmonary disease. 2. Aortic atherosclerosis. 3. Postoperative changes, as above. Electronically Signed   By: Vinnie Langton M.D.   On: 08/30/2018 20:17   Scheduled Meds: . enoxaparin (LOVENOX) injection  40 mg Subcutaneous Q24H  . insulin aspart  0-5 Units Subcutaneous QHS  . insulin aspart  0-9 Units Subcutaneous TID WC  . levothyroxine  112 mcg Oral QAC breakfast  . metFORMIN  1,000 mg Oral BID WC  . rosuvastatin  20 mg Oral Daily   Continuous Infusions: . cefTRIAXone (ROCEPHIN)  IV 1 g (08/31/18 2258)    LOS: 0 days   Kerney Elbe, DO [facility] PAGER is on [place]  If 7PM-7AM, please contact night-coverage [email or web address] Password [email or web address] 09/01/2018, 11:19 AM

## 2018-09-01 NOTE — Progress Notes (Signed)
Initial Nutrition Assessment  DOCUMENTATION CODES:   Obesity unspecified  INTERVENTION:  Provided CHO modified diet handout    NUTRITION DIAGNOSIS:   Impaired nutrient utilization related to chronic illness(poorly controlled diabetes) as evidenced by (Hemoglobin A1C-9.8%).   GOAL:   (Patient will follow CHO modified Heart Healthy diet)   MONITOR:   Labs, Weight trends, PO intake  REASON FOR ASSESSMENT:   Malnutrition Screening Tool    ASSESSMENT:  Patient is an obese 85 yo female with poorly controlled diabetes type 2. History of GERD, Stroke,anemia, B-12 deificency and CAD. Patient home diet is regular. Admitted initially for UTI. Her current meal intake 50%.    Lab Results  Component Value Date   HGBA1C 9.8 (H) 08/30/2018   CBG (last 3)  Recent Labs    08/31/18 2219 09/01/18 0733 09/01/18 1110  GLUCAP 191* 187* 162*     RD provided "Carbohydrate Counting for People with Diabetes" handout from the Academy of Nutrition and Dietetics. RD contact number provided. Diabetes coordinator following.   RD was drawn to patient due to malnutrition screen. However weight appears stable between 74-79 kg the past 2+ years.  Medications reviewed and include: B-12, Insulin, Crestor, Rocephin   Diet Order:   Diet Order            Diet heart healthy/carb modified Room service appropriate? Yes; Fluid consistency: Thin  Diet effective now              EDUCATION NEEDS: addressed   Skin:  Skin Assessment: Reviewed RN Assessment(MASD (abdomen and perineum))  Last BM:  6/7  Height:   Ht Readings from Last 1 Encounters:  08/30/18 5' (1.524 m)    Weight:   Wt Readings from Last 1 Encounters:  09/01/18 75.8 kg    Ideal Body Weight:   45 kg  BMI:  Body mass index is 32.64 kg/m.  Estimated Nutritional Needs:   Kcal:  1368- 1596 (MSJ x AF 1.2-1.4)  Protein:  68-81 (1.5-1.8 gr/kg/ibw)  Fluid:  >1400 ml daily    Colman Cater MS,RD,CSG,LDN Office:  [phone] Pager: [phone]

## 2018-09-01 NOTE — Care Management Obs Status (Signed)
[place] NOTIFICATION   Patient Details  Name: Leah Levine MRN: [id number] Date of Birth: December 21, 1932   Medicare Observation Status Notification Given:  Yes(Spoke with patient's daughter and copy was given to the patient)    Tommy Medal 09/01/2018, 1:40 PM

## 2018-09-01 NOTE — Progress Notes (Signed)
  Echocardiogram 2D Echocardiogram has been performed.  Leah Levine 09/01/2018, 3:32 PM

## 2018-09-01 NOTE — Progress Notes (Addendum)
Inpatient Diabetes Program Recommendations  AACE/ADA: New Consensus Statement on Inpatient Glycemic Control (2015)  Target Ranges:  Prepandial:   less than 140 mg/dL      Peak postprandial:   less than 180 mg/dL (1-2 hours)      Critically ill patients:  140 - 180 mg/dL   Results for Leah Levine, Leah Levine (MRN [id number]) as of 09/01/2018 11:46  Ref. Range 08/31/2018 07:28 08/31/2018 11:20 08/31/2018 16:06 08/31/2018 22:19  Glucose-Capillary Latest Ref Range: 70 - 99 mg/dL 207 (H) 257 (H) 174 (H) 191 (H)   Results for Leah Levine, Leah Levine (MRN [id number]) as of 09/01/2018 11:46  Ref. Range 09/01/2018 07:33 09/01/2018 11:10  Glucose-Capillary Latest Ref Range: 70 - 99 mg/dL 187 (H) 162 (H)   Results for Leah Levine, Leah Levine (MRN [id number]) as of 09/01/2018 11:46  Ref. Range 08/30/2018 19:05  Hemoglobin A1C Latest Ref Range: 4.8 - 5.6 % 9.8 (H)  (234 mg/dl)    Admit with: Fever/ Acute UTI  History: DM, CABG, CVA  Home DM Meds: Metformin 1000 mg BID  Current Orders: Novolog Sensitive Correction Scale/ SSI (0-9 units) TID AC + HS      Metformin 1000 mg BID    MD- Given current A1c of 9.8%, pt likely needs escalated Diabetes therapy at home.  Only taking Metformin BID.  Patient may benefit from the addition of another oral DM medication at home.  Recommend adding DPP-4 inhibitor medication like Tradjenta 5 mg Daily.  Lady Gary has low risk of Hypoglycemia given it's glucose dependent nature.      --Will follow patient during hospitalization--  Wyn Quaker RN, MSN, CDE Diabetes Coordinator Inpatient Glycemic Control Team Team Pager: [phone] (8a-5p)

## 2018-09-01 NOTE — Care Management Obs Status (Deleted)
[place] NOTIFICATION   Patient Details  Name: Leah Levine MRN: [id number] Date of Birth: 12/24/1932   Medicare Observation Status Notification Given:  Yes    Tommy Medal 09/01/2018, 9:46 AM

## 2018-09-01 NOTE — TOC Initial Note (Signed)
Transition of Care [facility]) - Initial/Assessment Note    Patient Details  Name: Leah Levine MRN: [id number] Date of Birth: 1932-11-25  Transition [facility] Care [facility] CM/SW Contact:    Boneta Lucks, RN Phone Number: 09/01/2018, 2:36 PM  Clinical Narrative:     Patient admitted for observation for UTI, changed to Inpatient today, due to weakness, Patient need PT and OT consult to evaluated, TOC to follow.                 Barriers to Discharge: Continued Medical Work up   Patient Goals and CMS Choice        Expected Discharge Plan and Services     Discharge Planning Services: CM Consult                                          Prior Living Arrangements/Services   Lives with:: Self          Need for Family Participation in Patient Care: Yes (Comment)        Activities of Daily Living Home Assistive Devices/Equipment: Environmental consultant (specify type) ADL Screening (condition at time of admission) Patient's cognitive ability adequate to safely complete daily activities?: Yes Is the patient deaf or have difficulty hearing?: No Does the patient have difficulty seeing, even when wearing glasses/contacts?: No Does the patient have difficulty concentrating, remembering, or making decisions?: No Patient able to express need for assistance with ADLs?: Yes Does the patient have difficulty dressing or bathing?: Yes Independently performs ADLs?: No Communication: Independent Dressing (OT): Needs assistance Is this a change from baseline?: Change from baseline, expected to last <3days Grooming: Needs assistance Is this a change from baseline?: Change from baseline, expected to last <3 days Feeding: Independent Bathing: Needs assistance Is this a change from baseline?: Change from baseline, expected to last <3 days Toileting: Needs assistance Is this a change from baseline?: Change from baseline, expected to last <3 days In/Out Bed: Needs assistance Is this a change  from baseline?: Change from baseline, expected to last <3 days Walks in Home: Needs assistance Is this a change from baseline?: Change from baseline, expected to last <3 days Does the patient have difficulty walking or climbing stairs?: No Weakness of Legs: Both Weakness of Arms/Hands: None  Permission Sought/Granted                  Emotional Assessment              Admission diagnosis:  SOB (shortness of breath) [R06.02] Patient Active Problem List   Diagnosis Date Noted  . Acute lower UTI 08/30/2018  . Hyponatremia 08/30/2018  . Acute UTI 08/30/2018  . Dizziness 11/08/2016  . Gait abnormality 11/08/2016  . Aphasia   . TIA (transient ischemic attack) 06/20/2016  . Chest pain 03/06/2016  . Arm pain, anterior, left 03/06/2016  . Orthostatic hypotension 02/21/2016  . Late effect of cerebrovascular accident 11/10/2015  . Foraminal stenosis of cervical region 11/10/2015  . Frequent falls 11/10/2015  . Hypertriglyceridemia 09/05/2015  . Neck muscle spasm 08/26/2015  . CKD stage 3 due to type 2 diabetes mellitus ([place]) 08/26/2015  . Intertrigo 03/14/2015  . Stroke (cerebrum) ([place]) 03/05/2015  . Expressive aphasia 03/05/2015  . Syncope 12/09/2013  . Coronary artery disease 09/18/2013  . Hypothyroidism 06/18/2013  . S/P CABG x 4 12/01/2012  . Vitamin B12 deficiency 12/05/2010  . Personal history  of colonic polyps 12/05/2010  . Esophageal dysphagia 11/20/2010  . Benign neoplasm of colon 11/20/2010  . Gastritis, chronic 11/20/2010  . Allergic rhinitis 07/03/2010  . Tremor   . GERD (gastroesophageal reflux disease)   . Hyperlipidemia   . Hypertension   . Urinary incontinence   . Type 2 diabetes mellitus with renal manifestations ([place])    PCP:  Dettinger, Fransisca Kaufmann, MD Pharmacy:   [place], [place] [address] Phone: [phone] Fax: [phone]  [facility],  [address] [place] [address] Phone: [phone] Fax: [phone]  [id number] [place], [address] [place] [address] Phone: [phone] Fax: [phone]     Social Determinants of Health (SDOH) Interventions    Readmission Risk Interventions No flowsheet data found.

## 2018-09-02 DIAGNOSIS — N390 Urinary tract infection, site not specified: Secondary | ICD-10-CM | POA: Diagnosis not present

## 2018-09-02 DIAGNOSIS — I2510 Atherosclerotic heart disease of native coronary artery without angina pectoris: Secondary | ICD-10-CM | POA: Diagnosis not present

## 2018-09-02 DIAGNOSIS — A498 Other bacterial infections of unspecified site: Secondary | ICD-10-CM

## 2018-09-02 DIAGNOSIS — E1122 Type 2 diabetes mellitus with diabetic chronic kidney disease: Secondary | ICD-10-CM | POA: Diagnosis not present

## 2018-09-02 DIAGNOSIS — E871 Hypo-osmolality and hyponatremia: Secondary | ICD-10-CM | POA: Diagnosis not present

## 2018-09-02 LAB — COMPREHENSIVE METABOLIC PANEL
ALT: 11 U/L (ref 0–44)
AST: 15 U/L (ref 15–41)
Albumin: 3.2 g/dL — ABNORMAL LOW (ref 3.5–5.0)
Alkaline Phosphatase: 44 U/L (ref 38–126)
Anion gap: 8 (ref 5–15)
BUN: 17 mg/dL (ref 8–23)
CO2: 24 mmol/L (ref 22–32)
Calcium: 8.6 mg/dL — ABNORMAL LOW (ref 8.9–10.3)
Chloride: 106 mmol/L (ref 98–111)
Creatinine, Ser: 0.87 mg/dL (ref 0.44–1.00)
GFR calc Af Amer: >60 mL/min (ref >60)
GFR calc non Af Amer: >60 mL/min (ref >60)
Glucose, Bld: 173 mg/dL — ABNORMAL HIGH (ref 70–99)
Potassium: 4 mmol/L (ref 3.5–5.1)
Sodium: 138 mmol/L (ref 135–145)
Total Bilirubin: 0.4 mg/dL (ref 0.3–1.2)
Total Protein: 6.5 g/dL (ref 6.5–8.1)

## 2018-09-02 LAB — CBC WITH DIFFERENTIAL/PLATELET
Abs Immature Granulocytes: 0.02 10*3/uL (ref 0.00–0.07)
Basophils Absolute: 0 10*3/uL (ref 0.0–0.1)
Basophils Relative: 0 %
Eosinophils Absolute: 0.2 10*3/uL (ref 0.0–0.5)
Eosinophils Relative: 2 %
HCT: 40.8 % (ref 36.0–46.0)
Hemoglobin: 13 g/dL (ref 12.0–15.0)
Immature Granulocytes: 0 %
Lymphocytes Relative: 29 %
Lymphs Abs: 1.8 10*3/uL (ref 0.7–4.0)
MCH: 26 pg (ref 26.0–34.0)
MCHC: 31.9 g/dL (ref 30.0–36.0)
MCV: 81.6 fL (ref 80.0–100.0)
Monocytes Absolute: 0.5 10*3/uL (ref 0.1–1.0)
Monocytes Relative: 7 %
Neutro Abs: 3.9 10*3/uL (ref 1.7–7.7)
Neutrophils Relative %: 62 %
Platelets: 209 10*3/uL (ref 150–400)
RBC: 5 MIL/uL (ref 3.87–5.11)
RDW: 15.2 % (ref 11.5–15.5)
WBC: 6.3 10*3/uL (ref 4.0–10.5)
nRBC: 0 % (ref 0.0–0.2)

## 2018-09-02 LAB — MAGNESIUM: Magnesium: 2 mg/dL (ref 1.7–2.4)

## 2018-09-02 LAB — GLUCOSE, CAPILLARY
Glucose-Capillary: 167 mg/dL — ABNORMAL HIGH (ref 70–99)
Glucose-Capillary: 186 mg/dL — ABNORMAL HIGH (ref 70–99)
Glucose-Capillary: 99 mg/dL (ref 70–99)
Glucose-Capillary: 151 mg/dL — ABNORMAL HIGH (ref 70–99)

## 2018-09-02 LAB — PHOSPHORUS: Phosphorus: 3.8 mg/dL (ref 2.5–4.6)

## 2018-09-02 NOTE — Evaluation (Signed)
Physical Therapy Evaluation Patient Details Name: Leah Levine MRN: [id number] DOB: 1933/02/16 Today's Date: 09/02/2018   History of Present Illness  Leah Levine  is a 85 y.o. female, w hypertension, hyperlipidemia, Dm2, CAD s/p CABG, h/o CVA with residual expressive aphasia, Jerrye Bushy, Hypothyroidism, B 12 deficiency apparently had fever 102 at home, and felt generally weak.  Pt notes that she has had increase in frequency of urination but denies dysuria/ hematuria, or flank pain.  There was some question of sob, but pt denies being sob presently.   Pt notes slight dry cough. Pt denies cp, palp, n/v, abd pain, diarrhea, brbpr, black stool.  Pt presented for evaluation of fever/ weakness (generalized)    Clinical Impression  Physical therapy evaluation completed, patient is at baseline and no further PT services recommended at this time. Pt able to ambulate throughout hospital room and hallways completing turns and navigating past obstacles without unsteadiness demonstrating decreased cadence, but reports "this is my normal speed". Patient discharged to care of nursing for ambulation daily as tolerated for length of stay.     Follow Up Recommendations No PT follow up    Equipment Recommendations  None recommended by PT    Recommendations for Other Services       Precautions / Restrictions Precautions Precautions: None Restrictions Weight Bearing Restrictions: No      Mobility  Bed Mobility Overal bed mobility: Modified Independent             General bed mobility comments: Pt able to come to sitting on EOB, remove covers without difficulty or increased time  Transfers Overall transfer level: Modified independent Equipment used: Rolling walker (2 wheeled)             General transfer comment: Pt performs sit to stand and stand pivot transfers using RW without assistance, only requiring initial verbal cues for hand placement  Ambulation/Gait Ambulation/Gait  assistance: Modified independent (Device/Increase time) Gait Distance (Feet): 150 Feet Assistive device: Rolling walker (2 wheeled) Gait Pattern/deviations: Step-through pattern;Decreased stride length Gait velocity: decreased   General Gait Details: Pt with step through gait pattern, decreased heel-toe pattern, without unsteadiness, and able to converse while ambulating without difficulty  Stairs            Wheelchair Mobility    Modified Rankin (Stroke Patients Only)       Balance Overall balance assessment: No apparent balance deficits (not formally assessed)                                           Pertinent Vitals/Pain Pain Assessment: No/denies pain    Home Living Family/patient expects to be discharged to:: Private residence Living Arrangements: Children(Daughter) Available Help at Discharge: Family;Available 24 hours/day Type of Home: House Home Access: Stairs to enter Entrance Stairs-Rails: None Entrance Stairs-Number of Steps: 2 Home Layout: Multi-level;Able to live on main level with bedroom/bathroom Home Equipment: Walker - 4 wheels;Cane - single point;Bedside commode;Wheelchair - manual      Prior Function Level of Independence: Independent with assistive device(s);Needs assistance   Gait / Transfers Assistance Needed: Pt reports using SPC or RW in house, but prefers the RW. Pt reports her daughter pushes her in [facility] in the community.  ADL's / Homemaking Assistance Needed: Pt reports her daughter occasionally assists her with bathing and dressing, and daughter provides cooking, cleaning, and transportation  Comments: Pt reports she can  cook, clean, and ambulate in community, but pt's family prefers to assist pt     Hand Dominance        Extremity/Trunk Assessment   Upper Extremity Assessment Upper Extremity Assessment: Defer to OT evaluation    Lower Extremity Assessment Lower Extremity Assessment: Overall WFL for tasks  assessed    Cervical / Trunk Assessment Cervical / Trunk Assessment: Normal  Communication   Communication: No difficulties  Cognition Arousal/Alertness: Awake/alert Behavior During Therapy: WFL for tasks assessed/performed Overall Cognitive Status: Within Functional Limits for tasks assessed                                 General Comments: Pt able to follow all commands appropriately, oriented to location and self, and answers all questions appropriately      General Comments      Exercises     Assessment/Plan    PT Assessment Patent does not need any further PT services  PT Problem List         PT Treatment Interventions      PT Goals (Current goals can be found in the Care Plan section)  Acute Rehab PT Goals Patient Stated Goal: return home PT Goal Formulation: With patient Time For Goal Achievement: 09/02/18 Potential to Achieve Goals: Good    Frequency     Barriers to discharge        Co-evaluation               AM-PAC PT "6 Clicks" Mobility  Outcome Measure Help needed turning from your back to your side while in a flat bed without using bedrails?: None Help needed moving from lying on your back to sitting on the side of a flat bed without using bedrails?: None Help needed moving to and from a bed to a chair (including a wheelchair)?: None Help needed standing up from a chair using your arms (e.g., wheelchair or bedside chair)?: None Help needed to walk in hospital room?: None Help needed climbing 3-5 steps with a railing? : None 6 Click Score: 24    End of Session Equipment Utilized During Treatment: Gait belt Activity Tolerance: Patient tolerated treatment well Patient left: in chair;with call bell/phone within reach;with chair alarm set Nurse Communication: Mobility status PT Visit Diagnosis: Other abnormalities of gait and mobility (R26.89)    Time: 1105-1130 PT Time Calculation (min) (ACUTE ONLY): 25 min   Charges:    PT Evaluation $PT Eval Moderate Complexity: 1 Mod PT Treatments $Gait Training: 8-22 mins        11:55 AM, 09/02/18 Talbot Grumbling, DPT Physical Therapist with [facility] [phone] office

## 2018-09-02 NOTE — Progress Notes (Signed)
PROGRESS NOTE    Leah Levine  WUX:[id number] DOB: 01-24-1933 DOA: 08/30/2018 PCP: Dettinger, Fransisca Kaufmann, MD   Brief Narrative: The patient is an 85 year old Caucasian female with a past medical history significant for but not limited to hypertension, hyperlipidemia, diabetes mellitus type 2, CAD status post CABG, history of CVA with residual expressive aphasia, GERD, hypothyroidism, vitamin B12 deficiency and other comorbidities who presented to the ED with a chief complaint of a fever of 102 at home and generally feeling weak.  She notes that she had increased frequency of urination but denies any dysuria hematuria or flank pain.  Initially there was some concern of some shortness of breath but patient denies being short of breath presently and does have a slight dry cough.  She underwent further work-up and had a CT of her brain done which showed no acute intracranial hemorrhage and age-related atrophy and chronic microvascular ischemic changes.  CTA of the chest was done and showed no evidence of PE and there is no acute findings in the patient's thorax or account for patient's symptoms.  With 19 test was negative and admission she had a BNP of 41.  WBC on admission was 10.8 with a sodium of 134 potassium of 4.3 and a creatinine 1.25.  Urinalysis showed 21-50 WBCs and 0-5 RBCs but there is many bacteria present as well as positive nitrites and small leukocytes.  Urine culture showed >100,000 of GNR with Speciation and Sensitivities pending however we will add on blood cultures as they were not done in the ED. urine culture shows greater than 100,000 colonies of Klebsiella pneumoniae with susceptibilities still pending currently so we will continue IV ceftriaxone for now She was admitted for fever and generalized weakness likely secondary to suspected UTI along with slight confusion likely from UTI and mild hyponatremia.   She is improving and physical therapy evaluated today and recommended no  follow-up.  Anticipate discharge home in a.m. after susceptibilities have resulted and antibiotics are narrowed for targeted therapy.  Of note hospitalization has been complicated by uncontrolled hyperglycemia and patient's hemoglobin A1c was 9.8.  Diabetes education coordinator has been consulted and will need further diabetic teaching.  Likely will add another medication at discharge such as Fowler.  Assessment & Plan:   Principal Problem:   Acute lower UTI Active Problems:   Type 2 diabetes mellitus with renal manifestations (HCC)   Coronary artery disease   Hyponatremia   Acute UTI  Acute Klebsiella pneumonia UTI associated with Fever -(prior urine culture 2018, pansensitive E. Coli)  -Urinalysis showed hazy appearance, with 50 glucose, small leukocytes, positive nitrites, many bacteria, 0-5 squamous epithelial cells, 21-50 WBCs on UA -Follow Urine culture 100,000 colonies of gram-negative rods with sensitivities and speciation pending; today it shows 100,000 colonies of Klebsiella pneumoniae with susceptibilities still pending -Blood cultures ordered after patient was already placed on antibiotics showed no growth to date at 2 Days  -Continue with empiric IV ceftriaxone for now and change antibiotics based on sensitivities -WBC went from 10.8 is now 6.3 -Patient was confused and generally weak so we will obtain a PT OT evaluation.  Confusion seems to be improving and PT evaluated and recommended no Follow up   Dyspnea, unclear etiology -No longer wearing supplemental oxygen now -Checked Transthoracic ECHO and showed the left ventricle has a normal systolic function with an EF of 60 to 65% but there is also left ventricular diastolic Doppler parameters are consistent with impaired relaxation and no evidence of any  left ventricular regional wall motion abnormalities.  The right ventricle has a normal systolic function as well -We will consider PFTs as outpatient -CTA Negativeand  showed no evidence of PE and no acute findings of thorax account for patient's symptoms; CTA did note however a large calcified granuloma in the left upper lobe measuring 1 cm -CXR showed "Lung volumes are low. No consolidative airspace disease. No pleural effusions. No pneumothorax. Small calcified granuloma in the left mid lung, unchanged. No other suspicious pulmonary nodule or mass noted. Pulmonary vasculature and the cardiomediastinal silhouette are within normal limits. Aortic atherosclerosis. Status post median sternotomy for CABG. Cerclage wire seen projecting over the lower cervical region incidentally noted. Electronic device projecting over the cardiac ventricles, potentially an implanted pacemaker."  -Check ambulatory home O2 screen prior to discharge  Uncontrolled DM2w h/o CKD stage3 -Cont Metformin 1000mg  po bid (Home Medication); Will likely need another Agent at D/C such as Tradjenta recommended by Diabetes Education Coordinator   -Continue with sensitive NovoLog/scale insulin AC and at bedtime -CBG's ranging from 155-187 -We will consult diabetes education coordinator for her uncontrolled hyperglycemia to provide further Diabetic Teaching  -Patient's hemoglobin A1c was 9.8 -We will need closer PCP follow-up  Hypothyroidism -Cont Levothyroxine 112 mcg   H/o CAD s/p CABG 2014 H/o lacunar CVA on prior MRI brain -Continue Rosuvastatin 20 mg p.o. nightly -CTA of the chest showed aortic atherosclerosis in addition to left main and three-vessel coronary artery disease.  Patient is status post median sternotomy for CABG including LIMA to LAD -Aspirin 81mg  po x1 for now -Unclear if patient is taking aspirin, and will discuss with the patient if she is; Not on MAR  Dehydration, Mild Renal Insufficiency, improving -Resumed IVF Hydration with NS at 75 mL/hr yesterday and now stopped; GFR is improving and is now >60 -BUN/Cr was 18/1.25 on Admission and is now improved to  17/0.87 -Avoid Nephrotoxic Medications, Contrast Dyes if possible, and Hypotension  -Continue monitor and trend renal function and repeat CMP in a.m.  Hyponatremia, (Pseudohyponatremia) -Patient's sodium was 134 but was likely in the setting of elevated glucose; Now improved and Na+ is 138 -IVF Hydration has now stopped  -Continue monitor and trend and repeat CMP in a.m.  H/o B12 deficiency per neurology outpatient notes -Started on B12 supplementation with 1000 micrograms of B12 po daily  Obesity -Estimated body mass index is 32.38 kg/m as calculated from the following:   Height as of this encounter: 5' (1.524 m).   Weight as of this encounter: 75.2 kg. -Weight Loss and Dietary Counseling given   DVT prophylaxis: Enoxaparin 40 mg subcu every 24 Code Status: FULL CODE  Family Communication: No family present at bedside Disposition Plan: Anticipate D/C Home in AM when Susceptibilities of UTI result; PT recommending no Follow up   Consultants:   None   Procedures:  ECHOCardiogram IMPRESSIONS    1. The left ventricle has normal systolic function with an ejection fraction of 60-65%. The cavity size was normal. There is mildly increased left ventricular wall thickness. Left ventricular diastolic Doppler parameters are consistent with impaired  relaxation. No evidence of left ventricular regional wall motion abnormalities.  2. The right ventricle has normal systolic function. The cavity was normal. There is no increase in right ventricular wall thickness.  3. The aortic valve is tricuspid. Mild calcification of the aortic valve. Mild aortic annular calcification noted.  4. The mitral valve is grossly normal.  5. The tricuspid valve is grossly normal.  FINDINGS  Left Ventricle: The left ventricle has normal systolic function, with an ejection fraction of 60-65%. The cavity size was normal. There is mildly increased left ventricular wall thickness. Left ventricular diastolic  Doppler parameters are consistent  with impaired relaxation. No evidence of left ventricular regional wall motion abnormalities..  Right Ventricle: The right ventricle has normal systolic function. The cavity was normal. There is no increase in right ventricular wall thickness.  Left Atrium: Left atrial size was normal in size.  Right Atrium: Right atrial size was normal in size. Right atrial pressure is estimated at 3 mmHg.  Interatrial Septum: No atrial level shunt detected by color flow Doppler.  Pericardium: There is no evidence of pericardial effusion.  Mitral Valve: The mitral valve is grossly normal. Mitral valve regurgitation is trivial by color flow Doppler.  Tricuspid Valve: The tricuspid valve is grossly normal. Tricuspid valve regurgitation is trivial by color flow Doppler.  Aortic Valve: The aortic valve is tricuspid Mild calcification of the aortic valve. Aortic valve regurgitation was not visualized by color flow Doppler. Mild aortic annular calcification noted.  Pulmonic Valve: The pulmonic valve was grossly normal. Pulmonic valve regurgitation is trivial by color flow Doppler.    +--------------+--------++ LEFT VENTRICLE         +----------------+---------++ +--------------+--------++ Diastology                PLAX 2D                +----------------+---------++ +--------------+--------++ LV e' lateral:  6.85 cm/s LVIDd:        4.22 cm  +----------------+---------++ +--------------+--------++ LV E/e' lateral:10.3      LVIDs:        3.06 cm  +----------------+---------++ +--------------+--------++ LV e' medial:   5.87 cm/s LV PW:        1.33 cm  +----------------+---------++ +--------------+--------++ LV E/e' medial: 12.0      LV IVS:       0.88 cm  +----------------+---------++ +--------------+--------++ LVOT diam:    1.90 cm  +--------------+--------++ LV SV:        43 ml     +--------------+--------++ LV SV Index:  23.42    +--------------+--------++ LVOT Area:    2.84 cm +--------------+--------++                        +--------------+--------++  +---------------+---------++ RIGHT VENTRICLE          +---------------+---------++ RV S prime:    8.70 cm/s +---------------+---------++ TAPSE (M-mode):2.1 cm    +---------------+---------++  +---------------+-------++-----------++ LEFT ATRIUM           Index       +---------------+-------++-----------++ LA diam:       4.10 cm2.37 cm/m  +---------------+-------++-----------++ LA Vol (A2C):  58.6 ml33.89 ml/m +---------------+-------++-----------++ LA Vol (A4C):  49.6 ml28.68 ml/m +---------------+-------++-----------++ LA Biplane Vol:58.7 ml33.94 ml/m +---------------+-------++-----------++ +------------+---------++-----------++ RIGHT ATRIUM         Index       +------------+---------++-----------++ RA Area:    10.20 cm            +------------+---------++-----------++ RA Volume:  20.20 ml 11.68 ml/m +------------+---------++-----------++  +------------+-----------++ AORTIC VALVE            +------------+-----------++ LVOT Vmax:  95.30 cm/s  +------------+-----------++ LVOT Vmean: 61.200 cm/s +------------+-----------++ LVOT VTI:   0.215 m     +------------+-----------++   +-------------+-------++ AORTA                +-------------+-------++ Ao Root diam:3.10 cm +-------------+-------++  Ao Asc diam: 2.90 cm +-------------+-------++  +--------------+----------++ MITRAL VALVE              +--------------+-------+ +--------------+----------++  SHUNTS                MV Area (PHT):2.80 cm    +--------------+-------+ +--------------+----------++  Systemic VTI: 0.22 m  MV PHT:       78.59 msec  +--------------+-------+ +--------------+----------++  Systemic  Diam:1.90 cm MV Decel Time:271 msec    +--------------+-------+ +--------------+----------++ +--------------+-----------++ MV E velocity:70.30 cm/s  +--------------+-----------++ MV A velocity:111.00 cm/s +--------------+-----------++ MV E/A ratio: 0.63        +--------------+-----------++   Antimicrobials:  Anti-infectives (From admission, onward)   Start     Dose/Rate Route Frequency Ordered Stop   08/31/18 2200  cefTRIAXone (ROCEPHIN) 1 g in sodium chloride 0.9 % 100 mL IVPB     1 g 200 mL/hr over 30 Minutes Intravenous Every 24 hours 08/30/18 2302     08/30/18 2030  cefTRIAXone (ROCEPHIN) 1 g in sodium chloride 0.9 % 100 mL IVPB     1 g 200 mL/hr over 30 Minutes Intravenous  Once 08/30/18 2029 08/30/18 2114     Subjective: Seen and examined at bedside thinks she is getting better but was still complaining some burning with her urination.  No nausea or vomiting.  Feels weak still but able to work with physical therapy and recommended no follow-up.  Urine cultures growing out Klebsiella however susceptibilities are still pending and will observe the patient overnight again and continue IV ceftriaxone and anticipate discharge in the morning.  She will need a home O2 screen prior to discharge  Objective: Vitals:   09/01/18 0804 09/01/18 1348 09/01/18 2142 09/02/18 0513  BP: 135/84 135/60 (!) 157/90 (!) 157/74  Pulse: 68 66 75 79  Resp:  16 20 (!) 24  Temp:  98.7 F (37.1 C) 98.3 F (36.8 C) 98.6 F (37 C)  TempSrc:  Oral Oral Oral  SpO2: 100% 97% 96% 97%  Weight:    75.2 kg  Height:        Intake/Output Summary (Last 24 hours) at 09/02/2018 1216 Last data filed at 09/02/2018 0920 Gross per 24 hour  Intake 241.78 ml  Output 2500 ml  Net -2258.22 ml   Filed Weights   08/31/18 0500 09/01/18 4742 09/02/18 0513  Weight: 75.5 kg 75.8 kg 75.2 kg   Examination: Physical Exam:  Constitutional: Well-nourished, well-developed obese elderly Caucasian female  currently no acute distress appears calm and comfortable Eyes: Lids and conjunctive are normal.  Sclera anicteric ENMT: External ears nose appear normal.  Grossly normal Neck: Appears supple no JVD Respiratory: Diminished auscultation bilaterally with no appreciable wheezing, rales, rhonchi.  Patient not tachypneic or using accessory muscle breathe and had unlabored breathing Cardiovascular: Regular rate and rhythm.  Has a slight murmur.  No appreciable lower extremity edema Abdomen: Soft, nontender, distended secondary body habitus.  Bowel sounds present GU: Deferred Musculoskeletal: No contractures or cyanosis.  No joint deformities in the upper and lower extremity Skin: Skin is warm dry no appreciable rashes or lesions on physical evaluation Neurologic: Cranial nerves II through XII grossly intact with no appreciable focal deficits Psychiatric: Normal judgment and insight.  Patient is awake, alert, oriented.  Has a pleasant mood affect  Data Reviewed: I have personally reviewed following labs and imaging studies  CBC: Recent Labs  Lab 08/30/18 1905 08/31/18 0823 09/01/18 0902 09/02/18 0529  WBC 10.8* 6.3 5.5 6.3  NEUTROABS 8.8* 4.8  3.2 3.9  HGB 13.6 13.1 13.4 13.0  HCT 42.2 40.4 42.2 40.8  MCV 80.8 82.4 83.2 81.6  PLT 223 192 206 283   Basic Metabolic Panel: Recent Labs  Lab 08/30/18 1905 08/31/18 0816 08/31/18 0823 09/01/18 0902 09/02/18 0529  NA 134* 137  --  135 138  K 4.3 4.1  --  4.3 4.0  CL 98 102  --  100 106  CO2 24 27  --  25 24  GLUCOSE 255* 208*  --  228* 173*  BUN 18 14  --  15 17  CREATININE 1.25* 0.91  --  0.95 0.87  CALCIUM 8.8* 8.7*  --  8.8* 8.6*  MG  --   --  2.1 1.9 2.0  PHOS  --   --  3.8 3.5 3.8   GFR: Estimated Creatinine Clearance: 44.4 mL/min (by C-G formula based on SCr of 0.87 mg/dL). Liver Function Tests: Recent Labs  Lab 08/30/18 1905 08/31/18 0816 09/01/18 0902 09/02/18 0529  AST 19 14* 19 15  ALT 13 11 10 11   ALKPHOS 63 53 51  44  BILITOT 0.8 0.7 0.4 0.4  PROT 7.6 6.7 6.8 6.5  ALBUMIN 3.7 3.4* 3.3* 3.2*   No results for input(s): LIPASE, AMYLASE in the last 168 hours. No results for input(s): AMMONIA in the last 168 hours. Coagulation Profile: No results for input(s): INR, PROTIME in the last 168 hours. Cardiac Enzymes: Recent Labs  Lab 08/30/18 1905  TROPONINI <0.03   BNP (last 3 results) No results for input(s): PROBNP in the last 8760 hours. HbA1C: Recent Labs    08/30/18 1905  HGBA1C 9.8*   CBG: Recent Labs  Lab 09/01/18 1110 09/01/18 1605 09/01/18 2143 09/02/18 0732 09/02/18 1118  GLUCAP 162* 155* 178* 167* 186*   Lipid Profile: No results for input(s): CHOL, HDL, LDLCALC, TRIG, CHOLHDL, LDLDIRECT in the last 72 hours. Thyroid Function Tests: No results for input(s): TSH, T4TOTAL, FREET4, T3FREE, THYROIDAB in the last 72 hours. Anemia Panel: No results for input(s): VITAMINB12, FOLATE, FERRITIN, TIBC, IRON, RETICCTPCT in the last 72 hours. Sepsis Labs: No results for input(s): PROCALCITON, LATICACIDVEN in the last 168 hours.  Recent Results (from the past 240 hour(s))  Urine Culture     Status: Abnormal (Preliminary result)   Collection Time: 08/30/18  7:42 PM  Result Value Ref Range Status   Specimen Description   Final    URINE, CATHETERIZED Performed at [facility], [address], [place], [place] [address]    Special Requests   Final    NONE Performed at [facility], [address] [place], [place] [address]    Culture (A)  Final    >=100,000 COLONIES/mL KLEBSIELLA SPECIES SUSCEPTIBILITIES TO FOLLOW Performed at [facility], [place] [address], [place], [place] [address]    Report Status PENDING  Incomplete  SARS Coronavirus 2 (CEPHEID- Performed in [place] hospital lab), Hosp Order     Status: None   Collection Time: 08/30/18  9:00 PM  Result Value Ref Range Status   SARS Coronavirus 2 NEGATIVE NEGATIVE Final    Comment: (NOTE) If result is  NEGATIVE SARS-CoV-2 target nucleic acids are NOT DETECTED. The SARS-CoV-2 RNA is generally detectable in upper and lower  respiratory specimens during the acute phase of infection. The lowest  concentration of SARS-CoV-2 viral copies this assay can detect is 250  copies / mL. A negative result does not preclude SARS-CoV-2 infection  and should not be used as the  sole basis for treatment or other  patient management decisions.  A negative result may occur with  improper specimen collection / handling, submission of specimen other  than nasopharyngeal swab, presence of viral mutation(s) within the  areas targeted by this assay, and inadequate number of viral copies  (<250 copies / mL). A negative result must be combined with clinical  observations, patient history, and epidemiological information. If result is POSITIVE SARS-CoV-2 target nucleic acids are DETECTED. The SARS-CoV-2 RNA is generally detectable in upper and lower  respiratory specimens dur ing the acute phase of infection.  Positive  results are indicative of active infection with SARS-CoV-2.  Clinical  correlation with patient history and other diagnostic information is  necessary to determine patient infection status.  Positive results do  not rule out bacterial infection or co-infection with other viruses. If result is PRESUMPTIVE POSTIVE SARS-CoV-2 nucleic acids MAY BE PRESENT.   A presumptive positive result was obtained on the submitted specimen  and confirmed on repeat testing.  While 2019 novel coronavirus  (SARS-CoV-2) nucleic acids may be present in the submitted sample  additional confirmatory testing may be necessary for epidemiological  and / or clinical management purposes  to differentiate between  SARS-CoV-2 and other Sarbecovirus currently known to infect humans.  If clinically indicated additional testing with an alternate test  methodology [phone]) is advised. The SARS-CoV-2 RNA is generally  detectable  in upper and lower respiratory sp ecimens during the acute  phase of infection. The expected result is Negative. Fact Sheet for Patients:  StrictlyIdeas.no Fact Sheet for Healthcare Providers: BankingDealers.co.za This test is not yet approved or cleared by the [place] FDA and has been authorized for detection and/or diagnosis of SARS-CoV-2 by FDA under an Emergency Use Authorization (EUA).  This EUA will remain in effect (meaning this test can be used) for the duration of the COVID-19 declaration under Section 564(b)(1) of the Act, 21 U.S.C. section 360bbb-3(b)(1), unless the authorization is terminated or revoked sooner. Performed at [facility], [address], [place], [place] [address]   Culture, blood (routine x 2)     Status: None (Preliminary result)   Collection Time: 08/31/18  9:51 AM  Result Value Ref Range Status   Specimen Description BLOOD RIGHT ARM BOTTLES DRAWN AEROBIC ONLY  Final   Special Requests Blood Culture adequate volume  Final   Culture   Final    NO GROWTH 2 DAYS Performed at [facility], [address] [place], [place] [address]    Report Status PENDING  Incomplete  Culture, blood (routine x 2)     Status: None (Preliminary result)   Collection Time: 08/31/18 10:00 AM  Result Value Ref Range Status   Specimen Description   Final    BLOOD RIGHT ARM BOTTLES DRAWN AEROBIC AND ANAEROBIC   Special Requests   Final    Blood Culture results may not be optimal due to an inadequate volume of blood received in culture bottles   Culture   Final    NO GROWTH 2 DAYS Performed at [facility], [address], [place],  [address]    Report Status PENDING  Incomplete    Radiology Studies: No results found. Scheduled Meds: . enoxaparin (LOVENOX) injection  40 mg Subcutaneous Q24H  . insulin aspart  0-5 Units Subcutaneous QHS  . insulin aspart  0-9 Units Subcutaneous TID WC  . levothyroxine  112 mcg Oral  QAC breakfast  . metFORMIN  1,000 mg Oral BID WC  .  rosuvastatin  20 mg Oral Daily  . vitamin B-12  1,000 mcg Oral Daily   Continuous Infusions: . cefTRIAXone (ROCEPHIN)  IV 1 g (09/01/18 2237)    LOS: 0 days   Kerney Elbe, DO [facility] PAGER is on [place]  If 7PM-7AM, please contact night-coverage [email or web address] Password [email or web address] 09/02/2018, 12:16 PM

## 2018-09-03 DIAGNOSIS — N183 Chronic kidney disease, stage 3 (moderate): Secondary | ICD-10-CM | POA: Diagnosis not present

## 2018-09-03 DIAGNOSIS — N390 Urinary tract infection, site not specified: Secondary | ICD-10-CM

## 2018-09-03 DIAGNOSIS — I2510 Atherosclerotic heart disease of native coronary artery without angina pectoris: Secondary | ICD-10-CM

## 2018-09-03 DIAGNOSIS — E871 Hypo-osmolality and hyponatremia: Secondary | ICD-10-CM | POA: Diagnosis not present

## 2018-09-03 DIAGNOSIS — E1122 Type 2 diabetes mellitus with diabetic chronic kidney disease: Secondary | ICD-10-CM

## 2018-09-03 LAB — URINE CULTURE: Culture: >=100000 — AB

## 2018-09-03 LAB — COMPREHENSIVE METABOLIC PANEL
ALT: 12 U/L (ref 0–44)
AST: 15 U/L (ref 15–41)
Albumin: 3.2 g/dL — ABNORMAL LOW (ref 3.5–5.0)
Alkaline Phosphatase: 48 U/L (ref 38–126)
Anion gap: 10 (ref 5–15)
BUN: 18 mg/dL (ref 8–23)
CO2: 24 mmol/L (ref 22–32)
Calcium: 8.9 mg/dL (ref 8.9–10.3)
Chloride: 105 mmol/L (ref 98–111)
Creatinine, Ser: 0.84 mg/dL (ref 0.44–1.00)
GFR calc Af Amer: >60 mL/min (ref >60)
GFR calc non Af Amer: >60 mL/min (ref >60)
Glucose, Bld: 168 mg/dL — ABNORMAL HIGH (ref 70–99)
Potassium: 3.9 mmol/L (ref 3.5–5.1)
Sodium: 139 mmol/L (ref 135–145)
Total Bilirubin: 0.3 mg/dL (ref 0.3–1.2)
Total Protein: 6.4 g/dL — ABNORMAL LOW (ref 6.5–8.1)

## 2018-09-03 LAB — PHOSPHORUS: Phosphorus: 4.1 mg/dL (ref 2.5–4.6)

## 2018-09-03 LAB — GLUCOSE, CAPILLARY
Glucose-Capillary: 157 mg/dL — ABNORMAL HIGH (ref 70–99)
Glucose-Capillary: 127 mg/dL — ABNORMAL HIGH (ref 70–99)
Glucose-Capillary: 132 mg/dL — ABNORMAL HIGH (ref 70–99)

## 2018-09-03 LAB — CBC WITH DIFFERENTIAL/PLATELET
Abs Immature Granulocytes: 0.04 10*3/uL (ref 0.00–0.07)
Basophils Absolute: 0 10*3/uL (ref 0.0–0.1)
Basophils Relative: 0 %
Eosinophils Absolute: 0.2 10*3/uL (ref 0.0–0.5)
Eosinophils Relative: 3 %
HCT: 40.2 % (ref 36.0–46.0)
Hemoglobin: 12.6 g/dL (ref 12.0–15.0)
Immature Granulocytes: 1 %
Lymphocytes Relative: 31 %
Lymphs Abs: 2.2 10*3/uL (ref 0.7–4.0)
MCH: 26 pg (ref 26.0–34.0)
MCHC: 31.3 g/dL (ref 30.0–36.0)
MCV: 82.9 fL (ref 80.0–100.0)
Monocytes Absolute: 0.5 10*3/uL (ref 0.1–1.0)
Monocytes Relative: 7 %
Neutro Abs: 4.1 10*3/uL (ref 1.7–7.7)
Neutrophils Relative %: 58 %
Platelets: 216 10*3/uL (ref 150–400)
RBC: 4.85 MIL/uL (ref 3.87–5.11)
RDW: 15.1 % (ref 11.5–15.5)
WBC: 7.2 10*3/uL (ref 4.0–10.5)
nRBC: 0 % (ref 0.0–0.2)

## 2018-09-03 LAB — MAGNESIUM: Magnesium: 1.9 mg/dL (ref 1.7–2.4)

## 2018-09-03 MED ORDER — AMOXICILLIN-POT CLAVULANATE 500-125 MG PO TABS: 1.0000 | ORAL_TABLET | Freq: Two times a day (BID) | ORAL | 0 refills | Status: AC

## 2018-09-03 MED ORDER — AMOXICILLIN-POT CLAVULANATE 500-125 MG PO TABS
1.0000 | ORAL_TABLET | Freq: Two times a day (BID) | ORAL | Status: DC
  Administered 2018-09-03: 500 mg via ORAL
  Filled 2018-09-03: qty 1

## 2018-09-03 NOTE — Progress Notes (Signed)
SATURATION QUALIFICATIONS: (This note is used to comply with regulatory documentation for home oxygen)  Patient Saturations on Room Air at Rest = 96%  Patient Saturations on Room Air while Ambulating =  95%  Patient Saturations on 0 Liters of oxygen while Ambulating  95%  Please briefly explain why patient needs home oxygen:  

## 2018-09-03 NOTE — Discharge Summary (Signed)
Physician Discharge Summary  Pearlene Teat RCV:[id number] DOB: [birth year] DOA: 08/30/2018  PCP: Dettinger, Fransisca Kaufmann, MD  Admit date: 08/30/2018 Discharge date: 09/03/2018  Admitted From:  Home  Disposition: Home   Recommendations for Outpatient Follow-up:  1. Follow up with PCP in 1 weeks  Discharge Condition: STABLE   CODE STATUS: FULL    Brief Hospitalization Summary: Please see all hospital notes, images, labs for full details of the hospitalization. Dr. Julianne Rice HPI:  Leah Levine  is a 85 y.o. female, w hypertension, hyperlipidemia, Dm2, CAD s/p CABG, h/o CVA with residual expressive aphasia, Jerrye Bushy, Hypothyroidism, B 12 deficiency apparently had fever 102 at home, and felt generally weak.  Pt notes that she has had increase in frequency of urination but denies dysuria/ hematuria, or flank pain.  There was some question of sob, but pt denies being sob presently.   Pt notes slight dry cough. Pt denies cp, palp, n/v, abd pain, diarrhea, brbpr, black stool.  Pt presented for evaluation of fever/ weakness (generalized)  In ED,  T 97.9  P 74  R 18  Bp 139/76  Pox 92-100% on RA Wt 80kg  CT brain IMPRESSION: 1. No acute intracranial hemorrhage. 2. Age-related atrophy and chronic microvascular ischemic changes.  CTA chest IMPRESSION: 1. No evidence of pulmonary embolism. 2. No acute findings in the thorax to account for the patient's symptoms. 3. Aortic atherosclerosis, in addition to left main and 3 vessel coronary artery disease. Status post median sternotomy for CABG including LIMA to the LAD. 4. Old granulomatous disease, as above.  Covid -19 negative  BNP 41 Wbc 10.8, Hgb 13.6, Plt 223 Na 134, K 4.3, Bun 18, Creatinine 1.25 Calcium 8.8, Glucose 255 Trop <0.03  Urinalysis  Wbc 21-50, rbc 0-5 N+, LE+  Pt will be admitted for fever secondary to acute UTI, ? Slight mild confusion from UTI, and mild hyponatremia.  Pt responded well to supportive care and  antibiotics.  Pt's urine culture tested positive for Klebsiella infection that is sensitive to Augmentin.  She will be discharged on 2 additional days of oral Augmentin.  She is clinically improved to her baseline.  She is feeling much better and she would like to go home.  I have recommended that she have an outpatient follow-up with her primary care physician in a week for recheck.  The patient verbalized understanding.   Discharge Diagnoses:  Principal Problem:   Acute lower UTI Active Problems:   Type 2 diabetes mellitus with renal manifestations (HCC)   Coronary artery disease   Hyponatremia   Acute UTI   Discharge Instructions: Discharge Instructions    Call MD for:  difficulty breathing, headache or visual disturbances   Complete by:  As directed    Call MD for:  persistant dizziness or light-headedness   Complete by:  As directed    Call MD for:  persistant nausea and vomiting   Complete by:  As directed    Call MD for:  severe uncontrolled pain   Complete by:  As directed    Increase activity slowly   Complete by:  As directed      Allergies as of 09/03/2018      Reactions   Celebrex [celecoxib] Other (See Comments)   Muscle aches and leg pains   Fenofibrate Other (See Comments)   Muscle aches and leg pains   Statins Other (See Comments)   Myalgias; muscle aches and leg pains (Atoravastatin, Simvastatin) - daugther does not recall Crestor trial, williing  to try 06/21/16      Medication List    TAKE these medications   amoxicillin-clavulanate 500-125 MG tablet Commonly known as:  AUGMENTIN Take 1 tablet (500 mg total) by mouth every 12 (twelve) hours for 2 days.   levothyroxine 112 MCG tablet Commonly known as:  SYNTHROID TAKE 1 TABLET ONCE DAILY BEFORE BREAKFAST What changed:  See the new instructions.   metFORMIN 1000 MG tablet Commonly known as:  GLUCOPHAGE TAKE (1) TABLET TWICE A DAY WITH MEALS (BREAKFAST AND SUPPER) What changed:  See the new  instructions.   rosuvastatin 20 MG tablet Commonly known as:  CRESTOR TAKE 1 TABLET DAILY      Follow-up Information    Dettinger, Fransisca Kaufmann, MD. Schedule an appointment as soon as possible for a visit in 1 week(s).   Specialties:  Family Medicine, Cardiology Why:  Hospital Follow Up  Contact information: [place] [address] [phone]          Allergies  Allergen Reactions  . Celebrex [Celecoxib] Other (See Comments)    Muscle aches and leg pains  . Fenofibrate Other (See Comments)    Muscle aches and leg pains  . Statins Other (See Comments)    Myalgias; muscle aches and leg pains (Atoravastatin, Simvastatin) - daugther does not recall Crestor trial, williing to try 06/21/16   Allergies as of 09/03/2018      Reactions   Celebrex [celecoxib] Other (See Comments)   Muscle aches and leg pains   Fenofibrate Other (See Comments)   Muscle aches and leg pains   Statins Other (See Comments)   Myalgias; muscle aches and leg pains (Atoravastatin, Simvastatin) - daugther does not recall Crestor trial, williing to try 06/21/16      Medication List    TAKE these medications   amoxicillin-clavulanate 500-125 MG tablet Commonly known as:  AUGMENTIN Take 1 tablet (500 mg total) by mouth every 12 (twelve) hours for 2 days.   levothyroxine 112 MCG tablet Commonly known as:  SYNTHROID TAKE 1 TABLET ONCE DAILY BEFORE BREAKFAST What changed:  See the new instructions.   metFORMIN 1000 MG tablet Commonly known as:  GLUCOPHAGE TAKE (1) TABLET TWICE A DAY WITH MEALS (BREAKFAST AND SUPPER) What changed:  See the new instructions.   rosuvastatin 20 MG tablet Commonly known as:  CRESTOR TAKE 1 TABLET DAILY       Procedures/Studies: Ct Head Wo Contrast  Result Date: 08/30/2018 CLINICAL DATA:  84 year old female with generalized weakness and altered mental status. EXAM: CT HEAD WITHOUT CONTRAST TECHNIQUE: Contiguous axial images were obtained from the base of the  skull through the vertex without intravenous contrast. COMPARISON:  Head CT dated 06/20/2016 and MRI dated 06/04/2018 FINDINGS: Brain: There is moderate age-related atrophy and chronic microvascular ischemic changes. There is no acute intracranial hemorrhage. No mass effect or midline shift. No extra-axial fluid collection. Vascular: No hyperdense vessel or unexpected calcification. Skull: Normal. Negative for fracture or focal lesion. Sinuses/Orbits: No acute finding. Other: None IMPRESSION: 1. No acute intracranial hemorrhage. 2. Age-related atrophy and chronic microvascular ischemic changes. Electronically Signed   By: Anner Crete M.D.   On: 08/30/2018 23:48   Ct Angio Chest Pe W And/or Wo Contrast  Result Date: 08/30/2018 CLINICAL DATA:  85 year old female with history of shortness of breath. EXAM: CT ANGIOGRAPHY CHEST WITH CONTRAST TECHNIQUE: Multidetector CT imaging of the chest was performed using the standard protocol during bolus administration of intravenous contrast. Multiplanar CT image reconstructions and MIPs  were obtained to evaluate the vascular anatomy. CONTRAST:  75 mL of Omnipaque 350. COMPARISON:  Chest CT 02/21/2005. FINDINGS: Cardiovascular: No filling defects within the pulmonary arterial tree to suggest underlying pulmonary embolism. Heart size is normal. There is no significant pericardial fluid, thickening or pericardial calcification. There is aortic atherosclerosis, as well as atherosclerosis of the great vessels of the mediastinum and the coronary arteries, including calcified atherosclerotic plaque in the left main, left anterior descending, left circumflex and right coronary arteries. Status post median sternotomy for CABG including LIMA to the LAD. Mediastinum/Nodes: No pathologically enlarged mediastinal or hilar lymph nodes. Esophagus is unremarkable in appearance. No axillary lymphadenopathy. Lungs/Pleura: Large calcified granuloma in the left upper lobe measuring 1 cm. No  other suspicious appearing pulmonary nodules or masses are noted. No acute consolidative airspace disease. No pleural effusions. Upper Abdomen: Aortic atherosclerosis. Several calcified granulomas throughout the spleen. Musculoskeletal: Median sternotomy wires. There are no aggressive appearing lytic or blastic lesions noted in the visualized portions of the skeleton. Review of the MIP images confirms the above findings. IMPRESSION: 1. No evidence of pulmonary embolism. 2. No acute findings in the thorax to account for the patient's symptoms. 3. Aortic atherosclerosis, in addition to left main and 3 vessel coronary artery disease. Status post median sternotomy for CABG including LIMA to the LAD. 4. Old granulomatous disease, as above. Aortic Atherosclerosis (ICD10-I70.0). Electronically Signed   By: Vinnie Langton M.D.   On: 08/30/2018 21:49   Dg Chest Port 1 View  Result Date: 08/30/2018 CLINICAL DATA:  85 year old female with history of shortness of breath. EXAM: PORTABLE CHEST 1 VIEW COMPARISON:  Chest x-ray 06/04/2018. FINDINGS: Lung volumes are low. No consolidative airspace disease. No pleural effusions. No pneumothorax. Small calcified granuloma in the left mid lung, unchanged. No other suspicious pulmonary nodule or mass noted. Pulmonary vasculature and the cardiomediastinal silhouette are within normal limits. Aortic atherosclerosis. Status post median sternotomy for CABG. Cerclage wire seen projecting over the lower cervical region incidentally noted. Electronic device projecting over the cardiac ventricles, potentially an implanted pacemaker. IMPRESSION: 1. Low lung volumes without radiographic evidence of acute cardiopulmonary disease. 2. Aortic atherosclerosis. 3. Postoperative changes, as above. Electronically Signed   By: Vinnie Langton M.D.   On: 08/30/2018 20:17      Subjective: Patient is without complaints.  She says she feels much better.  She is oriented x3.  Discharge  Exam: Vitals:   09/02/18 2146 09/03/18 0502  BP: (!) 157/80 139/68  Pulse: 86 75  Resp: 20 16  Temp: 98.2 F (36.8 C) 98.5 F (36.9 C)  SpO2: 96% 96%   Vitals:   09/02/18 0513 09/02/18 1442 09/02/18 2146 09/03/18 0502  BP: (!) 157/74 (!) 159/78 (!) 157/80 139/68  Pulse: 79 76 86 75  Resp: (!) 24 16 20 16   Temp: 98.6 F (37 C)  98.2 F (36.8 C) 98.5 F (36.9 C)  TempSrc: Oral  Oral Oral  SpO2: 97% 99% 96% 96%  Weight: 75.2 kg   75.4 kg  Height:       General: Pt is alert, awake, not in acute distress Cardiovascular: normal S1/S2 +, no rubs, no gallops Respiratory: CTA bilaterally, no wheezing, no rhonchi Abdominal: Soft, NT, ND, bowel sounds + Extremities: no cyanosis   The results of significant diagnostics from this hospitalization (including imaging, microbiology, ancillary and laboratory) are listed below for reference.     Microbiology: Recent Results (from the past 240 hour(s))  Urine Culture  Status: Abnormal   Collection Time: 08/30/18  7:42 PM  Result Value Ref Range Status   Specimen Description   Final    URINE, CATHETERIZED Performed at [facility], [address] [place], [place] [address]    Special Requests   Final    NONE Performed at [facility], [address] [place], [place] [address]    Culture >=100,000 COLONIES/mL KLEBSIELLA PNEUMONIAE (A)  Final   Report Status 09/03/2018 FINAL  Final   Organism ID, Bacteria KLEBSIELLA PNEUMONIAE (A)  Final      Susceptibility   Klebsiella pneumoniae - MIC*    AMPICILLIN RESISTANT Resistant     CEFAZOLIN <=4 SENSITIVE Sensitive     CEFTRIAXONE <=1 SENSITIVE Sensitive     CIPROFLOXACIN <=0.25 SENSITIVE Sensitive     GENTAMICIN <=1 SENSITIVE Sensitive     IMIPENEM <=0.25 SENSITIVE Sensitive     NITROFURANTOIN 32 SENSITIVE Sensitive     TRIMETH/SULFA <=20 SENSITIVE Sensitive     AMPICILLIN/SULBACTAM <=2 SENSITIVE Sensitive     PIP/TAZO <=4 SENSITIVE Sensitive     Extended ESBL NEGATIVE Sensitive      * >=100,000 COLONIES/mL KLEBSIELLA PNEUMONIAE  SARS Coronavirus 2 (CEPHEID- Performed in [place] hospital lab), Hosp Order     Status: None   Collection Time: 08/30/18  9:00 PM  Result Value Ref Range Status   SARS Coronavirus 2 NEGATIVE NEGATIVE Final    Comment: (NOTE) If result is NEGATIVE SARS-CoV-2 target nucleic acids are NOT DETECTED. The SARS-CoV-2 RNA is generally detectable in upper and lower  respiratory specimens during the acute phase of infection. The lowest  concentration of SARS-CoV-2 viral copies this assay can detect is 250  copies / mL. A negative result does not preclude SARS-CoV-2 infection  and should not be used as the sole basis for treatment or other  patient management decisions.  A negative result may occur with  improper specimen collection / handling, submission of specimen other  than nasopharyngeal swab, presence of viral mutation(s) within the  areas targeted by this assay, and inadequate number of viral copies  (<250 copies / mL). A negative result must be combined with clinical  observations, patient history, and epidemiological information. If result is POSITIVE SARS-CoV-2 target nucleic acids are DETECTED. The SARS-CoV-2 RNA is generally detectable in upper and lower  respiratory specimens dur ing the acute phase of infection.  Positive  results are indicative of active infection with SARS-CoV-2.  Clinical  correlation with patient history and other diagnostic information is  necessary to determine patient infection status.  Positive results do  not rule out bacterial infection or co-infection with other viruses. If result is PRESUMPTIVE POSTIVE SARS-CoV-2 nucleic acids MAY BE PRESENT.   A presumptive positive result was obtained on the submitted specimen  and confirmed on repeat testing.  While 2019 novel coronavirus  (SARS-CoV-2) nucleic acids may be present in the submitted sample  additional confirmatory testing may be necessary for  epidemiological  and / or clinical management purposes  to differentiate between  SARS-CoV-2 and other Sarbecovirus currently known to infect humans.  If clinically indicated additional testing with an alternate test  methodology [phone]) is advised. The SARS-CoV-2 RNA is generally  detectable in upper and lower respiratory sp ecimens during the acute  phase of infection. The expected result is Negative. Fact Sheet for Patients:  StrictlyIdeas.no Fact Sheet for Healthcare Providers: BankingDealers.co.za This test is not yet approved or cleared by the [place] FDA and has been authorized for  detection and/or diagnosis of SARS-CoV-2 by FDA under an Emergency Use Authorization (EUA).  This EUA will remain in effect (meaning this test can be used) for the duration of the COVID-19 declaration under Section 564(b)(1) of the Act, 21 U.S.C. section 360bbb-3(b)(1), unless the authorization is terminated or revoked sooner. Performed at [facility], [address], [place], [place] [address]   Culture, blood (routine x 2)     Status: None (Preliminary result)   Collection Time: 08/31/18  9:51 AM  Result Value Ref Range Status   Specimen Description BLOOD RIGHT ARM BOTTLES DRAWN AEROBIC ONLY  Final   Special Requests Blood Culture adequate volume  Final   Culture   Final    NO GROWTH 3 DAYS Performed at [facility], [address] [place], [place] [address]    Report Status PENDING  Incomplete  Culture, blood (routine x 2)     Status: None (Preliminary result)   Collection Time: 08/31/18 10:00 AM  Result Value Ref Range Status   Specimen Description   Final    BLOOD RIGHT ARM BOTTLES DRAWN AEROBIC AND ANAEROBIC   Special Requests   Final    Blood Culture results may not be optimal due to an inadequate volume of blood received in culture bottles   Culture   Final    NO GROWTH 3 DAYS Performed at [facility], [address],  [place],  [address]    Report Status PENDING  Incomplete     Labs: BNP (last 3 results) Recent Labs    08/30/18 1905  BNP 40.1   Basic Metabolic Panel: Recent Labs  Lab 08/30/18 1905 08/31/18 0816 08/31/18 0823 09/01/18 0902 09/02/18 0529 09/03/18 0438  NA 134* 137  --  135 138 139  K 4.3 4.1  --  4.3 4.0 3.9  CL 98 102  --  100 106 105  CO2 24 27  --  25 24 24   GLUCOSE 255* 208*  --  228* 173* 168*  BUN 18 14  --  15 17 18   CREATININE 1.25* 0.91  --  0.95 0.87 0.84  CALCIUM 8.8* 8.7*  --  8.8* 8.6* 8.9  MG  --   --  2.1 1.9 2.0 1.9  PHOS  --   --  3.8 3.5 3.8 4.1   Liver Function Tests: Recent Labs  Lab 08/30/18 1905 08/31/18 0816 09/01/18 0902 09/02/18 0529 09/03/18 0438  AST 19 14* 19 15 15   ALT 13 11 10 11 12   ALKPHOS 63 53 51 44 48  BILITOT 0.8 0.7 0.4 0.4 0.3  PROT 7.6 6.7 6.8 6.5 6.4*  ALBUMIN 3.7 3.4* 3.3* 3.2* 3.2*   No results for input(s): LIPASE, AMYLASE in the last 168 hours. No results for input(s): AMMONIA in the last 168 hours. CBC: Recent Labs  Lab 08/30/18 1905 08/31/18 0823 09/01/18 0902 09/02/18 0529 09/03/18 0438  WBC 10.8* 6.3 5.5 6.3 7.2  NEUTROABS 8.8* 4.8 3.2 3.9 4.1  HGB 13.6 13.1 13.4 13.0 12.6  HCT 42.2 40.4 42.2 40.8 40.2  MCV 80.8 82.4 83.2 81.6 82.9  PLT 223 192 206 209 216   Cardiac Enzymes: Recent Labs  Lab 08/30/18 1905  TROPONINI <0.03   BNP: Invalid input(s): POCBNP CBG: Recent Labs  Lab 09/02/18 0732 09/02/18 1118 09/02/18 1616 09/02/18 2146 09/03/18 0752  GLUCAP 167* 186* 99 151* 157*   D-Dimer No results for input(s): DDIMER in the last 72 hours. Hgb A1c No results for input(s): HGBA1C in the  last 72 hours. Lipid Profile No results for input(s): CHOL, HDL, LDLCALC, TRIG, CHOLHDL, LDLDIRECT in the last 72 hours. Thyroid function studies No results for input(s): TSH, T4TOTAL, T3FREE, THYROIDAB in the last 72 hours.  Invalid input(s): FREET3 Anemia work up No results for input(s):  VITAMINB12, FOLATE, FERRITIN, TIBC, IRON, RETICCTPCT in the last 72 hours. Urinalysis    Component Value Date/Time   COLORURINE YELLOW 08/30/2018 1942   APPEARANCEUR HAZY (A) 08/30/2018 1942   APPEARANCEUR Clear 08/10/2016 1357   LABSPEC 1.017 08/30/2018 1942   PHURINE 5.0 08/30/2018 1942   GLUCOSEU 50 (A) 08/30/2018 1942   HGBUR NEGATIVE 08/30/2018 1942   BILIRUBINUR NEGATIVE 08/30/2018 1942   BILIRUBINUR Negative 08/10/2016 1357   KETONESUR NEGATIVE 08/30/2018 1942   PROTEINUR NEGATIVE 08/30/2018 1942   UROBILINOGEN negative 04/05/2015 1147   UROBILINOGEN 0.2 12/09/2013 1820   NITRITE POSITIVE (A) 08/30/2018 1942   LEUKOCYTESUR SMALL (A) 08/30/2018 1942   Sepsis Labs Invalid input(s): PROCALCITONIN,  WBC,  LACTICIDVEN Microbiology Recent Results (from the past 240 hour(s))  Urine Culture     Status: Abnormal   Collection Time: 08/30/18  7:42 PM  Result Value Ref Range Status   Specimen Description   Final    URINE, CATHETERIZED Performed at [facility], [address], [place], [place] [address]    Special Requests   Final    NONE Performed at [facility], [address] [place], [place] [address]    Culture >=100,000 COLONIES/mL KLEBSIELLA PNEUMONIAE (A)  Final   Report Status 09/03/2018 FINAL  Final   Organism ID, Bacteria KLEBSIELLA PNEUMONIAE (A)  Final      Susceptibility   Klebsiella pneumoniae - MIC*    AMPICILLIN RESISTANT Resistant     CEFAZOLIN <=4 SENSITIVE Sensitive     CEFTRIAXONE <=1 SENSITIVE Sensitive     CIPROFLOXACIN <=0.25 SENSITIVE Sensitive     GENTAMICIN <=1 SENSITIVE Sensitive     IMIPENEM <=0.25 SENSITIVE Sensitive     NITROFURANTOIN 32 SENSITIVE Sensitive     TRIMETH/SULFA <=20 SENSITIVE Sensitive     AMPICILLIN/SULBACTAM <=2 SENSITIVE Sensitive     PIP/TAZO <=4 SENSITIVE Sensitive     Extended ESBL NEGATIVE Sensitive     * >=100,000 COLONIES/mL KLEBSIELLA PNEUMONIAE  SARS Coronavirus 2 (CEPHEID- Performed in [place] hospital lab),  Hosp Order     Status: None   Collection Time: 08/30/18  9:00 PM  Result Value Ref Range Status   SARS Coronavirus 2 NEGATIVE NEGATIVE Final    Comment: (NOTE) If result is NEGATIVE SARS-CoV-2 target nucleic acids are NOT DETECTED. The SARS-CoV-2 RNA is generally detectable in upper and lower  respiratory specimens during the acute phase of infection. The lowest  concentration of SARS-CoV-2 viral copies this assay can detect is 250  copies / mL. A negative result does not preclude SARS-CoV-2 infection  and should not be used as the sole basis for treatment or other  patient management decisions.  A negative result may occur with  improper specimen collection / handling, submission of specimen other  than nasopharyngeal swab, presence of viral mutation(s) within the  areas targeted by this assay, and inadequate number of viral copies  (<250 copies / mL). A negative result must be combined with clinical  observations, patient history, and epidemiological information. If result is POSITIVE SARS-CoV-2 target nucleic acids are DETECTED. The SARS-CoV-2 RNA is generally detectable in upper and lower  respiratory specimens dur ing the acute phase of infection.  Positive  results are indicative  of active infection with SARS-CoV-2.  Clinical  correlation with patient history and other diagnostic information is  necessary to determine patient infection status.  Positive results do  not rule out bacterial infection or co-infection with other viruses. If result is PRESUMPTIVE POSTIVE SARS-CoV-2 nucleic acids MAY BE PRESENT.   A presumptive positive result was obtained on the submitted specimen  and confirmed on repeat testing.  While 2019 novel coronavirus  (SARS-CoV-2) nucleic acids may be present in the submitted sample  additional confirmatory testing may be necessary for epidemiological  and / or clinical management purposes  to differentiate between  SARS-CoV-2 and other Sarbecovirus  currently known to infect humans.  If clinically indicated additional testing with an alternate test  methodology [phone]) is advised. The SARS-CoV-2 RNA is generally  detectable in upper and lower respiratory sp ecimens during the acute  phase of infection. The expected result is Negative. Fact Sheet for Patients:  StrictlyIdeas.no Fact Sheet for Healthcare Providers: BankingDealers.co.za This test is not yet approved or cleared by the [place] FDA and has been authorized for detection and/or diagnosis of SARS-CoV-2 by FDA under an Emergency Use Authorization (EUA).  This EUA will remain in effect (meaning this test can be used) for the duration of the COVID-19 declaration under Section 564(b)(1) of the Act, 21 U.S.C. section 360bbb-3(b)(1), unless the authorization is terminated or revoked sooner. Performed at [facility], [address] [place], [place] [address]   Culture, blood (routine x 2)     Status: None (Preliminary result)   Collection Time: 08/31/18  9:51 AM  Result Value Ref Range Status   Specimen Description BLOOD RIGHT ARM BOTTLES DRAWN AEROBIC ONLY  Final   Special Requests Blood Culture adequate volume  Final   Culture   Final    NO GROWTH 3 DAYS Performed at [facility], [address] [place], [place] [address]    Report Status PENDING  Incomplete  Culture, blood (routine x 2)     Status: None (Preliminary result)   Collection Time: 08/31/18 10:00 AM  Result Value Ref Range Status   Specimen Description   Final    BLOOD RIGHT ARM BOTTLES DRAWN AEROBIC AND ANAEROBIC   Special Requests   Final    Blood Culture results may not be optimal due to an inadequate volume of blood received in culture bottles   Culture   Final    NO GROWTH 3 DAYS Performed at [facility], [address], [place], [place] [address]    Report Status PENDING  Incomplete   Time coordinating discharge:   SIGNED:  Irwin Brakeman,  MD  [facility] 09/03/2018, 10:22 AM How to contact the [facility] Attending or Consulting provider [place] or covering provider during after hours [place], for this patient?  1. Check the care team in [facility] and look for a) attending/consulting [facility] provider listed and b) the [facility] team listed 2. Log into [email or web address] and use 's universal password to access. If you do not have the password, please contact the hospital operator. 3. Locate the [facility] provider you are looking for under [facility] and page to a number that you can be directly reached. 4. If you still have difficulty reaching the provider, please page the [facility] (Director on Call) for the Hospitalists listed on amion for assistance.

## 2018-09-03 NOTE — Discharge Instructions (Signed)
Urinary Tract Infection, Adult A urinary tract infection (UTI) is an infection of any part of the urinary tract. The urinary tract includes:  The kidneys.  The ureters.  The bladder.  The urethra. These organs make, store, and get rid of pee (urine) in the body. What are the causes? This is caused by germs (bacteria) in your genital area. These germs grow and cause swelling (inflammation) of your urinary tract. What increases the risk? You are more likely to develop this condition if:  You have a small, thin tube (catheter) to drain pee.  You cannot control when you pee or poop (incontinence).  You are female, and: ? You use these methods to prevent pregnancy: ? A medicine that kills sperm (spermicide). ? A device that blocks sperm (diaphragm). ? You have low levels of a female hormone (estrogen). ? You are pregnant.  You have genes that add to your risk.  You are sexually active.  You take antibiotic medicines.  You have trouble peeing because of: ? A prostate that is bigger than normal, if you are female. ? A blockage in the part of your body that drains pee from the bladder (urethra). ? A kidney stone. ? A nerve condition that affects your bladder (neurogenic bladder). ? Not getting enough to drink. ? Not peeing often enough.  You have other conditions, such as: ? Diabetes. ? A weak disease-fighting system (immune system). ? Sickle cell disease. ? Gout. ? Injury of the spine. What are the signs or symptoms? Symptoms of this condition include:  Needing to pee right away (urgently).  Peeing often.  Peeing small amounts often.  Pain or burning when peeing.  Blood in the pee.  Pee that smells bad or not like normal.  Trouble peeing.  Pee that is cloudy.  Fluid coming from the vagina, if you are female.  Pain in the belly or lower back. Other symptoms include:  Throwing up (vomiting).  No urge to eat.  Feeling mixed up (confused).  Being tired  and grouchy (irritable).  A fever.  Watery poop (diarrhea). How is this treated? This condition may be treated with:  Antibiotic medicine.  Other medicines.  Drinking enough water. Follow these instructions at home:  Medicines  Take over-the-counter and prescription medicines only as told by your doctor.  If you were prescribed an antibiotic medicine, take it as told by your doctor. Do not stop taking it even if you start to feel better. General instructions  Make sure you: ? Pee until your bladder is empty. ? Do not hold pee for a long time. ? Empty your bladder after sex. ? Wipe from front to back after pooping if you are a female. Use each tissue one time when you wipe.  Drink enough fluid to keep your pee pale yellow.  Keep all follow-up visits as told by your doctor. This is important. Contact a doctor if:  You do not get better after 1-2 days.  Your symptoms go away and then come back. Get help right away if:  You have very bad back pain.  You have very bad pain in your lower belly.  You have a fever.  You are sick to your stomach (nauseous).  You are throwing up. Summary  A urinary tract infection (UTI) is an infection of any part of the urinary tract.  This condition is caused by germs in your genital area.  There are many risk factors for a UTI. These include having a small, thin  tube to drain pee and not being able to control when you pee or poop.  Treatment includes antibiotic medicines for germs.  Drink enough fluid to keep your pee pale yellow. This information is not intended to replace advice given to you by your health care provider. Make sure you discuss any questions you have with your health care provider. Document Released: 08/29/2007 Document Revised: 09/19/2017 Document Reviewed: 09/19/2017 Elsevier Interactive Patient Education  2019 Elsevier Inc.   IMPORTANT INFORMATION: PAY CLOSE ATTENTION   PHYSICIAN DISCHARGE  INSTRUCTIONS  Follow with Primary care provider  Dettinger, Fransisca Kaufmann, MD  and other consultants as instructed your Hospitalist Physician  SEEK MEDICAL CARE OR RETURN TO EMERGENCY ROOM IF SYMPTOMS COME BACK, WORSEN OR NEW PROBLEM DEVELOPS.   Please note: You were cared for by a hospitalist during your hospital stay. Every effort will be made to forward records to your primary care provider.  You can request that your primary care provider send for your hospital records if they have not received them.  Once you are discharged, your primary care physician will handle any further medical issues. Please note that NO REFILLS for any discharge medications will be authorized once you are discharged, as it is imperative that you return to your primary care physician (or establish a relationship with a primary care physician if you do not have one) for your post hospital discharge needs so that they can reassess your need for medications and monitor your lab values.  Please get a complete blood count and chemistry panel checked by your Primary MD at your next visit, and again as instructed by your Primary MD.  Get Medicines reviewed and adjusted: Please take all your medications with you for your next visit with your Primary MD  Laboratory/radiological data: Please request your Primary MD to go over all hospital tests and procedure/radiological results at the follow up, please ask your primary care provider to get all Hospital records sent to his/her office.  In some cases, they will be blood work, cultures and biopsy results pending at the time of your discharge. Please request that your primary care provider follow up on these results.  If you are diabetic, please bring your blood sugar readings with you to your follow up appointment with primary care.    Please call and make your follow up appointments as soon as possible.    Also Note the following: If you experience worsening of your admission  symptoms, develop shortness of breath, life threatening emergency, suicidal or homicidal thoughts you must seek medical attention immediately by calling 911 or calling your MD immediately  if symptoms less severe.  You must read complete instructions/literature along with all the possible adverse reactions/side effects for all the Medicines you take and that have been prescribed to you. Take any new Medicines after you have completely understood and accpet all the possible adverse reactions/side effects.   Do not drive when taking Pain medications or sleeping medications (Benzodiazepines)  Do not take more than prescribed Pain, Sleep and Anxiety Medications. It is not advisable to combine anxiety,sleep and pain medications without talking with your primary care practitioner  Special Instructions: If you have smoked or chewed Tobacco  in the last 2 yrs please stop smoking, stop any regular Alcohol  and or any Recreational drug use.  Wear Seat belts while driving.

## 2018-09-03 NOTE — Progress Notes (Signed)
Patient discharged home with instructions given om medications and follow up visits,patient verbalized understanding. Prescriptions are to be picked up at Pharmacy of choice documented on AVS. Accompanied by staff to an awaiting vehicle.

## 2018-09-03 NOTE — Progress Notes (Signed)
OT Cancellation Note  Patient Details Name: Leah Levine MRN: [id number] DOB: 13-Jan-1933   Cancelled Treatment:    Reason Eval/Treat Not Completed: OT screened, no needs identified, will sign off. Pt screened for OT needs. Pt is at baseline independence with ADL completion, has family to assist at home as needed. No further OT services required at this time.   Guadelupe Sabin, OTR/L  [phone] 09/03/2018, 7:15 AM

## 2018-09-03 NOTE — TOC Transition Note (Addendum)
Transition of Care [facility]) - CM/SW Discharge Note   Patient Details  Name: Leah Levine MRN: [id number] Date of Birth: 03/17/1933  [facility]) CM/SW Contact:  Boneta Lucks, RN Phone Number: 09/03/2018, 10:34 AM   Clinical Narrative:   Patient ready for discharge. Follow up appointment made with Dr. Warrick Parisian.  PT and OT have no recommendations.  Patient has children and family their support.     Final next level of care: Home/Self Care Barriers to Discharge: No Barriers Identified   Patient Goals and CMS Choice Patient states their goals for this hospitalization and ongoing recovery are:: to go home.      Discharge Placement                       Discharge Plan and Services   Discharge Planning Services: CM Consult                                      Readmission Risk Interventions No flowsheet data found.

## 2018-09-04 DIAGNOSIS — L84 Corns and callosities: Secondary | ICD-10-CM | POA: Diagnosis not present

## 2018-09-04 DIAGNOSIS — E1142 Type 2 diabetes mellitus with diabetic polyneuropathy: Secondary | ICD-10-CM | POA: Diagnosis not present

## 2018-09-04 DIAGNOSIS — B351 Tinea unguium: Secondary | ICD-10-CM | POA: Diagnosis not present

## 2018-09-04 DIAGNOSIS — M79676 Pain in unspecified toe(s): Secondary | ICD-10-CM | POA: Diagnosis not present

## 2018-09-05 LAB — CULTURE, BLOOD (ROUTINE X 2)
Culture: NO GROWTH
Culture: NO GROWTH
Special Requests: ADEQUATE

## 2019-01-08 ENCOUNTER — Other Ambulatory Visit: Payer: Self-pay

## 2019-01-08 NOTE — Patient Outreach (Signed)
[facility]) Care Management  01/08/2019  Leah Levine 03/31/1932 [id number]  Medication Adherence call to [name] Compliant Voice message left with a call back number. [name] is showing past due on Rosuvastatin 20 mg and Metformin 1000 mg under [name].   [place] Management Direct Dial [phone]  Fax [phone] [email or web address]

## 2019-01-26 ENCOUNTER — Other Ambulatory Visit: Payer: Self-pay

## 2019-01-26 NOTE — Patient Outreach (Signed)
[facility]) Care Management  01/26/2019  Leah Levine 1932/12/11 [id number]   Medication Adherence call to [name] Compliant Voice message left with a call back number,patient has a disconnected number but [employer] has a different number. [name] is showing past due on Rosuvastatin 20 mg and Metformin 1000 mg patient under [place].   [place] Management Direct Dial [phone]  Fax [phone] [name]

## 2019-08-13 ENCOUNTER — Other Ambulatory Visit: Payer: Self-pay

## 2019-08-13 ENCOUNTER — Other Ambulatory Visit (HOSPITAL_COMMUNITY): Payer: Self-pay | Admitting: Internal Medicine

## 2019-08-13 ENCOUNTER — Ambulatory Visit (HOSPITAL_COMMUNITY)
Admission: RE | Admit: 2019-08-13 | Discharge: 2019-08-13 | Disposition: A | Discharge status: Home / Self Care | Payer: Medicare Other | Source: Ambulatory Visit | Attending: Internal Medicine | Admitting: Internal Medicine

## 2019-08-13 DIAGNOSIS — R0600 Dyspnea, unspecified: Secondary | ICD-10-CM | POA: Diagnosis not present

## 2020-02-18 ENCOUNTER — Emergency Department (HOSPITAL_COMMUNITY)
Admission: EM | Admit: 2020-02-18 | Discharge: 2020-02-18 | Disposition: A | Discharge status: Home / Self Care | Payer: Medicare Other | Attending: Emergency Medicine | Admitting: Emergency Medicine

## 2020-02-18 ENCOUNTER — Other Ambulatory Visit: Payer: Self-pay

## 2020-02-18 ENCOUNTER — Encounter (HOSPITAL_COMMUNITY): Payer: Self-pay | Admitting: Emergency Medicine

## 2020-02-18 DIAGNOSIS — I2510 Atherosclerotic heart disease of native coronary artery without angina pectoris: Secondary | ICD-10-CM | POA: Diagnosis not present

## 2020-02-18 DIAGNOSIS — E039 Hypothyroidism, unspecified: Secondary | ICD-10-CM | POA: Diagnosis not present

## 2020-02-18 DIAGNOSIS — Z20822 Contact with and (suspected) exposure to covid-19: Secondary | ICD-10-CM | POA: Insufficient documentation

## 2020-02-18 DIAGNOSIS — Z951 Presence of aortocoronary bypass graft: Secondary | ICD-10-CM | POA: Diagnosis not present

## 2020-02-18 DIAGNOSIS — I129 Hypertensive chronic kidney disease with stage 1 through stage 4 chronic kidney disease, or unspecified chronic kidney disease: Secondary | ICD-10-CM | POA: Insufficient documentation

## 2020-02-18 DIAGNOSIS — R531 Weakness: Secondary | ICD-10-CM | POA: Diagnosis present

## 2020-02-18 DIAGNOSIS — N3001 Acute cystitis with hematuria: Secondary | ICD-10-CM | POA: Diagnosis not present

## 2020-02-18 DIAGNOSIS — N183 Chronic kidney disease, stage 3 unspecified: Secondary | ICD-10-CM | POA: Insufficient documentation

## 2020-02-18 DIAGNOSIS — Z7984 Long term (current) use of oral hypoglycemic drugs: Secondary | ICD-10-CM | POA: Insufficient documentation

## 2020-02-18 DIAGNOSIS — E785 Hyperlipidemia, unspecified: Secondary | ICD-10-CM | POA: Diagnosis not present

## 2020-02-18 DIAGNOSIS — Z86010 Personal history of colonic polyps: Secondary | ICD-10-CM | POA: Diagnosis not present

## 2020-02-18 DIAGNOSIS — E1169 Type 2 diabetes mellitus with other specified complication: Secondary | ICD-10-CM | POA: Diagnosis not present

## 2020-02-18 DIAGNOSIS — E1122 Type 2 diabetes mellitus with diabetic chronic kidney disease: Secondary | ICD-10-CM | POA: Insufficient documentation

## 2020-02-18 DIAGNOSIS — Z79899 Other long term (current) drug therapy: Secondary | ICD-10-CM | POA: Insufficient documentation

## 2020-02-18 LAB — CBC
HCT: 46.7 % — ABNORMAL HIGH (ref 36.0–46.0)
Hemoglobin: 14.7 g/dL (ref 12.0–15.0)
MCH: 26.3 pg (ref 26.0–34.0)
MCHC: 31.5 g/dL (ref 30.0–36.0)
MCV: 83.7 fL (ref 80.0–100.0)
Platelets: 244 10*3/uL (ref 150–400)
RBC: 5.58 MIL/uL — ABNORMAL HIGH (ref 3.87–5.11)
RDW: 14.7 % (ref 11.5–15.5)
WBC: 7.1 10*3/uL (ref 4.0–10.5)
nRBC: 0 % (ref 0.0–0.2)

## 2020-02-18 LAB — BASIC METABOLIC PANEL
Anion gap: 9 (ref 5–15)
BUN: 15 mg/dL (ref 8–23)
CO2: 27 mmol/L (ref 22–32)
Calcium: 9 mg/dL (ref 8.9–10.3)
Chloride: 99 mmol/L (ref 98–111)
Creatinine, Ser: 1.1 mg/dL — ABNORMAL HIGH (ref 0.44–1.00)
GFR, Estimated: 49 mL/min — ABNORMAL LOW (ref >60)
Glucose, Bld: 199 mg/dL — ABNORMAL HIGH (ref 70–99)
Potassium: 3.7 mmol/L (ref 3.5–5.1)
Sodium: 135 mmol/L (ref 135–145)

## 2020-02-18 LAB — URINALYSIS, ROUTINE W REFLEX MICROSCOPIC
Bilirubin Urine: NEGATIVE
Glucose, UA: 50 mg/dL — AB
Hgb urine dipstick: NEGATIVE
Ketones, ur: NEGATIVE mg/dL
Nitrite: POSITIVE — AB
Protein, ur: NEGATIVE mg/dL
Specific Gravity, Urine: 1.02 (ref 1.005–1.030)
WBC, UA: >50 WBC/hpf — ABNORMAL HIGH (ref 0–5)
pH: 5 (ref 5.0–8.0)

## 2020-02-18 LAB — RESP PANEL BY RT-PCR (FLU A&B, COVID) ARPGX2
Influenza A by PCR: NEGATIVE
Influenza B by PCR: NEGATIVE
SARS Coronavirus 2 by RT PCR: NEGATIVE

## 2020-02-18 LAB — CBG MONITORING, ED: Glucose-Capillary: 196 mg/dL — ABNORMAL HIGH (ref 70–99)

## 2020-02-18 LAB — TROPONIN I (HIGH SENSITIVITY)
Troponin I (High Sensitivity): 5 ng/L (ref <18)
Troponin I (High Sensitivity): 4 ng/L (ref <18)

## 2020-02-18 MED ORDER — NITROFURANTOIN MONOHYD MACRO 100 MG PO CAPS: 100.0000 mg | ORAL_CAPSULE | Freq: Two times a day (BID) | ORAL | 0 refills | Status: AC

## 2020-02-18 MED ORDER — NITROFURANTOIN MONOHYD MACRO 100 MG PO CAPS
100.0000 mg | ORAL_CAPSULE | Freq: Once | ORAL | Status: AC
  Administered 2020-02-18: 100 mg via ORAL
  Filled 2020-02-18: qty 1

## 2020-02-18 NOTE — ED Notes (Signed)
Pt given water in hopes that she could provide urine spec

## 2020-02-18 NOTE — Discharge Instructions (Signed)
You still have a urinary tract infection which is probably the reason for your symptoms today.  It is very important that she complete the entire course of these antibiotics even if you are feeling better.  Make sure you are drinking plenty of fluids to flush out this infection.  Plan to see your doctor for recheck after the antibiotics are completed to make sure the infection is gone.  Get rechecked immediately if you develop fevers, uncontrolled vomiting or any worsening weakness.

## 2020-02-18 NOTE — ED Provider Notes (Signed)
[facility] EMERGENCY DEPARTMENT Provider Note   CSN: [id number] Arrival date & time: 02/18/20  1716     History Chief Complaint  Patient presents with   Weakness    Leah Levine is a 86 y.o. female with a history of anemia, CAD with CABG, diabetes, GERD, hypertension, history of CVA with expressive aphasia presenting for evaluation of generalized weakness and poor appetite today.  Daughter at the bedside whom is her primary caregiver states that she woke around 4 PM and had complaints of generalized weakness and had a worsening of her general chronic tremor.  She has had no fevers or chills, she denies chest pain, shortness of breath, also denies abdominal pain.  She does have increased urinary frequency.  She was treated for a UTI the end of October with Keflex but only took half of the prescription as she said her symptoms resolved.  She denies dysuria and hematuria, but states the urinary frequency never resolved.  Of note, daughter states she usually does sleep during the day, this is not unusual and is generally up late at night.  HPI     Past Medical History:  Diagnosis Date   Allergy    Anemia    CAD (coronary artery disease) 11-2012   CABG x 4 utilizing LIMA to LAD, SVG to Diagonal, SVG to Left Circumflex, and SVG to RCA   Diabetes mellitus type 2, controlled ([place])    GERD (gastroesophageal reflux disease)    Hearing loss    Hyperlipidemia    Hypertension    patient denies ever having hypertension   Hypothyroidism    Personal history of colonic polyps 11/20/2010   tubular adenomas   Stroke [facility])    Tremor    Vision abnormalities     Patient Active Problem List   Diagnosis Date Noted   Acute lower UTI 08/30/2018   Hyponatremia 08/30/2018   Acute UTI 08/30/2018   Dizziness 11/08/2016   Gait abnormality 11/08/2016   Aphasia    TIA (transient ischemic attack) 06/20/2016   Chest pain 03/06/2016   Arm pain, anterior, left  03/06/2016   Orthostatic hypotension 02/21/2016   Late effect of cerebrovascular accident 11/10/2015   Foraminal stenosis of cervical region 11/10/2015   Frequent falls 11/10/2015   Hypertriglyceridemia 09/05/2015   Neck muscle spasm 08/26/2015   CKD stage 3 due to type 2 diabetes mellitus ([place]) 08/26/2015   Intertrigo 03/14/2015   Stroke (cerebrum) ([place]) 03/05/2015   Expressive aphasia 03/05/2015   Syncope 12/09/2013   Coronary artery disease 09/18/2013   Hypothyroidism 06/18/2013   S/P CABG x 4 12/01/2012   Vitamin B12 deficiency 12/05/2010   Personal history of colonic polyps 12/05/2010   Esophageal dysphagia 11/20/2010   Benign neoplasm of colon 11/20/2010   Gastritis, chronic 11/20/2010   Allergic rhinitis 07/03/2010   Tremor    GERD (gastroesophageal reflux disease)    Hyperlipidemia    Hypertension    Urinary incontinence    Type 2 diabetes mellitus with renal manifestations ([place])     Past Surgical History:  Procedure Laterality Date   ABDOMINAL HYSTERECTOMY     APPENDECTOMY     BACK SURGERY     CARPAL TUNNEL RELEASE     CHOLECYSTECTOMY     CORONARY ARTERY BYPASS GRAFT N/A 11/26/2012   Procedure: CORONARY ARTERY BYPASS GRAFTING (CABG);  Surgeon: Grace Isaac, MD;  Location: [place];  Service: Open Heart Surgery;  Laterality: N/A;   INTRAOPERATIVE TRANSESOPHAGEAL ECHOCARDIOGRAM N/A 11/26/2012  Procedure: INTRAOPERATIVE TRANSESOPHAGEAL ECHOCARDIOGRAM;  Surgeon: Grace Isaac, MD;  Location: [place];  Service: Open Heart Surgery;  Laterality: N/A;   KNEE ARTHROSCOPY     bilateral   LEFT HEART CATHETERIZATION WITH CORONARY ANGIOGRAM N/A 11/25/2012   Procedure: LEFT HEART CATHETERIZATION WITH CORONARY ANGIOGRAM;  Surgeon: Larey Dresser, MD;  Location: [facility] CATH LAB;  Service: Cardiovascular;  Laterality: N/A;   LOOP RECORDER IMPLANT N/A 12/10/2013   Procedure: LOOP RECORDER IMPLANT;  Surgeon: Coralyn Mark, MD;  Location: [place] CATH LAB;   Service: Cardiovascular;  Laterality: N/A;   NECK SURGERY       OB History   No obstetric history on file.     Family History  Problem Relation Age of Onset   Ovarian cancer Mother    Diabetes Father    Pneumonia Father    Diabetes Sister    Stroke Daughter    Heart attack Sister    Heart attack Brother     Social History   Tobacco Use   Smoking status: Never Smoker   Smokeless tobacco: Never Used  Scientific laboratory technician Use: Never used  Substance Use Topics   Alcohol use: No   Drug use: No    Home Medications Prior to Admission medications   Medication Sig Start Date End Date Taking? Authorizing Provider  levothyroxine (SYNTHROID, LEVOTHROID) 112 MCG tablet TAKE 1 TABLET ONCE DAILY BEFORE BREAKFAST Patient taking differently: Take 112 mcg by mouth daily before breakfast.  11/30/16  Yes Timmothy Euler, MD  metFORMIN (GLUCOPHAGE) 1000 MG tablet TAKE (1) TABLET TWICE A DAY WITH MEALS (BREAKFAST AND SUPPER) Patient taking differently: Take 1,000 mg by mouth 2 (two) times daily with a meal.  05/21/17  Yes Timmothy Euler, MD  propranolol (INDERAL) 20 MG tablet Take 20 mg by mouth 2 (two) times daily. 01/15/20  Yes [provider]  nitrofurantoin, macrocrystal-monohydrate, (MACROBID) 100 MG capsule Take 1 capsule (100 mg total) by mouth 2 (two) times daily. 02/18/20   Evalee Jefferson, PA-C  rosuvastatin (CRESTOR) 20 MG tablet TAKE 1 TABLET DAILY Patient taking differently: Take 20 mg by mouth daily.  05/21/17   Timmothy Euler, MD    Allergies    Celebrex [celecoxib], Fenofibrate, and Statins  Review of Systems   Review of Systems  Constitutional: Positive for fatigue. Negative for fever.  HENT: Negative for congestion and sore throat.   Eyes: Negative.   Respiratory: Negative for chest tightness and shortness of breath.   Cardiovascular: Negative for chest pain.  Gastrointestinal: Negative for abdominal pain, nausea and vomiting.   Genitourinary: Positive for frequency. Negative for dysuria and urgency.  Musculoskeletal: Negative for arthralgias, joint swelling and neck pain.  Skin: Negative.  Negative for rash and wound.  Neurological: Positive for tremors. Negative for dizziness, weakness, light-headedness, numbness and headaches.  Psychiatric/Behavioral: Negative.   All other systems reviewed and are negative.   Physical Exam Updated Vital Signs BP (!) 143/68    Pulse 67    Temp 98.1 F (36.7 C) (Oral)    Resp 18    Ht 5' (1.524 m)    Wt 74.8 kg    SpO2 95%    BMI 32.22 kg/m   Physical Exam Vitals and nursing note reviewed.  Constitutional:      Appearance: She is well-developed.  HENT:     Head: Normocephalic and atraumatic.  Eyes:     Conjunctiva/sclera: Conjunctivae normal.  Cardiovascular:     Rate and  Rhythm: Normal rate and regular rhythm.     Heart sounds: Normal heart sounds.  Pulmonary:     Effort: Pulmonary effort is normal.     Breath sounds: Normal breath sounds. No wheezing.  Abdominal:     General: Bowel sounds are normal.     Palpations: Abdomen is soft.     Tenderness: There is no abdominal tenderness.  Musculoskeletal:        General: Normal range of motion.     Cervical back: Normal range of motion.  Skin:    General: Skin is warm and dry.  Neurological:     Mental Status: She is alert.     ED Results / Procedures / Treatments   Labs (all labs ordered are listed, but only abnormal results are displayed) Labs Reviewed  BASIC METABOLIC PANEL - Abnormal; Notable for the following components:      Result Value   Glucose, Bld 199 (*)    Creatinine, Ser 1.10 (*)    GFR, Estimated 49 (*)    All other components within normal limits  CBC - Abnormal; Notable for the following components:   RBC 5.58 (*)    HCT 46.7 (*)    All other components within normal limits  URINALYSIS, ROUTINE W REFLEX MICROSCOPIC - Abnormal; Notable for the following components:   APPearance HAZY (*)     Glucose, UA 50 (*)    Nitrite POSITIVE (*)    Leukocytes,Ua LARGE (*)    WBC, UA >50 (*)    Bacteria, UA MANY (*)    All other components within normal limits  CBG MONITORING, ED - Abnormal; Notable for the following components:   Glucose-Capillary 196 (*)    All other components within normal limits  RESP PANEL BY RT-PCR (FLU A&B, COVID) ARPGX2  URINE CULTURE  TROPONIN I (HIGH SENSITIVITY)  TROPONIN I (HIGH SENSITIVITY)    EKG EKG Interpretation  Date/Time:  Thursday February 18 2020 17:28:07 EST Ventricular Rate:  68 PR Interval:    QRS Duration: 105 QT Interval:  460 QTC Calculation: 490 R Axis:   -22 Text Interpretation: Sinus rhythm Short PR interval Borderline left axis deviation Borderline T abnormalities, diffuse leads Borderline prolonged QT interval No STEMI Confirmed by Octaviano Glow [id number]) on 02/18/2020 7:25:26 PM   Radiology No results found.  Procedures Procedures (including critical care time)  Medications Ordered in ED Medications  nitrofurantoin (macrocrystal-monohydrate) (MACROBID) capsule 100 mg (has no administration in time range)    ED Course  I have reviewed the triage vital signs and the nursing notes.  Pertinent labs & imaging results that were available during my care of the patient were reviewed by me and considered in my medical decision making (see chart for details).    MDM Rules/Calculators/A&P                          Patient who appears generally well on exam, her vital signs are stable, she does have a somewhat elevated blood pressure but she has afebrile with a normal pulse rate.  She does have a UTI which certainly would explain her presenting symptoms.  No sign of urosepsis.  She had not completed a course of Keflex which she was prescribed on October 28 for UTI.  She was prescribed Macrobid after review of urine culture which was obtained in July.  She was strongly advised that she needs to complete the entire course of this  antibiotic and then  have a follow-up urinalysis completed by her primary doctor to ensure resolution of this infection.  Return precautions were outlined including fevers, vomiting, worsened weakness.  Patient was seen by Dr. Langston Masker during this visit.   final Clinical Impression(s) / ED Diagnoses Final diagnoses:  Acute cystitis with hematuria    Rx / DC Orders ED Discharge Orders         Ordered    nitrofurantoin, macrocrystal-monohydrate, (MACROBID) 100 MG capsule  2 times daily        02/18/20 2123           Evalee Jefferson, Hershal Coria 02/18/20 2129    [name], MD 02/19/20 1022

## 2020-02-18 NOTE — ED Triage Notes (Signed)
Pt from home   Hx CVA, expressive aphasia, anxiety  Complains of weakness, diminished appetite today  Anxiety  Per family caregiver in usual state of wellness  Here for eval

## 2020-02-18 NOTE — ED Notes (Signed)
Pt removes BP cuff due to discomfort when it blows up

## 2020-02-21 LAB — URINE CULTURE: Culture: 80000 — AB

## 2020-02-22 ENCOUNTER — Telehealth: Payer: Self-pay | Admitting: *Deleted

## 2020-02-22 NOTE — Telephone Encounter (Signed)
Post ED Visit - Positive Culture Follow-up  Culture report reviewed by antimicrobial stewardship pharmacist: [place] Team []  Elenor Quinones, Pharm.D. []  Heide Guile, Pharm.D., BCPS AQ-ID []  Parks Neptune, Pharm.D., BCPS []  Alycia Rossetti, Pharm.D., BCPS []  [place], Pharm.D., BCPS, AAHIVP []  Legrand Como, Pharm.D., BCPS, AAHIVP []  Salome Arnt, PharmD, BCPS []  Johnnette Gourd, PharmD, BCPS []  Hughes Better, PharmD, BCPS []  Leeroy Cha, PharmD []  Laqueta Linden, PharmD, BCPS []  Albertina Parr, PharmD  [place] Team []  Leodis Sias, PharmD []  Lindell Spar, PharmD []  Royetta Asal, PharmD []  Graylin Shiver, Rph []  Rema Fendt) Glennon Mac, PharmD []  Arlyn Dunning, PharmD []  Netta Cedars, PharmD []  Dia Sitter, PharmD []  Leone Haven, PharmD []  Gretta Arab, PharmD []  Theodis Shove, PharmD []  Peggyann Juba, PharmD []  Reuel Boom, PharmD   Positive urine culture Treated with Nitrofurnation Monohyd Macro, organism sensitive to the same and no further patient follow-up is required at this time. Laqueta Linden, PharmD  Harlon Flor Talley 02/22/2020, 5:32 PM

## 2020-07-14 ENCOUNTER — Emergency Department (HOSPITAL_COMMUNITY)
Admission: EM | Admit: 2020-07-14 | Discharge: 2020-07-14 | Disposition: A | Discharge status: Home / Self Care | Payer: Medicare Other | Attending: Emergency Medicine | Admitting: Emergency Medicine

## 2020-07-14 ENCOUNTER — Encounter (HOSPITAL_COMMUNITY): Payer: Self-pay

## 2020-07-14 ENCOUNTER — Emergency Department (HOSPITAL_COMMUNITY): Payer: Medicare Other

## 2020-07-14 ENCOUNTER — Other Ambulatory Visit: Payer: Self-pay

## 2020-07-14 DIAGNOSIS — Z79899 Other long term (current) drug therapy: Secondary | ICD-10-CM | POA: Diagnosis not present

## 2020-07-14 DIAGNOSIS — S0990XA Unspecified injury of head, initial encounter: Secondary | ICD-10-CM | POA: Diagnosis not present

## 2020-07-14 DIAGNOSIS — E1122 Type 2 diabetes mellitus with diabetic chronic kidney disease: Secondary | ICD-10-CM | POA: Insufficient documentation

## 2020-07-14 DIAGNOSIS — I1310 Hypertensive heart and chronic kidney disease without heart failure, with stage 1 through stage 4 chronic kidney disease, or unspecified chronic kidney disease: Secondary | ICD-10-CM | POA: Insufficient documentation

## 2020-07-14 DIAGNOSIS — M545 Low back pain, unspecified: Secondary | ICD-10-CM | POA: Diagnosis not present

## 2020-07-14 DIAGNOSIS — I2510 Atherosclerotic heart disease of native coronary artery without angina pectoris: Secondary | ICD-10-CM | POA: Diagnosis not present

## 2020-07-14 DIAGNOSIS — Z951 Presence of aortocoronary bypass graft: Secondary | ICD-10-CM | POA: Insufficient documentation

## 2020-07-14 DIAGNOSIS — E039 Hypothyroidism, unspecified: Secondary | ICD-10-CM | POA: Diagnosis not present

## 2020-07-14 DIAGNOSIS — M25552 Pain in left hip: Secondary | ICD-10-CM | POA: Diagnosis not present

## 2020-07-14 DIAGNOSIS — N183 Chronic kidney disease, stage 3 unspecified: Secondary | ICD-10-CM | POA: Diagnosis not present

## 2020-07-14 DIAGNOSIS — W19XXXA Unspecified fall, initial encounter: Secondary | ICD-10-CM

## 2020-07-14 DIAGNOSIS — Y92002 Bathroom of unspecified non-institutional (private) residence single-family (private) house as the place of occurrence of the external cause: Secondary | ICD-10-CM | POA: Diagnosis not present

## 2020-07-14 DIAGNOSIS — W228XXA Striking against or struck by other objects, initial encounter: Secondary | ICD-10-CM | POA: Insufficient documentation

## 2020-07-14 DIAGNOSIS — Z7984 Long term (current) use of oral hypoglycemic drugs: Secondary | ICD-10-CM | POA: Insufficient documentation

## 2020-07-14 MED ORDER — ACETAMINOPHEN 500 MG PO TABS
1000.0000 mg | ORAL_TABLET | Freq: Once | ORAL | Status: AC
  Administered 2020-07-14: 1000 mg via ORAL
  Filled 2020-07-14: qty 2

## 2020-07-14 NOTE — ED Provider Notes (Signed)
  Face-to-face evaluation   History: She presents for evaluation of fall, while she was getting ready to go to her doctor for routine visit.  Apparently she has a balance problem which tends to cause falls like this.  She is here with her daughter who helps give history.  Patient has not been ill recently.  There has been no fever, vomiting, dizziness or change in bowel or urinary habits.  At this time she complains of a headache, and an injury to her left third toe.  Physical exam: Alert elderly female she is somewhat overweight.  She is able to move his arms and legs equally.  She has a large contusion of the mid parietal area of her scalp.  There is no associated bleeding or laceration  Medical screening examination/treatment/procedure(s) were conducted as a shared visit with non-physician practitioner(s) and myself.  I personally evaluated the patient during the encounter    Daleen Bo, MD 07/15/20 [phone]

## 2020-07-14 NOTE — ED Triage Notes (Signed)
Pt was in bathroom bent over to pull up her pants and fell, headpain to right posterior head pain.  No LOC, pt remembers falling, pt alert and oriented, skin warm and dry.  Pain in head where she hit in tub

## 2020-07-14 NOTE — Discharge Instructions (Signed)
Your imaging is reassuring today.  Use Tylenol as needed for headache and pain.  If you have new or worsening symptoms return for reevaluation.

## 2020-07-14 NOTE — ED Notes (Signed)
Ambulated pt 2 steps in room.  Per family pt is at baseline.

## 2020-07-14 NOTE — ED Provider Notes (Signed)
[facility] EMERGENCY DEPARTMENT Provider Note   CSN: [id number] Arrival date & time: 07/14/20  1306     History Chief Complaint  Patient presents with  . Fall    Head Pain    Leah Levine is a 87 y.o. female.  Leah Levine is a 87 y.o. female with a history of hypertension, upper lipidemia, hypothyroid, GERD, CAD, diabetes, stroke, who presents to the emergency department via EMS for evaluation after a fall.  She reports she was in the bathroom bending over to pull up her pants when she lost her balance and fell over striking her head on the bathtub.  She is not sure what else she hit.  Did not lose consciousness and has full memory of the fall.  She is alert and oriented at baseline.  She describes headache.  Denies neck pain, does report some low back pain and pain in her left hip.  No chest or abdominal pain.  No other injuries from the fall.  She is not on blood thinners.  Lives at home with her daughter.  No other aggravating or alleviating factors.        Past Medical History:  Diagnosis Date  . Allergy   . Anemia   . CAD (coronary artery disease) 11-2012   CABG x 4 utilizing LIMA to LAD, SVG to Diagonal, SVG to Left Circumflex, and SVG to RCA  . Diabetes mellitus type 2, controlled ([place])   . GERD (gastroesophageal reflux disease)   . Hearing loss   . Hyperlipidemia   . Hypertension    patient denies ever having hypertension  . Hypothyroidism   . Personal history of colonic polyps 11/20/2010   tubular adenomas  . Stroke ([place])   . Tremor   . Vision abnormalities     Patient Active Problem List   Diagnosis Date Noted  . Acute lower UTI 08/30/2018  . Hyponatremia 08/30/2018  . Acute UTI 08/30/2018  . Dizziness 11/08/2016  . Gait abnormality 11/08/2016  . Aphasia   . TIA (transient ischemic attack) 06/20/2016  . Chest pain 03/06/2016  . Arm pain, anterior, left 03/06/2016  . Orthostatic hypotension 02/21/2016  . Late effect of  cerebrovascular accident 11/10/2015  . Foraminal stenosis of cervical region 11/10/2015  . Frequent falls 11/10/2015  . Hypertriglyceridemia 09/05/2015  . Neck muscle spasm 08/26/2015  . CKD stage 3 due to type 2 diabetes mellitus ([place]) 08/26/2015  . Intertrigo 03/14/2015  . Stroke (cerebrum) ([place]) 03/05/2015  . Expressive aphasia 03/05/2015  . Syncope 12/09/2013  . Coronary artery disease 09/18/2013  . Hypothyroidism 06/18/2013  . S/P CABG x 4 12/01/2012  . Vitamin B12 deficiency 12/05/2010  . Personal history of colonic polyps 12/05/2010  . Esophageal dysphagia 11/20/2010  . Benign neoplasm of colon 11/20/2010  . Gastritis, chronic 11/20/2010  . Allergic rhinitis 07/03/2010  . Tremor   . GERD (gastroesophageal reflux disease)   . Hyperlipidemia   . Hypertension   . Urinary incontinence   . Type 2 diabetes mellitus with renal manifestations [facility])     Past Surgical History:  Procedure Laterality Date  . ABDOMINAL HYSTERECTOMY    . APPENDECTOMY    . BACK SURGERY    . CARPAL TUNNEL RELEASE    . CHOLECYSTECTOMY    . CORONARY ARTERY BYPASS GRAFT N/A 11/26/2012   Procedure: CORONARY ARTERY BYPASS GRAFTING (CABG);  Surgeon: Grace Isaac, MD;  Location: [facility];  Service: Open Heart Surgery;  Laterality: N/A;  . INTRAOPERATIVE TRANSESOPHAGEAL  ECHOCARDIOGRAM N/A 11/26/2012   Procedure: INTRAOPERATIVE TRANSESOPHAGEAL ECHOCARDIOGRAM;  Surgeon: Grace Isaac, MD;  Location: [place];  Service: Open Heart Surgery;  Laterality: N/A;  . KNEE ARTHROSCOPY     bilateral  . LEFT HEART CATHETERIZATION WITH CORONARY ANGIOGRAM N/A 11/25/2012   Procedure: LEFT HEART CATHETERIZATION WITH CORONARY ANGIOGRAM;  Surgeon: Larey Dresser, MD;  Location: [facility] CATH LAB;  Service: Cardiovascular;  Laterality: N/A;  . LOOP RECORDER IMPLANT N/A 12/10/2013   Procedure: LOOP RECORDER IMPLANT;  Surgeon: Coralyn Mark, MD;  Location: [facility] CATH LAB;  Service: Cardiovascular;  Laterality: N/A;  . NECK SURGERY        OB History   No obstetric history on file.     Family History  Problem Relation Age of Onset  . Ovarian cancer Mother   . Diabetes Father   . Pneumonia Father   . Diabetes Sister   . Stroke Daughter   . Heart attack Sister   . Heart attack Brother     Social History   Tobacco Use  . Smoking status: Never Smoker  . Smokeless tobacco: Never Used  Vaping Use  . Vaping Use: Never used  Substance Use Topics  . Alcohol use: No  . Drug use: No    Home Medications Prior to Admission medications   Medication Sig Start Date End Date Taking? Authorizing Provider  levothyroxine (SYNTHROID, LEVOTHROID) 112 MCG tablet TAKE 1 TABLET ONCE DAILY BEFORE BREAKFAST Patient taking differently: Take 112 mcg by mouth daily before breakfast. 11/30/16  Yes Timmothy Euler, MD  metFORMIN (GLUCOPHAGE) 1000 MG tablet TAKE (1) TABLET TWICE A DAY WITH MEALS (BREAKFAST AND SUPPER) Patient taking differently: Take 1,000 mg by mouth 2 (two) times daily with a meal. 05/21/17  Yes Timmothy Euler, MD  mirtazapine (REMERON) 15 MG tablet Take 15 mg by mouth at bedtime. 07/01/20  Yes [provider]  propranolol (INDERAL) 20 MG tablet Take 20 mg by mouth 2 (two) times daily. 01/15/20  Yes [provider]  nitrofurantoin, macrocrystal-monohydrate, (MACROBID) 100 MG capsule Take 1 capsule (100 mg total) by mouth 2 (two) times daily. Patient not taking: Reported on 07/14/2020 02/18/20   Evalee Jefferson, PA-C  rosuvastatin (CRESTOR) 20 MG tablet TAKE 1 TABLET DAILY Patient not taking: Reported on 07/14/2020 05/21/17   Timmothy Euler, MD    Allergies    Celebrex [celecoxib], Fenofibrate, and Statins  Review of Systems   Review of Systems  Constitutional: Negative for chills and fever.  HENT: Negative.   Eyes: Negative for visual disturbance.  Respiratory: Negative for cough and shortness of breath.   Cardiovascular: Negative for chest pain.  Gastrointestinal: Negative for abdominal  pain, nausea and vomiting.  Musculoskeletal: Positive for arthralgias and back pain. Negative for myalgias and neck pain.  Skin: Negative for color change and rash.  Neurological: Positive for headaches. Negative for dizziness, syncope, weakness, light-headedness and numbness.  All other systems reviewed and are negative.   Physical Exam Updated Vital Signs BP (!) 150/94 (BP Location: Right Arm)   Pulse 80   Temp 98.4 F (36.9 C) (Oral)   Resp 16   Ht 5' (1.524 m)   Wt 74.8 kg   SpO2 99%   BMI 32.22 kg/m   Physical Exam Vitals and nursing note reviewed.  Constitutional:      General: She is not in acute distress.    Appearance: Normal appearance. She is well-developed. She is not diaphoretic.     Comments: Well-appearing  and in no distress   HENT:     Head: Normocephalic and atraumatic.     Comments: Mild tenderness over the crown of the head with no obvious signs of trauma, no hematoma, step-off or deformity, negative battle sign    Mouth/Throat:     Mouth: Mucous membranes are moist.     Pharynx: Oropharynx is clear.  Eyes:     General:        Right eye: No discharge.        Left eye: No discharge.     Extraocular Movements: Extraocular movements intact.     Pupils: Pupils are equal, round, and reactive to light.  Neck:     Comments: No midline tenderness Cardiovascular:     Rate and Rhythm: Normal rate and regular rhythm.     Heart sounds: Normal heart sounds. No murmur heard. No friction rub. No gallop.   Pulmonary:     Effort: Pulmonary effort is normal. No respiratory distress.     Breath sounds: Normal breath sounds. No wheezing or rales.     Comments: Respirations equal and unlabored, patient able to speak in full sentences, lungs clear to auscultation bilaterally, no ecchymosis or tenderness over the chest wall. Abdominal:     General: Bowel sounds are normal. There is no distension.     Palpations: Abdomen is soft. There is no mass.     Tenderness: There  is no abdominal tenderness. There is no guarding.     Comments: Abdomen soft, nondistended, nontender to palpation in all quadrants without guarding or peritoneal signs   Musculoskeletal:        General: Tenderness present. No deformity.     Cervical back: Neck supple. No tenderness.     Comments: There is some mild lumbar spine tenderness with no focal step-off or deformity, no midline thoracic spine tenderness. Mild tenderness over the left hip without deformity, shortening or internal rotation, able to lift off the bed without pain, no tenderness over the right hip. All other joints supple and easily movable, all compartments soft.  Skin:    General: Skin is warm and dry.     Capillary Refill: Capillary refill takes less than 2 seconds.  Neurological:     Mental Status: She is alert.     Coordination: Coordination normal.     Comments: Speech is clear, able to follow commands CN III-XII intact Normal strength in upper and lower extremities bilaterally including dorsiflexion and plantar flexion, strong and equal grip strength Sensation normal to light and sharp touch Moves extremities without ataxia, coordination intact  Psychiatric:        Mood and Affect: Mood normal.        Behavior: Behavior normal.     ED Results / Procedures / Treatments   Labs (all labs ordered are listed, but only abnormal results are displayed) Labs Reviewed - No data to display  EKG None  Radiology DG Lumbar Spine Complete  Result Date: 07/14/2020 CLINICAL DATA:  Fall with back pain EXAM: LUMBAR SPINE - COMPLETE 4+ VIEW COMPARISON:  04/20/2016 FINDINGS: Levoscoliosis. Aortic atherosclerosis. Post fusion changes at L4-L5 with intact appearing hardware. Vertebral body heights are grossly maintained. Moderate diffuse degenerative changes throughout the lumbar spine. Facet degenerative changes at multiple levels. IMPRESSION: 1. No definite acute osseous abnormality. 2. Post fusion changes at L4-L5.  Moderate diffuse degenerative changes. Electronically Signed   By: Donavan Foil M.D.   On: 07/14/2020 15:19   CT Head Wo Contrast  Result Date: 07/14/2020 CLINICAL DATA:  [place] forward and hit the bathtub. EXAM: CT HEAD WITHOUT CONTRAST TECHNIQUE: Contiguous axial images were obtained from the base of the skull through the vertex without intravenous contrast. COMPARISON:  08/30/2018 FINDINGS: Brain: Insert stable mild-to-moderate atrophy Mild-to-moderate patchy white matter low density in both cerebral hemispheres without significant change. No intracranial hemorrhage, mass lesion or CT evidence of acute infarction. Vascular: No hyperdense vessel or unexpected calcification. Skull: Normal. Negative for fracture or focal lesion. Sinuses/Orbits: Status post bilateral cataract extraction. Unremarkable bones and included paranasal sinuses. Other: Right concha bullosa. IMPRESSION: 1. No acute abnormality. 2. Stable atrophy and chronic small vessel white matter ischemic changes. Electronically Signed   By: Claudie Revering M.D.   On: 07/14/2020 15:10   CT Cervical Spine Wo Contrast  Result Date: 07/14/2020 CLINICAL DATA:  Neck trauma fall EXAM: CT CERVICAL SPINE WITHOUT CONTRAST TECHNIQUE: Multidetector CT imaging of the cervical spine was performed without intravenous contrast. Multiplanar CT image reconstructions were also generated. COMPARISON:  CT cervical spine 01/28/2016 FINDINGS: Alignment: Trace anterolisthesis C3 on C4, chronic. Facet alignment is maintained. Skull base and vertebrae: No acute fracture. No primary bone lesion or focal pathologic process. Incomplete fusion posterior arch of C1. Soft tissues and spinal canal: No prevertebral fluid or swelling. No visible canal hematoma. Disc levels: Multilevel degenerative changes. Moderate disc space narrowing and degenerative change C5-C6 and C6-C7. [place]clage wire fixation of the spinous processes at C6-C7. facet degenerative changes at multiple levels. Upper  chest: Negative. Other: None IMPRESSION: Multilevel degenerative changes of the cervical spine. No acute osseous abnormality. Electronically Signed   By: Donavan Foil M.D.   On: 07/14/2020 15:16   DG Hip Unilat W or Wo Pelvis 2-3 Views Left  Result Date: 07/14/2020 CLINICAL DATA:  Fall with left hip pain EXAM: DG HIP (WITH OR WITHOUT PELVIS) 2-3V LEFT COMPARISON:  04/08/2014 FINDINGS: Surgical hardware in the lower lumbar spine. The SI joints are non widened. The pubic symphysis and rami appear intact. Vascular calcifications. Both femoral heads project in joint. No acute displaced fracture or malalignment. There are mild degenerative changes. IMPRESSION: No acute osseous abnormality. Electronically Signed   By: Donavan Foil M.D.   On: 07/14/2020 15:17    Procedures Procedures   Medications Ordered in ED Medications  acetaminophen (TYLENOL) tablet 1,000 mg (1,000 mg Oral Given 07/14/20 1343)    ED Course  I have reviewed the triage vital signs and the nursing notes.  Pertinent labs & imaging results that were available during my care of the patient were reviewed by me and considered in my medical decision making (see chart for details).    MDM Rules/Calculators/A&P                         87 year old female presents to the ED for evaluation after mechanical fall when she was bending over and lost her balance tilting to one side and falling over, striking her head on the tub with no loss of consciousness but reported headache, also complaining of some back pain and left hip pain with palpation on exam.  No obvious trauma or deformity.  We will get CTs of the head and cervical spine and x-rays of the lumbar spine and left hip.  Chest and abdomen are nontender.  No instability over the pelvis.  I have personally reviewed imaging and agree with radiologist findings. CTs of the head and cervical spine with no acute traumatic injury.  Some  chronic degenerative changes and vascular ischemic  changes noted. X-rays of the hip with no acute osseous abnormality and x-rays of the lumbar spine again show chronic degenerative changes but no acute fracture.  Discussed reassuring imaging with patient and her daughter who is now at bedside.  They expressed understanding.  Her headache is improved significantly with Tylenol and she is feeling better.  She was able to stand up and bear weight on the hips in the ED without difficulty.  Walks with a walker at baseline.  Discussed symptomatic treatment and return precautions.  They expressed understanding and agreement.  Discharged home in good condition.  Final Clinical Impression(s) / ED Diagnoses Final diagnoses:  Fall, initial encounter  Injury of head, initial encounter    Rx / DC Orders ED Discharge Orders    None       Janet Berlin 07/14/20 1703    Daleen Bo, MD 07/15/20 [phone]

## 2020-07-14 NOTE — ED Notes (Signed)
Assisted pt with bedpan

## 2020-07-30 ENCOUNTER — Encounter (HOSPITAL_COMMUNITY): Payer: Self-pay | Admitting: Emergency Medicine

## 2020-07-30 ENCOUNTER — Emergency Department (HOSPITAL_COMMUNITY): Payer: Medicare Other

## 2020-07-30 ENCOUNTER — Other Ambulatory Visit: Payer: Self-pay

## 2020-07-30 ENCOUNTER — Emergency Department (HOSPITAL_COMMUNITY)
Admission: EM | Admit: 2020-07-30 | Discharge: 2020-07-30 | Disposition: A | Discharge status: Home / Self Care | Payer: Medicare Other | Attending: Emergency Medicine | Admitting: Emergency Medicine

## 2020-07-30 DIAGNOSIS — W06XXXA Fall from bed, initial encounter: Secondary | ICD-10-CM | POA: Diagnosis not present

## 2020-07-30 DIAGNOSIS — S0990XA Unspecified injury of head, initial encounter: Secondary | ICD-10-CM | POA: Diagnosis present

## 2020-07-30 DIAGNOSIS — Z951 Presence of aortocoronary bypass graft: Secondary | ICD-10-CM | POA: Diagnosis not present

## 2020-07-30 DIAGNOSIS — Z7984 Long term (current) use of oral hypoglycemic drugs: Secondary | ICD-10-CM | POA: Diagnosis not present

## 2020-07-30 DIAGNOSIS — Z79899 Other long term (current) drug therapy: Secondary | ICD-10-CM | POA: Diagnosis not present

## 2020-07-30 DIAGNOSIS — N183 Chronic kidney disease, stage 3 unspecified: Secondary | ICD-10-CM | POA: Insufficient documentation

## 2020-07-30 DIAGNOSIS — E039 Hypothyroidism, unspecified: Secondary | ICD-10-CM | POA: Insufficient documentation

## 2020-07-30 DIAGNOSIS — I2510 Atherosclerotic heart disease of native coronary artery without angina pectoris: Secondary | ICD-10-CM | POA: Insufficient documentation

## 2020-07-30 DIAGNOSIS — M25512 Pain in left shoulder: Secondary | ICD-10-CM | POA: Diagnosis not present

## 2020-07-30 DIAGNOSIS — E1122 Type 2 diabetes mellitus with diabetic chronic kidney disease: Secondary | ICD-10-CM | POA: Insufficient documentation

## 2020-07-30 DIAGNOSIS — I129 Hypertensive chronic kidney disease with stage 1 through stage 4 chronic kidney disease, or unspecified chronic kidney disease: Secondary | ICD-10-CM | POA: Diagnosis not present

## 2020-07-30 DIAGNOSIS — W19XXXA Unspecified fall, initial encounter: Secondary | ICD-10-CM

## 2020-07-30 DIAGNOSIS — S0083XA Contusion of other part of head, initial encounter: Secondary | ICD-10-CM | POA: Diagnosis not present

## 2020-07-30 DIAGNOSIS — S39012A Strain of muscle, fascia and tendon of lower back, initial encounter: Secondary | ICD-10-CM | POA: Insufficient documentation

## 2020-07-30 MED ORDER — HYDROCODONE-ACETAMINOPHEN 5-325 MG PO TABS
2.0000 | ORAL_TABLET | ORAL | 0 refills | Status: AC | PRN
Start: 2020-07-30 — End: ?

## 2020-07-30 MED ORDER — HYDROCODONE-ACETAMINOPHEN 5-325 MG PO TABS
1.0000 | ORAL_TABLET | Freq: Once | ORAL | Status: AC
  Administered 2020-07-30: 1 via ORAL
  Filled 2020-07-30: qty 1

## 2020-07-30 NOTE — Discharge Instructions (Addendum)
Work-up for the fall no evidence of any skull fracture or brain injury.  CT scan of brain and neck without any acute findings.  CT scan of upper back and lower back without any bony injuries.  Patient with pain in the back area.  Recommending taking the hydrocodone as needed for pain.  Make an appointment to follow-up with her doctor.  Prescription provided here.  Expect patient to be sore and stiff for at least 2 days

## 2020-07-30 NOTE — ED Provider Notes (Signed)
[facility] EMERGENCY DEPARTMENT Provider Note   CSN: [id number] Arrival date & time: 07/30/20  1032     History Chief Complaint  Patient presents with  . Fall    [address] Leah Levine is a 87 y.o. female.  Patient brought in by EMS.  Patient was getting out of bed and fell trying to stand up.  Normally she ambulates with a walker.  Patient states she landed on her buttocks but she clearly has abrasion and hematoma to the forehead area.  Patient also complaining of left shoulder pain.  And upper and lower back pain.  Patient was seen April 21 for a fall that occurred in the bathroom.  Patient denies any chest pain abdominal pain or headache.  Denies any leg pain.        Past Medical History:  Diagnosis Date  . Allergy   . Anemia   . CAD (coronary artery disease) 11-2012   CABG x 4 utilizing LIMA to LAD, SVG to Diagonal, SVG to Left Circumflex, and SVG to RCA  . Diabetes mellitus type 2, controlled ([place])   . GERD (gastroesophageal reflux disease)   . Hearing loss   . Hyperlipidemia   . Hypertension    patient denies ever having hypertension  . Hypothyroidism   . Personal history of colonic polyps 11/20/2010   tubular adenomas  . Stroke ([place])   . Tremor   . Vision abnormalities     Patient Active Problem List   Diagnosis Date Noted  . Acute lower UTI 08/30/2018  . Hyponatremia 08/30/2018  . Acute UTI 08/30/2018  . Dizziness 11/08/2016  . Gait abnormality 11/08/2016  . Aphasia   . TIA (transient ischemic attack) 06/20/2016  . Chest pain 03/06/2016  . Arm pain, anterior, left 03/06/2016  . Orthostatic hypotension 02/21/2016  . Late effect of cerebrovascular accident 11/10/2015  . Foraminal stenosis of cervical region 11/10/2015  . Frequent falls 11/10/2015  . Hypertriglyceridemia 09/05/2015  . Neck muscle spasm 08/26/2015  . CKD stage 3 due to type 2 diabetes mellitus ([place]) 08/26/2015  . Intertrigo 03/14/2015  . Stroke (cerebrum) ([place]) 03/05/2015  .  Expressive aphasia 03/05/2015  . Syncope 12/09/2013  . Coronary artery disease 09/18/2013  . Hypothyroidism 06/18/2013  . S/P CABG x 4 12/01/2012  . Vitamin B12 deficiency 12/05/2010  . Personal history of colonic polyps 12/05/2010  . Esophageal dysphagia 11/20/2010  . Benign neoplasm of colon 11/20/2010  . Gastritis, chronic 11/20/2010  . Allergic rhinitis 07/03/2010  . Tremor   . GERD (gastroesophageal reflux disease)   . Hyperlipidemia   . Hypertension   . Urinary incontinence   . Type 2 diabetes mellitus with renal manifestations [facility])     Past Surgical History:  Procedure Laterality Date  . ABDOMINAL HYSTERECTOMY    . APPENDECTOMY    . BACK SURGERY    . CARPAL TUNNEL RELEASE    . CHOLECYSTECTOMY    . CORONARY ARTERY BYPASS GRAFT N/A 11/26/2012   Procedure: CORONARY ARTERY BYPASS GRAFTING (CABG);  Surgeon: Grace Isaac, MD;  Location: [place];  Service: Open Heart Surgery;  Laterality: N/A;  . INTRAOPERATIVE TRANSESOPHAGEAL ECHOCARDIOGRAM N/A 11/26/2012   Procedure: INTRAOPERATIVE TRANSESOPHAGEAL ECHOCARDIOGRAM;  Surgeon: Grace Isaac, MD;  Location: [place];  Service: Open Heart Surgery;  Laterality: N/A;  . KNEE ARTHROSCOPY     bilateral  . LEFT HEART CATHETERIZATION WITH CORONARY ANGIOGRAM N/A 11/25/2012   Procedure: LEFT HEART CATHETERIZATION WITH CORONARY ANGIOGRAM;  Surgeon: Larey Dresser, MD;  Location: [place] CATH LAB;  Service: Cardiovascular;  Laterality: N/A;  . LOOP RECORDER IMPLANT N/A 12/10/2013   Procedure: LOOP RECORDER IMPLANT;  Surgeon: Coralyn Mark, MD;  Location: [place] CATH LAB;  Service: Cardiovascular;  Laterality: N/A;  . NECK SURGERY       OB History   No obstetric history on file.     Family History  Problem Relation Age of Onset  . Ovarian cancer Mother   . Diabetes Father   . Pneumonia Father   . Diabetes Sister   . Stroke Daughter   . Heart attack Sister   . Heart attack Brother     Social History   Tobacco Use  . Smoking status:  Never Smoker  . Smokeless tobacco: Never Used  Vaping Use  . Vaping Use: Never used  Substance Use Topics  . Alcohol use: No  . Drug use: No    Home Medications Prior to Admission medications   Medication Sig Start Date End Date Taking? Authorizing Provider  HYDROcodone-acetaminophen (NORCO/VICODIN) 5-325 MG tablet Take 2 tablets by mouth every 4 (four) hours as needed. 07/30/20  Yes Fredia Sorrow, MD  levothyroxine (SYNTHROID, LEVOTHROID) 112 MCG tablet TAKE 1 TABLET ONCE DAILY BEFORE BREAKFAST Patient taking differently: Take 112 mcg by mouth daily before breakfast. 11/30/16   Timmothy Euler, MD  metFORMIN (GLUCOPHAGE) 1000 MG tablet TAKE (1) TABLET TWICE A DAY WITH MEALS (BREAKFAST AND SUPPER) Patient taking differently: Take 1,000 mg by mouth 2 (two) times daily with a meal. 05/21/17   Timmothy Euler, MD  mirtazapine (REMERON) 15 MG tablet Take 15 mg by mouth at bedtime. 07/01/20   [provider]  nitrofurantoin, macrocrystal-monohydrate, (MACROBID) 100 MG capsule Take 1 capsule (100 mg total) by mouth 2 (two) times daily. Patient not taking: Reported on 07/14/2020 02/18/20   Evalee Jefferson, PA-C  propranolol (INDERAL) 20 MG tablet Take 20 mg by mouth 2 (two) times daily. 01/15/20   [provider]  rosuvastatin (CRESTOR) 20 MG tablet TAKE 1 TABLET DAILY Patient not taking: Reported on 07/14/2020 05/21/17   Timmothy Euler, MD    Allergies    Celebrex [celecoxib], Fenofibrate, and Statins  Review of Systems   Review of Systems  Constitutional: Negative for chills and fever.  HENT: Negative for rhinorrhea and sore throat.   Eyes: Negative for visual disturbance.  Respiratory: Negative for cough and shortness of breath.   Cardiovascular: Negative for chest pain and leg swelling.  Gastrointestinal: Negative for abdominal pain, diarrhea, nausea and vomiting.  Genitourinary: Negative for dysuria.  Musculoskeletal: Positive for back pain. Negative for neck pain.   Skin: Negative for rash.  Neurological: Negative for dizziness, light-headedness and headaches.  Hematological: Does not bruise/bleed easily.  Psychiatric/Behavioral: Negative for confusion.    Physical Exam Updated Vital Signs BP (!) 134/92   Pulse 73   Temp 98.6 F (37 C) (Oral)   Resp 16   Ht 1.524 m (5')   Wt 74.8 kg   SpO2 94%   BMI 32.22 kg/m   Physical Exam Vitals and nursing note reviewed.  Constitutional:      General: She is not in acute distress.    Appearance: She is well-developed.  HENT:     Head: Normocephalic.     Comments: Forehead with an abrasion measuring about 3 cm.  And a hematoma more to the right side of the forehead measuring probably about 8 cm.  No evidence of any nose or facial trauma. Eyes:  Extraocular Movements: Extraocular movements intact.     Conjunctiva/sclera: Conjunctivae normal.     Pupils: Pupils are equal, round, and reactive to light.  Cardiovascular:     Rate and Rhythm: Normal rate and regular rhythm.     Heart sounds: No murmur heard.   Pulmonary:     Effort: Pulmonary effort is normal. No respiratory distress.     Breath sounds: Normal breath sounds.  Abdominal:     Palpations: Abdomen is soft.     Tenderness: There is no abdominal tenderness.  Musculoskeletal:        General: Normal range of motion.     Cervical back: Neck supple.     Comments: Lower extremities with good range of motion particularly at the hips without any pain.  Good cap refill to the toes.  Sensation intact.  Upper extremity right upper extremity good range of motion shoulder elbow and wrist and fingers without any pain.  Left upper extremity with some discomfort at the shoulder area with range of motion.  But no obvious deformity.  Good radial pulse in both upper extremities.  Good cap refill to the fingers.  Sensations intact.  On the left upper extremity no pain of movement at the elbow wrist or fingers.  No tenderness to palpation of the lumbar  spine.  Patient subjectively says it hurts.  Skin:    General: Skin is warm and dry.     Capillary Refill: Capillary refill takes less than 2 seconds.  Neurological:     General: No focal deficit present.     Mental Status: She is alert and oriented to person, place, and time.     Cranial Nerves: No cranial nerve deficit.     Sensory: No sensory deficit.     Motor: No weakness.     ED Results / Procedures / Treatments   Labs (all labs ordered are listed, but only abnormal results are displayed) Labs Reviewed - No data to display  EKG None  Radiology CT Head Wo Contrast  Result Date: 07/30/2020 CLINICAL DATA:  Pt was getting out of bed and fell trying to stand up. Denies LOC. Pt c/o of bilateral arm pain, headache, and lower back pain. Family states she did not hit head but EMS found pt face down. Family stated pt rolled over face first after falling EXAM: CT HEAD WITHOUT CONTRAST CT CERVICAL SPINE WITHOUT CONTRAST TECHNIQUE: Multidetector CT imaging of the head and cervical spine was performed following the standard protocol without intravenous contrast. Multiplanar CT image reconstructions of the cervical spine were also generated. COMPARISON:  07/14/2020. FINDINGS: CT HEAD FINDINGS Brain: No evidence of acute infarction, hemorrhage, hydrocephalus, extra-axial collection or mass lesion/mass effect. Age appropriate ventricular sulcal enlargement. Mild white matter hypoattenuation consistent with chronic microvascular ischemic change. These findings are stable. Vascular: No hyperdense vessel or unexpected calcification. Skull: Normal. Negative for fracture or focal lesion. Sinuses/Orbits: Visualized globes and orbits are unremarkable. The visualized sinuses are clear. Other: None. CT CERVICAL SPINE FINDINGS Alignment: Slight anterolisthesis of C3 and C4, stable, degenerative in origin. No other spondylolisthesis. Skull base and vertebrae: No acute fracture. No primary bone lesion or focal  pathologic process. Soft tissues and spinal canal: No prevertebral fluid or swelling. No visible canal hematoma. Disc levels: Previous posterior fusion, C5 through C7. Loss of disc height at C5-C6 and C6-C7. Facet degenerative changes bilaterally, greatest in the upper cervical spine. These findings are stable. Upper chest: No acute or significant abnormalities. Other: None. IMPRESSION: HEAD  CT 1. No acute intracranial abnormalities. 2. No skull fracture. 3. No change from the prior study. CERVICAL CT 1. No fracture or acute finding. 2. No change from the prior study. Electronically Signed   By: Lajean Manes M.D.   On: 07/30/2020 12:15   CT Cervical Spine Wo Contrast  Result Date: 07/30/2020 CLINICAL DATA:  Pt was getting out of bed and fell trying to stand up. Denies LOC. Pt c/o of bilateral arm pain, headache, and lower back pain. Family states she did not hit head but EMS found pt face down. Family stated pt rolled over face first after falling EXAM: CT HEAD WITHOUT CONTRAST CT CERVICAL SPINE WITHOUT CONTRAST TECHNIQUE: Multidetector CT imaging of the head and cervical spine was performed following the standard protocol without intravenous contrast. Multiplanar CT image reconstructions of the cervical spine were also generated. COMPARISON:  07/14/2020. FINDINGS: CT HEAD FINDINGS Brain: No evidence of acute infarction, hemorrhage, hydrocephalus, extra-axial collection or mass lesion/mass effect. Age appropriate ventricular sulcal enlargement. Mild white matter hypoattenuation consistent with chronic microvascular ischemic change. These findings are stable. Vascular: No hyperdense vessel or unexpected calcification. Skull: Normal. Negative for fracture or focal lesion. Sinuses/Orbits: Visualized globes and orbits are unremarkable. The visualized sinuses are clear. Other: None. CT CERVICAL SPINE FINDINGS Alignment: Slight anterolisthesis of C3 and C4, stable, degenerative in origin. No other spondylolisthesis.  Skull base and vertebrae: No acute fracture. No primary bone lesion or focal pathologic process. Soft tissues and spinal canal: No prevertebral fluid or swelling. No visible canal hematoma. Disc levels: Previous posterior fusion, C5 through C7. Loss of disc height at C5-C6 and C6-C7. Facet degenerative changes bilaterally, greatest in the upper cervical spine. These findings are stable. Upper chest: No acute or significant abnormalities. Other: None. IMPRESSION: HEAD CT 1. No acute intracranial abnormalities. 2. No skull fracture. 3. No change from the prior study. CERVICAL CT 1. No fracture or acute finding. 2. No change from the prior study. Electronically Signed   By: Lajean Manes M.D.   On: 07/30/2020 12:15   CT Thoracic Spine Wo Contrast  Result Date: 07/30/2020 CLINICAL DATA:  Fall from bed. Bilateral arm pain, headache and low back pain. EXAM: CT THORACIC AND LUMBAR SPINE WITHOUT CONTRAST TECHNIQUE: Multidetector CT imaging of the thoracic and lumbar spine was performed without contrast. Multiplanar CT image reconstructions were also generated. COMPARISON:  Lumbar spine radiographs 07/14/2020, chest CTA 08/30/2018, thoracic spine CT 01/28/2016 and lumbar spine CT 09/02/2014. FINDINGS: CT THORACIC SPINE FINDINGS Alignment: Minimal convex right scoliosis. The lateral alignment is normal. Vertebrae: No evidence of acute fracture or traumatic subluxation. Multilevel paraspinal osteophytes. Paraspinal and other soft tissues: No acute paraspinal abnormalities. Atherosclerosis of the aorta, great vessels and coronary arteries status post CABG. Calcified granuloma in the lingula and calcified left hilar lymph nodes. Emphysematous changes are noted in the lungs. Disc levels: Multilevel spondylosis with paraspinal osteophytes. A partially calcified central disc protrusion at T6-7 appears unchanged. No acute large disc herniation or high-grade foraminal narrowing identified. CT LUMBAR SPINE FINDINGS Segmentation:  There are 5 lumbar type vertebral bodies. Alignment: Stable convex left lumbar scoliosis. Normal lateral alignment. Vertebrae: No evidence of acute lumbar spine fracture or traumatic subluxation. Status post laminectomy and PLIF at L4-5 with solid interbody fusion. No hardware loosening or displacement identified. Paraspinal and other soft tissues: No acute paraspinal abnormalities. Aortic and branch vessel atherosclerosis noted. Disc levels: L2-3: Chronic mildly progressive disc and endplate degeneration at L2-3 with paraspinal osteophytes asymmetric to the right.  Mild right foraminal narrowing. L3-4: Chronic disc and endplate degeneration with asymmetric osteophytes on the right. Mild spinal stenosis and mild right foraminal narrowing. L4-5: Status post laminectomy and PLIF. The spinal canal and neural foramina appear decompressed. L5-S1: The disc height appears maintained. There are chronic facet degenerative changes and endplate spurring asymmetric to the left. Asymmetric narrowing of the left lateral recess and left foramen appears chronic. IMPRESSION: 1. No acute osseous findings within the thoracolumbar spine. 2. Stable thoracolumbar alignment status post L4-5 PLIF. 3. Stable calcified central disc protrusion at T6-7 and chronic lumbar spondylosis as described. No high-grade spinal stenosis. 4. Aortic Atherosclerosis (ICD10-I70.0) and Emphysema (ICD10-J43.9). Electronically Signed   By: Richardean Sale M.D.   On: 07/30/2020 12:19   CT Lumbar Spine Wo Contrast  Result Date: 07/30/2020 CLINICAL DATA:  Fall from bed. Bilateral arm pain, headache and low back pain. EXAM: CT THORACIC AND LUMBAR SPINE WITHOUT CONTRAST TECHNIQUE: Multidetector CT imaging of the thoracic and lumbar spine was performed without contrast. Multiplanar CT image reconstructions were also generated. COMPARISON:  Lumbar spine radiographs 07/14/2020, chest CTA 08/30/2018, thoracic spine CT 01/28/2016 and lumbar spine CT 09/02/2014.  FINDINGS: CT THORACIC SPINE FINDINGS Alignment: Minimal convex right scoliosis. The lateral alignment is normal. Vertebrae: No evidence of acute fracture or traumatic subluxation. Multilevel paraspinal osteophytes. Paraspinal and other soft tissues: No acute paraspinal abnormalities. Atherosclerosis of the aorta, great vessels and coronary arteries status post CABG. Calcified granuloma in the lingula and calcified left hilar lymph nodes. Emphysematous changes are noted in the lungs. Disc levels: Multilevel spondylosis with paraspinal osteophytes. A partially calcified central disc protrusion at T6-7 appears unchanged. No acute large disc herniation or high-grade foraminal narrowing identified. CT LUMBAR SPINE FINDINGS Segmentation: There are 5 lumbar type vertebral bodies. Alignment: Stable convex left lumbar scoliosis. Normal lateral alignment. Vertebrae: No evidence of acute lumbar spine fracture or traumatic subluxation. Status post laminectomy and PLIF at L4-5 with solid interbody fusion. No hardware loosening or displacement identified. Paraspinal and other soft tissues: No acute paraspinal abnormalities. Aortic and branch vessel atherosclerosis noted. Disc levels: L2-3: Chronic mildly progressive disc and endplate degeneration at L2-3 with paraspinal osteophytes asymmetric to the right. Mild right foraminal narrowing. L3-4: Chronic disc and endplate degeneration with asymmetric osteophytes on the right. Mild spinal stenosis and mild right foraminal narrowing. L4-5: Status post laminectomy and PLIF. The spinal canal and neural foramina appear decompressed. L5-S1: The disc height appears maintained. There are chronic facet degenerative changes and endplate spurring asymmetric to the left. Asymmetric narrowing of the left lateral recess and left foramen appears chronic. IMPRESSION: 1. No acute osseous findings within the thoracolumbar spine. 2. Stable thoracolumbar alignment status post L4-5 PLIF. 3. Stable  calcified central disc protrusion at T6-7 and chronic lumbar spondylosis as described. No high-grade spinal stenosis. 4. Aortic Atherosclerosis (ICD10-I70.0) and Emphysema (ICD10-J43.9). Electronically Signed   By: Richardean Sale M.D.   On: 07/30/2020 12:19   DG Shoulder Left  Result Date: 07/30/2020 CLINICAL DATA:  Fall this morning with left shoulder pain. EXAM: LEFT SHOULDER - 2+ VIEW COMPARISON:  03/06/2016 FINDINGS: Mild degenerate change of the [facility] joint. No acute fracture or dislocation. Known calcified granuloma left mid lung. Remainder of the exam is unchanged. IMPRESSION: No acute findings. Electronically Signed   By: Marin Olp M.D.   On: 07/30/2020 12:19    Procedures Procedures   Medications Ordered in ED Medications  HYDROcodone-acetaminophen (NORCO/VICODIN) 5-325 MG per tablet 1 tablet (1 tablet Oral Given 07/30/20 1234)  ED Course  I have reviewed the triage vital signs and the nursing notes.  Pertinent labs & imaging results that were available during my care of the patient were reviewed by me and considered in my medical decision making (see chart for details).    MDM Rules/Calculators/A&P                          Work-up for the fall without any acute findings.  CT head and neck without any skull or brain injury and no cervical spine injury.  CT of thoracic and lumbar spine without any acute injuries.  X-ray of the left shoulder without any acute injuries.  Patient treated here with hydrocodone felt much better.  We will discharge her with a prepack back to home and follow-up with her doctors.     Final Clinical Impression(s) / ED Diagnoses Final diagnoses:  Fall, initial encounter  Injury of head, initial encounter  Strain of lumbar region, initial encounter    Rx / DC Orders ED Discharge Orders         Ordered    HYDROcodone-acetaminophen (NORCO/VICODIN) 5-325 MG tablet  Every 4 hours PRN        07/30/20 1353           Fredia Sorrow,  MD 07/30/20 1407

## 2020-07-30 NOTE — ED Triage Notes (Signed)
Pt was getting out of bed and fell trying to stand up. Denies LOC. Pt c/o of bilateral arm pain, headache, and lower back pain. Family states she did not hit head but EMS found pt face down. Family stated pt rolled over face first after falling

## 2020-09-03 ENCOUNTER — Emergency Department (HOSPITAL_COMMUNITY): Payer: Medicare Other

## 2020-09-03 ENCOUNTER — Emergency Department (HOSPITAL_COMMUNITY)
Admission: EM | Admit: 2020-09-03 | Discharge: 2020-09-03 | Disposition: A | Discharge status: Home / Self Care | Payer: Medicare Other | Attending: Emergency Medicine | Admitting: Emergency Medicine

## 2020-09-03 ENCOUNTER — Encounter (HOSPITAL_COMMUNITY): Payer: Self-pay

## 2020-09-03 ENCOUNTER — Other Ambulatory Visit: Payer: Self-pay

## 2020-09-03 DIAGNOSIS — E039 Hypothyroidism, unspecified: Secondary | ICD-10-CM | POA: Insufficient documentation

## 2020-09-03 DIAGNOSIS — M549 Dorsalgia, unspecified: Secondary | ICD-10-CM

## 2020-09-03 DIAGNOSIS — E1122 Type 2 diabetes mellitus with diabetic chronic kidney disease: Secondary | ICD-10-CM | POA: Diagnosis not present

## 2020-09-03 DIAGNOSIS — I129 Hypertensive chronic kidney disease with stage 1 through stage 4 chronic kidney disease, or unspecified chronic kidney disease: Secondary | ICD-10-CM | POA: Insufficient documentation

## 2020-09-03 DIAGNOSIS — Z951 Presence of aortocoronary bypass graft: Secondary | ICD-10-CM | POA: Insufficient documentation

## 2020-09-03 DIAGNOSIS — Z7984 Long term (current) use of oral hypoglycemic drugs: Secondary | ICD-10-CM | POA: Diagnosis not present

## 2020-09-03 DIAGNOSIS — E1129 Type 2 diabetes mellitus with other diabetic kidney complication: Secondary | ICD-10-CM | POA: Diagnosis not present

## 2020-09-03 DIAGNOSIS — Z79899 Other long term (current) drug therapy: Secondary | ICD-10-CM | POA: Insufficient documentation

## 2020-09-03 DIAGNOSIS — N6320 Unspecified lump in the left breast, unspecified quadrant: Secondary | ICD-10-CM | POA: Diagnosis not present

## 2020-09-03 DIAGNOSIS — K5900 Constipation, unspecified: Secondary | ICD-10-CM | POA: Diagnosis not present

## 2020-09-03 DIAGNOSIS — N183 Chronic kidney disease, stage 3 unspecified: Secondary | ICD-10-CM | POA: Diagnosis not present

## 2020-09-03 DIAGNOSIS — M545 Low back pain, unspecified: Secondary | ICD-10-CM | POA: Diagnosis present

## 2020-09-03 DIAGNOSIS — I2510 Atherosclerotic heart disease of native coronary artery without angina pectoris: Secondary | ICD-10-CM | POA: Diagnosis not present

## 2020-09-03 DIAGNOSIS — M25559 Pain in unspecified hip: Secondary | ICD-10-CM

## 2020-09-03 DIAGNOSIS — M25551 Pain in right hip: Secondary | ICD-10-CM | POA: Diagnosis not present

## 2020-09-03 LAB — CBC WITH DIFFERENTIAL/PLATELET
Abs Immature Granulocytes: 0.04 10*3/uL (ref 0.00–0.07)
Basophils Absolute: 0 10*3/uL (ref 0.0–0.1)
Basophils Relative: 1 %
Eosinophils Absolute: 0.1 10*3/uL (ref 0.0–0.5)
Eosinophils Relative: 2 %
HCT: 42.2 % (ref 36.0–46.0)
Hemoglobin: 13.4 g/dL (ref 12.0–15.0)
Immature Granulocytes: 1 %
Lymphocytes Relative: 31 %
Lymphs Abs: 2.1 10*3/uL (ref 0.7–4.0)
MCH: 26.8 pg (ref 26.0–34.0)
MCHC: 31.8 g/dL (ref 30.0–36.0)
MCV: 84.4 fL (ref 80.0–100.0)
Monocytes Absolute: 0.5 10*3/uL (ref 0.1–1.0)
Monocytes Relative: 7 %
Neutro Abs: 4 10*3/uL (ref 1.7–7.7)
Neutrophils Relative %: 58 %
Platelets: 223 10*3/uL (ref 150–400)
RBC: 5 MIL/uL (ref 3.87–5.11)
RDW: 15.4 % (ref 11.5–15.5)
WBC: 6.9 10*3/uL (ref 4.0–10.5)
nRBC: 0 % (ref 0.0–0.2)

## 2020-09-03 LAB — COMPREHENSIVE METABOLIC PANEL
ALT: 9 U/L (ref 0–44)
AST: 15 U/L (ref 15–41)
Albumin: 3.3 g/dL — ABNORMAL LOW (ref 3.5–5.0)
Alkaline Phosphatase: 63 U/L (ref 38–126)
Anion gap: 6 (ref 5–15)
BUN: 18 mg/dL (ref 8–23)
CO2: 27 mmol/L (ref 22–32)
Calcium: 9 mg/dL (ref 8.9–10.3)
Chloride: 105 mmol/L (ref 98–111)
Creatinine, Ser: 0.94 mg/dL (ref 0.44–1.00)
GFR, Estimated: 59 mL/min — ABNORMAL LOW (ref >60)
Glucose, Bld: 190 mg/dL — ABNORMAL HIGH (ref 70–99)
Potassium: 4.1 mmol/L (ref 3.5–5.1)
Sodium: 138 mmol/L (ref 135–145)
Total Bilirubin: 0.7 mg/dL (ref 0.3–1.2)
Total Protein: 7.1 g/dL (ref 6.5–8.1)

## 2020-09-03 LAB — URINALYSIS, ROUTINE W REFLEX MICROSCOPIC
Bilirubin Urine: NEGATIVE
Glucose, UA: 50 mg/dL — AB
Hgb urine dipstick: NEGATIVE
Ketones, ur: NEGATIVE mg/dL
Leukocytes,Ua: NEGATIVE
Nitrite: NEGATIVE
Protein, ur: NEGATIVE mg/dL
Specific Gravity, Urine: 1.018 (ref 1.005–1.030)
pH: 5 (ref 5.0–8.0)

## 2020-09-03 MED ORDER — LIDOCAINE 5 % EX PTCH: 1.0000 | MEDICATED_PATCH | CUTANEOUS | 0 refills | Status: AC

## 2020-09-03 MED ORDER — MORPHINE SULFATE (PF) 2 MG/ML IV SOLN
2.0000 mg | Freq: Once | INTRAVENOUS | Status: AC
  Administered 2020-09-03: 13:00:00 2 mg via INTRAVENOUS
  Filled 2020-09-03: qty 1

## 2020-09-03 NOTE — ED Triage Notes (Signed)
Patient to EMS from home with complaints of back pain for the past several weeks. States that pain is mostly on right side. No BM x one week. States that she has been seen for pain with no answer.

## 2020-09-03 NOTE — ED Notes (Signed)
Bladder scanned with result of 172ml.

## 2020-09-03 NOTE — Discharge Instructions (Addendum)
Your labwork and imaging were all very reassuring. There were no signs of hip/back fractures or other abnormalities. The CT scan of the abdomen did not show any signs of severe constipation however mild constipation could be contributing to the pain.   You can take Miralax up to 4x daily as needed until you have a good bowel movement. Then switch to once daily.  Increase the amount of water you drink to help soften your stools naturally.   Please pick up lidocaine patches to apply to the back/hip as needed for pain. You can also take Tylenol as needed for pain. Narcotic medication is not indicated at this time and will only worsen constipation.   Please follow up with your PCP next week for further evaluation of your symptoms.   You should also follow up with [facility] for a mammogram/ultrasound of your left breast as a breast mass was picked up on the CT scan incidentally.   Return to the ED for any new/worsening symptoms

## 2020-09-03 NOTE — ED Provider Notes (Signed)
[facility] EMERGENCY DEPARTMENT Provider Note   CSN: [id number] Arrival date & time: 09/03/20  1027     History Chief Complaint  Patient presents with   Back Pain   LEVEL 5 CAVEAT - EXPRESSIVE APHASIA S/2 STROKE  Leah Levine is a 87 y.o. female with Pmhx HTN, HLD, GERD, Diabetes, CAD s/p CABG, Stroke, and experssive aphasia who presents to the ED today via EMS for right lower back pain x several weeks. Per chart review pt was seen in the ED on 05/07 for a fall with negative CT Head, C Spine, T Spine, and L spine and discharged home with short course of pain medication. Pt reports she continues to have pain. Difficult to assess what she is taking for pain currently. Difficult to assess if she has followed up with her PCP for same. History significantly limited at this time. Unable to reach family for additional information. Per EMS pt has also not had a BM in 1 week. Pt denies abdominal pain.   The history is provided by the patient, the EMS personnel and medical records. History limited by: Expressive aphasia.      Past Medical History:  Diagnosis Date   Allergy    Anemia    CAD (coronary artery disease) 11-2012   CABG x 4 utilizing LIMA to LAD, SVG to Diagonal, SVG to Left Circumflex, and SVG to RCA   Diabetes mellitus type 2, controlled ([place])    GERD (gastroesophageal reflux disease)    Hearing loss    Hyperlipidemia    Hypertension    patient denies ever having hypertension   Hypothyroidism    Personal history of colonic polyps 11/20/2010   tubular adenomas   Stroke [facility])    Tremor    Vision abnormalities     Patient Active Problem List   Diagnosis Date Noted   Acute lower UTI 08/30/2018   Hyponatremia 08/30/2018   Acute UTI 08/30/2018   Dizziness 11/08/2016   Gait abnormality 11/08/2016   Aphasia    TIA (transient ischemic attack) 06/20/2016   Chest pain 03/06/2016   Arm pain, anterior, left 03/06/2016   Orthostatic hypotension 02/21/2016   Late  effect of cerebrovascular accident 11/10/2015   Foraminal stenosis of cervical region 11/10/2015   Frequent falls 11/10/2015   Hypertriglyceridemia 09/05/2015   Neck muscle spasm 08/26/2015   CKD stage 3 due to type 2 diabetes mellitus ([place]) 08/26/2015   Intertrigo 03/14/2015   Stroke (cerebrum) ([place]) 03/05/2015   Expressive aphasia 03/05/2015   Syncope 12/09/2013   Coronary artery disease 09/18/2013   Hypothyroidism 06/18/2013   S/P CABG x 4 12/01/2012   Vitamin B12 deficiency 12/05/2010   Personal history of colonic polyps 12/05/2010   Esophageal dysphagia 11/20/2010   Benign neoplasm of colon 11/20/2010   Gastritis, chronic 11/20/2010   Allergic rhinitis 07/03/2010   Tremor    GERD (gastroesophageal reflux disease)    Hyperlipidemia    Hypertension    Urinary incontinence    Type 2 diabetes mellitus with renal manifestations ([place])     Past Surgical History:  Procedure Laterality Date   ABDOMINAL HYSTERECTOMY     APPENDECTOMY     BACK SURGERY     CARPAL TUNNEL RELEASE     CHOLECYSTECTOMY     CORONARY ARTERY BYPASS GRAFT N/A 11/26/2012   Procedure: CORONARY ARTERY BYPASS GRAFTING (CABG);  Surgeon: Grace Isaac, MD;  Location: [place];  Service: Open Heart Surgery;  Laterality: N/A;   INTRAOPERATIVE TRANSESOPHAGEAL ECHOCARDIOGRAM N/A  11/26/2012   Procedure: INTRAOPERATIVE TRANSESOPHAGEAL ECHOCARDIOGRAM;  Surgeon: Grace Isaac, MD;  Location: [place];  Service: Open Heart Surgery;  Laterality: N/A;   KNEE ARTHROSCOPY     bilateral   LEFT HEART CATHETERIZATION WITH CORONARY ANGIOGRAM N/A 11/25/2012   Procedure: LEFT HEART CATHETERIZATION WITH CORONARY ANGIOGRAM;  Surgeon: Larey Dresser, MD;  Location: [facility] CATH LAB;  Service: Cardiovascular;  Laterality: N/A;   LOOP RECORDER IMPLANT N/A 12/10/2013   Procedure: LOOP RECORDER IMPLANT;  Surgeon: Coralyn Mark, MD;  Location: [facility] CATH LAB;  Service: Cardiovascular;  Laterality: N/A;   NECK SURGERY       OB History   No  obstetric history on file.     Family History  Problem Relation Age of Onset   Ovarian cancer Mother    Diabetes Father    Pneumonia Father    Diabetes Sister    Stroke Daughter    Heart attack Sister    Heart attack Brother     Social History   Tobacco Use   Smoking status: Never   Smokeless tobacco: Never  Vaping Use   Vaping Use: Never used  Substance Use Topics   Alcohol use: No   Drug use: No    Home Medications Prior to Admission medications   Medication Sig Start Date End Date Taking? Authorizing Provider  lidocaine (LIDODERM) 5 % Place 1 patch onto the skin daily. Remove & Discard patch within 12 hours or as directed by MD 09/03/20  Yes Alroy Bailiff, Heinrich Fertig, PA-C  HYDROcodone-acetaminophen (NORCO/VICODIN) 5-325 MG tablet Take 2 tablets by mouth every 4 (four) hours as needed. 07/30/20   Fredia Sorrow, MD  levothyroxine (SYNTHROID, LEVOTHROID) 112 MCG tablet TAKE 1 TABLET ONCE DAILY BEFORE BREAKFAST Patient taking differently: Take 112 mcg by mouth daily before breakfast. 11/30/16   Timmothy Euler, MD  metFORMIN (GLUCOPHAGE) 1000 MG tablet TAKE (1) TABLET TWICE A DAY WITH MEALS (BREAKFAST AND SUPPER) Patient taking differently: Take 1,000 mg by mouth 2 (two) times daily with a meal. 05/21/17   Timmothy Euler, MD  mirtazapine (REMERON) 15 MG tablet Take 15 mg by mouth at bedtime. 07/01/20   [provider]  nitrofurantoin, macrocrystal-monohydrate, (MACROBID) 100 MG capsule Take 1 capsule (100 mg total) by mouth 2 (two) times daily. Patient not taking: Reported on 07/14/2020 02/18/20   Evalee Jefferson, PA-C  propranolol (INDERAL) 20 MG tablet Take 20 mg by mouth 2 (two) times daily. 01/15/20   [provider]  rosuvastatin (CRESTOR) 20 MG tablet TAKE 1 TABLET DAILY Patient not taking: Reported on 07/14/2020 05/21/17   Timmothy Euler, MD    Allergies    Celebrex [celecoxib], Fenofibrate, and Statins  Review of Systems   Review of Systems  Unable to  perform ROS: Other  Gastrointestinal:  Positive for constipation.  Musculoskeletal:  Positive for back pain.   Physical Exam Updated Vital Signs BP (!) 166/92 (BP Location: Right Arm)   Pulse 80   Temp 98.4 F (36.9 C) (Oral)   Resp 18   Ht 5' (1.524 m)   Wt 74.8 kg   SpO2 95%   BMI 32.22 kg/m   Physical Exam Vitals and nursing note reviewed.  Constitutional:      Appearance: She is not ill-appearing.  HENT:     Head: Normocephalic and atraumatic.  Eyes:     Conjunctiva/sclera: Conjunctivae normal.  Cardiovascular:     Rate and Rhythm: Normal rate and regular rhythm.     Pulses:  Normal pulses.  Pulmonary:     Effort: Pulmonary effort is normal.     Breath sounds: Normal breath sounds. No wheezing, rhonchi or rales.  Abdominal:     General: Bowel sounds are normal.     Palpations: Abdomen is soft.     Tenderness: There is no abdominal tenderness. There is no guarding or rebound.     Comments: Soft, NTND, +BS throughout, no r/g/r, neg murphy's, neg mcburney's, no CVA TTP  Musculoskeletal:     Cervical back: Neck supple.     Comments: Rolled with nursing staff. No stepoffs or deformities. + midline lumbar and right paralumbar musculature TTP. + TTP to right hip as well. ROM to hip illicits pain. No leg shortening or external rotation. Unable to lift legs off of stretcher bilaterally. 2+ DP pulses bilaterally.   Skin:    General: Skin is warm and dry.  Neurological:     Mental Status: She is alert.    ED Results / Procedures / Treatments   Labs (all labs ordered are listed, but only abnormal results are displayed) Labs Reviewed  URINALYSIS, ROUTINE W REFLEX MICROSCOPIC - Abnormal; Notable for the following components:      Result Value   Glucose, UA 50 (*)    All other components within normal limits  COMPREHENSIVE METABOLIC PANEL - Abnormal; Notable for the following components:   Glucose, Bld 190 (*)    Albumin 3.3 (*)    GFR, Estimated 59 (*)    All other  components within normal limits  CBC WITH DIFFERENTIAL/PLATELET    EKG None  Radiology CT Abdomen Pelvis Wo Contrast  Result Date: 09/03/2020 CLINICAL DATA:  Back pain for several weeks, mainly on the right side. Nonlocalized abdomen and pelvic pain. EXAM: CT ABDOMEN AND PELVIS WITHOUT CONTRAST TECHNIQUE: Multidetector CT imaging of the abdomen and pelvis was performed following the standard protocol without IV contrast. COMPARISON:  Thoracic and lumbar spine CT dated 07/30/2020. FINDINGS: Lower chest: Atheromatous calcifications, including the coronary arteries and aorta. Mildly enlarged left atrium and left ventricle. Clear lung bases. Hepatobiliary: No focal liver abnormality is seen. Status post cholecystectomy. No biliary dilatation. Pancreas: Unremarkable. No pancreatic ductal dilatation or surrounding inflammatory changes. Spleen: Multiple calcified splenic granulomata. Normal sized spleen. Adrenals/Urinary Tract: Adrenal glands are unremarkable. Kidneys are normal, without renal calculi, focal lesion, or hydronephrosis. Bladder is unremarkable. Stomach/Bowel: Unremarkable stomach, small bowel and colon. No evidence of appendicitis. Vascular/Lymphatic: Atheromatous arterial calcifications without aneurysm. No enlarged lymph nodes. Reproductive: Status post hysterectomy. No adnexal masses. Other: Possible partially included mass in the upper left breast on the 1st image. Tiny umbilical hernia containing fat. Musculoskeletal: Extensive lumbar and lower thoracic spine degenerative changes. Interbody and screw and rod fixation at the L4-5 level. IMPRESSION: 1. No acute abnormality. 2. Possible partially included upper left breast mass. Recommend further evaluation with an outpatient bilateral diagnostic mammogram and left breast ultrasound. 3.  Calcific coronary artery and aortic atherosclerosis. Electronically Signed   By: Claudie Revering M.D.   On: 09/03/2020 14:11   DG Abd 1 View  Result Date:  09/03/2020 CLINICAL DATA:  Right hip pain and constipation. EXAM: ABDOMEN - 1 VIEW COMPARISON:  07/14/2020 FINDINGS: Scattered air and stool in the colon and down into the rectum but no significant stool burden. Scattered air-filled loops of small bowel but no distension to suggest obstruction. The soft tissue shadows of the abdomen are maintained. No evidence of free air. Surgical clips in the right upper quadrant from  a probable previous cholecystectomy. Lumbar fusion hardware noted. Stable aortic calcifications. Both hips are normally located. Stable degenerative changes. No definite pelvic fractures. IMPRESSION: No plain film findings for an acute abdominal process. Electronically Signed   By: Marijo Sanes M.D.   On: 09/03/2020 12:11   CT Hip Right Wo Contrast  Result Date: 09/03/2020 CLINICAL DATA:  Right hip pain.  Fell 3 months ago. EXAM: CT OF THE RIGHT HIP WITHOUT CONTRAST TECHNIQUE: Multidetector CT imaging of the right hip was performed according to the standard protocol. Multiplanar CT image reconstructions were also generated. COMPARISON:  Right hip x-rays from same day. FINDINGS: Bones/Joint/Cartilage No fracture or dislocation. Mild right hip joint space narrowing with small marginal osteophytes. No joint effusion. Ligaments Ligaments are suboptimally evaluated by CT. Muscles and Tendons Grossly intact. Soft tissue No fluid collection or hematoma.  No soft tissue mass. IMPRESSION: 1. No acute osseous abnormality. 2. Mild right hip osteoarthritis. Electronically Signed   By: Titus Dubin M.D.   On: 09/03/2020 14:02   CT L-SPINE NO CHARGE  Result Date: 09/03/2020 CLINICAL DATA:  Low back pain, primarily on the right. EXAM: CT LUMBAR SPINE WITHOUT CONTRAST TECHNIQUE: Multidetector CT imaging of the lumbar spine was performed without intravenous contrast administration. Multiplanar CT image reconstructions were also generated. COMPARISON:  Abdomen and pelvis CT obtained today. Lumbar spine CT  dated 07/30/2020. FINDINGS: Segmentation: 5 non-rib-bearing lumbar vertebrae. Alignment: Stable levoconvex lumbar scoliosis.  No subluxations. Vertebrae: No acute fracture or focal pathologic process. Paraspinal and other soft tissues: Unremarkable. Disc levels: Stable solid interbody and pedicle screw and rod fusion at the L4-5 level associated laminectomy defects. Stable previously described multilevel degenerative changes. No fractures, pars defects or subluxations. IMPRESSION: 1. No acute abnormality. 2. Stable postsurgical and degenerative changes. Electronically Signed   By: Claudie Revering M.D.   On: 09/03/2020 14:15   DG Hip Unilat With Pelvis 2-3 Views Right  Result Date: 09/03/2020 CLINICAL DATA:  Right hip pain after falling 3 months previously. EXAM: DG HIP (WITH OR WITHOUT PELVIS) 2-3V RIGHT COMPARISON:  None. FINDINGS: No acute fracture or malalignment. Mild degenerative osteoarthritis with relatively preserved joint space. Two surgical clips are present in the right inguinal region. Atherosclerotic calcifications noted in the aorta and femoral vessels. Incompletely imaged prior surgical changes of L4-L5 posterior lumbar interbody fusion. IMPRESSION: 1. No evidence of fracture or osseous abnormality. 2. Mild osteoarthritic degenerative changes. 3. Aortic and lower extremity peripheral arterial calcifications. Electronically Signed   By: Jacqulynn Cadet M.D.   On: 09/03/2020 12:08    Procedures Procedures   Medications Ordered in ED Medications  morphine 2 MG/ML injection 2 mg (2 mg Intravenous Given 09/03/20 1248)    ED Course  I have reviewed the triage vital signs and the nursing notes.  Pertinent labs & imaging results that were available during my care of the patient were reviewed by me and considered in my medical decision making (see chart for details).  Clinical Course as of 09/03/20 1505  Sat Sep 04, 5570  1577 87 year old female brought in by EMS for evaluation of back pain.   She is a rather poor historian.  Getting labs urinalysis and some imaging.  Likely discharge back to home if no significant etiology identified [MB]    Clinical Course User Index [MB] Hayden Rasmussen, MD   MDM Rules/Calculators/A&P  87 year old female who presents to the ED today via EMS from home with complaint of right lower back/right hip painwell as constipation.  Really unable to obtain additional information from patient given history of stroke expressive aphasia.  Family not at bedside however was able to contact them after multiple attempts via phone.  Does report patient has been complaining of right lower back/right hip pain for about 1 week, no recent falls.  Had a fall about 1 month ago, had been doing well since then until about 1 week ago.  Has also been constipated and not urinating as much is normal for about 1 week.  Daughter has tried daily MiraLAX as well as apple juice without relief.  Patient not currently on any narcotic pain medication to account for constipation.  Does have multiple abdominal surgeries in the past.  No vomiting at home.  On arrival to the ED today vitals are stable.  Patient needed to be rolled as she was unable to sit up due to the amount of pain she was in.  She does have some midline lumbar tenderness palpation as well as right paralumbar musculature with associated right hip tenderness palpation.  She has active bowel sounds in her abdomen and there is no obvious tenderness palpation however abdomen does appear slightly distended.  We will start with x-rays of the abdomen as well as the hip to assess for any sort of concern for SBO as well as occult fracture of the hip.  Will likely need CT scan ultimately however given age.  We will plan for labs and urinalysis at this time.  We will plan for bladder scan as well given daughter reports the patient has not been urinating as much.  CBC without leukocytosis. Hgb stable at 13.4. CMP with  glucose 190. Bicarb 27. No other electrolyte abnormalities.   Xrays negative at this time. Will proceed with CT A/P for constipation and no obvious findings on CT. Will also obtain CT L spine and CT R hip. Morphine given for pain.   CT scans without acute findings at this time. No fractures and no SBO. Incidental finding of left breast mass.  U/A without signs of infection. Bladder scan with 143 CC fluid.   Workup overall reassuring at this time. Have updated Daughter via phone who will come get patient. Advised on Miralax use several times a day to help with constipation until pt has a good BM. May be causing some of pt's back pain as well? Will provide lidoderm patches for back/hip and Tylenol PRN for pain. Fear narcotics would only worsen constipation. Daughter in agreement with plan and pt stable for discharge.   This note was prepared using Dragon voice recognition software and may include unintentional dictation errors due to the inherent limitations of voice recognition software.   Final Clinical Impression(s) / ED Diagnoses Final diagnoses:  Back pain  Constipation, unspecified constipation type  Hip pain  Left breast mass    Rx / DC Orders ED Discharge Orders          Ordered    lidocaine (LIDODERM) 5 %  Every 24 hours        09/03/20 1501             Discharge Instructions      Your labwork and imaging were all very reassuring. There were no signs of hip/back fractures or other abnormalities. The CT scan of the abdomen did not show any signs of severe constipation however mild constipation could  be contributing to the pain.   You can take Miralax up to 4x daily as needed until you have a good bowel movement. Then switch to once daily.  Increase the amount of water you drink to help soften your stools naturally.   Please pick up lidocaine patches to apply to the back/hip as needed for pain. You can also take Tylenol as needed for pain. Narcotic medication is not  indicated at this time and will only worsen constipation.   Please follow up with your PCP next week for further evaluation of your symptoms.   You should also follow up with [facility] for a mammogram/ultrasound of your left breast as a breast mass was picked up on the CT scan incidentally.   Return to the ED for any new/worsening symptoms       Eustaquio Maize, Hershal Coria 09/03/20 1505    Hayden Rasmussen, MD 09/03/20 [phone]

## 2020-09-17 ENCOUNTER — Encounter (HOSPITAL_COMMUNITY): Payer: Self-pay

## 2020-09-17 ENCOUNTER — Emergency Department (HOSPITAL_COMMUNITY)
Admission: EM | Admit: 2020-09-17 | Discharge: 2020-09-17 | Discharge status: Against Medical Advice | Payer: Medicare Other | Attending: Emergency Medicine | Admitting: Emergency Medicine

## 2020-09-17 ENCOUNTER — Other Ambulatory Visit: Payer: Self-pay

## 2020-09-17 ENCOUNTER — Emergency Department (HOSPITAL_COMMUNITY): Payer: Medicare Other

## 2020-09-17 DIAGNOSIS — Z79899 Other long term (current) drug therapy: Secondary | ICD-10-CM | POA: Diagnosis not present

## 2020-09-17 DIAGNOSIS — Z7984 Long term (current) use of oral hypoglycemic drugs: Secondary | ICD-10-CM | POA: Diagnosis not present

## 2020-09-17 DIAGNOSIS — Z951 Presence of aortocoronary bypass graft: Secondary | ICD-10-CM | POA: Diagnosis not present

## 2020-09-17 DIAGNOSIS — I2510 Atherosclerotic heart disease of native coronary artery without angina pectoris: Secondary | ICD-10-CM | POA: Diagnosis not present

## 2020-09-17 DIAGNOSIS — S4990XA Unspecified injury of shoulder and upper arm, unspecified arm, initial encounter: Secondary | ICD-10-CM

## 2020-09-17 DIAGNOSIS — W1839XA Other fall on same level, initial encounter: Secondary | ICD-10-CM | POA: Insufficient documentation

## 2020-09-17 DIAGNOSIS — I129 Hypertensive chronic kidney disease with stage 1 through stage 4 chronic kidney disease, or unspecified chronic kidney disease: Secondary | ICD-10-CM | POA: Diagnosis not present

## 2020-09-17 DIAGNOSIS — E1122 Type 2 diabetes mellitus with diabetic chronic kidney disease: Secondary | ICD-10-CM | POA: Insufficient documentation

## 2020-09-17 DIAGNOSIS — N183 Chronic kidney disease, stage 3 unspecified: Secondary | ICD-10-CM | POA: Diagnosis not present

## 2020-09-17 DIAGNOSIS — S4991XA Unspecified injury of right shoulder and upper arm, initial encounter: Secondary | ICD-10-CM | POA: Insufficient documentation

## 2020-09-17 DIAGNOSIS — E039 Hypothyroidism, unspecified: Secondary | ICD-10-CM | POA: Insufficient documentation
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
## 2020-09-17 NOTE — ED Provider Notes (Signed)
[facility] EMERGENCY DEPARTMENT Provider Note   CSN: [id number] Arrival date & time: 09/17/20  1525     History Chief Complaint  Patient presents with   Shoulder Pain    Leah Levine is a 87 y.o. female.  HPI    87 year old female with history of CAD, diabetes, hypertension, hyperlipidemia and stroke comes in with chief complaint of fall  She indicates to me that she had a fall yesterday.  She fell onto her right side and is complaining of right-sided shoulder pain.  She is right-handed dominant.  Patient also might have struck her head but is not having any severe headaches and she denies any nausea, vomiting, focal weakness or numbness.  She has not taken anything for pain.  She does not want anything for pain at this time. Past Medical History:  Diagnosis Date   Allergy    Anemia    CAD (coronary artery disease) 11-2012   CABG x 4 utilizing LIMA to LAD, SVG to Diagonal, SVG to Left Circumflex, and SVG to RCA   Diabetes mellitus type 2, controlled ([place])    GERD (gastroesophageal reflux disease)    Hearing loss    Hyperlipidemia    Hypertension    patient denies ever having hypertension   Hypothyroidism    Personal history of colonic polyps 11/20/2010   tubular adenomas   Stroke [facility])    Tremor    Vision abnormalities     Patient Active Problem List   Diagnosis Date Noted   Acute lower UTI 08/30/2018   Hyponatremia 08/30/2018   Acute UTI 08/30/2018   Dizziness 11/08/2016   Gait abnormality 11/08/2016   Aphasia    TIA (transient ischemic attack) 06/20/2016   Chest pain 03/06/2016   Arm pain, anterior, left 03/06/2016   Orthostatic hypotension 02/21/2016   Late effect of cerebrovascular accident 11/10/2015   Foraminal stenosis of cervical region 11/10/2015   Frequent falls 11/10/2015   Hypertriglyceridemia 09/05/2015   Neck muscle spasm 08/26/2015   CKD stage 3 due to type 2 diabetes mellitus ([place]) 08/26/2015   Intertrigo 03/14/2015   Stroke  (cerebrum) ([place]) 03/05/2015   Expressive aphasia 03/05/2015   Syncope 12/09/2013   Coronary artery disease 09/18/2013   Hypothyroidism 06/18/2013   S/P CABG x 4 12/01/2012   Vitamin B12 deficiency 12/05/2010   Personal history of colonic polyps 12/05/2010   Esophageal dysphagia 11/20/2010   Benign neoplasm of colon 11/20/2010   Gastritis, chronic 11/20/2010   Allergic rhinitis 07/03/2010   Tremor    GERD (gastroesophageal reflux disease)    Hyperlipidemia    Hypertension    Urinary incontinence    Type 2 diabetes mellitus with renal manifestations ([place])     Past Surgical History:  Procedure Laterality Date   ABDOMINAL HYSTERECTOMY     APPENDECTOMY     BACK SURGERY     CARPAL TUNNEL RELEASE     CHOLECYSTECTOMY     CORONARY ARTERY BYPASS GRAFT N/A 11/26/2012   Procedure: CORONARY ARTERY BYPASS GRAFTING (CABG);  Surgeon: Grace Isaac, MD;  Location: [place];  Service: Open Heart Surgery;  Laterality: N/A;   INTRAOPERATIVE TRANSESOPHAGEAL ECHOCARDIOGRAM N/A 11/26/2012   Procedure: INTRAOPERATIVE TRANSESOPHAGEAL ECHOCARDIOGRAM;  Surgeon: Grace Isaac, MD;  Location: [place];  Service: Open Heart Surgery;  Laterality: N/A;   KNEE ARTHROSCOPY     bilateral   LEFT HEART CATHETERIZATION WITH CORONARY ANGIOGRAM N/A 11/25/2012   Procedure: LEFT HEART CATHETERIZATION WITH CORONARY ANGIOGRAM;  Surgeon: Larey Dresser,  MD;  Location: [place] CATH LAB;  Service: Cardiovascular;  Laterality: N/A;   LOOP RECORDER IMPLANT N/A 12/10/2013   Procedure: LOOP RECORDER IMPLANT;  Surgeon: Coralyn Mark, MD;  Location: [place] CATH LAB;  Service: Cardiovascular;  Laterality: N/A;   NECK SURGERY       OB History   No obstetric history on file.     Family History  Problem Relation Age of Onset   Ovarian cancer Mother    Diabetes Father    Pneumonia Father    Diabetes Sister    Stroke Daughter    Heart attack Sister    Heart attack Brother     Social History   Tobacco Use   Smoking status: Never    Smokeless tobacco: Never  Vaping Use   Vaping Use: Never used  Substance Use Topics   Alcohol use: No   Drug use: No    Home Medications Prior to Admission medications   Medication Sig Start Date End Date Taking? Authorizing Provider  HYDROcodone-acetaminophen (NORCO/VICODIN) 5-325 MG tablet Take 2 tablets by mouth every 4 (four) hours as needed. 07/30/20   Fredia Sorrow, MD  levothyroxine (SYNTHROID, LEVOTHROID) 112 MCG tablet TAKE 1 TABLET ONCE DAILY BEFORE BREAKFAST Patient taking differently: Take 112 mcg by mouth daily before breakfast. 11/30/16   Timmothy Euler, MD  lidocaine (LIDODERM) 5 % Place 1 patch onto the skin daily. Remove & Discard patch within 12 hours or as directed by MD 09/03/20   Eustaquio Maize, PA-C  metFORMIN (GLUCOPHAGE) 1000 MG tablet TAKE (1) TABLET TWICE A DAY WITH MEALS (BREAKFAST AND SUPPER) Patient taking differently: Take 1,000 mg by mouth 2 (two) times daily with a meal. 05/21/17   Timmothy Euler, MD  mirtazapine (REMERON) 15 MG tablet Take 15 mg by mouth at bedtime. 07/01/20   [provider]  nitrofurantoin, macrocrystal-monohydrate, (MACROBID) 100 MG capsule Take 1 capsule (100 mg total) by mouth 2 (two) times daily. Patient not taking: Reported on 07/14/2020 02/18/20   Evalee Jefferson, PA-C  propranolol (INDERAL) 20 MG tablet Take 20 mg by mouth 2 (two) times daily. 01/15/20   [provider]  rosuvastatin (CRESTOR) 20 MG tablet TAKE 1 TABLET DAILY Patient not taking: Reported on 07/14/2020 05/21/17   Timmothy Euler, MD    Allergies    Celebrex [celecoxib], Fenofibrate, and Statins  Review of Systems   Review of Systems  Constitutional:  Positive for activity change.  Respiratory:  Negative for shortness of breath.   Cardiovascular:  Negative for chest pain.  Musculoskeletal:  Positive for arthralgias.  Neurological:  Negative for numbness and headaches.   Physical Exam Updated Vital Signs BP (!) 141/81   Pulse 84    Temp 98.9 F (37.2 C) (Oral)   Resp 16   Ht 5\' 2"  (1.575 m)   Wt 72.6 kg   SpO2 95%   BMI 29.26 kg/m   Physical Exam Vitals and nursing note reviewed.  Constitutional:      Appearance: She is well-developed.  HENT:     Head: Atraumatic.  Cardiovascular:     Rate and Rhythm: Normal rate.  Pulmonary:     Effort: Pulmonary effort is normal.  Musculoskeletal:        General: Swelling and tenderness present.     Cervical back: Normal range of motion and neck supple.     Comments: Patient is tenderness over the right shoulder anteriorly.  There is some edema and asymmetry compared to the left side.  Neurovascularly intact.  Skin:    General: Skin is warm and dry.  Neurological:     Mental Status: She is alert and oriented to person, place, and time.    ED Results / Procedures / Treatments   Labs (all labs ordered are listed, but only abnormal results are displayed) Labs Reviewed - No data to display  EKG None  Radiology DG Shoulder Right  Result Date: 09/17/2020 CLINICAL DATA:  Fall. Pain. not able to abduct arm for Y-view due to pain. EXAM: RIGHT SHOULDER - 2+ VIEW COMPARISON:  None. FINDINGS: Cortical irregularity/small buckle along the greater tuberosity. Query cortical irregularity of the inferior glenoid. Glenohumeral alignment unclear. Soft tissues are unremarkable. IMPRESSION: Consider CT noncontrast of the right shoulder for further evaluation. Cortical irregularity/small buckle along the greater tuberosity. Query cortical irregularity of the inferior glenoid. Glenohumeral alignment unclear. Electronically Signed   By: Iven Finn M.D.   On: 09/17/2020 19:21    Procedures Procedures   Medications Ordered in ED Medications - No data to display  ED Course  I have reviewed the triage vital signs and the nursing notes.  Pertinent labs & imaging results that were available during my care of the patient were reviewed by me and considered in my medical decision  making (see chart for details).  Clinical Course as of 09/17/20 1949  Sat Sep 17, 2020  1941 We were waiting for patient's x-rays to be read.  It was read at 721.  Went to reassess the patient, the nursing staff told me that the daughter had taken her home because she was upset about the wait time.  I called Ms. Vaughan Basta at [phone].  I wanted to inform her that the x-ray was indicating that we should consider getting CT scan.  However nobody picked up the phone and the voicemail was full.  Patient eloped, I did not have any AMA discussion. [AN]    Clinical Course User Index [AN] Varney Biles, MD   MDM Rules/Calculators/A&P                          87 year old female comes in a chief complaint of fall and resultant shoulder injury and pain. She is able to forward flex with pain.  She has some asymmetric swelling over the right shoulder and tenderness to palpation.  X-ray of the shoulder is ordered to evaluate for any humeral fracture or dislocation/separation.   Final Clinical Impression(s) / ED Diagnoses Final diagnoses:  Shoulder injury, initial encounter    Rx / DC Orders ED Discharge Orders     None        Varney Biles, MD 09/17/20 1950

## 2020-09-17 NOTE — ED Notes (Signed)
Patient transported to X-ray 

## 2020-09-17 NOTE — ED Notes (Signed)
ED Provider at bedside. 

## 2020-09-17 NOTE — ED Triage Notes (Signed)
Pt to er room number 17 via ems, per ems pt fell yesterday and has some shoulder pain.  PT  c/o R shoulder pain.

## 2020-11-20 ENCOUNTER — Encounter (HOSPITAL_COMMUNITY): Payer: Self-pay

## 2020-11-20 ENCOUNTER — Emergency Department (HOSPITAL_COMMUNITY): Payer: Medicare Other

## 2020-11-20 ENCOUNTER — Other Ambulatory Visit: Payer: Self-pay

## 2020-11-20 ENCOUNTER — Emergency Department (HOSPITAL_COMMUNITY)
Admission: EM | Admit: 2020-11-20 | Discharge: 2020-11-20 | Disposition: A | Discharge status: Home / Self Care | Payer: Medicare Other | Attending: Emergency Medicine | Admitting: Emergency Medicine

## 2020-11-20 DIAGNOSIS — E039 Hypothyroidism, unspecified: Secondary | ICD-10-CM | POA: Diagnosis not present

## 2020-11-20 DIAGNOSIS — B9689 Other specified bacterial agents as the cause of diseases classified elsewhere: Secondary | ICD-10-CM | POA: Insufficient documentation

## 2020-11-20 DIAGNOSIS — I129 Hypertensive chronic kidney disease with stage 1 through stage 4 chronic kidney disease, or unspecified chronic kidney disease: Secondary | ICD-10-CM | POA: Insufficient documentation

## 2020-11-20 DIAGNOSIS — R140 Abdominal distension (gaseous): Secondary | ICD-10-CM | POA: Insufficient documentation

## 2020-11-20 DIAGNOSIS — Z951 Presence of aortocoronary bypass graft: Secondary | ICD-10-CM | POA: Insufficient documentation

## 2020-11-20 DIAGNOSIS — I2510 Atherosclerotic heart disease of native coronary artery without angina pectoris: Secondary | ICD-10-CM | POA: Diagnosis not present

## 2020-11-20 DIAGNOSIS — D631 Anemia in chronic kidney disease: Secondary | ICD-10-CM | POA: Insufficient documentation

## 2020-11-20 DIAGNOSIS — R0602 Shortness of breath: Secondary | ICD-10-CM

## 2020-11-20 DIAGNOSIS — Z79899 Other long term (current) drug therapy: Secondary | ICD-10-CM | POA: Diagnosis not present

## 2020-11-20 DIAGNOSIS — N183 Chronic kidney disease, stage 3 unspecified: Secondary | ICD-10-CM | POA: Diagnosis not present

## 2020-11-20 DIAGNOSIS — E1129 Type 2 diabetes mellitus with other diabetic kidney complication: Secondary | ICD-10-CM | POA: Diagnosis not present

## 2020-11-20 DIAGNOSIS — Z20822 Contact with and (suspected) exposure to covid-19: Secondary | ICD-10-CM | POA: Insufficient documentation

## 2020-11-20 DIAGNOSIS — R4182 Altered mental status, unspecified: Secondary | ICD-10-CM | POA: Diagnosis not present

## 2020-11-20 DIAGNOSIS — Z7984 Long term (current) use of oral hypoglycemic drugs: Secondary | ICD-10-CM | POA: Diagnosis not present

## 2020-11-20 DIAGNOSIS — E1122 Type 2 diabetes mellitus with diabetic chronic kidney disease: Secondary | ICD-10-CM | POA: Insufficient documentation

## 2020-11-20 DIAGNOSIS — N3000 Acute cystitis without hematuria: Secondary | ICD-10-CM

## 2020-11-20 LAB — RESP PANEL BY RT-PCR (FLU A&B, COVID) ARPGX2
Influenza A by PCR: NEGATIVE
Influenza B by PCR: NEGATIVE
SARS Coronavirus 2 by RT PCR: NEGATIVE

## 2020-11-20 LAB — BASIC METABOLIC PANEL
Anion gap: 9 (ref 5–15)
BUN: 11 mg/dL (ref 8–23)
CO2: 25 mmol/L (ref 22–32)
Calcium: 9.1 mg/dL (ref 8.9–10.3)
Chloride: 103 mmol/L (ref 98–111)
Creatinine, Ser: 1.12 mg/dL — ABNORMAL HIGH (ref 0.44–1.00)
GFR, Estimated: 48 mL/min — ABNORMAL LOW (ref >60)
Glucose, Bld: 173 mg/dL — ABNORMAL HIGH (ref 70–99)
Potassium: 3.8 mmol/L (ref 3.5–5.1)
Sodium: 137 mmol/L (ref 135–145)

## 2020-11-20 LAB — URINALYSIS, ROUTINE W REFLEX MICROSCOPIC
Bilirubin Urine: NEGATIVE
Glucose, UA: NEGATIVE mg/dL
Hgb urine dipstick: NEGATIVE
Ketones, ur: NEGATIVE mg/dL
Nitrite: NEGATIVE
Protein, ur: NEGATIVE mg/dL
Specific Gravity, Urine: 1.009 (ref 1.005–1.030)
WBC, UA: >50 WBC/hpf — ABNORMAL HIGH (ref 0–5)
pH: 5 (ref 5.0–8.0)

## 2020-11-20 LAB — CBC
HCT: 48 % — ABNORMAL HIGH (ref 36.0–46.0)
Hemoglobin: 15.1 g/dL — ABNORMAL HIGH (ref 12.0–15.0)
MCH: 26 pg (ref 26.0–34.0)
MCHC: 31.5 g/dL (ref 30.0–36.0)
MCV: 82.6 fL (ref 80.0–100.0)
Platelets: 330 10*3/uL (ref 150–400)
RBC: 5.81 MIL/uL — ABNORMAL HIGH (ref 3.87–5.11)
RDW: 14.9 % (ref 11.5–15.5)
WBC: 11.5 10*3/uL — ABNORMAL HIGH (ref 4.0–10.5)
nRBC: 0 % (ref 0.0–0.2)

## 2020-11-20 LAB — HEPATIC FUNCTION PANEL
ALT: 9 U/L (ref 0–44)
AST: 14 U/L — ABNORMAL LOW (ref 15–41)
Albumin: 3.4 g/dL — ABNORMAL LOW (ref 3.5–5.0)
Alkaline Phosphatase: 65 U/L (ref 38–126)
Bilirubin, Direct: 0.2 mg/dL (ref 0.0–0.2)
Indirect Bilirubin: 0.4 mg/dL (ref 0.3–0.9)
Total Bilirubin: 0.6 mg/dL (ref 0.3–1.2)
Total Protein: 6.9 g/dL (ref 6.5–8.1)

## 2020-11-20 LAB — LACTIC ACID, PLASMA: Lactic Acid, Venous: 1.7 mmol/L (ref 0.5–1.9)

## 2020-11-20 LAB — LIPASE, BLOOD: Lipase: 28 U/L (ref 11–51)

## 2020-11-20 LAB — TROPONIN I (HIGH SENSITIVITY): Troponin I (High Sensitivity): 7 ng/L (ref <18)

## 2020-11-20 MED ORDER — LACTATED RINGERS IV BOLUS: 500.0000 mL | Freq: Once | INTRAVENOUS | Status: DC

## 2020-11-20 MED ORDER — CEPHALEXIN 500 MG PO CAPS: 500.0000 mg | ORAL_CAPSULE | Freq: Four times a day (QID) | ORAL | 0 refills | Status: AC

## 2020-11-20 MED ORDER — CEPHALEXIN 250 MG PO CAPS
500.0000 mg | ORAL_CAPSULE | Freq: Once | ORAL | Status: AC
  Administered 2020-11-20: 500 mg via ORAL
  Filled 2020-11-20: qty 2

## 2020-11-20 NOTE — ED Notes (Signed)
Patient refusing IV at this time. Dr. Alvino Chapel notified.

## 2020-11-20 NOTE — ED Provider Notes (Addendum)
Emergency Medicine Provider Triage Evaluation Note  Leah Levine , a 87 y.o. female  was evaluated in triage.  Brought in by her daughter today for evaluation of generalized weakness for the past 3 days.  Patient normally has assistance with ADLs including getting to the bathroom, daughter reports patient seems more off balance than normal.  Family has needed to assist the patient for with transferring and patient seems generally more weak.  Patient also reporting some increased shortness of breath over the past 3 days.  Family reports history of similar in the past when patient had urinary tract infection and dehydration.  Review of Systems  Positive: Generalized weakness, shortness of breath Negative: Fall, injury, fever, chest pain, cough, abdominal pain numbness/weakness, new facial droop  Physical Exam  BP (!) 155/111 (BP Location: Right Arm)   Pulse 84   Temp 98.6 F (37 C) (Oral)   Resp 16   SpO2 98%  Gen:   Awake, no distress   Resp:  Normal effort, lungs clear to auscultation bilaterally MSK:   Week, but equal grip strength bilaterally Other:  Abdomen soft nontender without peritoneal signs.  Heart regular rate and rhythm.   Medical Decision Making  Medically screening exam initiated at 11:49 AM.  Appropriate orders placed.  Leah Levine was informed that the remainder of the evaluation will be completed by another provider, this initial triage assessment does not replace that evaluation, and the importance of remaining in the ED until their evaluation is complete.   Note: Portions of this report may have been transcribed using voice recognition software. Every effort was made to ensure accuracy; however, inadvertent computerized transcription errors may still be present.    [name], PA-C 11/20/20 1152    [name] 11/20/20 1154    [name], MD 11/20/20 [phone]

## 2020-11-20 NOTE — Discharge Instructions (Addendum)
Urine cultures been sent.  You will be notified if it does not match up with the antibiotics were given.  Try and keep her hydrated.  Return if she is still not able to eat and drink.

## 2020-11-20 NOTE — ED Triage Notes (Signed)
Pt accompanied by daughter who reports pt c.o difficulty breathing for the past 2 days along with pt not eating and drinking as normal. Pt denies any pain. No n/v observed. Pt alert but confused

## 2020-11-20 NOTE — ED Provider Notes (Signed)
[facility] EMERGENCY DEPARTMENT Provider Note   CSN: [id number] Arrival date & time: 11/20/20  1024     History Chief Complaint  Patient presents with   Shortness of Breath    Leah Levine is a 87 y.o. female.   Shortness of Breath Level 5 caveat due to dementia. Patient presents with reported shortness of breath and decreased oral intake.  Is been going for around 3 days.  Patient's daughter states she is not exactly sure the patient is not eating but feels may be because she is short of breath.  Patient states she feels that she has a tremor in both her hands.  Denies nausea or vomiting.  Denies abdominal pain.  Denies cough.  Worried patient to be dehydrated.  Also possible urinary tract infection.  States she was treated a couple weeks ago for UTI.    Past Medical History:  Diagnosis Date   Allergy    Anemia    CAD (coronary artery disease) 11-2012   CABG x 4 utilizing LIMA to LAD, SVG to Diagonal, SVG to Left Circumflex, and SVG to RCA   Diabetes mellitus type 2, controlled ([place])    GERD (gastroesophageal reflux disease)    Hearing loss    Hyperlipidemia    Hypertension    patient denies ever having hypertension   Hypothyroidism    Personal history of colonic polyps 11/20/2010   tubular adenomas   Stroke [facility])    Tremor    Vision abnormalities     Patient Active Problem List   Diagnosis Date Noted   Acute lower UTI 08/30/2018   Hyponatremia 08/30/2018   Acute UTI 08/30/2018   Dizziness 11/08/2016   Gait abnormality 11/08/2016   Aphasia    TIA (transient ischemic attack) 06/20/2016   Chest pain 03/06/2016   Arm pain, anterior, left 03/06/2016   Orthostatic hypotension 02/21/2016   Late effect of cerebrovascular accident 11/10/2015   Foraminal stenosis of cervical region 11/10/2015   Frequent falls 11/10/2015   Hypertriglyceridemia 09/05/2015   Neck muscle spasm 08/26/2015   CKD stage 3 due to type 2 diabetes mellitus ([place])  08/26/2015   Intertrigo 03/14/2015   Stroke (cerebrum) ([place]) 03/05/2015   Expressive aphasia 03/05/2015   Syncope 12/09/2013   Coronary artery disease 09/18/2013   Hypothyroidism 06/18/2013   S/P CABG x 4 12/01/2012   Vitamin B12 deficiency 12/05/2010   Personal history of colonic polyps 12/05/2010   Esophageal dysphagia 11/20/2010   Benign neoplasm of colon 11/20/2010   Gastritis, chronic 11/20/2010   Allergic rhinitis 07/03/2010   Tremor    GERD (gastroesophageal reflux disease)    Hyperlipidemia    Hypertension    Urinary incontinence    Type 2 diabetes mellitus with renal manifestations ([place])     Past Surgical History:  Procedure Laterality Date   ABDOMINAL HYSTERECTOMY     APPENDECTOMY     BACK SURGERY     CARPAL TUNNEL RELEASE     CHOLECYSTECTOMY     CORONARY ARTERY BYPASS GRAFT N/A 11/26/2012   Procedure: CORONARY ARTERY BYPASS GRAFTING (CABG);  Surgeon: Grace Isaac, MD;  Location: [place];  Service: Open Heart Surgery;  Laterality: N/A;   INTRAOPERATIVE TRANSESOPHAGEAL ECHOCARDIOGRAM N/A 11/26/2012   Procedure: INTRAOPERATIVE TRANSESOPHAGEAL ECHOCARDIOGRAM;  Surgeon: Grace Isaac, MD;  Location: [place];  Service: Open Heart Surgery;  Laterality: N/A;   KNEE ARTHROSCOPY     bilateral   LEFT HEART CATHETERIZATION WITH CORONARY ANGIOGRAM N/A 11/25/2012  Procedure: LEFT HEART CATHETERIZATION WITH CORONARY ANGIOGRAM;  Surgeon: Larey Dresser, MD;  Location: [facility] C[facility] LAB;  Service: Cardiovascular;  Laterality: N/A;   LOOP RECORDER IMPLANT N/A 12/10/2013   Procedure: LOOP RECORDER IMPLANT;  Surgeon: Coralyn Mark, MD;  Location: [place] CATH LAB;  Service: Cardiovascular;  Laterality: N/A;   NECK SURGERY       OB History   No obstetric history on file.     Family History  Problem Relation Age of Onset   Ovarian cancer Mother    Diabetes Father    Pneumonia Father    Diabetes Sister    Stroke Daughter    Heart attack Sister    Heart attack Brother     Social  History   Tobacco Use   Smoking status: Never   Smokeless tobacco: Never  Vaping Use   Vaping Use: Never used  Substance Use Topics   Alcohol use: No   Drug use: No    Home Medications Prior to Admission medications   Medication Sig Start Date End Date Taking? Authorizing Provider  cephALEXin (KEFLEX) 500 MG capsule Take 1 capsule (500 mg total) by mouth 4 (four) times daily. 11/20/20  Yes Davonna Belling, MD  HYDROcodone-acetaminophen (NORCO/VICODIN) 5-325 MG tablet Take 2 tablets by mouth every 4 (four) hours as needed. 07/30/20   Fredia Sorrow, MD  levothyroxine (SYNTHROID, LEVOTHROID) 112 MCG tablet TAKE 1 TABLET ONCE DAILY BEFORE BREAKFAST Patient taking differently: Take 112 mcg by mouth daily before breakfast. 11/30/16   Timmothy Euler, MD  lidocaine (LIDODERM) 5 % Place 1 patch onto the skin daily. Remove & Discard patch within 12 hours or as directed by MD 09/03/20   Eustaquio Maize, PA-C  metFORMIN (GLUCOPHAGE) 1000 MG tablet TAKE (1) TABLET TWICE A DAY WITH MEALS (BREAKFAST AND SUPPER) Patient taking differently: Take 1,000 mg by mouth 2 (two) times daily with a meal. 05/21/17   Timmothy Euler, MD  mirtazapine (REMERON) 15 MG tablet Take 15 mg by mouth at bedtime. 07/01/20   [provider]  nitrofurantoin, macrocrystal-monohydrate, (MACROBID) 100 MG capsule Take 1 capsule (100 mg total) by mouth 2 (two) times daily. Patient not taking: Reported on 07/14/2020 02/18/20   Evalee Jefferson, PA-C  propranolol (INDERAL) 20 MG tablet Take 20 mg by mouth 2 (two) times daily. 01/15/20   [provider]  rosuvastatin (CRESTOR) 20 MG tablet TAKE 1 TABLET DAILY Patient not taking: Reported on 07/14/2020 05/21/17   Timmothy Euler, MD    Allergies    Celebrex [celecoxib], Fenofibrate, and Statins  Review of Systems   Review of Systems  Unable to perform ROS: Dementia  Respiratory:  Positive for shortness of breath.    Physical Exam Updated Vital Signs BP (!)  158/69   Pulse 69   Temp 98.6 F (37 C) (Oral)   Resp (!) 21   SpO2 100%   Physical Exam Vitals reviewed.  HENT:     Head: Atraumatic.  Cardiovascular:     Rate and Rhythm: Normal rate.  Pulmonary:     Breath sounds: No wheezing or rhonchi.  Chest:     Chest wall: No tenderness.  Abdominal:     Comments: Mild gaseous distention of abdomen.  No rebound or guarding.  No hernia palpated.  Musculoskeletal:     Cervical back: Neck supple.     Left lower leg: No tenderness.  Skin:    General: Skin is warm.  Neurological:     Mental Status:  She is alert.     Comments: Awake and pleasant, but some confusion.  Mild tremor in both hands.    ED Results / Procedures / Treatments   Labs (all labs ordered are listed, but only abnormal results are displayed) Labs Reviewed  BASIC METABOLIC PANEL - Abnormal; Notable for the following components:      Result Value   Glucose, Bld 173 (*)    Creatinine, Ser 1.12 (*)    GFR, Estimated 48 (*)    All other components within normal limits  CBC - Abnormal; Notable for the following components:   WBC 11.5 (*)    RBC 5.81 (*)    Hemoglobin 15.1 (*)    HCT 48.0 (*)    All other components within normal limits  URINALYSIS, ROUTINE W REFLEX MICROSCOPIC - Abnormal; Notable for the following components:   APPearance CLOUDY (*)    Leukocytes,Ua LARGE (*)    WBC, UA >50 (*)    Bacteria, UA RARE (*)    All other components within normal limits  HEPATIC FUNCTION PANEL - Abnormal; Notable for the following components:   Albumin 3.4 (*)    AST 14 (*)    All other components within normal limits  RESP PANEL BY RT-PCR (FLU A&B, COVID) ARPGX2  URINE CULTURE  LIPASE, BLOOD  LACTIC ACID, PLASMA  TROPONIN I (HIGH SENSITIVITY)    EKG None  Radiology DG Chest 1 View  Result Date: 11/20/2020 CLINICAL DATA:  Shortness of breath EXAM: CHEST  1 VIEW COMPARISON:  Aug 13, 2019 FINDINGS: The cardiomediastinal silhouette is unchanged in contour.Status  post median sternotomy and CABG. Cardiac loop recorder. Unchanged nodular opacity of the LEFT mid lung consistent with a granuloma. No pleural effusion. No pneumothorax. No acute pleuroparenchymal abnormality. Visualized abdomen is unremarkable. Multilevel degenerative changes of the thoracic spine. IMPRESSION: No acute cardiopulmonary abnormality. Electronically Signed   By: Valentino Saxon M.D.   On: 11/20/2020 12:50   CT HEAD WO CONTRAST (5MM)  Result Date: 11/20/2020 CLINICAL DATA:  Mental status change, unknown cause EXAM: CT HEAD WITHOUT CONTRAST TECHNIQUE: Contiguous axial images were obtained from the base of the skull through the vertex without intravenous contrast. COMPARISON:  Jul 30, 2020 FINDINGS: Brain: No evidence of acute infarction, hemorrhage, hydrocephalus, extra-axial collection or mass lesion/mass effect. Global parenchymal volume loss. Periventricular white matter hypodensities consistent with sequela of chronic microvascular ischemic disease. Remote bilateral basal ganglia lacunar infarctions. Vascular: Vascular calcifications. Skull: Normal. Negative for fracture or focal lesion. Sinuses/Orbits: No acute finding. Other: LEFT ear cerumen. IMPRESSION: No acute intracranial abnormality. Electronically Signed   By: Valentino Saxon M.D.   On: 11/20/2020 12:54    Procedures Procedures   Medications Ordered in ED Medications  lactated ringers bolus 500 mL (500 mLs Intravenous Not Given 11/20/20 1913)  cephALEXin (KEFLEX) capsule 500 mg (500 mg Oral Given 11/20/20 1916)    ED Course  I have reviewed the triage vital signs and the nursing notes.  Pertinent labs & imaging results that were available during my care of the patient were reviewed by me and considered in my medical decision making (see chart for details).    MDM Rules/Calculators/A&P                           Patient presented with daughter.  Some shortness of breath decreased oral intake.  Increased confusion.   Mild dehydration on labs but patient refused IV.  Urine does appear  to show infection.  Chest x-ray reassuring.  Not hypoxic.  Discussed with patient's daughter.  Patient and her daughter would rather be at home.  Reviewed previous cultures.  Will start Keflex.  Does have some decreased oral intake but I think it is reasonable to give a trial at home.  Will discharge Final Clinical Impression(s) / ED Diagnoses Final diagnoses:  Acute cystitis without hematuria    Rx / DC Orders ED Discharge Orders          Ordered    cephALEXin (KEFLEX) 500 MG capsule  4 times daily        11/20/20 1926             Davonna Belling, MD 11/20/20 1929

## 2020-11-24 LAB — URINE CULTURE: Culture: >=100000 — AB

## 2020-11-25 ENCOUNTER — Telehealth: Payer: Self-pay

## 2020-11-25 NOTE — Telephone Encounter (Signed)
Post ED Visit - Positive Culture Follow-up  Culture report reviewed by antimicrobial stewardship pharmacist: [place] Team '[x]'$  [facility], Pharm.D. '[]'$  Heide Guile, Pharm.D., BCPS AQ-ID '[]'$  Parks Neptune, Pharm.D., BCPS '[]'$  Alycia Rossetti, Pharm.D., BCPS '[]'$  [place], [place].D., BCPS, AAHIVP '[]'$  Legrand Como, Pharm.D., BCPS, AAHIVP '[]'$  Salome Arnt, PharmD, BCPS '[]'$  Johnnette Gourd, PharmD, BCPS '[]'$  Hughes Better, PharmD, BCPS '[]'$  Leeroy Cha, PharmD '[]'$  Laqueta Linden, PharmD, BCPS '[]'$  Albertina Parr, PharmD  [place] Team '[]'$  Leodis Sias, PharmD '[]'$  Lindell Spar, PharmD '[]'$  Royetta Asal, PharmD '[]'$  Graylin Shiver, Rph '[]'$  Rema Fendt) Glennon Mac, PharmD '[]'$  Arlyn Dunning, PharmD '[]'$  Netta Cedars, PharmD '[]'$  Dia Sitter, PharmD '[]'$  Leone Haven, PharmD '[]'$  Gretta Arab, PharmD '[]'$  Theodis Shove, PharmD '[]'$  Peggyann Juba, PharmD '[]'$  Reuel Boom, PharmD   Positive urine culture Treated with Cephalexin, organism sensitive to the same and no further patient follow-up is required at this time.  Glennon [place] 11/25/2020, 11:26 AM
